# Patient Record
Sex: Male | Born: 1955
Health system: Southern US, Community
[De-identification: ages and names within clinical notes are randomized; demographics above are authoritative.]

## PROBLEM LIST (undated history)

## (undated) DIAGNOSIS — G4730 Sleep apnea, unspecified: Secondary | ICD-10-CM

## (undated) DIAGNOSIS — N529 Male erectile dysfunction, unspecified: Secondary | ICD-10-CM

## (undated) DIAGNOSIS — F419 Anxiety disorder, unspecified: Secondary | ICD-10-CM

## (undated) DIAGNOSIS — I7090 Unspecified atherosclerosis: Secondary | ICD-10-CM

## (undated) DIAGNOSIS — E291 Testicular hypofunction: Secondary | ICD-10-CM

## (undated) DIAGNOSIS — E785 Hyperlipidemia, unspecified: Secondary | ICD-10-CM

## (undated) DIAGNOSIS — E559 Vitamin D deficiency, unspecified: Secondary | ICD-10-CM

## (undated) DIAGNOSIS — G4733 Obstructive sleep apnea (adult) (pediatric): Secondary | ICD-10-CM

## (undated) DIAGNOSIS — E237 Disorder of pituitary gland, unspecified: Secondary | ICD-10-CM

## (undated) DIAGNOSIS — M7989 Other specified soft tissue disorders: Secondary | ICD-10-CM

## (undated) DIAGNOSIS — E119 Type 2 diabetes mellitus without complications: Secondary | ICD-10-CM

## (undated) DIAGNOSIS — M479 Spondylosis, unspecified: Secondary | ICD-10-CM

## (undated) DIAGNOSIS — M549 Dorsalgia, unspecified: Secondary | ICD-10-CM

## (undated) DIAGNOSIS — M5430 Sciatica, unspecified side: Secondary | ICD-10-CM

## (undated) DIAGNOSIS — F329 Major depressive disorder, single episode, unspecified: Secondary | ICD-10-CM

## (undated) DIAGNOSIS — I2510 Atherosclerotic heart disease of native coronary artery without angina pectoris: Secondary | ICD-10-CM

## (undated) DIAGNOSIS — M2550 Pain in unspecified joint: Secondary | ICD-10-CM

## (undated) DIAGNOSIS — Z86718 Personal history of other venous thrombosis and embolism: Secondary | ICD-10-CM

## (undated) DIAGNOSIS — K5900 Constipation, unspecified: Secondary | ICD-10-CM

## (undated) DIAGNOSIS — F32A Depression, unspecified: Secondary | ICD-10-CM

## (undated) DIAGNOSIS — R12 Heartburn: Secondary | ICD-10-CM

## (undated) DIAGNOSIS — J189 Pneumonia, unspecified organism: Secondary | ICD-10-CM

## (undated) DIAGNOSIS — M5412 Radiculopathy, cervical region: Secondary | ICD-10-CM

## (undated) DIAGNOSIS — E669 Obesity, unspecified: Secondary | ICD-10-CM

## (undated) DIAGNOSIS — M1990 Unspecified osteoarthritis, unspecified site: Secondary | ICD-10-CM

## (undated) DIAGNOSIS — Z8701 Personal history of pneumonia (recurrent): Secondary | ICD-10-CM

## (undated) DIAGNOSIS — R0602 Shortness of breath: Secondary | ICD-10-CM

## (undated) DIAGNOSIS — I219 Acute myocardial infarction, unspecified: Secondary | ICD-10-CM

## (undated) DIAGNOSIS — G549 Nerve root and plexus disorder, unspecified: Secondary | ICD-10-CM

## (undated) DIAGNOSIS — K219 Gastro-esophageal reflux disease without esophagitis: Secondary | ICD-10-CM

## (undated) DIAGNOSIS — E230 Hypopituitarism: Secondary | ICD-10-CM

## (undated) DIAGNOSIS — M4800 Spinal stenosis, site unspecified: Secondary | ICD-10-CM

## (undated) HISTORY — DX: Sleep apnea, unspecified: G47.30

## (undated) HISTORY — PX: WISDOM TOOTH EXTRACTION: SHX21

## (undated) HISTORY — DX: Major depressive disorder, single episode, unspecified: F32.9

## (undated) HISTORY — DX: Personal history of other venous thrombosis and embolism: Z86.718

## (undated) HISTORY — DX: Constipation, unspecified: K59.00

## (undated) HISTORY — DX: Hyperlipidemia, unspecified: E78.5

## (undated) HISTORY — DX: Hypopituitarism: E23.0

## (undated) HISTORY — PX: TONSILLECTOMY: SUR1361

## (undated) HISTORY — DX: Shortness of breath: R06.02

## (undated) HISTORY — DX: Unspecified atherosclerosis: I70.90

## (undated) HISTORY — DX: Heartburn: R12

## (undated) HISTORY — DX: Pain in unspecified joint: M25.50

## (undated) HISTORY — DX: Obstructive sleep apnea (adult) (pediatric): G47.33

## (undated) HISTORY — DX: Atherosclerotic heart disease of native coronary artery without angina pectoris: I25.10

## (undated) HISTORY — DX: Personal history of pneumonia (recurrent): Z87.01

## (undated) HISTORY — DX: Male erectile dysfunction, unspecified: N52.9

## (undated) HISTORY — DX: Testicular hypofunction: E29.1

## (undated) HISTORY — DX: Unspecified osteoarthritis, unspecified site: M19.90

## (undated) HISTORY — DX: Dorsalgia, unspecified: M54.9

## (undated) HISTORY — DX: Type 2 diabetes mellitus without complications: E11.9

## (undated) HISTORY — DX: Spinal stenosis, site unspecified: M48.00

## (undated) HISTORY — DX: Depression, unspecified: F32.A

## (undated) HISTORY — DX: Other specified soft tissue disorders: M79.89

## (undated) HISTORY — DX: Vitamin D deficiency, unspecified: E55.9

## (undated) HISTORY — DX: Anxiety disorder, unspecified: F41.9

---

## 2012-04-13 ENCOUNTER — Ambulatory Visit (HOSPITAL_COMMUNITY): Payer: Self-pay | Admitting: Psychology

## 2012-04-13 ENCOUNTER — Encounter (HOSPITAL_COMMUNITY): Payer: Self-pay | Admitting: Psychology

## 2012-04-13 ENCOUNTER — Ambulatory Visit (INDEPENDENT_AMBULATORY_CARE_PROVIDER_SITE_OTHER): Payer: BC Managed Care – PPO | Admitting: Psychology

## 2012-04-13 DIAGNOSIS — F329 Major depressive disorder, single episode, unspecified: Secondary | ICD-10-CM

## 2012-04-13 DIAGNOSIS — F411 Generalized anxiety disorder: Secondary | ICD-10-CM

## 2012-04-13 NOTE — Progress Notes (Signed)
Patient:   Roger Guerra   DOB:   [birth year]  MR Number:  [id number]  Location:  [facility] [address] [place] [address] Dept: [phone]           Date of Service:   04/13/2012  Start Time:   3 PM End Time:   4:15 PM  Provider/Observer:  Hershal Coria PSYD       Billing Code/Service: [id number]  Chief Complaint:     Chief Complaint  Patient presents with  . Anxiety  . Agitation  . Depression  . Stress    Reason for Service:  The patient was self-referred after developing increasing symptoms of anxiety and depression. The patient had been seen in the past when he was living in [place]. He has uncontrolled diabetes, disorder and eating, and generalized anxiety. He has recently been placed on insulin which really bothered him indication of worsening of his type 2 diabetes. The patient also has treatable sleep apneas that with a CPAP device. The patient reports he has been experiencing low energy and other symptoms of depression over the past few months. He reports that in recent triggers was within his ex-wife remarried he brought up a lot of of Ewing's as they have very bad divorce and he had a lot of regrets around breakup of her marriage. The patient has been down here living by himself for quite some time and his weight is going up and down significantly. The patient also was admitted alcoholic but indurated to a 29 years ago and has continued to be an active participant has not had any alcohol for 20 years. The patient describes  Current Status:  The patient describes moderate to significant symptoms of depression, anxiety, appetite changes, and lonely.  Reliability of Information: The information was provided by the patient himself but does appear to be valid there did not appear to be secondary factors or secondary gain contributing her motivating him to provide false  information.  Behavioral Observation: Roger Guerra  presents as a 56 y.o.-year-old Right Caucasian Male who appeared his stated age. his dress was Appropriate and he was Well Groomed and his manners were Appropriate to the situation.  There were not any physical disabilities noted.  he displayed an appropriate level of cooperation and motivation.    Interactions:    Active   Attention:   within normal limits  Memory:   within normal limits  Visuo-spatial:   within normal limits  Speech (Volume):  normal  Speech:   normal pitch and normal volume  Thought Process:  Coherent  Though Content:  WNL  Orientation:   person, place, time/date, situation, day of week and month of year  Judgment:   Good  Planning:   Good  Affect:    Anxious  Mood:    Depressed  Insight:   Good  Intelligence:   high  Marital Status/Living: The patient is divorced. He lives by himself.  Substance Use:  No concerns of substance abuse are reported.  the patient reports that he does have a history of significant alcohol abuse but has been involved in AA for 29 years and has been completely alcohol free. It is very possible that some of his problems with weight are related to eating disorders/abuse in the that may be another form of addictions last addictive behaviors.  Education:   College  Medical History:   Past Medical History  Diagnosis Date  .  Anxiety disorder   . Depression   . H/O blood clots   . High cholesterol   . Diabetes mellitus type II   . Sleep apnea   . Obstructive sleep apnea         Outpatient Encounter Prescriptions as of 04/13/2012  Medication Sig Dispense Refill  . testosterone (ANDROGEL) 50 MG/5GM GEL Place 5 g onto the skin daily.              Sexual History:   History  Sexual Activity  . Sexually Active: No    Abuse/Trauma History: The patient did have a traumatic event related to the divorce from his wife.  Psychiatric History:  The patient has been  treated for issues of depression and anxiety as well as feeding issues and has been active participant in AA for 29 years.  Family Med/Psych History: History reviewed. No pertinent family history.  Risk of Suicide/Violence: virtually non-existent   Impression/DX:  At this point, the patient does have a long history of treatment for anxiety and depression this with Lexapro in the past. He had a very stressful and bad divorce and was seen on an outpatient basis back in 2007. He admits to having suicidal ideation as far back as 2002. I do think he meets the criterion for a anxiety disorder as well as a depressive disorder we will continue look at the issues of further disc make sure that is not of bipolar disorder or a unipolar disorder.  Disposition/Plan:  We will set the patient up for individual psychotherapeutic interventions but also remained observed and whether there may be any need for psychotropic interventions.  Diagnosis:    Axis I:   1. Generalized anxiety disorder   2. Depressive disorder         Axis II: Deferred       Axis IV:  other psychosocial or environmental problems          Axis V:  51-60 moderate symptoms

## 2012-05-02 ENCOUNTER — Ambulatory Visit (HOSPITAL_COMMUNITY): Payer: Self-pay | Admitting: Psychology

## 2013-09-27 HISTORY — PX: BLADDER SURGERY: SHX569

## 2015-06-25 ENCOUNTER — Other Ambulatory Visit: Payer: Self-pay | Admitting: "Endocrinology

## 2015-07-15 LAB — HEMOGLOBIN A1C: HEMOGLOBIN A1C: 6 % (ref 4.0–6.0)

## 2015-07-23 ENCOUNTER — Encounter: Payer: Self-pay | Admitting: *Deleted

## 2015-07-24 ENCOUNTER — Encounter: Payer: Self-pay | Admitting: "Endocrinology

## 2015-07-24 ENCOUNTER — Ambulatory Visit (INDEPENDENT_AMBULATORY_CARE_PROVIDER_SITE_OTHER): Payer: BC Managed Care – PPO | Admitting: "Endocrinology

## 2015-07-24 VITALS — Ht 70.0 in

## 2015-07-24 DIAGNOSIS — E785 Hyperlipidemia, unspecified: Secondary | ICD-10-CM | POA: Insufficient documentation

## 2015-07-24 DIAGNOSIS — IMO0001 Reserved for inherently not codable concepts without codable children: Secondary | ICD-10-CM | POA: Insufficient documentation

## 2015-07-24 DIAGNOSIS — E1165 Type 2 diabetes mellitus with hyperglycemia: Secondary | ICD-10-CM | POA: Diagnosis not present

## 2015-07-24 DIAGNOSIS — E291 Testicular hypofunction: Secondary | ICD-10-CM

## 2015-07-24 MED ORDER — ROSUVASTATIN CALCIUM 5 MG PO TABS: 5.0000 mg | ORAL_TABLET | Freq: Every day | ORAL | Status: DC

## 2015-07-24 NOTE — Progress Notes (Signed)
Subjective:    Patient ID: Roger Guerra, male    DOB: 1956-07-07,    Past Medical History  Diagnosis Date  . Anxiety disorder   . Depression   . H/O blood clots   . High cholesterol   . Diabetes mellitus type II   . Sleep apnea   . Obstructive sleep apnea    Past Surgical History  Procedure Laterality Date  . Tonsillectomy     Social History   Social History  . Marital Status: Unknown    Spouse Name: N/A  . Number of Children: N/A  . Years of Education: N/A   Social History Main Topics  . Smoking status: Former Smoker    Types: Cigarettes  . Smokeless tobacco: Never Used  . Alcohol Use: No  . Drug Use: No  . Sexual Activity: No   Other Topics Concern  . None   Social History Narrative   Outpatient Encounter Prescriptions as of 07/24/2015  Medication Sig  . aspirin 81 MG chewable tablet Chew 81 mg by mouth daily.  . Dulaglutide (TRULICITY) 3.41 DQ/2.2WL SOPN Inject into the skin once a week.  Marland Kitchen ibuprofen (ADVIL,MOTRIN) 800 MG tablet Take 800 mg by mouth every 8 (eight) hours as needed.  . metFORMIN (GLUCOPHAGE) 1000 MG tablet Take 1,000 mg by mouth 2 (two) times daily with a meal.  . tadalafil (CIALIS) 10 MG tablet Take 10 mg by mouth daily as needed for erectile dysfunction.  . Testosterone Cypionate & Prop 200-20 MG/ML SOLN Inject 100 mg into the muscle as directed.  Every 2 weeks   . insulin detemir (LEVEMIR) 100 UNIT/ML injection Inject into the skin at bedtime.  . rosuvastatin (CRESTOR) 5 MG tablet Take 1 tablet (5 mg total) by mouth at bedtime.  . tamsulosin (FLOMAX) 0.4 MG CAPS capsule Take 0.4 mg by mouth daily.  . [DISCONTINUED] pravastatin (PRAVACHOL) 20 MG tablet Take 20 mg by mouth daily.  . [DISCONTINUED] TRULICITY 1.5 NL/8.9QJ SOPN INJECT 1.5MG S SUBCUTANEOUSLY EVERY WEEK   No facility-administered encounter medications on file as of 07/24/2015.   ALLERGIES: No Known Allergies VACCINATION STATUS:  There is no immunization history on file  for this patient.  Diabetes He presents for his follow-up diabetic visit. He has type 2 diabetes mellitus. Onset time: he was diagnosed at approximate age of 89 yreas. His disease course has been improving. There are no hypoglycemic associated symptoms. Pertinent negatives for hypoglycemia include no confusion, headaches, pallor or seizures. There are no diabetic associated symptoms. Pertinent negatives for diabetes include no chest pain, no fatigue, no polydipsia, no polyphagia, no polyuria and no weakness. There are no hypoglycemic complications. Symptoms are improving. There are no diabetic complications. Risk factors for coronary artery disease include family history, diabetes mellitus, hypertension, male sex and obesity. Current diabetic treatment includes oral agent (monotherapy). He is compliant with treatment most of the time. His weight is stable. He has not had a previous visit with a dietitian. He participates in exercise intermittently.  Hyperlipidemia This is a chronic problem. The current episode started more than 1 year ago. There are no known factors aggravating his hyperlipidemia. Pertinent negatives include no chest pain, myalgias or shortness of breath. Risk factors for coronary artery disease include family history, dyslipidemia, diabetes mellitus, male sex, obesity and hypertension.  Other This is a chronic (hypogonadism) problem. The current episode started more than 1 year ago. Pertinent negatives include no abdominal pain, chest pain, coughing, fatigue, headaches, myalgias, nausea, neck pain, numbness, rash,  vomiting or weakness. Treatments tried: testosterone injections. The treatment provided moderate relief.     Review of Systems  Constitutional: Negative for fatigue and unexpected weight change.  HENT: Negative for dental problem, mouth sores and trouble swallowing.   Eyes: Negative for visual disturbance.  Respiratory: Negative for cough, choking, chest tightness,  shortness of breath and wheezing.   Cardiovascular: Negative for chest pain, palpitations and leg swelling.  Gastrointestinal: Negative for nausea, vomiting, abdominal pain, diarrhea, constipation and abdominal distention.  Endocrine: Negative for polydipsia, polyphagia and polyuria.  Genitourinary: Negative for dysuria, urgency, hematuria and flank pain.  Musculoskeletal: Negative for myalgias, back pain, gait problem and neck pain.  Skin: Negative for pallor, rash and wound.  Neurological: Negative for seizures, syncope, weakness, numbness and headaches.  Psychiatric/Behavioral: Negative.  Negative for confusion and dysphoric mood.    Objective:    Ht 5\' 10"  (1.778 m)  Wt Readings from Last 3 Encounters:  No data found for Wt    Physical Exam  Constitutional: He is oriented to person, place, and time. He appears well-developed and well-nourished. He is cooperative. No distress.  HENT:  Head: Normocephalic and atraumatic.  Eyes: EOM are normal.  Neck: Normal range of motion. Neck supple. No tracheal deviation present. No thyromegaly present.  Cardiovascular: Normal rate, S1 normal, S2 normal and normal heart sounds.  Exam reveals no gallop.   No murmur heard. Pulses:      Dorsalis pedis pulses are 1+ on the right side, and 1+ on the left side.       Posterior tibial pulses are 1+ on the right side, and 1+ on the left side.  Pulmonary/Chest: Breath sounds normal. No respiratory distress. He has no wheezes.  Abdominal: Soft. Bowel sounds are normal. He exhibits no distension. There is no tenderness. There is no guarding and no CVA tenderness.  Musculoskeletal: He exhibits no edema.       Right shoulder: He exhibits no swelling and no deformity.  Neurological: He is alert and oriented to person, place, and time. He has normal strength and normal reflexes. No cranial nerve deficit or sensory deficit. Gait normal.  Skin: Skin is warm and dry. No rash noted. No cyanosis. Nails show no  clubbing.  Psychiatric: He has a normal mood and affect. His speech is normal and behavior is normal. Judgment and thought content normal. Cognition and memory are normal.       Assessment & Plan:   1. Uncontrolled type 2 diabetes mellitus without complication, without long-term current use of insulin ([place])   Patient came with stable a1c of 6 %.   Recent labs reviewed.  - I have re-counseled the patient on diet management and weight loss  by adopting a carbohydrate restricted / protein rich  Diet.  - Suggestion is made for patient to avoid simple carbohydrates   from their diet including Cakes , Desserts, Ice Cream,  Soda (  diet and regular) , Sweet Tea , Candies,  Chips, Cookies, Artificial Sweeteners,   and "Sugar-free" Products .  This will help patient to have stable blood glucose profile and potentially avoid unintended  Weight gain.  - Patient is advised to stick to a routine mealtimes to eat 3 meals  a day and avoid unnecessary snacks ( to snack only to correct hypoglycemia).  - I have approached patient with the following individualized plan to manage diabetes and patient agrees.  -He will continue to benefit  From MTF 1gm po BID, and Trulicity 0.75mg   weekly for now. -He did not tolerate Invokana .    - Patient will be considered for incretin therapy as appropriate next visit. - Patient specific target  for A1c; LDL, HDL, Triglycerides, and  Waist Circumference were discussed in detail.  2. Male hypogonadism- responding well to T replacement. T level is 626. I will continue Testosterone 100mg  IM every other week,  - Testosterone - CBC with Differential/Platelet  3) BP/HTN: Controlled . Continue current medications . 4) Lipids/HPL: He has multiple premature cardiac death i his family. We discussed risk and benefits of statins. He did have statin intolerance in the past. I advised and started him on Crestor 2.5mg  po qhs, advance to 5 mg po qhs if tolerated.  5)   Weight/Diet: CDE consult in progress, exercise, and carbohydrates information provided.  6) Chronic Care/Health Maintenance:  -Patient is encouraged to see his ophthalmologist at least yearly or according to recommendations, and advised to  stay away from smoking. I have recommended yearly flu vaccine and pneumonia vaccination at least every 5 years; moderate intensity exercise for up to 150 minutes weekly; and  sleep for at least 7 hours a day.  I advised patient to maintain close follow up with their PCP for primary care needs.  Patient is asked to bring meter and  blood glucose logs during their next visit.   Follow up plan: Return in about 6 months (around 01/22/2016) for diabetes, hypogonadism.  Glade Lloyd, MD Phone: [phone]  Fax: [phone]   07/24/2015, 7:45 PM

## 2015-07-28 ENCOUNTER — Encounter: Payer: Self-pay | Admitting: *Deleted

## 2015-07-28 ENCOUNTER — Encounter: Payer: Self-pay | Admitting: Cardiology

## 2015-07-28 ENCOUNTER — Ambulatory Visit (INDEPENDENT_AMBULATORY_CARE_PROVIDER_SITE_OTHER): Payer: BC Managed Care – PPO | Admitting: Cardiology

## 2015-07-28 VITALS — BP 111/77 | HR 77 | Ht 71.0 in | Wt 243.2 lb

## 2015-07-28 DIAGNOSIS — E782 Mixed hyperlipidemia: Secondary | ICD-10-CM

## 2015-07-28 DIAGNOSIS — I2510 Atherosclerotic heart disease of native coronary artery without angina pectoris: Secondary | ICD-10-CM

## 2015-07-28 DIAGNOSIS — Z23 Encounter for immunization: Secondary | ICD-10-CM

## 2015-07-28 DIAGNOSIS — E1159 Type 2 diabetes mellitus with other circulatory complications: Secondary | ICD-10-CM | POA: Diagnosis not present

## 2015-07-28 DIAGNOSIS — Z136 Encounter for screening for cardiovascular disorders: Secondary | ICD-10-CM | POA: Diagnosis not present

## 2015-07-28 DIAGNOSIS — N529 Male erectile dysfunction, unspecified: Secondary | ICD-10-CM

## 2015-07-28 NOTE — Patient Instructions (Signed)
Your physician recommends that you continue on your current medications as directed. Please refer to the Current Medication list given to you today. Your physician has requested that you have en exercise stress myoview. For further information please visit [email or web address]. Please follow instruction sheet, as given.  We will call you with your results. 

## 2015-07-28 NOTE — Progress Notes (Signed)
Cardiology Office Note  Date: 07/28/2015   ID: Roger Guerra, DOB 1955-10-01, MRN [id number]  PCP: Celedonio Savage, MD  Referring provider: Clyde Lundborg, MD Consulting Cardiologist: Rozann Lesches, MD   Chief Complaint  Patient presents with  . Cardiac evaluation  . Coronary calcification by chest CT    History of Present Illness: Roger Guerra is a 59 y.o. male referred for cardiology consultation by Dr. Exie Parody. Records indicate that he recently had a chest CT scan in October for follow-up of a pulmonary nodule. He has a 3 mm right middle lobe nodule that is felt to be benign, unchanged compared to September of last year. It was incidentally noted that he had evidence of coronary artery calcifications, and is referred for further assessment.  We discussed his history today. He has type 2 diabetes mellitus, currently follows with Dr.Nida, has also had evaluation at [facility]. Sounds like he has had fairly good control, has been able to lose weight, and also exercises. He was just recently prescribed low-dose Crestor for more aggressive management of his lipids, has not started the medication as yet.  He works at [employer], Microbiologist. He does try to keep up a regular exercise plan, goes to an evening swimming class, also walks. He does not report any angina symptoms, no breathlessness beyond NYHA class II with typical activities.   He has not undergone any prior ischemic cardiac testing. States that he had an echocardiogram done back in 2008 secondary to an abnormal ECG. Tracing today shows sinus rhythm at 80 bpm with borderline low voltage and nonspecific T-wave changes.   Past Medical History  Diagnosis Date  . Anxiety disorder   . Depression   . History of DVT of lower extremity     Right leg 2002   . Hyperlipidemia   . Type 2 diabetes mellitus ([place])   . Sleep apnea     On CPAP   . Obstructive sleep apnea   . Erectile dysfunction     Past Surgical  History  Procedure Laterality Date  . Tonsillectomy      Current Outpatient Prescriptions  Medication Sig Dispense Refill  . aspirin 81 MG tablet Take 81 mg by mouth daily.    . Dulaglutide (TRULICITY) 2.68 TM/1.9QQ SOPN Inject into the skin once a week.    . fexofenadine (ALLEGRA) 180 MG tablet Take 1 tablet by mouth daily.    Marland Kitchen ibuprofen (ADVIL,MOTRIN) 800 MG tablet Take 800 mg by mouth every 8 (eight) hours as needed.    . metFORMIN (GLUCOPHAGE) 1000 MG tablet Take 1,000 mg by mouth 2 (two) times daily with a meal.    . Testosterone Cypionate & Prop 200-20 MG/ML SOLN Inject 100 mg into the muscle as directed.  Every 2 weeks     . Triptorelin Pamoate (TRELSTAR) 3.75 MG injection Inject 3.75 mg into the muscle as needed.    . rosuvastatin (CRESTOR) 5 MG tablet Take 1 tablet (5 mg total) by mouth at bedtime. (Patient not taking: Reported on 07/28/2015) 30 tablet 3   No current facility-administered medications for this visit.    Allergies:  Invokana   Social History: The patient  reports that he has quit smoking. His smoking use included Cigarettes. He has never used smokeless tobacco. He reports that he does not drink alcohol or use illicit drugs.   Family History: The patient's family history includes Diabetes in his mother and other; Heart disease in his father; Lung cancer in  his father.   ROS:  Please see the history of present illness. Otherwise, complete review of systems is positive for leg cramps at nighttime, no claudication with activity.  All other systems are reviewed and negative.   Physical Exam: VS:  BP 111/77 mmHg  Pulse 77  Ht 5\' 11"  (1.803 m)  Wt 243 lb 3.2 oz (110.315 kg)  BMI 33.93 kg/m2  SpO2 96%, BMI Body mass index is 33.93 kg/(m^2).  Wt Readings from Last 3 Encounters:  07/28/15 243 lb 3.2 oz (110.315 kg)     General: Overweight male, appears comfortable at rest. HEENT: Conjunctiva and lids normal, oropharynx clear. Neck: Supple, no elevated JVP or  carotid bruits, no thyromegaly. Lungs: Clear to auscultation, nonlabored breathing at rest. Cardiac: Regular rate and rhythm, no S3 or significant systolic murmur, no pericardial rub. Abdomen: Soft, nontender, bowel sounds present, no guarding or rebound. Extremities: No pitting edema, distal pulses 2+. Skin: Warm and dry. Musculoskeletal: No kyphosis. Neuropsychiatric: Alert and oriented x3, affect grossly appropriate.   ECG: ECG is ordered today.   Recent Labwork:  May 2016: Hemoglobin 15.2, platelets 232, BUN 14, creatinine 0.6, AST 15, ALT 17, potassium 4.4, cholesterol 216, triglycerides 147, HDL 32, LDL 155  Assessment and Plan:  1. Coronary artery calcification documented by recent chest CT imaging. Baseline cardiac risk factors include type 2 diabetes mellitus, hyperlipidemia with LDL 155, and family history of CAD in his father. In addition he has erectile dysfunction and obstructive sleep apnea increasing risk for cardiovascular disease. He does not report any obvious angina symptoms in the setting of diabetes mellitus and his ECG is nonspecific at baseline. We have discussed plan to proceed with an exercise Cardiolite for more objective evaluation of ischemic burden. If low risk results are obtained, would recommend aggressive risk factor modification strategies, and close follow-up with primary care and endocrinology. If on the other hand he has higher risk features identified on testing, we can bring him back and discuss further invasive cardiac evaluation.  2. Type 2 diabetes mellitus, currently on Trulicity and metformin. Keep follow with Dr. Dorris Fetch. Encouraged diet and exercise.  3. Hyperlipidemia, recent LDL 155. Agree with initiation of low-dose Crestor.  4. Erectile dysfunction, followed by Dr. Exie Parody.  Current medicines were reviewed with the patient today.   Orders Placed This Encounter  Procedures  . NM Myocar Multi W/Spect W/Wall Motion / EF  . Myocardial  Perfusion Imaging  . EKG 12-Lead    Disposition: Call with results.   Signed, Satira Sark, MD, [facility] 07/28/2015 2:20 PM    [facility] [place], [place], [place] [address] Phone: [phone]; Fax: [phone]

## 2015-08-05 ENCOUNTER — Encounter (HOSPITAL_COMMUNITY)
Admission: RE | Admit: 2015-08-05 | Discharge: 2015-08-05 | Disposition: A | Discharge status: Home / Self Care | Payer: BC Managed Care – PPO | Source: Ambulatory Visit | Attending: Cardiology | Admitting: Cardiology

## 2015-08-05 ENCOUNTER — Ambulatory Visit (HOSPITAL_COMMUNITY): Admission: RE | Admit: 2015-08-05 | Payer: Self-pay | Source: Ambulatory Visit

## 2015-08-05 ENCOUNTER — Encounter (HOSPITAL_COMMUNITY): Payer: Self-pay

## 2015-08-05 DIAGNOSIS — I2510 Atherosclerotic heart disease of native coronary artery without angina pectoris: Secondary | ICD-10-CM | POA: Insufficient documentation

## 2015-08-05 LAB — NM MYOCAR MULTI W/SPECT W/WALL MOTION / EF
CHL CUP NUCLEAR SDS: 2
CHL CUP RESTING HR STRESS: 70 {beats}/min
CHL RATE OF PERCEIVED EXERTION: 16
CSEPEDS: 7 s
CSEPEW: 10 METS
CSEPHR: 103 %
Exercise duration (min): 8 min
LHR: 0.32
LV dias vol: 86 mL
LV sys vol: 36 mL
MPHR: 161 {beats}/min
Peak HR: 166 {beats}/min
SRS: 2
SSS: 4
TID: 1.03

## 2015-08-05 MED ORDER — TECHNETIUM TC 99M SESTAMIBI GENERIC - CARDIOLITE
30.0000 | Freq: Once | INTRAVENOUS | Status: AC | PRN
Start: 2015-08-05 — End: 2015-08-05
  Administered 2015-08-05: 30.7 via INTRAVENOUS

## 2015-08-05 MED ORDER — SODIUM CHLORIDE 0.9 % IJ SOLN
INTRAMUSCULAR | Status: AC
  Administered 2015-08-05: 10 mL via INTRAVENOUS
  Filled 2015-08-05: qty 3

## 2015-08-05 MED ORDER — TECHNETIUM TC 99M SESTAMIBI - CARDIOLITE
10.0000 | Freq: Once | INTRAVENOUS | Status: AC | PRN
  Administered 2015-08-05: 08:00:00 10 via INTRAVENOUS

## 2015-08-06 ENCOUNTER — Telehealth: Payer: Self-pay | Admitting: *Deleted

## 2015-08-06 NOTE — Telephone Encounter (Signed)
Patient informed and verbalized understanding of plan. 

## 2015-08-06 NOTE — Telephone Encounter (Signed)
-----   Message from Satira Sark, MD sent at 08/06/2015  8:11 AM EST ----- Reviewed report. This is a low risk stress test as outlined with no evidence of ischemic defects. This would argue that although he does have coronary calcification present, it is not obstructive at this point. In the absence of symptoms, recommendation is aggressive risk factor modification including treatment of diabetes mellitus and hyperlipidemia. He should keep close follow-up with his PCP and endocrinology.

## 2016-01-12 ENCOUNTER — Ambulatory Visit: Payer: BC Managed Care – PPO | Admitting: "Endocrinology

## 2016-01-14 ENCOUNTER — Ambulatory Visit: Payer: BC Managed Care – PPO | Admitting: "Endocrinology

## 2016-05-12 ENCOUNTER — Telehealth: Payer: Self-pay | Admitting: Orthopaedic Surgery

## 2016-05-12 NOTE — Telephone Encounter (Signed)
Mr. Roger Guerra was referred to our office by Dr. Wende Neighbors back in June. I spoke with Mr. Roger Guerra on March 18, 2016 and offered him an appointment but he declined. He stated that he had told Dr. [place] Crane that he wanted to see a specialist in [place]. I faxed this information back to Dr. Juel Burrow office on 6/22.

## 2016-06-29 IMAGING — NM NM MYOCAR MULTI W/SPECT W/WALL MOTION & EF
2 series · 12 of 12 positions shown · non-contrast
Comparison: none

[Series 1: rest · 8.28mm/px · 6 of 64 frames shown]
[frame 6/64]
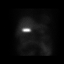
[frame 16/64]
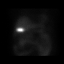
[frame 27/64]
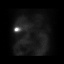
[frame 38/64]
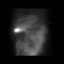
[frame 48/64]
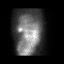
[frame 59/64]
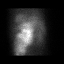

[Series 2: stress gated · 8.28mm/px · 6 of 64 frames shown]
[frame 6/64]
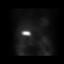
[frame 16/64]
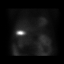
[frame 27/64]
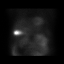
[frame 38/64]
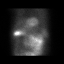
[frame 48/64]
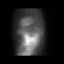
[frame 59/64]
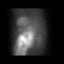

[12 of 12 positions shown; findings below may reference images not displayed]

Canned report from images found in remote index.

Refer to host system for actual result text.

## 2016-10-05 ENCOUNTER — Telehealth: Payer: Self-pay

## 2016-10-05 NOTE — Telephone Encounter (Signed)
LMOM to call.

## 2016-10-05 NOTE — Telephone Encounter (Signed)
[phone] PATIENT RECEIVED LETTER TO SCHEDULE TCS

## 2016-10-07 ENCOUNTER — Telehealth: Payer: Self-pay

## 2016-10-07 NOTE — Telephone Encounter (Signed)
Triaged today. Pt wants to have colonoscopy in March and I will call when I get that schedule.

## 2016-10-08 NOTE — Telephone Encounter (Signed)
OK to schedule.  Make sure endo aware of sleep apnea.   Day of prep: metformin 1/2 dose

## 2016-10-08 NOTE — Telephone Encounter (Signed)
Gastroenterology Pre-Procedure Review  Request Date: 10/07/2016 Requesting Physician: Dr. Wende Neighbors  PATIENT REVIEW QUESTIONS: The patient responded to the following health history questions as indicated:    LAST COLONOSCOPY 10 YEARS AGO IN [place] SAID HE HAD ONE LITTLE TINY POLYP  1. Diabetes Melitis: YES 2. Joint replacements in the past 12 months: no 3. Major health problems in the past 3 months: no 4. Has an artificial valve or MVP: no 5. Has a defibrillator: no 6. Has been advised in past to take antibiotics in advance of a procedure like teeth cleaning: no 7. Family history of colon cancer: no  8. Alcohol Use: no 9. History of sleep apnea: yes 10. History of coronary artery or other vascular stents placed within the last 12 months: no    MEDICATIONS & ALLERGIES:    Patient reports the following regarding taking any blood thinners:   Plavix? no Aspirin? yes Coumadin? no Brilinta? no Xarelto? no Eliquis? no Pradaxa? no Savaysa? no Effient? no  Patient confirms/reports the following medications:  Current Outpatient Prescriptions  Medication Sig Dispense Refill  . aspirin 81 MG tablet Take 81 mg by mouth daily.    . cetirizine (ZYRTEC) 10 MG tablet Take 10 mg by mouth daily.    Marland Kitchen co-enzyme Q-10 30 MG capsule Take 30 mg by mouth 3 (three) times daily.    . Dulaglutide (TRULICITY) A999333 0000000 SOPN Inject into the skin once a week.    . fexofenadine (ALLEGRA) 180 MG tablet Take 1 tablet by mouth daily.    Marland Kitchen ibuprofen (ADVIL,MOTRIN) 800 MG tablet Take 800 mg by mouth every 8 (eight) hours as needed.    . metFORMIN (GLUMETZA) 1000 MG (MOD) 24 hr tablet Take 1,000 mg by mouth daily with breakfast.    . Testosterone Cypionate & Prop 200-20 MG/ML SOLN Inject 100 mg into the muscle as directed.  Every 2 weeks     . Triptorelin Pamoate (TRELSTAR) 3.75 MG injection Inject 3.75 mg into the muscle as needed.    . rosuvastatin (CRESTOR) 5 MG tablet Take 1 tablet (5 mg total)  by mouth at bedtime. (Patient not taking: Reported on 10/07/2016) 30 tablet 3   No current facility-administered medications for this visit.     Patient confirms/reports the following allergies:  Allergies  Allergen Reactions  . Invokana [Canagliflozin] Other (See Comments)    Yeast infection    No orders of the defined types were placed in this encounter.   AUTHORIZATION INFORMATION Primary Insurance:  ID #:   Group #:  Pre-Cert / Auth required: Pre-Cert / Auth #:   Secondary Insurance:   ID #:  Group #:  Pre-Cert / Auth required:  Pre-Cert / Auth #:   SCHEDULE INFORMATION: Procedure has been scheduled as follows:  Date: Time:   Location:   This Gastroenterology Pre-Precedure Review Form is being routed to the following provider(s): R. Garfield Cornea, MD

## 2016-10-11 NOTE — Telephone Encounter (Signed)
Pt would like to have 2nd week in March. I will call him when I get the March schedule.

## 2016-10-28 ENCOUNTER — Other Ambulatory Visit: Payer: Self-pay

## 2016-10-28 DIAGNOSIS — Z1211 Encounter for screening for malignant neoplasm of colon: Secondary | ICD-10-CM

## 2016-11-01 MED ORDER — NA SULFATE-K SULFATE-MG SULF 17.5-3.13-1.6 GM/177ML PO SOLN: 1.0000 | ORAL | 0 refills | Status: DC

## 2016-11-01 NOTE — Telephone Encounter (Signed)
Rx sent to the pharmacy and instructions mailed to pt.  

## 2016-11-01 NOTE — Telephone Encounter (Signed)
Pt has been scheduled for 12/10/2016 at 8:30 Am with Dr. Gala Romney for the colonoscopy.  Pt is aware to take 1/2 dose of Metformin on the date of prep and to take his settings for C-PAP.

## 2016-11-01 NOTE — Addendum Note (Signed)
Addended by: Everardo All on: 11/01/2016 09:25 AM   Modules accepted: Orders

## 2016-11-19 ENCOUNTER — Other Ambulatory Visit: Payer: Self-pay | Admitting: Internal Medicine

## 2016-11-24 NOTE — Telephone Encounter (Signed)
NO PA needed for TCS.ref# Roger Guerra

## 2016-12-10 ENCOUNTER — Encounter (HOSPITAL_COMMUNITY): Payer: Self-pay | Admitting: *Deleted

## 2016-12-10 ENCOUNTER — Ambulatory Visit (HOSPITAL_COMMUNITY)
Admission: RE | Admit: 2016-12-10 | Discharge: 2016-12-10 | Disposition: A | Discharge status: Home / Self Care | Payer: BC Managed Care – PPO | Source: Ambulatory Visit | Attending: Internal Medicine | Admitting: Internal Medicine

## 2016-12-10 ENCOUNTER — Encounter (HOSPITAL_COMMUNITY)
Admission: RE | Disposition: A | Discharge status: Home / Self Care | Payer: Self-pay | Source: Ambulatory Visit | Attending: Internal Medicine

## 2016-12-10 DIAGNOSIS — G4733 Obstructive sleep apnea (adult) (pediatric): Secondary | ICD-10-CM | POA: Insufficient documentation

## 2016-12-10 DIAGNOSIS — F329 Major depressive disorder, single episode, unspecified: Secondary | ICD-10-CM | POA: Insufficient documentation

## 2016-12-10 DIAGNOSIS — Z79899 Other long term (current) drug therapy: Secondary | ICD-10-CM | POA: Diagnosis not present

## 2016-12-10 DIAGNOSIS — Z1211 Encounter for screening for malignant neoplasm of colon: Secondary | ICD-10-CM | POA: Diagnosis not present

## 2016-12-10 DIAGNOSIS — E119 Type 2 diabetes mellitus without complications: Secondary | ICD-10-CM | POA: Diagnosis not present

## 2016-12-10 DIAGNOSIS — Z7984 Long term (current) use of oral hypoglycemic drugs: Secondary | ICD-10-CM | POA: Diagnosis not present

## 2016-12-10 DIAGNOSIS — F419 Anxiety disorder, unspecified: Secondary | ICD-10-CM | POA: Diagnosis not present

## 2016-12-10 DIAGNOSIS — E785 Hyperlipidemia, unspecified: Secondary | ICD-10-CM | POA: Insufficient documentation

## 2016-12-10 DIAGNOSIS — Z7982 Long term (current) use of aspirin: Secondary | ICD-10-CM | POA: Diagnosis not present

## 2016-12-10 DIAGNOSIS — Z1212 Encounter for screening for malignant neoplasm of rectum: Secondary | ICD-10-CM | POA: Diagnosis not present

## 2016-12-10 DIAGNOSIS — Z86010 Personal history of colonic polyps: Secondary | ICD-10-CM | POA: Insufficient documentation

## 2016-12-10 DIAGNOSIS — D123 Benign neoplasm of transverse colon: Secondary | ICD-10-CM | POA: Diagnosis not present

## 2016-12-10 DIAGNOSIS — Z87891 Personal history of nicotine dependence: Secondary | ICD-10-CM | POA: Diagnosis not present

## 2016-12-10 HISTORY — PX: COLONOSCOPY: SHX5424

## 2016-12-10 HISTORY — PX: POLYPECTOMY: SHX5525

## 2016-12-10 LAB — GLUCOSE, CAPILLARY: GLUCOSE-CAPILLARY: 138 mg/dL — AB (ref 65–99)

## 2016-12-10 SURGERY — COLONOSCOPY
Anesthesia: Moderate Sedation

## 2016-12-10 MED ORDER — MIDAZOLAM HCL 5 MG/5ML IJ SOLN
INTRAMUSCULAR | Status: AC
  Filled 2016-12-10: qty 10

## 2016-12-10 MED ORDER — ONDANSETRON HCL 4 MG/2ML IJ SOLN
INTRAMUSCULAR | Status: AC
  Filled 2016-12-10: qty 2

## 2016-12-10 MED ORDER — ONDANSETRON HCL 4 MG/2ML IJ SOLN
INTRAMUSCULAR | Status: DC | PRN
  Administered 2016-12-10: 4 mg via INTRAVENOUS

## 2016-12-10 MED ORDER — SODIUM CHLORIDE 0.9 % IV SOLN
INTRAVENOUS | Status: DC
  Administered 2016-12-10: 1000 mL via INTRAVENOUS

## 2016-12-10 MED ORDER — MIDAZOLAM HCL 5 MG/5ML IJ SOLN
INTRAMUSCULAR | Status: DC | PRN
  Administered 2016-12-10: 2 mg via INTRAVENOUS
  Administered 2016-12-10 (×2): 1 mg via INTRAVENOUS

## 2016-12-10 MED ORDER — STERILE WATER FOR IRRIGATION IR SOLN
Status: DC | PRN
  Administered 2016-12-10: 09:00:00

## 2016-12-10 MED ORDER — MEPERIDINE HCL 100 MG/ML IJ SOLN
INTRAMUSCULAR | Status: AC
  Filled 2016-12-10: qty 2

## 2016-12-10 MED ORDER — MEPERIDINE HCL 100 MG/ML IJ SOLN
INTRAMUSCULAR | Status: DC | PRN
  Administered 2016-12-10: 25 mg via INTRAVENOUS
  Administered 2016-12-10: 50 mg via INTRAVENOUS

## 2016-12-10 NOTE — Op Note (Signed)
[facility] Patient Name: Roger Guerra Procedure Date: 12/10/2016 8:36 AM MRN: [id number] Date of Birth: Sep 03, 1956 Attending MD: Norvel Richards , MD CSN: [id number] Age: 61 Admit Type: Outpatient Procedure:                Colonoscopy Indications:              Screening for colorectal malignant neoplasm Providers:                Norvel Richards, MD, Lurline Del, RN, Bonnetta Barry, Technician Referring MD:              Medicines:                Midazolam 4 mg IV, Meperidine 75 mg IV Complications:            No immediate complications. Estimated Blood Loss:     Estimated blood loss was minimal. Procedure:                Pre-Anesthesia Assessment:                           - Prior to the procedure, a History and Physical                            was performed, and patient medications and                            allergies were reviewed. The patient's tolerance of                            previous anesthesia was also reviewed. The risks                            and benefits of the procedure and the sedation                            options and risks were discussed with the patient.                            All questions were answered, and informed consent                            was obtained. Prior Anticoagulants: The patient has                            taken no previous anticoagulant or antiplatelet                            agents. ASA Grade Assessment: II - A patient with                            mild systemic disease. After reviewing the risks  and benefits, the patient was deemed in                            satisfactory condition to undergo the procedure.                           After obtaining informed consent, the colonoscope                            was passed under direct vision. Throughout the                            procedure, the patient's blood pressure, pulse, and                  oxygen saturations were monitored continuously. The                            EC-[id number] ([id number]) scope was introduced through                            the anus and advanced to the the cecum, identified                            by appendiceal orifice and ileocecal valve. The                            colonoscopy was performed without difficulty. The                            patient tolerated the procedure well. The quality                            of the bowel preparation was adequate. The entire                            colon was well visualized. The ileocecal valve,                            appendiceal orifice, and rectum were photographed. Scope In: 8:48:13 AM Scope Out: 9:10:26 AM Scope Withdrawal Time: 0 hours 15 minutes 21 seconds  Total Procedure Duration: 0 hours 22 minutes 13 seconds  Findings:      The perianal and digital rectal examinations were normal.      A 5 mm polyp was found in the hepatic flexure. The polyp was sessile.       The polyp was removed with a cold snare. Resection and retrieval were       complete. Estimated blood loss was minimal. Somewhat of a redundant       colon. Impression:               - One 5 mm polyp at the hepatic flexure, removed                            with a cold snare. Resected and retrieved. Moderate Sedation:  Moderate (conscious) sedation was administered by the endoscopy nurse       and supervised by the endoscopist. The following parameters were       monitored: oxygen saturation, heart rate, blood pressure, respiratory       rate, EKG, adequacy of pulmonary ventilation, and response to care.       Total physician intraservice time was 32 minutes. Recommendation:           - Repeat colonoscopy date to be determined after                            pending pathology results are reviewed for                            surveillance.                           - Return to GI office (date not yet  determined).                           - Patient has a contact number available for                            emergencies. The signs and symptoms of potential                            delayed complications were discussed with the                            patient. Return to normal activities tomorrow.                            Written discharge instructions were provided to the                            patient.                           - Resume previous diet. Procedure Code(s):        --- Professional ---                           [phone], Colonoscopy, flexible; with removal of                            tumor(s), polyp(s), or other lesion(s) by snare                            technique                           99152, Moderate sedation services provided by the                            same physician or other qualified health care  professional performing the diagnostic or                            therapeutic service that the sedation supports,                            requiring the presence of an independent trained                            observer to assist in the monitoring of the                            patient's level of consciousness and physiological                            status; initial 15 minutes of intraservice time,                            patient age 61 years or older                           [id number], Moderate sedation services; each additional                            15 minutes intraservice time Diagnosis Code(s):        --- Professional ---                           Z12.11, Encounter for screening for malignant                            neoplasm of colon                           D12.3, Benign neoplasm of transverse colon (hepatic                            flexure or splenic flexure) CPT copyright 2016 American Medical Association. All rights reserved. The codes documented in this report are preliminary and upon coder  review may  be revised to meet current compliance requirements. Cristopher Estimable. Gabriele Loveland, MD Norvel Richards, MD 12/10/2016 9:23:09 AM This report has been signed electronically. Number of Addenda: 0

## 2016-12-10 NOTE — Discharge Instructions (Signed)
°Colonoscopy °Discharge Instructions ° °Read the instructions outlined below and refer to this sheet in the next few weeks. These discharge instructions provide you with general information on caring for yourself after you leave the hospital. Your doctor may also give you specific instructions. While your treatment has been planned according to the most current medical practices available, unavoidable complications occasionally occur. If you have any problems or questions after discharge, call Dr. Rourk at [phone]. °ACTIVITY °· You may resume your regular activity, but move at a slower pace for the next 24 hours.  °· Take frequent rest periods for the next 24 hours.  °· Walking will help get rid of the air and reduce the bloated feeling in your belly (abdomen).  °· No driving for 24 hours (because of the medicine (anesthesia) used during the test).   °· Do not sign any important legal documents or operate any machinery for 24 hours (because of the anesthesia used during the test).  °NUTRITION °· Drink plenty of fluids.  °· You may resume your normal diet as instructed by your doctor.  °· Begin with a light meal and progress to your normal diet. Heavy or fried foods are harder to digest and may make you feel sick to your stomach (nauseated).  °· Avoid alcoholic beverages for 24 hours or as instructed.  °MEDICATIONS °· You may resume your normal medications unless your doctor tells you otherwise.  °WHAT YOU CAN EXPECT TODAY °· Some feelings of bloating in the abdomen.  °· Passage of more gas than usual.  °· Spotting of blood in your stool or on the toilet paper.  °IF YOU HAD POLYPS REMOVED DURING THE COLONOSCOPY: °· No aspirin products for 7 days or as instructed.  °· No alcohol for 7 days or as instructed.  °· Eat a soft diet for the next 24 hours.  °FINDING OUT THE RESULTS OF YOUR TEST °Not all test results are available during your visit. If your test results are not back during the visit, make an appointment  with your caregiver to find out the results. Do not assume everything is normal if you have not heard from your caregiver or the medical facility. It is important for you to follow up on all of your test results.  °SEEK IMMEDIATE MEDICAL ATTENTION IF: °· You have more than a spotting of blood in your stool.  °· Your belly is swollen (abdominal distention).  °· You are nauseated or vomiting.  °· You have a temperature over 101.  °· You have abdominal pain or discomfort that is severe or gets worse throughout the day.  ° ° °Colon polyp information provided ° ° °Further recommendations to follow pending review of pathology report ° ° °Colon Polyps °Polyps are tissue growths inside the body. Polyps can grow in many places, including the large intestine (colon). A polyp may be a round bump or a mushroom-shaped growth. You could have one polyp or several. °Most colon polyps are noncancerous (benign). However, some colon polyps can become cancerous over time. °What are the causes? °The exact cause of colon polyps is not known. °What increases the risk? °This condition is more likely to develop in people who: °· Have a family history of colon cancer or colon polyps. °· Are older than 50 or older than 45 if they are African American. °· Have inflammatory bowel disease, such as ulcerative colitis or Crohn disease. °· Are overweight. °· Smoke cigarettes. °· Do not get enough exercise. °· Drink too much   Eat a diet that is:  High in fat and red meat.  Low in fiber.  Had childhood cancer that was treated with abdominal radiation. What are the signs or symptoms? Most polyps do not cause symptoms. If you have symptoms, they may include:  Blood coming from your rectum when having a bowel movement.  Blood in your stool.The stool may look dark red or black.  A change in bowel habits, such as constipation or diarrhea. How is this diagnosed? This condition is diagnosed with a colonoscopy. This is a procedure  that uses a lighted, flexible scope to look at the inside of your colon. How is this treated? Treatment for this condition involves removing any polyps that are found. Those polyps will then be tested for cancer. If cancer is found, your health care provider will talk to you about options for colon cancer treatment. Follow these instructions at home: Diet   Eat plenty of fiber, such as fruits, vegetables, and whole grains.  Eat foods that are high in calcium and vitamin D, such as milk, cheese, yogurt, eggs, liver, fish, and broccoli.  Limit foods high in fat, red meats, and processed meats, such as hot dogs, sausage, bacon, and lunch meats.  Maintain a healthy weight, or lose weight if recommended by your health care provider. General instructions   Do not smoke cigarettes.  Do not drink alcohol excessively.  Keep all follow-up visits as told by your health care provider. This is important. This includes keeping regularly scheduled colonoscopies. Talk to your health care provider about when you need a colonoscopy.  Exercise every day or as told by your health care provider. Contact a health care provider if:  You have new or worsening bleeding during a bowel movement.  You have new or increased blood in your stool.  You have a change in bowel habits.  You unexpectedly lose weight. This information is not intended to replace advice given to you by your health care provider. Make sure you discuss any questions you have with your health care provider. Document Released: 06/09/2004 Document Revised: 02/19/2016 Document Reviewed: 08/04/2015 Elsevier Interactive Patient Education  2017 [employer].

## 2016-12-10 NOTE — H&P (Signed)
'@LOGO' @   Primary Care Physician:  Wende Neighbors, MD Primary Gastroenterologist:  Dr. Gala Romney  Pre-Procedure History & Physical: HPI:  Roger Guerra is a 61 y.o. male is here for a screening colonoscopy. Small polyp reportedly removed from his colon at his first colonoscopy 10 years ago in [place]. Was told to return in 10 years. No bowel symptoms. No family history of colon cancer.  Past Medical History:  Diagnosis Date  . Anxiety disorder   . Depression   . Erectile dysfunction   . History of DVT of lower extremity    Right leg 2002   . Hyperlipidemia   . Obstructive sleep apnea   . Sleep apnea    On CPAP   . Type 2 diabetes mellitus ([place])     Past Surgical History:  Procedure Laterality Date  . TONSILLECTOMY      Prior to Admission medications   Medication Sig Start Date End Date Taking? Authorizing Provider  aspirin 81 MG tablet Take 81 mg by mouth daily.   Yes Historical Provider, MD  celecoxib (CELEBREX) 200 MG capsule Take 200 mg by mouth daily. 10/18/16  Yes Historical Provider, MD  cetirizine (ZYRTEC) 10 MG tablet Take 10 mg by mouth every evening.    Yes Historical Provider, MD  co-enzyme Q-10 30 MG capsule Take 30 mg by mouth daily.   Yes Historical Provider, MD  Dulaglutide (TRULICITY) 1.5 OI/3.7CW SOPN Inject 1.5 mg into the skin every Sunday.   Yes Historical Provider, MD  fexofenadine (ALLEGRA) 180 MG tablet Take 180 mg by mouth every morning.    Yes Historical Provider, MD  metFORMIN (GLUMETZA) 1000 MG (MOD) 24 hr tablet Take 1,000 mg by mouth daily.    Yes Historical Provider, MD  methocarbamol (ROBAXIN) 750 MG tablet Take 750 mg by mouth 2 (two) times daily. 10/18/16  Yes Historical Provider, MD  Multiple Vitamin (MULTIVITAMIN WITH MINERALS) TABS tablet Take 1 tablet by mouth daily.   Yes Historical Provider, MD  Na Sulfate-K Sulfate-Mg Sulf (SUPREP BOWEL PREP KIT) 17.5-3.13-1.6 GM/180ML SOLN Take 1 kit by mouth as directed. 11/01/16  Yes Daneil Dolin, MD   Omega-3 Fatty Acids (FISH OIL PO) Take 1 capsule by mouth daily.   Yes Historical Provider, MD  omeprazole (PRILOSEC OTC) 20 MG tablet Take 20 mg by mouth daily as needed (acid reflux).   Yes Historical Provider, MD  PRESCRIPTION MEDICATION Inject 1 Dose as directed as needed (erectile dysfunction). Trimix Papaverine 34m/ml Phentolamine 146mml Prostaglandin 1050mml   Yes Historical Provider, MD  sodium chloride (OCEAN) 0.65 % SOLN nasal spray Place 1 spray into both nostrils as needed for congestion.   Yes Historical Provider, MD  testosterone cypionate (DEPOTESTOSTERONE CYPIONATE) 200 MG/ML injection Inject 200 mg into the muscle every 14 (fourteen) days.   Yes Historical Provider, MD  vitamin E 400 UNIT capsule Take 400 Units by mouth daily.   Yes Historical Provider, MD    Allergies as of 10/28/2016 - Review Complete 10/07/2016  Allergen Reaction Noted  . Invokana [canagliflozin] Other (See Comments) 07/28/2015    Family History  Problem Relation Age of Onset  . Lung cancer Father   . Heart disease Father   . Diabetes Mother   . Diabetes Other     Siblings  . Diabetes Sister     Social History   Social History  . Marital status: Married    Spouse name: N/A  . Number of children: N/A  . Years of education: N/A  Occupational History  . Not on file.   Social History Main Topics  . Smoking status: Former Smoker    Types: Cigarettes  . Smokeless tobacco: Never Used  . Alcohol use No  . Drug use: No  . Sexual activity: No   Other Topics Concern  . Not on file   Social History Narrative  . No narrative on file    Review of Systems: See HPI, otherwise negative ROS  Physical Exam: BP 119/82   Pulse 72   Temp 98.1 F (36.7 C) (Oral)   Resp 12   Ht 6' (1.829 m)   Wt 250 lb (113.4 kg)   SpO2 98%   BMI 33.91 kg/m  General:   Alert,  Well-developed, well-nourished, pleasant and cooperative in NAD Head:  Normocephalic and atraumatic. Lungs:  Clear throughout  to auscultation.   No wheezes, crackles, or rhonchi. No acute distress. Heart:  Regular rate and rhythm; no murmurs, clicks, rubs,  or gallops. Abdomen:  Soft, nontender and nondistended. No masses, hepatosplenomegaly or hernias noted. Normal bowel sounds, without guarding, and without rebound.    Impression/Plan: Roger Guerra is now here to undergo a screening colonoscopy.  Average risk screening examination.  Risks, benefits, limitations, imponderables and alternatives regarding colonoscopy have been reviewed with the patient. Questions have been answered. All parties agreeable.     Notice:  This dictation was prepared with Dragon dictation along with smaller phrase technology. Any transcriptional errors that result from this process are unintentional and may not be corrected upon review.

## 2016-12-13 ENCOUNTER — Encounter (HOSPITAL_COMMUNITY): Payer: Self-pay | Admitting: Internal Medicine

## 2016-12-15 ENCOUNTER — Encounter: Payer: Self-pay | Admitting: Internal Medicine

## 2017-03-16 ENCOUNTER — Other Ambulatory Visit (HOSPITAL_COMMUNITY): Payer: Self-pay | Admitting: Internal Medicine

## 2017-03-16 DIAGNOSIS — M5440 Lumbago with sciatica, unspecified side: Secondary | ICD-10-CM

## 2017-03-24 ENCOUNTER — Ambulatory Visit (HOSPITAL_COMMUNITY)
Admission: RE | Admit: 2017-03-24 | Discharge: 2017-03-24 | Disposition: A | Discharge status: Home / Self Care | Payer: BC Managed Care – PPO | Source: Ambulatory Visit | Attending: Internal Medicine | Admitting: Internal Medicine

## 2017-03-24 DIAGNOSIS — M5126 Other intervertebral disc displacement, lumbar region: Secondary | ICD-10-CM | POA: Diagnosis not present

## 2017-03-24 DIAGNOSIS — M48061 Spinal stenosis, lumbar region without neurogenic claudication: Secondary | ICD-10-CM | POA: Diagnosis not present

## 2017-03-24 DIAGNOSIS — M5440 Lumbago with sciatica, unspecified side: Secondary | ICD-10-CM

## 2017-03-24 DIAGNOSIS — M5442 Lumbago with sciatica, left side: Secondary | ICD-10-CM | POA: Insufficient documentation

## 2017-05-16 ENCOUNTER — Encounter: Payer: Self-pay | Admitting: Physical Therapy

## 2017-05-16 ENCOUNTER — Ambulatory Visit: Payer: BC Managed Care – PPO | Attending: Specialist | Admitting: Physical Therapy

## 2017-05-16 DIAGNOSIS — R293 Abnormal posture: Secondary | ICD-10-CM | POA: Insufficient documentation

## 2017-05-16 DIAGNOSIS — M545 Low back pain: Secondary | ICD-10-CM | POA: Diagnosis not present

## 2017-05-16 DIAGNOSIS — G8929 Other chronic pain: Secondary | ICD-10-CM | POA: Insufficient documentation

## 2017-05-16 NOTE — Therapy (Signed)
[facility] [place], [place], [address] Phone: [phone]   Fax:  [phone]  Physical Therapy Evaluation  Patient Details  Name: Roger Guerra MRN: [id number] Date of Birth: [birth year] Referring Provider: Susa Day MD  Encounter Date: 05/16/2017      PT End of Session - 05/16/17 1711    Visit Number 1   Number of Visits 12   Date for PT Re-Evaluation 06/27/17   PT Start Time 0104   PT Stop Time 0159   PT Time Calculation (min) 55 min   Activity Tolerance Patient tolerated treatment well   Behavior During Therapy [facility] for tasks assessed/performed      Past Medical History:  Diagnosis Date  . Anxiety disorder   . Depression   . Erectile dysfunction   . History of DVT of lower extremity    Right leg 2002   . Hyperlipidemia   . Obstructive sleep apnea   . Sleep apnea    On CPAP   . Type 2 diabetes mellitus ([place])     Past Surgical History:  Procedure Laterality Date  . COLONOSCOPY N/A 12/10/2016   Procedure: COLONOSCOPY;  Surgeon: Daneil Dolin, MD;  [facility];  Service: Endoscopy;  Laterality: N/A;  8:30 AM  . POLYPECTOMY  12/10/2016   Procedure: POLYPECTOMY;  Surgeon: Daneil Dolin, MD;  [facility];  Service: Endoscopy;;  hepatic flexure  . TONSILLECTOMY      There were no vitals filed for this visit.       Subjective Assessment - 05/16/17 1716    Subjective The patient prsents to physical therapy with a long h/o low back pain but on 01/12/17 he had a very significant flare-up.  He states he was carrying a log then stepped into a depression in the ground while turning a corner.  He is experiencing pain into his left LE.  His pain-level today is a 6/10 and increases with standing.  Back exercises decrease his pain.  He has also depended on chiropractic care.  He reports he has also had some mid-back pain with radiation into his right flank region.  The patient further reports  a lack of control in his left LE and he has fallen 4 times over the last 6 months.   Pertinent History See MRI report.   Limitations Standing   How long can you stand comfortably? 10 minutes.   Patient Stated Goals Get out of pain.  "Be able to pick up small items from the floor.   Currently in Pain? Yes   Pain Score 6    Pain Location Back   Pain Orientation Left   Pain Descriptors / Indicators Aching   Pain Type Chronic pain   Pain Radiating Towards Left.   Pain Onset More than a month ago   Pain Frequency Constant   Aggravating Factors  See above.   Pain Relieving Factors See above.            [facility] PT Assessment - 05/16/17 0001      Assessment   Medical Diagnosis Spinal stenosis of lumbar region.   Referring Provider Susa Day MD   Onset Date/Surgical Date --  April 2018.     Precautions   Precautions Fall     Restrictions   Weight Bearing Restrictions No     Balance Screen   Has the patient fallen in the past 6 months Yes   How many times? --  4.  Has the patient had a decrease in activity level because of a fear of falling?  No   Is the patient reluctant to leave their home because of a fear of falling?  No     Home [employer] residence     Prior Function   Level of Independence Independent     Posture/Postural Control   Posture/Postural Control Postural limitations   Postural Limitations Decreased lumbar lordosis     ROM / Strength   AROM / PROM / Strength AROM;Strength     AROM   Overall AROM Comments The patient's lumbar flexion is limited by 75% and is painful when flexing and upon resumption of the upright position.  His lumbar extension is limited to 10 degrees.     Strength   Overall Strength Comments Left dorsiflexion= 4/5 and left knee extension= 4 to 4+/5 when contralaterally compared.     Palpation   Palpation comment Increased tone palpable over right lumbar musculature.     Special Tests    Special  Tests Lumbar;Leg LengthTest  LE DTR's= 1+ to 2+/4+.   Lumbar Tests --  (-)SLR testing but tight hamstrings bilaterally.   Leg length test  --  (=) leg lengths.     Ambulation/Gait   Gait Comments The patient walks in slight lumbar flexion due to pain.            Objective measurements completed on examination: See above findings.          [facility] Adult PT Treatment/Exercise - 05/16/17 0001      Modalities   Modalities Electrical Stimulation;Moist Heat     Moist Heat Therapy   Number Minutes Moist Heat 20 Minutes   Moist Heat Location Lumbar Spine     Electrical Stimulation   Electrical Stimulation Location Lumbar.   Electrical Stimulation Action IFC   Electrical Stimulation Parameters 80-150 Hz on 100% scan x 20 minutes.   Electrical Stimulation Goals Tone;Pain                     PT Long Term Goals - 05/16/17 1732      PT LONG TERM GOAL #1   Title Independent with a HEP.   Time 4   Period Weeks   Status New     PT LONG TERM GOAL #2   Title Stand 20 minutes with pain not > 2-3/10.   Time 4   Period Weeks   Status New     PT LONG TERM GOAL #3   Title Walk a community distance wiht pain not > 2-3/10.   Time 4   Period Weeks   Status New     PT LONG TERM GOAL #4   Title Eliminate left LE symptoms.   Time 4   Period Weeks   Status New                Plan - 05/16/17 1728    Clinical Impression Statement The patient prsents to OPPT with a long h/o long back pain but a significant flare-up in April of 2018.  He has limitations of lumbar range of motion and left LE weakness.  His deficits impair his functional mobility and limit his ability to stand and walk for prolonged periods of time.  Patient will benefit from skilled physical therapy to address deficts.   History and Personal Factors relevant to plan of care: Long h/o low nack pain.   Clinical Presentation Evolving  Clinical Presentation due to: Symptoms not improving.    Clinical Decision Making Low   Rehab Potential Good   PT Frequency 3x / week   PT Duration 4 weeks   PT Treatment/Interventions ADLs/Self Care Home Management;Cryotherapy;Electrical Stimulation;Moist Heat;Traction;Ultrasound;Therapeutic activities;Therapeutic exercise;Manual techniques;Patient/family education;Dry needling   PT Next Visit Plan Try intermittment traction at 35% body weight.  Modalites and STW/M.  Core and back stabilization exercises.   Consulted and Agree with Plan of Care Patient      Patient will benefit from skilled therapeutic intervention in order to improve the following deficits and impairments:  Decreased activity tolerance, Postural dysfunction, Pain, Decreased strength, Decreased range of motion  Visit Diagnosis: Chronic left-sided low back pain, with sciatica presence unspecified - Plan: PT plan of care cert/re-cert  Abnormal posture - Plan: PT plan of care cert/re-cert     Problem List Patient Active Problem List   Diagnosis Date Noted  . Male hypogonadism 07/24/2015  . Uncontrolled type 2 diabetes mellitus without complication, without long-term current use of insulin ([place]) 07/24/2015  . Hyperlipidemia 07/24/2015    Vianna Venezia, [place] MPT 05/16/2017, 5:36 PM  [facility] [address] [place], [place], [address] Phone: [phone]   Fax:  [phone]  Name: Omer Puccinelli MRN: [id number] Date of Birth: 07/01/1956

## 2017-05-18 ENCOUNTER — Encounter: Payer: Self-pay | Admitting: Physical Therapy

## 2017-05-18 ENCOUNTER — Ambulatory Visit: Payer: BC Managed Care – PPO | Admitting: Physical Therapy

## 2017-05-18 DIAGNOSIS — M545 Low back pain: Principal | ICD-10-CM

## 2017-05-18 DIAGNOSIS — G8929 Other chronic pain: Secondary | ICD-10-CM

## 2017-05-18 DIAGNOSIS — R293 Abnormal posture: Secondary | ICD-10-CM

## 2017-05-18 NOTE — Therapy (Signed)
[facility] [place], [place], [address] Phone: [phone]   Fax:  [phone]  Physical Therapy Treatment  Patient Details  Name: Hobert Poplaski MRN: [id number] Date of Birth: 09/15/1956 Referring Provider: Susa Day MD  Encounter Date: 05/18/2017      PT End of Session - 05/18/17 1405    Visit Number 2   Number of Visits 12   Date for PT Re-Evaluation 06/27/17   PT Start Time 2229   PT Stop Time 1409   PT Time Calculation (min) 51 min   Activity Tolerance Patient tolerated treatment well   Behavior During Therapy [facility] for tasks assessed/performed      Past Medical History:  Diagnosis Date  . Anxiety disorder   . Depression   . Erectile dysfunction   . History of DVT of lower extremity    Right leg 2002   . Hyperlipidemia   . Obstructive sleep apnea   . Sleep apnea    On CPAP   . Type 2 diabetes mellitus ([place])     Past Surgical History:  Procedure Laterality Date  . COLONOSCOPY N/A 12/10/2016   Procedure: COLONOSCOPY;  Surgeon: Daneil Dolin, MD;  [facility];  Service: Endoscopy;  Laterality: N/A;  8:30 AM  . POLYPECTOMY  12/10/2016   Procedure: POLYPECTOMY;  Surgeon: Daneil Dolin, MD;  [facility];  Service: Endoscopy;;  hepatic flexure  . TONSILLECTOMY      There were no vitals filed for this visit.      Subjective Assessment - 05/18/17 1323    Subjective Patient reported some ongoing pain in back and required pain meds today   Pertinent History See MRI report.   Limitations Standing   How long can you stand comfortably? 10 minutes.   Patient Stated Goals Get out of pain.  "Be able to pick up small items from the floor.   Currently in Pain? Yes   Pain Score 3    Pain Location Back   Pain Orientation Left;Lower   Pain Descriptors / Indicators Aching   Pain Type Acute pain   Pain Radiating Towards left flank and down to left LE to thigh and knee    Pain Onset More  than a month ago   Pain Frequency Constant   Aggravating Factors  prolong standing   Pain Relieving Factors at rest                         [facility] Adult PT Treatment/Exercise - 05/18/17 0001      Self-Care   Self-Care ADL's;Lifting;Posture   ADL's work and home activities   Lifting power lift   Posture all positions     Exercises   Exercises Lumbar     Lumbar Exercises: Supine   Ab Set 20 reps;3 seconds   Glut Set 20 reps;3 seconds   Bent Knee Raise 3 seconds  2x10   Straight Leg Raise 3 seconds  2x10     Modalities   Modalities Electrical Stimulation;Moist Heat;Traction     Moist Heat Therapy   Number Minutes Moist Heat 15 Minutes   Moist Heat Location Lumbar Spine     Electrical Stimulation   Electrical Stimulation Location Lumbar.   Electrical Stimulation Action IFC   Electrical Stimulation Parameters 80-150hz  x79min   Electrical Stimulation Goals Tone;Pain     Traction   Type of Traction Lumbar   Min (lbs) 5   Max (lbs)  85   Hold Time 99   Rest Time 5   Time 15                PT Education - 05/18/17 1404    Education provided Yes   Education Details HEP   Person(s) Educated Patient   Methods Explanation;Demonstration;Handout   Comprehension Verbalized understanding;Returned demonstration             PT Long Term Goals - 05/16/17 1732      PT LONG TERM GOAL #1   Title Independent with a HEP.   Time 4   Period Weeks   Status New     PT LONG TERM GOAL #2   Title Stand 20 minutes with pain not > 2-3/10.   Time 4   Period Weeks   Status New     PT LONG TERM GOAL #3   Title Walk a community distance wiht pain not > 2-3/10.   Time 4   Period Weeks   Status New     PT LONG TERM GOAL #4   Title Eliminate left LE symptoms.   Time 4   Period Weeks   Status New               Plan - 05/18/17 1412    Clinical Impression Statement Patient tolerated treatment well today. Patient did well with core activation  techniques and educated on posture awareness techniques. HEP given today. Patient current goals ongoing.   Rehab Potential Good   PT Frequency 3x / week   PT Duration 4 weeks   PT Treatment/Interventions ADLs/Self Care Home Management;Cryotherapy;Electrical Stimulation;Moist Heat;Traction;Ultrasound;Therapeutic activities;Therapeutic exercise;Manual techniques;Patient/family education;Dry needling   PT Next Visit Plan asess traction/ Modalites and STW/M.  Core and back stabilization exercises.   Consulted and Agree with Plan of Care Patient      Patient will benefit from skilled therapeutic intervention in order to improve the following deficits and impairments:  Decreased activity tolerance, Postural dysfunction, Pain, Decreased strength, Decreased range of motion  Visit Diagnosis: Chronic left-sided low back pain, with sciatica presence unspecified  Abnormal posture     Problem List Patient Active Problem List   Diagnosis Date Noted  . Male hypogonadism 07/24/2015  . Uncontrolled type 2 diabetes mellitus without complication, without long-term current use of insulin ([place]) 07/24/2015  . Hyperlipidemia 07/24/2015    DUNFORD, CHRISTINA P, PTA 05/18/2017, 2:20 PM  [facility] [address] [place], [place], [address] Phone: [phone]   Fax:  [phone]  Name: Fernado Brigante MRN: [id number] Date of Birth: July 02, 1956

## 2017-05-18 NOTE — Patient Instructions (Signed)
Pelvic Tilt: Posterior - Legs Bent (Supine)   Tighten stomach and flatten back by rolling pelvis down. Hold _10___ seconds. Relax. Repeat _10-30___ times per set. Do __2__ sets per session. Do _2___ sessions per day.   Bent Leg Lift (Hook-Lying)   Tighten stomach and slowly raise right leg _5___ inches from floor. Keep trunk rigid. Hold _3___ seconds. Repeat _10___ times per set. Do ___2-3_ sets per session. Do __2__ sessions per day.  Brushing Teeth    Place one foot on ledge and one hand on counter. Bend other knee slightly to keep back straight.  Copyright  VHI. All rights reserved.  Refrigerator   Squat with knees apart to reach lower shelves and drawers.   Copyright  VHI. All rights reserved.  Laundry [name] down and hold basket close to stand. Use leg muscles to do the work.   Copyright  VHI. All rights reserved.  Housework - Vacuuming   Hold the vacuum with arm held at side. Step back and forth to move it, keeping head up. Avoid twisting.   Copyright  VHI. All rights reserved.  Housework - Wiping   Position yourself as close as possible to reach work surface. Avoid straining your back.   Copyright  VHI. All rights reserved.  Gardening - Mowing   Keep arms close to sides and walk with lawn mower.     Stand to Sit / Sit to Stand   To sit: Bend knees to lower self onto front edge of chair, then scoot back on seat. To stand: Reverse sequence by placing one foot forward, and scoot to front of seat. Use rocking motion to stand up.  Copyright  VHI. All rights reserved.  Posture - Standing   Good posture is important. Avoid slouching and forward head thrust. Maintain curve in low back and align ears over shoul- ders, hips over ankles.    Posture - Sitting   Sit upright, head facing forward. Try using a roll to support lower back. Keep shoulders relaxed, and avoid rounded back. Keep hips level with knees. Avoid crossing legs for  long periods.

## 2017-05-24 ENCOUNTER — Ambulatory Visit: Payer: BC Managed Care – PPO | Admitting: Physical Therapy

## 2017-05-24 DIAGNOSIS — M545 Low back pain: Principal | ICD-10-CM

## 2017-05-24 DIAGNOSIS — G8929 Other chronic pain: Secondary | ICD-10-CM

## 2017-05-24 NOTE — Therapy (Signed)
[facility] [place], [place], [address] Phone: [phone]   Fax:  [phone]  Physical Therapy Treatment  Patient Details  Name: Roger Guerra MRN: [id number] Date of Birth: Apr 13, 1956 Referring Provider: Susa Day MD  Encounter Date: 05/24/2017      PT End of Session - 05/24/17 1732    Visit Number 3   Number of Visits 12   Date for PT Re-Evaluation 06/27/17   PT Start Time 2423   PT Stop Time 0541   PT Time Calculation (min) 56 min      Past Medical History:  Diagnosis Date  . Anxiety disorder   . Depression   . Erectile dysfunction   . History of DVT of lower extremity    Right leg 2002   . Hyperlipidemia   . Obstructive sleep apnea   . Sleep apnea    On CPAP   . Type 2 diabetes mellitus ([place])     Past Surgical History:  Procedure Laterality Date  . COLONOSCOPY N/A 12/10/2016   Procedure: COLONOSCOPY;  Surgeon: Daneil Dolin, MD;  [facility];  Service: Endoscopy;  Laterality: N/A;  8:30 AM  . POLYPECTOMY  12/10/2016   Procedure: POLYPECTOMY;  Surgeon: Daneil Dolin, MD;  [facility];  Service: Endoscopy;;  hepatic flexure  . TONSILLECTOMY      There were no vitals filed for this visit.      Subjective Assessment - 05/24/17 1727    Subjective My back is doing good today.   Patient Stated Goals Get out of pain.  "Be able to pick up small items from the floor.   Pain Score 2    Pain Location Back   Pain Orientation Right;Left                         [facility] Adult PT Treatment/Exercise - 05/24/17 0001      Exercises   Exercises Knee/Hip     Knee/Hip Exercises: Aerobic   Nustep LEVEL 3 x 14 minutes.     Traction   Type of Traction Lumbar   Min (lbs) 5   Max (lbs) 95   Hold Time 99   Rest Time 5   Time 15     Manual Therapy   Manual Therapy Soft tissue mobilization   Soft tissue mobilization In prone:  STW/M and QL relase with gentle PA  mobs to lumbar vertebrae x 10 minutes.                     PT Long Term Goals - 05/16/17 1732      PT LONG TERM GOAL #1   Title Independent with a HEP.   Time 4   Period Weeks   Status New     PT LONG TERM GOAL #2   Title Stand 20 minutes with pain not > 2-3/10.   Time 4   Period Weeks   Status New     PT LONG TERM GOAL #3   Title Walk a community distance wiht pain not > 2-3/10.   Time 4   Period Weeks   Status New     PT LONG TERM GOAL #4   Title Eliminate left LE symptoms.   Time 4   Period Weeks   Status New               Plan - 05/24/17 1741    Clinical Impression  Statement Excellent job with treatment today with a reduction in pain following.      Patient will benefit from skilled therapeutic intervention in order to improve the following deficits and impairments:     Visit Diagnosis: Chronic midline low back pain, with sciatica presence unspecified     Problem List Patient Active Problem List   Diagnosis Date Noted  . Male hypogonadism 07/24/2015  . Uncontrolled type 2 diabetes mellitus without complication, without long-term current use of insulin ([place]) 07/24/2015  . Hyperlipidemia 07/24/2015    APPLE[place]ATE, [place] MPT 05/24/2017, 5:42 PM  [facility] [address] [place], [place], [address] Phone: [phone]   Fax:  [phone]  Name: Roger Guerra MRN: [id number] Date of Birth: Sep 19, 1956

## 2017-05-26 ENCOUNTER — Encounter: Payer: Self-pay | Admitting: Physical Therapy

## 2017-05-26 ENCOUNTER — Ambulatory Visit: Payer: BC Managed Care – PPO | Admitting: Physical Therapy

## 2017-05-26 DIAGNOSIS — M545 Low back pain: Secondary | ICD-10-CM | POA: Diagnosis not present

## 2017-05-26 DIAGNOSIS — R293 Abnormal posture: Secondary | ICD-10-CM

## 2017-05-26 DIAGNOSIS — G8929 Other chronic pain: Secondary | ICD-10-CM

## 2017-05-26 NOTE — Therapy (Signed)
[facility] [place], [place], [address] Phone: [phone]   Fax:  [phone]  Physical Therapy Treatment  Patient Details  Name: Roger Guerra MRN: [id number] Date of Birth: 16-Apr-1956 Referring Provider: Susa Day MD  Encounter Date: 05/26/2017      PT End of Session - 05/26/17 1648    Visit Number 4   Number of Visits 12   Date for PT Re-Evaluation 06/27/17   PT Start Time 9833   PT Stop Time 1741   PT Time Calculation (min) 55 min   Activity Tolerance Patient tolerated treatment well   Behavior During Therapy [facility] for tasks assessed/performed      Past Medical History:  Diagnosis Date  . Anxiety disorder   . Depression   . Erectile dysfunction   . History of DVT of lower extremity    Right leg 2002   . Hyperlipidemia   . Obstructive sleep apnea   . Sleep apnea    On CPAP   . Type 2 diabetes mellitus ([place])     Past Surgical History:  Procedure Laterality Date  . COLONOSCOPY N/A 12/10/2016   Procedure: COLONOSCOPY;  Surgeon: Daneil Dolin, MD;  [facility];  Service: Endoscopy;  Laterality: N/A;  8:30 AM  . POLYPECTOMY  12/10/2016   Procedure: POLYPECTOMY;  Surgeon: Daneil Dolin, MD;  [facility];  Service: Endoscopy;;  hepatic flexure  . TONSILLECTOMY      There were no vitals filed for this visit.      Subjective Assessment - 05/26/17 1646    Subjective Reports that not going to the chiropractor is really killing him cause that usually helps.   Pertinent History See MRI report.   Limitations Standing   How long can you stand comfortably? 10 minutes.   Patient Stated Goals Get out of pain.  "Be able to pick up small items from the floor.   Currently in Pain? Yes   Pain Score 7    Pain Location Back   Pain Orientation Right;Left;Mid   Pain Descriptors / Indicators Sharp   Pain Type Acute pain   Pain Onset More than a month ago   Multiple Pain Sites Yes   Pain  Score 3   Pain Location Back   Pain Orientation Lower   Pain Descriptors / Indicators Aching            [facility] PT Assessment - 05/26/17 0001      Assessment   Medical Diagnosis Spinal stenosis of lumbar region.   Next MD Visit 05/27/2017  for spinal injections     Precautions   Precautions Fall     Restrictions   Weight Bearing Restrictions No                     [facility] Adult PT Treatment/Exercise - 05/26/17 0001      Lumbar Exercises: Stretches   Single Knee to Chest Stretch 3 reps;30 seconds   Single Knee to Chest Stretch Limitations BLE     Lumbar Exercises: Aerobic   Stationary Bike Nustep L5 x12 min  slower pace     Lumbar Exercises: Supine   Ab Set 20 reps;4 seconds   Bent Knee Raise Other (comment)  x30 rep with core activation   Bridge 20 reps;3 seconds     Modalities   Modalities Traction     Traction   Type of Traction Lumbar   Min (lbs) 5   Max (  lbs) 90   Hold Time 99   Rest Time 5   Time 15                     PT Long Term Goals - 05/16/17 1732      PT LONG TERM GOAL #1   Title Independent with a HEP.   Time 4   Period Weeks   Status New     PT LONG TERM GOAL #2   Title Stand 20 minutes with pain not > 2-3/10.   Time 4   Period Weeks   Status New     PT LONG TERM GOAL #3   Title Walk a community distance wiht pain not > 2-3/10.   Time 4   Period Weeks   Status New     PT LONG TERM GOAL #4   Title Eliminate left LE symptoms.   Time 4   Period Weeks   Status New               Plan - 05/26/17 1743    Clinical Impression Statement Patient presented in clinic with reports of greater thoracic and rib pain than LBP. Patient experienced reduction of thoracic pain with Nustep exercise. Patient guided through B low back stretching as well as low level core activation exercise to progress core strengthening and lumbar stability. Patient had no negetive complaints during treatment. Patient reports  experiencing relief with a technique of bridging with folded washcloth under ribs as he was taught in the past. With patient report of soreness following previous session of traction at 95#, traction max was placed at 90# with patient tolerant of the session by the end of the PT treatment.   Rehab Potential Good   PT Frequency 3x / week   PT Duration 4 weeks   PT Treatment/Interventions ADLs/Self Care Home Management;Cryotherapy;Electrical Stimulation;Moist Heat;Traction;Ultrasound;Therapeutic activities;Therapeutic exercise;Manual techniques;Patient/family education;Dry needling   PT Next Visit Plan asess traction/ Modalites and STW/M.  Core and back stabilization exercises.   Consulted and Agree with Plan of Care Patient      Patient will benefit from skilled therapeutic intervention in order to improve the following deficits and impairments:  Decreased activity tolerance, Postural dysfunction, Pain, Decreased strength, Decreased range of motion  Visit Diagnosis: Chronic midline low back pain, with sciatica presence unspecified  Chronic left-sided low back pain, with sciatica presence unspecified  Abnormal posture     Problem List Patient Active Problem List   Diagnosis Date Noted  . Male hypogonadism 07/24/2015  . Uncontrolled type 2 diabetes mellitus without complication, without long-term current use of insulin ([place]) 07/24/2015  . Hyperlipidemia 07/24/2015    Wynelle Fanny, PTA 05/26/2017, 5:46 PM  [facility] [address] [place], [place], [address] Phone: [phone]   Fax:  [phone]  Name: Roger Guerra MRN: [id number] Date of Birth: 31-Dec-1955

## 2017-06-07 ENCOUNTER — Ambulatory Visit: Payer: BC Managed Care – PPO | Attending: Specialist | Admitting: Physical Therapy

## 2017-06-07 ENCOUNTER — Encounter: Payer: Self-pay | Admitting: Physical Therapy

## 2017-06-07 DIAGNOSIS — R293 Abnormal posture: Secondary | ICD-10-CM | POA: Diagnosis present

## 2017-06-07 DIAGNOSIS — G8929 Other chronic pain: Secondary | ICD-10-CM | POA: Insufficient documentation

## 2017-06-07 DIAGNOSIS — M545 Low back pain: Secondary | ICD-10-CM | POA: Insufficient documentation

## 2017-06-07 NOTE — Therapy (Signed)
[facility] [place], [place], [address] Phone: [phone]   Fax:  [phone]  Physical Therapy Treatment  Patient Details  Name: Roger Guerra MRN: [id number] Date of Birth: December 22, 1955 Referring Provider: Susa Day MD  Encounter Date: 06/07/2017      PT End of Session - 06/07/17 1650    Visit Number 5   Number of Visits 12   Date for PT Re-Evaluation 06/27/17   PT Start Time 2706   PT Stop Time 1739   PT Time Calculation (min) 48 min   Activity Tolerance Patient tolerated treatment well   Behavior During Therapy [facility] for tasks assessed/performed      Past Medical History:  Diagnosis Date  . Anxiety disorder   . Depression   . Erectile dysfunction   . History of DVT of lower extremity    Right leg 2002   . Hyperlipidemia   . Obstructive sleep apnea   . Sleep apnea    On CPAP   . Type 2 diabetes mellitus ([place])     Past Surgical History:  Procedure Laterality Date  . COLONOSCOPY N/A 12/10/2016   Procedure: COLONOSCOPY;  Surgeon: Daneil Dolin, MD;  [facility];  Service: Endoscopy;  Laterality: N/A;  8:30 AM  . POLYPECTOMY  12/10/2016   Procedure: POLYPECTOMY;  Surgeon: Daneil Dolin, MD;  [facility];  Service: Endoscopy;;  hepatic flexure  . TONSILLECTOMY      There were no vitals filed for this visit.      Subjective Assessment - 06/07/17 1647    Subjective Reports that since he was at PT last and states that his back has been worse. Reports good results from injection lasted one week but pain has returned to intensity of one month ago. Reports that he was at the gym and dangled from equiptment per recommendation from his masssage therapist which has caused LLE pain again to ankle and tingling as well as numbness. Reports also L foot cramps as well. Reports that he was unable to get in pool at [facility] last week. Reports that he has heard of infrared heating pads and was  wondering about them.   Pertinent History See MRI report.   Limitations Standing   How long can you stand comfortably? 10 minutes.   Patient Stated Goals Get out of pain.  "Be able to pick up small items from the floor.   Currently in Pain? Yes   Pain Score 5    Pain Location Back   Pain Orientation Right;Left   Pain Descriptors / Indicators Discomfort   Pain Type Acute pain   Pain Radiating Towards down LLE to ankle region   Pain Onset More than a month ago   Pain Frequency Constant            [facility] PT Assessment - 06/07/17 0001      Assessment   Medical Diagnosis Spinal stenosis of lumbar region.   Next MD Visit 06/2017     Precautions   Precautions Fall     Restrictions   Weight Bearing Restrictions No                     [facility] Adult PT Treatment/Exercise - 06/07/17 0001      Lumbar Exercises: Aerobic   Stationary Bike Nustep L5 x10 min     Modalities   Modalities Electrical Stimulation;Moist Heat     Moist Heat Therapy   Number Minutes  Moist Heat 15 Minutes   Moist Heat Location Lumbar Spine     Electrical Stimulation   Electrical Stimulation Location R low back   Electrical Stimulation Action Pre-Mod   Electrical Stimulation Parameters 80-150 hz x15 min   Electrical Stimulation Goals Tone;Pain     Manual Therapy   Manual Therapy Soft tissue mobilization;Passive ROM   Soft tissue mobilization STW to L lumbar paraspinals, QL to reduce tone and pain in prone   Passive ROM Passive B HS stretch, SKTC 3x30 sec                     PT Long Term Goals - 05/16/17 1732      PT LONG TERM GOAL #1   Title Independent with a HEP.   Time 4   Period Weeks   Status New     PT LONG TERM GOAL #2   Title Stand 20 minutes with pain not > 2-3/10.   Time 4   Period Weeks   Status New     PT LONG TERM GOAL #3   Title Walk a community distance wiht pain not > 2-3/10.   Time 4   Period Weeks   Status New     PT LONG TERM GOAL #4    Title Eliminate left LE symptoms.   Time 4   Period Weeks   Status New               Plan - 06/07/17 1731    Clinical Impression Statement Patient presented in clinic with mid level L low back pain with radiation symptoms down LLE. By end of Nustep session patient reported tightness starting to increase. Patient reported feeling "better" following passive B low back and hip stretching. Minimal increased tightness palpated in L QL region upon palpation. Electrical stimulation and moist heat completed in prone today per patient preference. Patient educated to continue HEP and stretching with monitoring of symptoms. Patient educated that he may need to research infrared heating pads and to remain careful to avoid injury.   Rehab Potential Good   PT Frequency 3x / week   PT Duration 4 weeks   PT Treatment/Interventions ADLs/Self Care Home Management;Cryotherapy;Electrical Stimulation;Moist Heat;Traction;Ultrasound;Therapeutic activities;Therapeutic exercise;Manual techniques;Patient/family education;Dry needling   PT Next Visit Plan Monitor symptoms to dictate treatments per MPT POC.   Consulted and Agree with Plan of Care Patient      Patient will benefit from skilled therapeutic intervention in order to improve the following deficits and impairments:  Decreased activity tolerance, Postural dysfunction, Pain, Decreased strength, Decreased range of motion  Visit Diagnosis: Chronic midline low back pain, with sciatica presence unspecified  Chronic left-sided low back pain, with sciatica presence unspecified  Abnormal posture     Problem List Patient Active Problem List   Diagnosis Date Noted  . Male hypogonadism 07/24/2015  . Uncontrolled type 2 diabetes mellitus without complication, without long-term current use of insulin ([place]) 07/24/2015  . Hyperlipidemia 07/24/2015    Wynelle Fanny, PTA 06/07/2017, 5:45 PM  [facility] [address] [place], [place], [address] Phone: [phone]   Fax:  [phone]  Name: Zacheriah Stumpe MRN: [id number] Date of Birth: 1955/11/05

## 2017-06-09 ENCOUNTER — Ambulatory Visit: Payer: BC Managed Care – PPO | Admitting: Physical Therapy

## 2017-06-09 ENCOUNTER — Encounter: Payer: Self-pay | Admitting: Physical Therapy

## 2017-06-09 DIAGNOSIS — G8929 Other chronic pain: Secondary | ICD-10-CM

## 2017-06-09 DIAGNOSIS — M545 Low back pain: Secondary | ICD-10-CM | POA: Diagnosis not present

## 2017-06-09 DIAGNOSIS — R293 Abnormal posture: Secondary | ICD-10-CM

## 2017-06-09 NOTE — Therapy (Signed)
[facility] [place], [place], [address] Phone: [phone]   Fax:  [phone]  Physical Therapy Treatment  Patient Details  Name: Roger Guerra MRN: [id number] Date of Birth: 06-15-1956 Referring Provider: Susa Day MD  Encounter Date: 06/09/2017      PT End of Session - 06/09/17 1650    Visit Number 6   Number of Visits 12   Date for PT Re-Evaluation 06/27/17   PT Start Time 1660   PT Stop Time 1726   PT Time Calculation (min) 49 min   Activity Tolerance Patient tolerated treatment well   Behavior During Therapy [facility] for tasks assessed/performed      Past Medical History:  Diagnosis Date  . Anxiety disorder   . Depression   . Erectile dysfunction   . History of DVT of lower extremity    Right leg 2002   . Hyperlipidemia   . Obstructive sleep apnea   . Sleep apnea    On CPAP   . Type 2 diabetes mellitus ([place])     Past Surgical History:  Procedure Laterality Date  . COLONOSCOPY N/A 12/10/2016   Procedure: COLONOSCOPY;  Surgeon: Daneil Dolin, MD;  [facility];  Service: Endoscopy;  Laterality: N/A;  8:30 AM  . POLYPECTOMY  12/10/2016   Procedure: POLYPECTOMY;  Surgeon: Daneil Dolin, MD;  [facility];  Service: Endoscopy;;  hepatic flexure  . TONSILLECTOMY      There were no vitals filed for this visit.      Subjective Assessment - 06/09/17 1642    Subjective Reports that his back is better than it was Tuesday although he reported some pain Tuesday following treatment.  States that he did go to the pool yesterday.   Pertinent History See MRI report.   Limitations Standing   How long can you stand comfortably? 10 minutes.   Patient Stated Goals Get out of pain.  "Be able to pick up small items from the floor.   Currently in Pain? Yes   Pain Score 2    Pain Location Back   Pain Orientation Right;Left   Pain Descriptors / Indicators Discomfort   Pain Type Acute pain   Pain Onset More than a month ago            [facility] PT Assessment - 06/09/17 0001      Assessment   Medical Diagnosis Spinal stenosis of lumbar region.   Next MD Visit 06/2017     Precautions   Precautions Fall     Restrictions   Weight Bearing Restrictions No                     [facility] Adult PT Treatment/Exercise - 06/09/17 0001      Lumbar Exercises: Aerobic   Stationary Bike Nustep L5 x13 min     Modalities   Modalities Electrical Stimulation;Moist Heat     Moist Heat Therapy   Number Minutes Moist Heat 15 Minutes   Moist Heat Location Lumbar Spine     Electrical Stimulation   Electrical Stimulation Location B lumbar paraspinals   Electrical Stimulation Action IFC   Electrical Stimulation Parameters 80-150 hz x15 min   Electrical Stimulation Goals Tone;Pain     Manual Therapy   Manual Therapy Soft tissue mobilization;Passive ROM   Soft tissue mobilization STW to L lumbar paraspinals, QL, B buttocks and Piriformis to reduce tone and pain in prone   Passive ROM Passive B  HS stretch, SKTC 3x30 sec                     PT Long Term Goals - 05/16/17 1732      PT LONG TERM GOAL #1   Title Independent with a HEP.   Time 4   Period Weeks   Status New     PT LONG TERM GOAL #2   Title Stand 20 minutes with pain not > 2-3/10.   Time 4   Period Weeks   Status New     PT LONG TERM GOAL #3   Title Walk a community distance wiht pain not > 2-3/10.   Time 4   Period Weeks   Status New     PT LONG TERM GOAL #4   Title Eliminate left LE symptoms.   Time 4   Period Weeks   Status New               Plan - 06/09/17 1734    Clinical Impression Statement Patient presented in clinic with low level low back pain which increased in L low back during Nustep which was stopped. Passive B low back and hip stretching continued today along with manual therapy following to control pain and tightness. Patient presented with minimal to moderate L  QL tightness which he indicated as where his back "tightens up" intermittantly. Patient encouraged also to complete Piriformis stretches to reduce buttock pain as well. Normal modalities response noted following removal of the modalities.   Rehab Potential Good   PT Frequency 3x / week   PT Duration 4 weeks   PT Treatment/Interventions ADLs/Self Care Home Management;Cryotherapy;Electrical Stimulation;Moist Heat;Traction;Ultrasound;Therapeutic activities;Therapeutic exercise;Manual techniques;Patient/family education;Dry needling   PT Next Visit Plan Monitor symptoms to dictate treatments per MPT POC.   Consulted and Agree with Plan of Care Patient      Patient will benefit from skilled therapeutic intervention in order to improve the following deficits and impairments:  Decreased activity tolerance, Postural dysfunction, Pain, Decreased strength, Decreased range of motion  Visit Diagnosis: Chronic midline low back pain, with sciatica presence unspecified  Chronic left-sided low back pain, with sciatica presence unspecified  Abnormal posture     Problem List Patient Active Problem List   Diagnosis Date Noted  . Male hypogonadism 07/24/2015  . Uncontrolled type 2 diabetes mellitus without complication, without long-term current use of insulin ([place]) 07/24/2015  . Hyperlipidemia 07/24/2015    Roger Guerra, PTA 06/09/2017, 5:36 PM  [facility] [address] [place], [place], [address] Phone: [phone]   Fax:  [phone]  Name: Roger Guerra MRN: [id number] Date of Birth: 1956/05/08

## 2017-06-13 ENCOUNTER — Ambulatory Visit: Payer: BC Managed Care – PPO | Admitting: Physical Therapy

## 2017-06-13 DIAGNOSIS — G8929 Other chronic pain: Secondary | ICD-10-CM

## 2017-06-13 DIAGNOSIS — M545 Low back pain: Secondary | ICD-10-CM | POA: Diagnosis not present

## 2017-06-13 NOTE — Therapy (Signed)
[facility] [place], [place], [address] Phone: [phone]   Fax:  [phone]  Physical Therapy Treatment  Patient Details  Name: Roger Guerra MRN: [id number] Date of Birth: 10-31-1955 Referring Provider: Susa Day MD  Encounter Date: 06/13/2017      PT End of Session - 06/13/17 1301    Visit Number 7   Number of Visits 12   Date for PT Re-Evaluation 06/27/17   PT Start Time 1300   PT Stop Time 1406   PT Time Calculation (min) 66 min   Activity Tolerance Patient tolerated treatment well   Behavior During Therapy [facility] for tasks assessed/performed      Past Medical History:  Diagnosis Date  . Anxiety disorder   . Depression   . Erectile dysfunction   . History of DVT of lower extremity    Right leg 2002   . Hyperlipidemia   . Obstructive sleep apnea   . Sleep apnea    On CPAP   . Type 2 diabetes mellitus ([place])     Past Surgical History:  Procedure Laterality Date  . COLONOSCOPY N/A 12/10/2016   Procedure: COLONOSCOPY;  Surgeon: Daneil Dolin, MD;  [facility];  Service: Endoscopy;  Laterality: N/A;  8:30 AM  . POLYPECTOMY  12/10/2016   Procedure: POLYPECTOMY;  Surgeon: Daneil Dolin, MD;  [facility];  Service: Endoscopy;;  hepatic flexure  . TONSILLECTOMY      There were no vitals filed for this visit.      Subjective Assessment - 06/13/17 1302    Subjective Pain today is in Low back and just achy in the L ankle and in the hip. The left ankle is tingley.   Pertinent History See MRI report.   Limitations Standing   Patient Stated Goals Get out of pain.  "Be able to pick up small items from the floor.   Currently in Pain? Yes   Pain Score 4    Pain Location Back   Pain Orientation Left   Pain Descriptors / Indicators Discomfort   Pain Type Acute pain                         [facility] Adult PT Treatment/Exercise - 06/13/17 0001      Lumbar Exercises:  Stretches   Quad Stretch 2 reps;30 seconds   Quad Stretch Limitations mulitple stretches attempted for HF and quads and hanging leg off EOB finalized for HEP   Piriformis Stretch 2 reps;30 seconds   Piriformis Stretch Limitations multiple versions attempted with seated x-body finalized for HEP     Lumbar Exercises: Standing   Other Standing Lumbar Exercises standing extension 2  x 10     Lumbar Exercises: Supine   Bridge 20 reps  after mobs; no pain     Lumbar Exercises: Prone   Other Prone Lumbar Exercises prone; POE and Press ups 3  x 10  low back 1-2/10, hip ache abolished     Traction   Type of Traction Lumbar   Min (lbs) 5   Max (lbs) 90   Hold Time 99   Rest Time 5   Time 15     Manual Therapy   Manual Therapy Joint mobilization   Joint Mobilization L4/5 and L5/S1 PA and UPa mobs gdIII/IV                      PT Long Term  Goals - 05/16/17 1732      PT LONG TERM GOAL #1   Title Independent with a HEP.   Time 4   Period Weeks   Status New     PT LONG TERM GOAL #2   Title Stand 20 minutes with pain not > 2-3/10.   Time 4   Period Weeks   Status New     PT LONG TERM GOAL #3   Title Walk a community distance wiht pain not > 2-3/10.   Time 4   Period Weeks   Status New     PT LONG TERM GOAL #4   Title Eliminate left LE symptoms.   Time 4   Period Weeks   Status New               Plan - 06/13/17 1449    Clinical Impression Statement Patient responded very well to extension protocol and mobilizations today reporting centralized and reduced pain after. he will continue extension as part of HEP. He has tightness in his R quads and was issued a stretch for that. Traction was resumed at 90# today with normal response.    PT Treatment/Interventions ADLs/Self Care Home Management;Cryotherapy;Electrical Stimulation;Moist Heat;Traction;Ultrasound;Therapeutic activities;Therapeutic exercise;Manual techniques;Patient/family education;Dry needling    PT Next Visit Plan Continue with extension protocol, PA mobs to L4/5/S1. Traction as indicated. Continue core strengthening.   PT Home Exercise Plan prone press ups and standing extension. R quad and L piriformis stretches.      Patient will benefit from skilled therapeutic intervention in order to improve the following deficits and impairments:  Decreased activity tolerance, Postural dysfunction, Pain, Decreased strength, Decreased range of motion  Visit Diagnosis: Chronic midline low back pain, with sciatica presence unspecified     Problem List Patient Active Problem List   Diagnosis Date Noted  . Male hypogonadism 07/24/2015  . Uncontrolled type 2 diabetes mellitus without complication, without long-term current use of insulin ([place]) 07/24/2015  . Hyperlipidemia 07/24/2015   Madelyn Flavors PT 06/13/2017, 2:59 PM  [facility] [address] [place], [place], [address] Phone: [phone]   Fax:  [phone]  Name: Roger Guerra MRN: [id number] Date of Birth: [birth year]

## 2017-06-13 NOTE — Patient Instructions (Signed)
Hold all stretches 30-60 seconds and repeat 3 times. Do 2x/day.  Piriformis (Supine)  Cross legs, right on top. Gently pull other knee toward chest until stretch is felt in buttock/hip of top leg. Hold ____ seconds. Repeat ____ times per set. Do ____ sets per session. Do ____ sessions per day.  Hip Stretch  Put right ankle over left knee. Let right knee fall downward, but keep ankle in place. Feel the stretch in hip. May push down gently with hand to feel stretch. Hold ____ seconds while counting out loud. Repeat with other leg. Repeat ____ times. Do ____ sessions per day.   HIP: Flexors - Supine - Right side    Lie on edge of surface. Place leg off the surface, allow knee to bend. Bring other knee toward chest. Hold _60__ seconds. _3__ reps per set, _2-3__ times per day, Rest lowered foot on stool if needed.   Extension   Lie face down, hands close to chest. Press trunk up, arching back. Keep neck long, shoulders down. Tighten buttocks to protect lower back. Do 10 times. 1-3 sets. 2x/day or more.   Backward Bend (Standing) Alternative to above.   Arch backward to make hollow of back deeper. Hold ____ seconds. Repeat ____ times per set. Do ____ sets per session. Do ____ sessions per day.  Copyright  VHI. All rights reserved.    Madelyn Flavors, PT 06/13/17 [facility] [place], [place], [address] Phone: [phone]   Fax:  [phone]

## 2017-06-14 ENCOUNTER — Encounter: Payer: Self-pay | Admitting: Physical Therapy

## 2017-06-16 ENCOUNTER — Ambulatory Visit: Payer: BC Managed Care – PPO | Admitting: Physical Therapy

## 2017-06-16 ENCOUNTER — Encounter: Payer: Self-pay | Admitting: Physical Therapy

## 2017-06-16 DIAGNOSIS — R293 Abnormal posture: Secondary | ICD-10-CM

## 2017-06-16 DIAGNOSIS — M545 Low back pain: Principal | ICD-10-CM

## 2017-06-16 DIAGNOSIS — G8929 Other chronic pain: Secondary | ICD-10-CM

## 2017-06-16 NOTE — Therapy (Addendum)
[name], [place], [address] Phone: [phone]   Fax:  [phone]  Physical Therapy Treatment  Patient Details  Name: Roger Guerra MRN: [id number] Date of Birth: 10/12/1955 Referring Provider: Susa Day MD  Encounter Date: 06/16/2017      PT End of Session - 06/16/17 1653    Visit Number 8   Number of Visits 12   Date for PT Re-Evaluation 06/27/17   PT Start Time 1517   PT Stop Time 6160   PT Time Calculation (min) 47 min   Activity Tolerance Patient tolerated treatment well   Behavior During Therapy [facility] for tasks assessed/performed      Past Medical History:  Diagnosis Date  . Anxiety disorder   . Depression   . Erectile dysfunction   . History of DVT of lower extremity    Right leg 2002   . Hyperlipidemia   . Obstructive sleep apnea   . Sleep apnea    On CPAP   . Type 2 diabetes mellitus ([place])     Past Surgical History:  Procedure Laterality Date  . COLONOSCOPY N/A 12/10/2016   Procedure: COLONOSCOPY;  Surgeon: Daneil Dolin, MD;  [facility];  Service: Endoscopy;  Laterality: N/A;  8:30 AM  . POLYPECTOMY  12/10/2016   Procedure: POLYPECTOMY;  Surgeon: Daneil Dolin, MD;  [facility];  Service: Endoscopy;;  hepatic flexure  . TONSILLECTOMY      There were no vitals filed for this visit.      Subjective Assessment - 06/16/17 1649    Subjective Reports that he brought in TENS unit looking for education on where to place electrodes. Patient states that his rib pain is more intense than his low back and he can get it aggravated by turning in bed. Reports that since low back injection that he has not had muscle grabbing. Had to take pain medication earlier today.   Pertinent History See MRI report.   Limitations Standing   How long can you stand comfortably? 10 minutes.   Patient Stated Goals Get out of pain.  "Be able to pick up small items from the floor.    Currently in Pain? Yes   Pain Score 3    Pain Location Back   Pain Descriptors / Indicators Discomfort;Aching   Pain Type Acute pain   Pain Onset More than a month ago            [facility] PT Assessment - 06/16/17 0001      Assessment   Medical Diagnosis Spinal stenosis of lumbar region.   Next MD Visit 06/24/2017     Precautions   Precautions Fall     Restrictions   Weight Bearing Restrictions No                     [facility] Adult PT Treatment/Exercise - 06/16/17 0001      Lumbar Exercises: Aerobic   Stationary Bike Nustep L4 x12 min     Lumbar Exercises: Prone   Straight Leg Raise 15 reps   Other Prone Lumbar Exercises POE x3 min, Prone pressups x10     Knee/Hip Exercises: Stretches   Hip Flexor Stretch Right;3 reps;60 seconds     Modalities   Modalities Electrical Stimulation;Moist Heat     Moist Heat Therapy   Number Minutes Moist Heat 15 Minutes   Moist Heat Location Lumbar Spine     Electrical Stimulation   Electrical Stimulation  Location R distal rib, B low back   Electrical Stimulation Action Pre-Mod   Electrical Stimulation Parameters x15 min   Electrical Stimulation Goals Tone;Pain     Manual Therapy   Manual Therapy Joint mobilization   Joint Mobilization R costovertebral mobilzations completed by Mali Applegate, MPT to reduce R rib pain                     PT Long Term Goals - 05/16/17 1732      PT LONG TERM GOAL #1   Title Independent with a HEP.   Time 4   Period Weeks   Status New     PT LONG TERM GOAL #2   Title Stand 20 minutes with pain not > 2-3/10.   Time 4   Period Weeks   Status New     PT LONG TERM GOAL #3   Title Walk a community distance wiht pain not > 2-3/10.   Time 4   Period Weeks   Status New     PT LONG TERM GOAL #4   Title Eliminate left LE symptoms.   Time 4   Period Weeks   Status New               Plan - 06/16/17 1732    Clinical Impression Statement Patient continues to  arrive in clinic with reports of lumbar pain but especiall R rib pain today. R hip flexor stretch requested by patient today as he is unable to complete at home secondary to lack of proper height furnity. Patient experienced stretch of the R hip flexor and quad. Patient guided through prone exercises as pain reduction noted by patient with prone exercises. Greater pelvic rotation noted with L SLR with patient VC's regarding the importance of lumbopelvic stability. Mali Applegate, MPT completed R costovertebral mobilizations completed with patient reporting relief following end of treatment in the mobilized area. Patient educated regarding set up and electrode placement of TENS unit with normal response of modalities.    Rehab Potential Good   PT Frequency 3x / week   PT Duration 4 weeks   PT Treatment/Interventions ADLs/Self Care Home Management;Cryotherapy;Electrical Stimulation;Moist Heat;Traction;Ultrasound;Therapeutic activities;Therapeutic exercise;Manual techniques;Patient/family education;Dry needling   PT Next Visit Plan Continue with extension protocol, PA mobs to L4/5/S1. Traction as indicated. Continue core strengthening.   PT Home Exercise Plan prone press ups and standing extension. R quad and L piriformis stretches.   Consulted and Agree with Plan of Care Patient      Patient will benefit from skilled therapeutic intervention in order to improve the following deficits and impairments:  Decreased activity tolerance, Postural dysfunction, Pain, Decreased strength, Decreased range of motion  Visit Diagnosis: Chronic midline low back pain, with sciatica presence unspecified  Chronic left-sided low back pain, with sciatica presence unspecified  Abnormal posture     Problem List Patient Active Problem List   Diagnosis Date Noted  . Male hypogonadism 07/24/2015  . Uncontrolled type 2 diabetes mellitus without complication, without long-term current use of insulin ([place]) 07/24/2015   . Hyperlipidemia 07/24/2015    Wynelle Fanny, PTA 06/16/2017, 5:41 PM  [facility] [place], [place], [address] Phone: [phone]   Fax:  [phone]  Name: Roger Guerra MRN: [id number] Date of Birth: 1955/10/31

## 2017-06-21 ENCOUNTER — Encounter: Payer: Self-pay | Admitting: Physical Therapy

## 2017-06-21 ENCOUNTER — Ambulatory Visit: Payer: BC Managed Care – PPO | Admitting: Physical Therapy

## 2017-06-21 DIAGNOSIS — G8929 Other chronic pain: Secondary | ICD-10-CM

## 2017-06-21 DIAGNOSIS — M545 Low back pain: Principal | ICD-10-CM

## 2017-06-21 DIAGNOSIS — R293 Abnormal posture: Secondary | ICD-10-CM

## 2017-06-21 NOTE — Patient Instructions (Addendum)
Piriformis (Supine)    Cross legs, right/left on top. Gently pull other knee toward chest until stretch is felt in buttock/hip of top leg. Hold __30__ seconds. Repeat _3___ times per set. Do ____ sets per session. Do _2-3___ sessions per day.  http://orth.exer.us/676   Copyright  VHI. All rights reserved.

## 2017-06-21 NOTE — Therapy (Signed)
[facility] [place], [place], [address] Phone: [phone]   Fax:  [phone]  Physical Therapy Treatment  Patient Details  Name: Roger Guerra MRN: [id number] Date of Birth: 12/23/1955 Referring Provider: Susa Day MD  Encounter Date: 06/21/2017      PT End of Session - 06/21/17 1637    Visit Number 9   Number of Visits 12   Date for PT Re-Evaluation 06/27/17   PT Start Time 1638   PT Stop Time 1736   PT Time Calculation (min) 58 min   Activity Tolerance Patient tolerated treatment well   Behavior During Therapy [facility] for tasks assessed/performed      Past Medical History:  Diagnosis Date  . Anxiety disorder   . Depression   . Erectile dysfunction   . History of DVT of lower extremity    Right leg 2002   . Hyperlipidemia   . Obstructive sleep apnea   . Sleep apnea    On CPAP   . Type 2 diabetes mellitus ([place])     Past Surgical History:  Procedure Laterality Date  . COLONOSCOPY N/A 12/10/2016   Procedure: COLONOSCOPY;  Surgeon: Daneil Dolin, MD;  [facility];  Service: Endoscopy;  Laterality: N/A;  8:30 AM  . POLYPECTOMY  12/10/2016   Procedure: POLYPECTOMY;  Surgeon: Daneil Dolin, MD;  [facility];  Service: Endoscopy;;  hepatic flexure  . TONSILLECTOMY      There were no vitals filed for this visit.      Subjective Assessment - 06/21/17 1635    Subjective Has experienced reoccurance of muscle spasms and radiating into L hip. Reports trying not to take any medication for it.   Pertinent History See MRI report.   Limitations Standing   How long can you stand comfortably? 10 minutes.   Patient Stated Goals Get out of pain.  "Be able to pick up small items from the floor.   Currently in Pain? Yes   Pain Score 4    Pain Location Back   Pain Orientation Left;Lower   Pain Descriptors / Indicators Discomfort   Pain Type Acute pain   Pain Radiating Towards to L hip    Pain Onset More than a month ago            [facility] PT Assessment - 06/21/17 0001      Assessment   Medical Diagnosis Spinal stenosis of lumbar region.   Next MD Visit 06/24/2017     Precautions   Precautions Fall     Restrictions   Weight Bearing Restrictions No                     [facility] Adult PT Treatment/Exercise - 06/21/17 0001      Lumbar Exercises: Stretches   Standing Extension Other (comment)  x15 reps 2 sec hold   Quad Stretch 3 reps;60 seconds   Quad Stretch Limitations RLE off of table   Piriformis Stretch 2 reps;30 seconds   Piriformis Stretch Limitations Figure 4 BLE     Lumbar Exercises: Aerobic   Stationary Bike Nustep L4 x15 min     Lumbar Exercises: Supine   Bridge 15 reps;3 seconds     Lumbar Exercises: Prone   Single Arm Raise Right;Left;10 reps   Straight Leg Raise 15 reps   Other Prone Lumbar Exercises Prone pressups x20 reps  PT Education - 06/21/17 1732    Education provided Yes   Education Details HEP- Piriformis figure 4 stretch   Person(s) Educated Patient   Methods Explanation;Handout   Comprehension Verbalized understanding             PT Long Term Goals - 05/16/17 1732      PT LONG TERM GOAL #1   Title Independent with a HEP.   Time 4   Period Weeks   Status New     PT LONG TERM GOAL #2   Title Stand 20 minutes with pain not > 2-3/10.   Time 4   Period Weeks   Status New     PT LONG TERM GOAL #3   Title Walk a community distance wiht pain not > 2-3/10.   Time 4   Period Weeks   Status New     PT LONG TERM GOAL #4   Title Eliminate left LE symptoms.   Time 4   Period Weeks   Status New               Plan - 06/21/17 1734    Clinical Impression Statement Patient arrived to treatment with reports of continued lumbar muscle spasms. Patient able to complete exercises in clinic today with no reports of any increased pain. Hip flexor stretch continued for RLE without  complaint. Centralization of pain noted with standing lumbar extension. Improved pelvic control noted with prone LE raises but trunk/pelvic rotation noted with RUE raise but not LUE. Patient provided HEP for Piriformis stretching at home with patient understanding of education. Normal modalities response noted following removal of the modalities.   Rehab Potential Good   PT Frequency 3x / week   PT Duration 4 weeks   PT Treatment/Interventions ADLs/Self Care Home Management;Cryotherapy;Electrical Stimulation;Moist Heat;Traction;Ultrasound;Therapeutic activities;Therapeutic exercise;Manual techniques;Patient/family education;Dry needling   PT Next Visit Plan Continue with extension protocol, PA mobs to L4/5/S1. Traction as indicated. Continue core strengthening.   PT Home Exercise Plan prone press ups and standing extension. R quad and L piriformis stretches.   Consulted and Agree with Plan of Care Patient      Patient will benefit from skilled therapeutic intervention in order to improve the following deficits and impairments:  Decreased activity tolerance, Postural dysfunction, Pain, Decreased strength, Decreased range of motion  Visit Diagnosis: Chronic midline low back pain, with sciatica presence unspecified  Chronic left-sided low back pain, with sciatica presence unspecified  Abnormal posture     Problem List Patient Active Problem List   Diagnosis Date Noted  . Male hypogonadism 07/24/2015  . Uncontrolled type 2 diabetes mellitus without complication, without long-term current use of insulin ([place]) 07/24/2015  . Hyperlipidemia 07/24/2015    Wynelle Fanny, PTA 06/21/2017, 5:43 PM  [facility] [address] [place], [place], [address] Phone: [phone]   Fax:  [phone]  Name: Roger Guerra MRN: [id number] Date of Birth: [birth year]

## 2017-06-23 ENCOUNTER — Encounter: Payer: Self-pay | Admitting: Physical Therapy

## 2017-06-24 ENCOUNTER — Ambulatory Visit: Payer: BC Managed Care – PPO | Admitting: *Deleted

## 2017-06-24 DIAGNOSIS — M545 Low back pain: Principal | ICD-10-CM

## 2017-06-24 DIAGNOSIS — G8929 Other chronic pain: Secondary | ICD-10-CM

## 2017-06-24 DIAGNOSIS — R293 Abnormal posture: Secondary | ICD-10-CM

## 2017-06-24 NOTE — Therapy (Signed)
[facility] [place], [place], [address] Phone: [phone]   Fax:  [phone]  Physical Therapy Treatment  Patient Details  Name: Roger Guerra MRN: [id number] Date of Birth: [birth year] Referring Provider: Susa Day MD  Encounter Date: 06/24/2017      PT End of Session - 06/24/17 0829    Visit Number 10   Number of Visits 12   Date for PT Re-Evaluation 06/27/17   PT Start Time 0815   PT Stop Time 0905   PT Time Calculation (min) 50 min      Past Medical History:  Diagnosis Date  . Anxiety disorder   . Depression   . Erectile dysfunction   . History of DVT of lower extremity    Right leg 2002   . Hyperlipidemia   . Obstructive sleep apnea   . Sleep apnea    On CPAP   . Type 2 diabetes mellitus ([place])     Past Surgical History:  Procedure Laterality Date  . COLONOSCOPY N/A 12/10/2016   Procedure: COLONOSCOPY;  Surgeon: Daneil Dolin, MD;  [facility];  Service: Endoscopy;  Laterality: N/A;  8:30 AM  . POLYPECTOMY  12/10/2016   Procedure: POLYPECTOMY;  Surgeon: Daneil Dolin, MD;  [facility];  Service: Endoscopy;;  hepatic flexure  . TONSILLECTOMY      There were no vitals filed for this visit.      Subjective Assessment - 06/24/17 0825    Subjective Has experienced reoccurance of muscle spasms and radiating into L hip. Reports trying not to take any medication for it. To MD today. Strengthening has helped   Pertinent History See MRI report.   Limitations Standing   How long can you stand comfortably? 10 minutes.   Patient Stated Goals Get out of pain.  "Be able to pick up small items from the floor.   Currently in Pain? Yes   Pain Score 5    Pain Location Back   Pain Orientation Left;Lower   Pain Descriptors / Indicators Discomfort   Pain Type Acute pain   Pain Onset More than a month ago   Pain Frequency Constant                         [facility]  Adult PT Treatment/Exercise - 06/24/17 0001      Lumbar Exercises: Stretches   Standing Extension Other (comment)  x15 reps 2 sec hold     Lumbar Exercises: Aerobic   Stationary Bike Nustep L5 x15 min     Lumbar Exercises: Standing   Shoulder Extension Strengthening;Both;Theraband  XTS Lat pulldowns 2x20 Pink     Lumbar Exercises: Sidelying   Hip Abduction Weights (lbs) 2x10 each side for QL activation     Lumbar Exercises: Prone   Straight Leg Raise 15 reps;3 seconds;20 reps;5 reps  4x10   Other Prone Lumbar Exercises Prone pressups x20 reps     Lumbar Exercises: Quadruped   [place] Horse 10 reps                     PT Long Term Goals - 06/24/17 4315      PT LONG TERM GOAL #1   Title Independent with a HEP.   Time 4   Period Weeks   Status Achieved     PT LONG TERM GOAL #2   Title Stand 20 minutes with pain not > 2-3/10.   Time  4   Period Weeks   Status On-going     PT LONG TERM GOAL #3   Title Walk a community distance wiht pain not > 2-3/10.   Time 4   Period Weeks   Status Achieved     PT LONG TERM GOAL #4   Title Eliminate left LE symptoms.   Time 4   Period Weeks   Status Partially Met  85-90% better               Plan - 06/24/17 0831    Clinical Impression Statement pt arrived today doing fairly well and feels that PT has been helping with less pain in LB and minimal symptoms in LLE now (80-90% decrease LE symptoms). He feels core stabilization and mobility exs have helped the most.. He was able to perform all therex with minimal increase in pain, but had some discomfort on LT side with sidelying hip abduction.Marland Kitchen Rx focused on core strngthening and spinal mobility.   Clinical Presentation Evolving   Clinical Decision Making Low   Rehab Potential Good   PT Frequency 3x / week   PT Duration 4 weeks   PT Treatment/Interventions ADLs/Self Care Home Management;Cryotherapy;Electrical Stimulation;Moist  Heat;Traction;Ultrasound;Therapeutic activities;Therapeutic exercise;Manual techniques;Patient/family education;Dry needling   PT Next Visit Plan Continue with extension protocol, PA mobs to L4/5/S1.    Marland Kitchen Continue core strengthening.  To MD today. He wants to Try DN   PT Home Exercise Plan prone press ups and standing extension. R quad and L piriformis stretches.    Pt wants to Try DN To MD today   Consulted and Agree with Plan of Care Patient      Patient will benefit from skilled therapeutic intervention in order to improve the following deficits and impairments:  Decreased activity tolerance, Postural dysfunction, Pain, Decreased strength, Decreased range of motion  Visit Diagnosis: Chronic midline low back pain, with sciatica presence unspecified  Chronic left-sided low back pain, with sciatica presence unspecified  Abnormal posture     Problem List Patient Active Problem List   Diagnosis Date Noted  . Male hypogonadism 07/24/2015  . Uncontrolled type 2 diabetes mellitus without complication, without long-term current use of insulin ([place]) 07/24/2015  . Hyperlipidemia 07/24/2015   Roger Guerra PTA  Guerra, Roger, MPT 06/24/2017, 10:59 AM  [facility] [address] [place], [place], [address] Phone: [phone]   Fax:  [phone]  Name: Roger Guerra MRN: [id number] Date of Birth: 04-May-1956

## 2017-06-28 ENCOUNTER — Ambulatory Visit: Payer: BC Managed Care – PPO | Attending: Specialist | Admitting: Physical Therapy

## 2017-06-28 ENCOUNTER — Encounter: Payer: Self-pay | Admitting: Physical Therapy

## 2017-06-28 DIAGNOSIS — G8929 Other chronic pain: Secondary | ICD-10-CM

## 2017-06-28 DIAGNOSIS — M545 Low back pain: Secondary | ICD-10-CM | POA: Diagnosis not present

## 2017-06-28 DIAGNOSIS — R293 Abnormal posture: Secondary | ICD-10-CM | POA: Diagnosis present

## 2017-06-28 NOTE — Patient Instructions (Addendum)
Strengthening: Straight Leg Raise (Phase 1)    Tighten muscles on front of right/left thigh along with tightening of core musculature, then lift leg __6__ inches from surface, keeping knee locked.  Repeat __10__ times per set. Do __1-2__ sets per session. Do _2-3___ sessions per day.  http://orth.exer.us/614   Copyright  VHI. All rights reserved.

## 2017-06-28 NOTE — Therapy (Signed)
[facility] [place], [place], [address] Phone: [phone]   Fax:  [phone]  Physical Therapy Treatment  Patient Details  Name: Roger Guerra MRN: [id number] Date of Birth: 06/04/1956 Referring Provider: Susa Day MD  Encounter Date: 06/28/2017      PT End of Session - 06/28/17 1700    Visit Number 11   Number of Visits 24  NO signed by Dr. Tonita Cong on 06/24/2017 for 2-3/week for 4 weeks    Date for PT Re-Evaluation 07/22/17   PT Start Time 1648   PT Stop Time 1751   PT Time Calculation (min) 63 min   Activity Tolerance Patient tolerated treatment well   Behavior During Therapy [facility] for tasks assessed/performed      Past Medical History:  Diagnosis Date  . Anxiety disorder   . Depression   . Erectile dysfunction   . History of DVT of lower extremity    Right leg 2002   . Hyperlipidemia   . Obstructive sleep apnea   . Sleep apnea    On CPAP   . Type 2 diabetes mellitus ([place])     Past Surgical History:  Procedure Laterality Date  . COLONOSCOPY N/A 12/10/2016   Procedure: COLONOSCOPY;  Surgeon: Daneil Dolin, MD;  [facility];  Service: Endoscopy;  Laterality: N/A;  8:30 AM  . POLYPECTOMY  12/10/2016   Procedure: POLYPECTOMY;  Surgeon: Daneil Dolin, MD;  [facility];  Service: Endoscopy;;  hepatic flexure  . TONSILLECTOMY      There were no vitals filed for this visit.      Subjective Assessment - 06/28/17 1648    Subjective Patient reports that estim and heat really helps on his rib region. Patient reports radiation to L hip region.    Pertinent History See MRI report.   Limitations Standing   How long can you stand comfortably? 10 minutes.   Patient Stated Goals Get out of pain.  "Be able to pick up small items from the floor.   Currently in Pain? Yes   Pain Score 4    Pain Location Back   Pain Orientation Left;Lower   Pain Descriptors / Indicators Discomfort   Pain  Type Acute pain   Pain Radiating Towards to L hip   Pain Onset More than a month ago            [facility] PT Assessment - 06/28/17 0001      Assessment   Medical Diagnosis Spinal stenosis of lumbar region.   Next MD Visit 06/2017     Precautions   Precautions Fall     Restrictions   Weight Bearing Restrictions No                     [facility] Adult PT Treatment/Exercise - 06/28/17 0001      Lumbar Exercises: Stretches   Standing Extension 10 seconds  x10 reps     Lumbar Exercises: Aerobic   Stationary Bike Nustep L5 x15 min     Lumbar Exercises: Standing   Row Strengthening;Both;20 reps   Row Limitations Pink XTS with core breathing   Shoulder Extension Strengthening;Both;20 reps   Shoulder Extension Limitations Pink XTS x20 reps   Other Standing Lumbar Exercises Snow angel x10 reps     Lumbar Exercises: Supine   Straight Leg Raise 15 reps   Straight Leg Raises Limitations BLE with core activation     Lumbar Exercises: Prone  Straight Leg Raise 10 reps;3 seconds   Other Prone Lumbar Exercises Prone pressups x10 reps 3 sec hold     Modalities   Modalities Electrical Stimulation;Moist Heat     Moist Heat Therapy   Number Minutes Moist Heat 15 Minutes   Moist Heat Location Lumbar Spine     Electrical Stimulation   Electrical Stimulation Location R distal rib, B low back   Electrical Stimulation Action Pre-Mod   Electrical Stimulation Parameters 80-150 hz x15 min   Electrical Stimulation Goals Pain                PT Education - 06/28/17 1741    Education provided Yes   Education Details HEP- B SLR with core activation   Person(s) Educated Patient   Methods Explanation;Handout   Comprehension Verbalized understanding             PT Long Term Goals - 06/24/17 0837      PT LONG TERM GOAL #1   Title Independent with a HEP.   Time 4   Period Weeks   Status Achieved     PT LONG TERM GOAL #2   Title Stand 20 minutes with pain not  > 2-3/10.   Time 4   Period Weeks   Status On-going     PT LONG TERM GOAL #3   Title Walk a community distance wiht pain not > 2-3/10.   Time 4   Period Weeks   Status Achieved     PT LONG TERM GOAL #4   Title Eliminate left LE symptoms.   Time 4   Period Weeks   Status Partially Met  85-90% better               Plan - 06/28/17 1743    Clinical Impression Statement Patient presented in clinic with continued B low back discomfort along with radiation into L hip. Patient able to tolerate all exercises today well without complaint of pain. Patient did experience one instance of low back pop that patient did not report as painful. Greater emphasis placed on core activation today with breathing as well as core activation techniques. Greater improvement noted in prone SLR stability in pelvis region as patient reports he has been completing exercise at home. Patient educated to attempt a warm up prior to morning exercises to reduce pain. Patient also provided new HEP for SLR with core activation as well as a print out by Mali Applegate, MPT for dry needling. Normal modalities response noted following removal of the modalities.   Rehab Potential Good   PT Frequency 3x / week   PT Duration 4 weeks   PT Treatment/Interventions ADLs/Self Care Home Management;Cryotherapy;Electrical Stimulation;Moist Heat;Traction;Ultrasound;Therapeutic activities;Therapeutic exercise;Manual techniques;Patient/family education;Dry needling   PT Next Visit Plan Continue with extension protocol, PA mobs to L4/5/S1.    Marland Kitchen Continue core strengthening.  To MD today. He wants to Try DN   PT Home Exercise Plan prone press ups and standing extension. R quad and L piriformis stretches.    Pt wants to Try DN To MD today   Consulted and Agree with Plan of Care Patient      Patient will benefit from skilled therapeutic intervention in order to improve the following deficits and impairments:  Decreased activity  tolerance, Postural dysfunction, Pain, Decreased strength, Decreased range of motion  Visit Diagnosis: Chronic midline low back pain, with sciatica presence unspecified  Chronic left-sided low back pain, with sciatica presence unspecified  Abnormal posture  Problem List Patient Active Problem List   Diagnosis Date Noted  . Male hypogonadism 07/24/2015  . Uncontrolled type 2 diabetes mellitus without complication, without long-term current use of insulin ([place]) 07/24/2015  . Hyperlipidemia 07/24/2015    Wynelle Fanny, PTA 06/28/2017, 5:53 PM  [facility] [address] [place], [place], [address] Phone: [phone]   Fax:  [phone]  Name: Louise Victory MRN: [id number] Date of Birth: 1956/05/05

## 2017-06-30 ENCOUNTER — Ambulatory Visit: Payer: BC Managed Care – PPO | Admitting: Physical Therapy

## 2017-06-30 ENCOUNTER — Encounter: Payer: Self-pay | Admitting: Physical Therapy

## 2017-06-30 DIAGNOSIS — M545 Low back pain: Secondary | ICD-10-CM | POA: Diagnosis not present

## 2017-06-30 DIAGNOSIS — G8929 Other chronic pain: Secondary | ICD-10-CM

## 2017-06-30 DIAGNOSIS — R293 Abnormal posture: Secondary | ICD-10-CM

## 2017-06-30 NOTE — Therapy (Signed)
[facility] [place], [place], [address] Phone: [phone]   Fax:  [phone]  Physical Therapy Treatment  Patient Details  Name: Roger Guerra MRN: [id number] Date of Birth: 1956/09/05 Referring Provider: Susa Day MD  Encounter Date: 06/30/2017      PT End of Session - 06/30/17 1653    Visit Number 12   Number of Visits 24   Date for PT Re-Evaluation 07/22/17   PT Start Time 7619   PT Stop Time 1743   PT Time Calculation (min) 59 min   Activity Tolerance Patient tolerated treatment well   Behavior During Therapy [facility] for tasks assessed/performed      Past Medical History:  Diagnosis Date  . Anxiety disorder   . Depression   . Erectile dysfunction   . History of DVT of lower extremity    Right leg 2002   . Hyperlipidemia   . Obstructive sleep apnea   . Sleep apnea    On CPAP   . Type 2 diabetes mellitus ([place])     Past Surgical History:  Procedure Laterality Date  . COLONOSCOPY N/A 12/10/2016   Procedure: COLONOSCOPY;  Surgeon: Daneil Dolin, MD;  [facility];  Service: Endoscopy;  Laterality: N/A;  8:30 AM  . POLYPECTOMY  12/10/2016   Procedure: POLYPECTOMY;  Surgeon: Daneil Dolin, MD;  [facility];  Service: Endoscopy;;  hepatic flexure  . TONSILLECTOMY      There were no vitals filed for this visit.      Subjective Assessment - 06/30/17 1653    Subjective Reports low back is sore and so is his R knee from SLRs.   Pertinent History See MRI report.   Limitations Standing   How long can you stand comfortably? 10 minutes.   Patient Stated Goals Get out of pain.  "Be able to pick up small items from the floor.   Currently in Pain? Yes   Pain Score 3    Pain Location Back   Pain Orientation Left;Lower   Pain Descriptors / Indicators Sore   Pain Type Acute pain   Pain Onset More than a month ago            [facility] PT Assessment - 06/30/17 0001      Assessment   Medical Diagnosis Spinal stenosis of lumbar region.   Next MD Visit 06/2017     Precautions   Precautions Fall     Restrictions   Weight Bearing Restrictions No                     [facility] Adult PT Treatment/Exercise - 06/30/17 0001      Lumbar Exercises: Stretches   Standing Extension 10 seconds  x10 reps   Quad Stretch 3 reps;60 seconds   Quad Stretch Limitations RLE off of table     Lumbar Exercises: Aerobic   Stationary Bike Nustep L4 x12 min     Lumbar Exercises: Standing   Row Strengthening;Both;20 reps   Row Limitations Pink XTS with core drawin   Shoulder Extension Strengthening;Both;20 reps   Shoulder Extension Limitations Pink XTS x20 reps     Lumbar Exercises: Supine   Bridge 20 reps   Straight Leg Raise 10 reps   Straight Leg Raises Limitations BLE with PPT     Lumbar Exercises: Prone   Single Arm Raise Right;Left;10 reps   Straight Leg Raise 10 reps;3 seconds     Modalities  Modalities Electrical Stimulation;Moist Heat     Moist Heat Therapy   Number Minutes Moist Heat 15 Minutes   Moist Heat Location Lumbar Spine     Electrical Stimulation   Electrical Stimulation Location B lmbar paraspinals   Electrical Stimulation Action Pre-Mod   Electrical Stimulation Parameters 80-150 hz x15 min   Electrical Stimulation Goals Pain                     PT Long Term Goals - 06/24/17 6825      PT LONG TERM GOAL #1   Title Independent with a HEP.   Time 4   Period Weeks   Status Achieved     PT LONG TERM GOAL #2   Title Stand 20 minutes with pain not > 2-3/10.   Time 4   Period Weeks   Status On-going     PT LONG TERM GOAL #3   Title Walk a community distance wiht pain not > 2-3/10.   Time 4   Period Weeks   Status Achieved     PT LONG TERM GOAL #4   Title Eliminate left LE symptoms.   Time 4   Period Weeks   Status Partially Met  85-90% better               Plan - 06/30/17 1730    Clinical Impression  Statement Patient presented in clinic with low back soreness and R knee. Patient able to better complete SLR with PPT today. Greater lumbar stability noted with prone exercises at this time. Normal modalities response noted following removal of the modalities.   Rehab Potential Good   PT Frequency 3x / week   PT Duration 4 weeks   PT Treatment/Interventions ADLs/Self Care Home Management;Cryotherapy;Electrical Stimulation;Moist Heat;Traction;Ultrasound;Therapeutic activities;Therapeutic exercise;Manual techniques;Patient/family education;Dry needling   PT Next Visit Plan Continue with extension protocol, PA mobs to L4/5/S1.    Marland Kitchen Continue core strengthening.  To MD today. He wants to Try DN   PT Home Exercise Plan prone press ups and standing extension. R quad and L piriformis stretches.    Pt wants to Try DN To MD today   Consulted and Agree with Plan of Care Patient      Patient will benefit from skilled therapeutic intervention in order to improve the following deficits and impairments:  Decreased activity tolerance, Postural dysfunction, Pain, Decreased strength, Decreased range of motion  Visit Diagnosis: Chronic midline low back pain, with sciatica presence unspecified  Chronic left-sided low back pain, with sciatica presence unspecified  Abnormal posture     Problem List Patient Active Problem List   Diagnosis Date Noted  . Male hypogonadism 07/24/2015  . Uncontrolled type 2 diabetes mellitus without complication, without long-term current use of insulin ([place]) 07/24/2015  . Hyperlipidemia 07/24/2015    Wynelle Fanny, PTA 06/30/2017, 5:49 PM  [facility] [place], [place], [address] Phone: [phone]   Fax:  [phone]  Name: Roger Guerra MRN: [id number] Date of Birth: 1956/06/15

## 2017-07-05 ENCOUNTER — Encounter: Payer: Self-pay | Admitting: Physical Therapy

## 2017-07-05 ENCOUNTER — Ambulatory Visit: Payer: BC Managed Care – PPO | Admitting: Physical Therapy

## 2017-07-05 DIAGNOSIS — R293 Abnormal posture: Secondary | ICD-10-CM

## 2017-07-05 DIAGNOSIS — M545 Low back pain: Secondary | ICD-10-CM | POA: Diagnosis not present

## 2017-07-05 DIAGNOSIS — G8929 Other chronic pain: Secondary | ICD-10-CM

## 2017-07-05 NOTE — Therapy (Signed)
[facility] [place], [place], [address] Phone: [phone]   Fax:  [phone]  Physical Therapy Treatment  Patient Details  Name: Roger Guerra MRN: [id number] Date of Birth: 1956-05-01 Referring Provider: Susa Day MD  Encounter Date: 07/05/2017      PT End of Session - 07/05/17 1658    Visit Number 13   Number of Visits 24   Date for PT Re-Evaluation 07/22/17   PT Start Time 3013   PT Stop Time 1738   PT Time Calculation (min) 54 min   Activity Tolerance Patient tolerated treatment well   Behavior During Therapy [facility] for tasks assessed/performed      Past Medical History:  Diagnosis Date  . Anxiety disorder   . Depression   . Erectile dysfunction   . History of DVT of lower extremity    Right leg 2002   . Hyperlipidemia   . Obstructive sleep apnea   . Sleep apnea    On CPAP   . Type 2 diabetes mellitus ([place])     Past Surgical History:  Procedure Laterality Date  . COLONOSCOPY N/A 12/10/2016   Procedure: COLONOSCOPY;  Surgeon: Daneil Dolin, MD;  [facility];  Service: Endoscopy;  Laterality: N/A;  8:30 AM  . POLYPECTOMY  12/10/2016   Procedure: POLYPECTOMY;  Surgeon: Daneil Dolin, MD;  [facility];  Service: Endoscopy;;  hepatic flexure  . TONSILLECTOMY      There were no vitals filed for this visit.      Subjective Assessment - 07/05/17 1652    Subjective Reports more pain between his shoulder blades which he states the chiropractor usually fixes. Reports low back pain may be from standing in one spot for an hour or more two days in a row at school. Reports that he recently experienced increased L ankle burning which is where LLE symptoms    Pertinent History See MRI report.   Limitations Standing   How long can you stand comfortably? 10 minutes.   Patient Stated Goals Get out of pain.  "Be able to pick up small items from the floor.   Currently in Pain? Yes   Pain  Score 4    Pain Location Back   Pain Orientation Lower   Pain Descriptors / Indicators Discomfort   Pain Type Acute pain   Pain Onset More than a month ago            [facility] PT Assessment - 07/05/17 0001      Assessment   Medical Diagnosis Spinal stenosis of lumbar region.   Next MD Visit 06/2017     Precautions   Precautions Fall     Restrictions   Weight Bearing Restrictions No                     [facility] Adult PT Treatment/Exercise - 07/05/17 0001      Lumbar Exercises: Stretches   Standing Extension Other (comment)  x10 reps 10 sec   Quad Stretch 3 reps;60 seconds   Quad Stretch Limitations RLE off of table     Lumbar Exercises: Aerobic   Stationary Bike Nustep L6 x15 min     Lumbar Exercises: Standing   Shoulder Extension Strengthening;Both;20 reps   Shoulder Extension Limitations Pink XTS with core breathing     Lumbar Exercises: Supine   Bridge 15 reps;Other (comment)  with red theraband hip abduction   Straight Leg Raise 15 reps  Straight Leg Raises Limitations BLE with PPT     Lumbar Exercises: Prone   Opposite Arm/Leg Raise Right arm/Left leg;Left arm/Right leg;15 reps   Other Prone Lumbar Exercises Prone pressups on elbows x10 reps, prone pressups on hands x10 reps     Modalities   Modalities Electrical Stimulation;Moist Heat     Moist Heat Therapy   Number Minutes Moist Heat 15 Minutes   Moist Heat Location Lumbar Spine     Electrical Stimulation   Electrical Stimulation Location B lmbar paraspinals   Electrical Stimulation Action Pre-Mod   Electrical Stimulation Parameters 80-150 hz x15 min   Electrical Stimulation Goals Pain                     PT Long Term Goals - 07/05/17 1656      PT LONG TERM GOAL #1   Title Independent with a HEP.   Time 4   Period Weeks   Status Achieved     PT LONG TERM GOAL #2   Title Stand 20 minutes with pain not > 2-3/10.   Time 4   Period Weeks   Status On-going     PT  LONG TERM GOAL #3   Title Walk a community distance wiht pain not > 2-3/10.   Time 4   Period Weeks   Status Achieved     PT LONG TERM GOAL #4   Title Eliminate left LE symptoms.   Time 4   Period Weeks   Status Partially Met  85-90% better               Plan - 07/05/17 1731    Clinical Impression Statement Patient presented in clinic with reports of LBP from static standing at work for two days consecutively. Patient able to complete all exercises with no reports of any increased LBP. Patient encouraged to complete exercises with core breathing or core activation. SLR is improving with control and strength with PPT. Patient progressed to opposite arm/leg in prone over two pillows with patient reporting ease when combined. Normal modalities response noted following removal of the modalities.   Rehab Potential Good   PT Frequency 3x / week   PT Duration 4 weeks   PT Treatment/Interventions ADLs/Self Care Home Management;Cryotherapy;Electrical Stimulation;Moist Heat;Traction;Ultrasound;Therapeutic activities;Therapeutic exercise;Manual techniques;Patient/family education;Dry needling   PT Next Visit Plan Continue with extension protocol, PA mobs to L4/5/S1.    Marland Kitchen Continue core strengthening.  To MD today. He wants to Try DN   PT Home Exercise Plan prone press ups and standing extension. R quad and L piriformis stretches.    Pt wants to Try DN To MD today   Consulted and Agree with Plan of Care Patient      Patient will benefit from skilled therapeutic intervention in order to improve the following deficits and impairments:  Decreased activity tolerance, Postural dysfunction, Pain, Decreased strength, Decreased range of motion  Visit Diagnosis: Chronic midline low back pain, with sciatica presence unspecified  Chronic left-sided low back pain, with sciatica presence unspecified  Abnormal posture     Problem List Patient Active Problem List   Diagnosis Date Noted  . Male  hypogonadism 07/24/2015  . Uncontrolled type 2 diabetes mellitus without complication, without long-term current use of insulin ([place]) 07/24/2015  . Hyperlipidemia 07/24/2015    Wynelle Fanny, PTA 07/05/2017, 5:44 PM  [facility] [address] [place], [place], [address] Phone: [phone]   Fax:  [phone]  Name: Roger  Guerra MRN: [id number] Date of Birth: 1956/04/28

## 2017-07-12 ENCOUNTER — Encounter: Payer: Self-pay | Admitting: Physical Therapy

## 2017-07-12 ENCOUNTER — Ambulatory Visit: Payer: BC Managed Care – PPO | Admitting: Physical Therapy

## 2017-07-12 DIAGNOSIS — M545 Low back pain: Secondary | ICD-10-CM | POA: Diagnosis not present

## 2017-07-12 DIAGNOSIS — G8929 Other chronic pain: Secondary | ICD-10-CM

## 2017-07-12 DIAGNOSIS — R293 Abnormal posture: Secondary | ICD-10-CM

## 2017-07-12 NOTE — Therapy (Signed)
[facility] [place], [place], [address] Phone: [phone]   Fax:  [phone]  Physical Therapy Treatment  Patient Details  Name: Roger Guerra MRN: [id number] Date of Birth: 09/03/1956 Referring Provider: Susa Day MD  Encounter Date: 07/12/2017      PT End of Session - 07/12/17 1643    Visit Number 14   Number of Visits 24   Date for PT Re-Evaluation 07/22/17   PT Start Time 1636   PT Stop Time 1736   PT Time Calculation (min) 60 min   Activity Tolerance Patient tolerated treatment well   Behavior During Therapy [facility] for tasks assessed/performed      Past Medical History:  Diagnosis Date  . Anxiety disorder   . Depression   . Erectile dysfunction   . History of DVT of lower extremity    Right leg 2002   . Hyperlipidemia   . Obstructive sleep apnea   . Sleep apnea    On CPAP   . Type 2 diabetes mellitus ([place])     Past Surgical History:  Procedure Laterality Date  . COLONOSCOPY N/A 12/10/2016   Procedure: COLONOSCOPY;  Surgeon: Daneil Dolin, MD;  [facility];  Service: Endoscopy;  Laterality: N/A;  8:30 AM  . POLYPECTOMY  12/10/2016   Procedure: POLYPECTOMY;  Surgeon: Daneil Dolin, MD;  [facility];  Service: Endoscopy;;  hepatic flexure  . TONSILLECTOMY      There were no vitals filed for this visit.      Subjective Assessment - 07/12/17 1638    Subjective Reports his overall pain isn't bad today as he had four days off from work and did not do very much per patient report. Patient reports that he goes for MRI 07/15/2017 on his knee.   Pertinent History See MRI report.   Limitations Standing   How long can you stand comfortably? 10 minutes.   Patient Stated Goals Get out of pain.  "Be able to pick up small items from the floor.   Currently in Pain? Yes   Pain Score 1    Pain Location Back   Pain Orientation Lower   Pain Descriptors / Indicators Discomfort   Pain  Type Acute pain   Pain Onset More than a month ago   Pain Score 2   Pain Location Knee   Pain Orientation Left   Pain Descriptors / Indicators Discomfort            [facility] PT Assessment - 07/12/17 0001      Assessment   Medical Diagnosis Spinal stenosis of lumbar region.   Next MD Visit 06/2017     Precautions   Precautions Fall     Restrictions   Weight Bearing Restrictions No                     [facility] Adult PT Treatment/Exercise - 07/12/17 0001      Lumbar Exercises: Aerobic   Stationary Bike NuStep L6 x19 min     Lumbar Exercises: Machines for Strengthening   Cybex Lumbar Extension 70# x20 reps     Lumbar Exercises: Standing   Other Standing Lumbar Exercises B lunging warrior with 2# ball x15 reps each     Lumbar Exercises: Supine   Bridge 15 reps;Other (comment)   Bridge Limitations 15 reps with red clam. x15 reps with 4# adductor squeeze   Straight Leg Raise 20 reps   Straight Leg Raises Limitations  BLE     Lumbar Exercises: Prone   Opposite Arm/Leg Raise Right arm/Left leg;Left arm/Right leg;15 reps   Other Prone Lumbar Exercises Prone pressups on elbows x10 reps, prone pressups on hands x10 reps     Modalities   Modalities Electrical Stimulation;Moist Heat     Moist Heat Therapy   Number Minutes Moist Heat 15 Minutes   Moist Heat Location Lumbar Spine     Electrical Stimulation   Electrical Stimulation Location B lmbar paraspinals   Electrical Stimulation Action IFC   Electrical Stimulation Parameters 1-10 hz x15 min   Electrical Stimulation Goals Pain                     PT Long Term Goals - 07/12/17 1645      PT LONG TERM GOAL #1   Title Independent with a HEP.   Time 4   Period Weeks   Status Achieved     PT LONG TERM GOAL #2   Title Stand 20 minutes with pain not > 2-3/10.   Time 4   Period Weeks   Status On-going     PT LONG TERM GOAL #3   Title Walk a community distance wiht pain not > 2-3/10.   Time 4    Period Weeks   Status Achieved     PT LONG TERM GOAL #4   Title Eliminate left LE symptoms.   Time 4   Period Weeks   Status Achieved  Reported as achieved by patient 07/12/2017               Plan - 07/12/17 1744    Clinical Impression Statement Patient arrived to treatment with low level LBP as well as reporting low level R knee pain. Patient guided through more resistance and stability exercises in standing as well as supine. Patient acknowledged balance challenge with lunging warriors in standing. Normal modalities response noted following removal of the modalities.   Rehab Potential Good   PT Frequency 3x / week   PT Duration 4 weeks   PT Treatment/Interventions ADLs/Self Care Home Management;Cryotherapy;Electrical Stimulation;Moist Heat;Traction;Ultrasound;Therapeutic activities;Therapeutic exercise;Manual techniques;Patient/family education;Dry needling   PT Next Visit Plan Continue with low back strengthening and stability exercises as symptoms dictate per MPT POC.   PT Home Exercise Plan prone press ups and standing extension. R quad and L piriformis stretches.    Pt wants to Try DN To MD today   Consulted and Agree with Plan of Care Patient      Patient will benefit from skilled therapeutic intervention in order to improve the following deficits and impairments:  Decreased activity tolerance, Postural dysfunction, Pain, Decreased strength, Decreased range of motion  Visit Diagnosis: Chronic midline low back pain, with sciatica presence unspecified  Chronic left-sided low back pain, with sciatica presence unspecified  Abnormal posture     Problem List Patient Active Problem List   Diagnosis Date Noted  . Male hypogonadism 07/24/2015  . Uncontrolled type 2 diabetes mellitus without complication, without long-term current use of insulin ([place]) 07/24/2015  . Hyperlipidemia 07/24/2015    Wynelle Fanny, PTA 07/12/2017, 5:48 PM  [facility] [address] [place], [place], [address] Phone: [phone]   Fax:  [phone]  Name: Roger Guerra MRN: [id number] Date of Birth: 1955-11-12

## 2017-07-19 ENCOUNTER — Ambulatory Visit: Payer: BC Managed Care – PPO | Admitting: *Deleted

## 2017-07-19 DIAGNOSIS — R293 Abnormal posture: Secondary | ICD-10-CM

## 2017-07-19 DIAGNOSIS — M545 Low back pain: Principal | ICD-10-CM

## 2017-07-19 DIAGNOSIS — G8929 Other chronic pain: Secondary | ICD-10-CM

## 2017-07-19 NOTE — Therapy (Signed)
[facility] [place], [place], [address] Phone: [phone]   Fax:  [phone]  Physical Therapy Treatment  Patient Details  Name: Roger Guerra MRN: [id number] Date of Birth: 1956/09/12 Referring Provider: Susa Day MD  Encounter Date: 07/19/2017      PT End of Session - 07/19/17 1739    Visit Number 15   Number of Visits 24   Date for PT Re-Evaluation 07/22/17   PT Start Time 1062   PT Stop Time 6948   PT Time Calculation (min) 50 min      Past Medical History:  Diagnosis Date  . Anxiety disorder   . Depression   . Erectile dysfunction   . History of DVT of lower extremity    Right leg 2002   . Hyperlipidemia   . Obstructive sleep apnea   . Sleep apnea    On CPAP   . Type 2 diabetes mellitus ([place])     Past Surgical History:  Procedure Laterality Date  . COLONOSCOPY N/A 12/10/2016   Procedure: COLONOSCOPY;  Surgeon: Daneil Dolin, MD;  [facility];  Service: Endoscopy;  Laterality: N/A;  8:30 AM  . POLYPECTOMY  12/10/2016   Procedure: POLYPECTOMY;  Surgeon: Daneil Dolin, MD;  [facility];  Service: Endoscopy;;  hepatic flexure  . TONSILLECTOMY      There were no vitals filed for this visit.      Subjective Assessment - 07/19/17 1650    Subjective I tried the lunge for  thoracic stretching  at home and both hands went numb and some tingling in  midback.    Pertinent History See MRI report.   Limitations Standing   How long can you stand comfortably? 10 minutes.   Patient Stated Goals Get out of pain.  "Be able to pick up small items from the floor.   Currently in Pain? Yes   Pain Score 1    Pain Location Back   Pain Orientation Lower   Pain Descriptors / Indicators Discomfort   Pain Type Acute pain   Pain Onset More than a month ago   Pain Frequency Constant                         [facility] Adult PT Treatment/Exercise - 07/19/17 0001      Lumbar  Exercises: Aerobic   Stationary Bike NuStep L6 x  56min     Lumbar Exercises: Machines for Strengthening   Cybex Lumbar Extension 70# x20 reps     Lumbar Exercises: Standing   Other Standing Lumbar Exercises B lunging warrior DC due to Bil numbness in 4th and 5th digits   Other Standing Lumbar Exercises ball/wall thoracic extensions with neutral cervical     Lumbar Exercises: Supine   Bridge 15 reps;Other (comment)     Modalities   Modalities Electrical Stimulation;Moist Heat     Moist Heat Therapy   Number Minutes Moist Heat 15 Minutes   Moist Heat Location Lumbar Spine     Electrical Stimulation   Electrical Stimulation Location B lmbar paraspinals   Electrical Stimulation Action IFC   Electrical Stimulation Parameters 80-150hz    Electrical Stimulation Goals Pain                     PT Long Term Goals - 07/12/17 1645      PT LONG TERM GOAL #1   Title Independent with a HEP.  Time 4   Period Weeks   Status Achieved     PT LONG TERM GOAL #2   Title Stand 20 minutes with pain not > 2-3/10.   Time 4   Period Weeks   Status On-going     PT LONG TERM GOAL #3   Title Walk a community distance wiht pain not > 2-3/10.   Time 4   Period Weeks   Status Achieved     PT LONG TERM GOAL #4   Title Eliminate left LE symptoms.   Time 4   Period Weeks   Status Achieved  Reported as achieved by patient 07/12/2017               Plan - 07/19/17 1655    Clinical Impression Statement Pt arrived to Rx today doing fairly well with low pain levels 1-3/10. He reports that while performing warrior lunge at home and both hands started gettng numb. This was performed in clinic with the same results and has been DC at this time. Pt had numbness in 4th and 5th digits in both hands. Ball/waal thoracic extensions were performed and pt did well.   Clinical Presentation Evolving   Clinical Decision Making Low   Rehab Potential Good   PT Frequency 3x / week   PT  Duration 4 weeks   PT Treatment/Interventions ADLs/Self Care Home Management;Cryotherapy;Electrical Stimulation;Moist Heat;Traction;Ultrasound;Therapeutic activities;Therapeutic exercise;Manual techniques;Patient/family education;Dry needling   PT Next Visit Plan Continue with low back strengthening and stability exercises as symptoms dictate per MPT POC.   PT Home Exercise Plan prone press ups and standing extension. R quad and L piriformis stretches.    Pt wants to Try DN To MD today   Consulted and Agree with Plan of Care Patient      Patient will benefit from skilled therapeutic intervention in order to improve the following deficits and impairments:  Decreased activity tolerance, Postural dysfunction, Pain, Decreased strength, Decreased range of motion  Visit Diagnosis: Chronic midline low back pain, with sciatica presence unspecified  Chronic left-sided low back pain, with sciatica presence unspecified  Abnormal posture     Problem List Patient Active Problem List   Diagnosis Date Noted  . Male hypogonadism 07/24/2015  . Uncontrolled type 2 diabetes mellitus without complication, without long-term current use of insulin ([place]) 07/24/2015  . Hyperlipidemia 07/24/2015    RAMSEUR,CHRIS, PTA 07/19/2017, 5:57 PM  [facility] [address] [place], [place], [address] Phone: [phone]   Fax:  [phone]  Name: Roger Guerra MRN: [id number] Date of Birth: 24-Aug-1956

## 2017-08-02 ENCOUNTER — Ambulatory Visit: Payer: BC Managed Care – PPO | Attending: Specialist | Admitting: Physical Therapy

## 2017-08-02 DIAGNOSIS — R293 Abnormal posture: Secondary | ICD-10-CM | POA: Diagnosis present

## 2017-08-02 DIAGNOSIS — G8929 Other chronic pain: Secondary | ICD-10-CM | POA: Diagnosis present

## 2017-08-02 DIAGNOSIS — M545 Low back pain: Secondary | ICD-10-CM | POA: Diagnosis not present

## 2017-08-02 NOTE — Therapy (Signed)
[facility] [place], [place], [address] Phone: [phone]   Fax:  [phone]  Physical Therapy Treatment  Patient Details  Name: Roger Guerra MRN: [id number] Date of Birth: 03/21/1956 Referring Provider: Susa Day MD   Encounter Date: 08/02/2017  PT End of Session - 08/02/17 0905    Visit Number  16    Number of Visits  24    Date for PT Re-Evaluation  08/30/17    PT Start Time  0905    PT Stop Time  0958    PT Time Calculation (min)  53 min    Activity Tolerance  Patient tolerated treatment well    Behavior During Therapy  [facility] for tasks assessed/performed       Past Medical History:  Diagnosis Date  . Anxiety disorder   . Depression   . Erectile dysfunction   . History of DVT of lower extremity    Right leg 2002   . Hyperlipidemia   . Obstructive sleep apnea   . Sleep apnea    On CPAP   . Type 2 diabetes mellitus ([place])     Past Surgical History:  Procedure Laterality Date  . TONSILLECTOMY      There were no vitals filed for this visit.  Subjective Assessment - 08/02/17 0905    Subjective  Patient reports his back is better in general since starting therapy.    Pertinent History  See MRI report.    Patient Stated Goals  Get out of pain.  "Be able to pick up small items from the floor.    Currently in Pain?  Yes    Pain Score  1     Pain Location  Back    Pain Orientation  Left    Pain Descriptors / Indicators  Discomfort    Pain Type  Acute pain    Pain Radiating Towards  to L hip and intermittently to knee and ankle    Pain Onset  More than a month ago    Pain Frequency  Constant    Aggravating Factors   prolonged anything (sitting/standing/walking)    Pain Relieving Factors  lying on R side         [facility] PT Assessment - 08/02/17 0001      Posture/Postural Control   Posture Comments  tight left QL with resultant left shoulder/scapula depression; level iliac crests                   [facility] Adult PT Treatment/Exercise - 08/02/17 0001      Self-Care   Self-Care  Other Self-Care Comments    Other Self-Care Comments   DN education and aftercare as well as MFR using tennis ball      Lumbar Exercises: Aerobic   Stationary Bike  NuStep L6 x  14 min      Knee/Hip Exercises: Stretches   ITB Stretch  Left;1 rep;30 seconds over pillow for QL stretch   over pillow for QL stretch   Piriformis Stretch  Left;1 rep;30 seconds figure 4   figure 4     Modalities   Modalities  Electrical Stimulation;Moist Heat      Moist Heat Therapy   Number Minutes Moist Heat  15 Minutes    Moist Heat Location  Lumbar Spine;Hip      Electrical Stimulation   Electrical Stimulation Location  l lumbar and gluteals    Electrical Stimulation Action  IFC    Electrical Stimulation Parameters  80-150 Hz x 15 min    Electrical Stimulation Goals  Pain      Manual Therapy   Manual Therapy  Soft tissue mobilization    Soft tissue mobilization  to L gluteals and L lumbar/QL       Trigger Point Dry Needling - 08/02/17 1057    Consent Given?  Yes    Education Handout Provided  Yes    Muscles Treated Upper Body  Quadratus Lumborum    Muscles Treated Lower Body  Gluteus minimus;Gluteus maximus;Piriformis L side and glut med   L side and glut med   Gluteus Maximus Response  Twitch response elicited;Palpable increased muscle length    Gluteus Minimus Response  Twitch response elicited;Palpable increased muscle length    Piriformis Response  Twitch response elicited;Palpable increased muscle length           PT Education - 08/02/17 1059    Education provided  Yes    Education Details  HEP; self care    Person(s) Educated  Patient    Methods  Explanation;Demonstration;Handout    Comprehension  Verbalized understanding;Returned demonstration          PT Long Term Goals - 08/02/17 0908      PT LONG TERM GOAL #1   Title  Independent with a HEP.    Time  4     Period  Weeks    Status  Achieved      PT LONG TERM GOAL #2   Title  Stand 20 minutes with pain not > 2-3/10.    Baseline  5/10 with standing beyond 5 min    Time  4    Period  Weeks    Status  On-going      PT LONG TERM GOAL #3   Title  Walk a community distance wiht pain not > 2-3/10.    Baseline  able to walk at a quick pace without problem.    Time  4    Period  Weeks    Status  Achieved      PT LONG TERM GOAL #4   Title  Eliminate left LE symptoms.    Baseline  significantly improved    Time  4    Period  Weeks    Status  On-going            Plan - 08/02/17 1100    Clinical Impression Statement  Patient responded very well to TPDN today after being educated on it and giving consent. He had ++twitch responses in gluteals and piriformis and reported relief by end of treatment. Patient is progressing overall with pain relief, but continues to experience intermittent LE symptoms. He will likely benefit from a few more sessions of DN.     Rehab Potential  Good    PT Frequency  3x / week    PT Duration  4 weeks    PT Treatment/Interventions  ADLs/Self Care Home Management;Cryotherapy;Electrical Stimulation;Moist Heat;Traction;Ultrasound;Therapeutic activities;Therapeutic exercise;Manual techniques;Patient/family education;Dry needling    PT Next Visit Plan  Assess DN, continue with flexibility, strenghening and stability to meet remaining LTGs.    PT Home Exercise Plan  prone press ups and standing extension. R quad and L piriformis stretches.    Pt wants to Try DN To MD today    Consulted and Agree with Plan of Care  Patient       Patient will benefit from skilled therapeutic intervention in order to improve the following deficits and impairments:  Decreased activity  tolerance, Postural dysfunction, Pain, Decreased strength, Decreased range of motion  Visit Diagnosis: Chronic midline low back pain, with sciatica presence unspecified - Plan: PT plan of care  cert/re-cert  Chronic left-sided low back pain, with sciatica presence unspecified - Plan: PT plan of care cert/re-cert  Abnormal posture - Plan: PT plan of care cert/re-cert     Problem List Patient Active Problem List   Diagnosis Date Noted  . Male hypogonadism 07/24/2015  . Uncontrolled type 2 diabetes mellitus without complication, without long-term current use of insulin ([place]) 07/24/2015  . Hyperlipidemia 07/24/2015    Madelyn Flavors PT 08/02/2017, 11:08 AM  [facility] [address] [place], [place], [address] Phone: [phone]   Fax:  [phone]  Name: Butch Otterson MRN: [id number] Date of Birth: Jun 17, 1956

## 2017-08-02 NOTE — Patient Instructions (Signed)
[facility]  DRY NEEDLING CONSENT FORM   Trigger point dry needling is a physical therapy approach to treat Myofascial Pain and Dysfunction.  Dry Needling (DN) is a valuable and effective way to deactivate myofascial trigger points (muscle knots). It is skilled intervention that uses a thin filiform needle to penetrate the skin and stimulate underlying myofascial trigger points, muscular, and connective tissues for the management of neuromusculoskeletal pain and movement impairments.  A local twitch response (LTR) will be elicited.  This can sometimes feel like a deep ache in the muscle during the procedure. Multiple trigger points in multiple muscles can be treated during each treatment.  No medication of any kind is injected.   As with any medical treatment and procedure, there are possible adverse events.  While significant adverse events are uncommon, they do sometimes occur and must be considered prior to giving consent.  1. Dry needling often causes a "post needling soreness".  There can be an increase in pain from a couple of hours to 2-3 days, followed by an improvement in the overall pain state. 2. Any time a needle is used there is a risk of infection.  However, we are using new, sterile, and disposable needles; infections are extremely rare. 3. There is a possibility that you may bleed or bruise.  You may feel tired and some nausea following treatment. 4. There is a rare possibility of a pneumothorax (air in the chest cavity). 5. Allergic reaction to nickel in the stainless steel needle. 6. If a nerve is touched, it may cause paresthesia (a prickling/shock sensation) which is usually brief, but may continue for a couple of days.  Following treatment stay hydrated.  Continue regular activities but not too vigorous initially after treatment for 24-48 hours.  Dry Needling is best when combined with other physical therapy interventions such as strengthening,  stretching and other therapeutic modalities.     PLEASE ANSWER THE FOLLOWING QUESTIONS:  Do you have a lack of sensation?   Y/N  Do you have a phobia or fear of needles  Y/N  Are you pregnant?    Y/N If yes:  How many weeks? _____  Do you have any implanted devices?  Y/N If yes:  Pacemaker/Spinal Cord         Stimulator/Deep Brain         Stimulator/Insulin          Pump/Other: ____________ Do you have any implants?   Y/N If yes:      Do you take any blood thinners?   Y/N If yes: Coumadin          (Warfarin)/Other:  Do you have a bleeding disorder?   Y/N If yes: What kind:   Do you take any immunosuppressants?  Y/N If yes:   What kind:   Do you take anti-inflammatories?   Y/N If yes: What kind:  Have you ever been diagnosed with Scoliosis? Y/N  Have you had back surgery?    Y/N If yes:         Laminectomy/Fusion/Other:   I have read, or had read to me, the above.  I have had the opportunity to ask any questions.  All of my questions have been answered to my satisfaction and I understand the risks involved with dry needling.  I consent to examination and treatment at [facility], including dry needling, of any and all of my involved and affected muscles.   Piriformis (Supine) - place  bottom foot against a wall to make it easier  Cross legs, right on top. Gently pull other knee toward chest until stretch is felt in buttock/hip of top leg. Hold __30-60__ seconds. Repeat _3___ times per set. Do __2__ sets per session. Do ____ sessions per day.  Hip Stretch  Put right ankle over left knee. Let right knee fall downward, but keep ankle in place. Feel the stretch in hip. May push down gently with hand to feel stretch. Hold 30-60____ seconds while counting out loud. Repeat with other leg. Repeat __3__ times. Do _2___ sessions per day.  Iliotibial Band Stretch, Side-Lying   Lie on side, back to edge of bed, top arm in front and pillow under Right  side at waist. Allow top leg to drape behind over edge. Hold 1-10 min.  1-2 x/day.    Madelyn Flavors, PT 08/02/17 [facility] [place], [place], [address] Phone: [phone]   Fax:  [phone]

## 2017-08-05 NOTE — Progress Notes (Signed)
Please place orders in Epic as patient is being scheduled for a pre-op appointment! Thank you! 

## 2017-08-09 ENCOUNTER — Ambulatory Visit: Payer: BC Managed Care – PPO | Admitting: *Deleted

## 2017-08-09 DIAGNOSIS — M545 Low back pain: Principal | ICD-10-CM

## 2017-08-09 DIAGNOSIS — R293 Abnormal posture: Secondary | ICD-10-CM

## 2017-08-09 DIAGNOSIS — G8929 Other chronic pain: Secondary | ICD-10-CM

## 2017-08-09 NOTE — Therapy (Signed)
[facility] [place], [place], [address] Phone: [phone]   Fax:  [phone]  Physical Therapy Treatment  Patient Details  Name: Roger Guerra MRN: [id number] Date of Birth: 07-Jan-1956 Referring Provider: Susa Day MD   Encounter Date: 08/09/2017  PT End of Session - 08/09/17 1657    Visit Number  17    Number of Visits  24    Date for PT Re-Evaluation  08/30/17    PT Start Time  5631    PT Stop Time  1736    PT Time Calculation (min)  51 min       Past Medical History:  Diagnosis Date  . Anxiety disorder   . Depression   . Erectile dysfunction   . History of DVT of lower extremity    Right leg 2002   . Hyperlipidemia   . Obstructive sleep apnea   . Sleep apnea    On CPAP   . Type 2 diabetes mellitus ([place])     Past Surgical History:  Procedure Laterality Date  . TONSILLECTOMY      There were no vitals filed for this visit.  Subjective Assessment - 08/09/17 1654    Subjective  Patient reports his back is better in general since starting therapy.DN  DId good for the LB muscle, but not the hip pain.    Pertinent History  See MRI report.    Limitations  Standing    How long can you stand comfortably?  10 minutes.    Patient Stated Goals  Get out of pain.  "Be able to pick up small items from the floor.    Currently in Pain?  Yes    Pain Score  4     Pain Location  Back    Pain Orientation  Left    Pain Descriptors / Indicators  Discomfort    Pain Onset  More than a month ago                      [facility] Adult PT Treatment/Exercise - 08/09/17 0001      Lumbar Exercises: Aerobic   Stationary Bike  NuStep L6 x  72min      Lumbar Exercises: Machines for Strengthening   Cybex Lumbar Extension  70# x20 reps      Lumbar Exercises: Seated   Long Arc Quad on Chair  Left;2 sets 3# x20, 4# x20 pause at top      Knee/Hip Exercises: Stretches   ITB Stretch  Left;1 rep over pillow for  QL stretch x 5 mins    Piriformis Stretch  Left;30 seconds;5 reps figure 4      Modalities   Modalities  Electrical Stimulation;Moist Heat      Moist Heat Therapy   Number Minutes Moist Heat  15 Minutes    Moist Heat Location  Lumbar Spine;Hip      Electrical Stimulation   Electrical Stimulation Location  l lumbar and gluteals  IFC x15 mins 80-150hz     Electrical Stimulation Goals  Pain                  PT Long Term Goals - 08/02/17 0908      PT LONG TERM GOAL #1   Title  Independent with a HEP.    Time  4    Period  Weeks    Status  Achieved      PT LONG TERM GOAL #2   Title  Stand 20 minutes with pain not > 2-3/10.    Baseline  5/10 with standing beyond 5 min    Time  4    Period  Weeks    Status  On-going      PT LONG TERM GOAL #3   Title  Walk a community distance wiht pain not > 2-3/10.    Baseline  able to walk at a quick pace without problem.    Time  4    Period  Weeks    Status  Achieved      PT LONG TERM GOAL #4   Title  Eliminate left LE symptoms.    Baseline  significantly improved    Time  4    Period  Weeks    Status  On-going            Plan - 08/09/17 1742    Clinical Impression Statement  Pt arrived today after DN treatment last visit and feels that it helped with muscle pain and tightness, but not hip pain. He did well with therex for strengthenig and LE stretching. Piriformis stretch was performed supine with just LT LE. He will continue with PT this week , but has a surgry scheduled for his knee in a week or so.    Clinical Presentation  Evolving    Rehab Potential  Good    PT Frequency  3x / week    PT Duration  4 weeks    PT Treatment/Interventions  ADLs/Self Care Home Management;Cryotherapy;Electrical Stimulation;Moist Heat;Traction;Ultrasound;Therapeutic activities;Therapeutic exercise;Manual techniques;Patient/family education;Dry needling    PT Next Visit Plan  Assess DN, continue with flexibility, strenghening and  stability to meet remaining LTGs.    PT Home Exercise Plan  prone press ups and standing extension. R quad and L piriformis stretches.    Pt wants to Try DN To MD today    Consulted and Agree with Plan of Care  Patient       Patient will benefit from skilled therapeutic intervention in order to improve the following deficits and impairments:  Decreased activity tolerance, Postural dysfunction, Pain, Decreased strength, Decreased range of motion  Visit Diagnosis: Chronic midline low back pain, with sciatica presence unspecified  Chronic left-sided low back pain, with sciatica presence unspecified  Abnormal posture     Problem List Patient Active Problem List   Diagnosis Date Noted  . Male hypogonadism 07/24/2015  . Uncontrolled type 2 diabetes mellitus without complication, without long-term current use of insulin ([place]) 07/24/2015  . Hyperlipidemia 07/24/2015    RAMSEUR,CHRIS, PTA 08/09/2017, 5:49 PM  [facility] [address] [place], [place], [address] Phone: [phone]   Fax:  [phone]  Name: Roger Guerra MRN: [id number] Date of Birth: [birth year]

## 2017-08-11 ENCOUNTER — Ambulatory Visit: Payer: BC Managed Care – PPO | Admitting: *Deleted

## 2017-08-11 DIAGNOSIS — M545 Low back pain: Principal | ICD-10-CM

## 2017-08-11 DIAGNOSIS — G8929 Other chronic pain: Secondary | ICD-10-CM

## 2017-08-11 DIAGNOSIS — R293 Abnormal posture: Secondary | ICD-10-CM

## 2017-08-11 NOTE — Therapy (Signed)
[facility] [place], [place], [address] Phone: [phone]   Fax:  [phone]  Physical Therapy Treatment  Patient Details  Name: Roger Guerra MRN: [id number] Date of Birth: 03-07-1956 Referring Provider: Susa Day MD   Encounter Date: 08/11/2017  PT End of Session - 08/11/17 1700    Visit Number  18    Number of Visits  24    Date for PT Re-Evaluation  08/30/17    PT Start Time  3291    PT Stop Time  9166    PT Time Calculation (min)  50 min       Past Medical History:  Diagnosis Date  . Anxiety disorder   . Depression   . Erectile dysfunction   . History of DVT of lower extremity    Right leg 2002   . Hyperlipidemia   . Obstructive sleep apnea   . Sleep apnea    On CPAP   . Type 2 diabetes mellitus ([place])     Past Surgical History:  Procedure Laterality Date  . COLONOSCOPY N/A 12/10/2016   Procedure: COLONOSCOPY;  Surgeon: Daneil Dolin, MD;  [facility];  Service: Endoscopy;  Laterality: N/A;  8:30 AM  . POLYPECTOMY  12/10/2016   Procedure: POLYPECTOMY;  Surgeon: Daneil Dolin, MD;  Location: [facility];  Service: Endoscopy;;  hepatic flexure  . TONSILLECTOMY      There were no vitals filed for this visit.  Subjective Assessment - 08/11/17 1659    Subjective  Did good after last Rx.    Pertinent History  See MRI report.    Limitations  Standing    How long can you stand comfortably?  10 minutes.    Patient Stated Goals  Get out of pain.  "Be able to pick up small items from the floor.    Currently in Pain?  Yes    Pain Score  4     Pain Location  Back    Pain Orientation  Left    Pain Descriptors / Indicators  Discomfort    Pain Type  Acute pain    Pain Onset  More than a month ago    Pain Frequency  Constant                      [facility] Adult PT Treatment/Exercise - 08/11/17 0001      Lumbar Exercises: Aerobic   Stationary Bike  NuStep L6 x  69mn      Lumbar Exercises: Machines for Strengthening   Cybex Lumbar Extension  70# x20 reps      Lumbar Exercises: Seated   Long Arc Quad on Chair  Left;2 sets 3# x20, 5# x20 pause at top      Knee/Hip Exercises: Stretches   Piriformis Stretch  Left;30 seconds;5 reps figure 4      Modalities   Modalities  Electrical Stimulation;Moist Heat      Moist Heat Therapy   Number Minutes Moist Heat  15 Minutes    Moist Heat Location  Lumbar Spine;Hip      Electrical Stimulation   Electrical Stimulation Location  l lumbar and gluteals  IFC x15 mins 80-'150hz'     Electrical Stimulation Goals  Pain                  PT Long Term Goals - 08/11/17 1730      PT LONG TERM GOAL #2   Title  Stand  20 minutes with pain not > 2-3/10.    Baseline  5/10 with standing beyond 5 min    Time  4    Period  Weeks    Status  Achieved            Plan - 08/11/17 1731    Clinical Impression Statement  Pt arrived today doing fairly well with minimal LBP2/10.  He was a ble to perform core and stretching therex with mainly soreness. He will continue with LB PT untilo RT knee surgery.. All STGs met    Rehab Potential  Good    PT Frequency  3x / week    PT Duration  4 weeks    PT Treatment/Interventions  ADLs/Self Care Home Management;Cryotherapy;Electrical Stimulation;Moist Heat;Traction;Ultrasound;Therapeutic activities;Therapeutic exercise;Manual techniques;Patient/family education;Dry needling    PT Next Visit Plan  Assess DN, continue with flexibility, strenghening and stability to meet remaining LTGs.    PT Home Exercise Plan  prone press ups and standing extension. R quad and L piriformis stretches.    Pt wants to Try DN To MD today       Patient will benefit from skilled therapeutic intervention in order to improve the following deficits and impairments:  Decreased activity tolerance, Postural dysfunction, Pain, Decreased strength, Decreased range of motion  Visit Diagnosis: Chronic midline low  back pain, with sciatica presence unspecified  Chronic left-sided low back pain, with sciatica presence unspecified  Abnormal posture     Problem List Patient Active Problem List   Diagnosis Date Noted  . Male hypogonadism 07/24/2015  . Uncontrolled type 2 diabetes mellitus without complication, without long-term current use of insulin ([place]) 07/24/2015  . Hyperlipidemia 07/24/2015    Roger Guerra,Roger Guerra, Roger Guerra 08/11/2017, 5:46 PM  [facility] [address] [place], [place], [address] Phone: [phone]   Fax:  [phone]  Name: Roger Guerra MRN: [id number] Date of Birth: 10/26/1955

## 2017-08-15 NOTE — Progress Notes (Signed)
Please place orders in Epic as patient has a pre-op appointment on 08/22/2017! Thank you!

## 2017-08-16 ENCOUNTER — Ambulatory Visit: Payer: Self-pay | Admitting: Orthopedic Surgery

## 2017-08-17 ENCOUNTER — Encounter (HOSPITAL_COMMUNITY): Payer: Self-pay

## 2017-08-17 NOTE — Patient Instructions (Addendum)
Farris Geiman  08/17/2017   Your procedure is scheduled on: Thursday, Nov. 29, 2018   Report to [facility] Main  Entrance   Take [place]  elevators to 3rd floor to  [place] at 5:30 AM.    Call this number if you have problems the morning of surgery [phone]    Remember: ONLY 1 PERSON MAY GO WITH YOU TO SHORT STAY TO GET  READY MORNING OF [place].   Do not eat food or drink liquids :After Midnight.    Take these medicines the morning of surgery with A SIP OF WATER: Omeprazole   DO NOT TAKE ANY DIABETIC MEDICATIONS DAY OF YOUR SURGERY   Bring CPAP mask and tubing only day of surgery                               You may not have any metal on your body including jewelry              Do not wear lotions, powders, perfumes, or deodorant                          Men may shave face and neck.   Do not bring valuables to the hospital. [place].   Contacts, dentures or bridgework may not be worn into surgery.    Patients discharged the day of surgery will not be allowed to drive home.   Name and phone number of your driver:  Special Instructions: N/A              Please read over the following fact sheets you were given: _____________________________________________________________________             [facility] - Preparing for Surgery Before surgery, you can play an important role.  Because skin is not sterile, your skin needs to be as free of germs as possible.  You can reduce the number of germs on your skin by washing with CHG (chlorahexidine gluconate) soap before surgery.  CHG is an antiseptic cleaner which kills germs and bonds with the skin to continue killing germs even after washing. Please DO NOT use if you have an allergy to CHG or antibacterial soaps.  If your skin becomes reddened/irritated stop using the CHG and inform your nurse when you arrive at Short Stay. Do not shave  (including legs and underarms) for at least 48 hours prior to the first CHG shower.  You may shave your face/neck.  Please follow these instructions carefully:  1.  Shower with CHG Soap the night before surgery and the  morning of surgery.  2.  If you choose to wash your hair, wash your hair first as usual with your normal  shampoo.  3.  After you shampoo, rinse your hair and body thoroughly to remove the shampoo.                             4.  Use CHG as you would any other liquid soap.  You can apply chg directly to the skin and wash.  Gently with a scrungie or clean washcloth.  5.  Apply the CHG Soap to  your body ONLY FROM THE NECK DOWN.   Do   not use on face/ open                           Wound or open sores. Avoid contact with eyes, ears mouth and   genitals (private parts).                       Wash face,  Genitals (private parts) with your normal soap.             6.  Wash thoroughly, paying special attention to the area where your    surgery  will be performed.  7.  Thoroughly rinse your body with warm water from the neck down.  8.  DO NOT shower/wash with your normal soap after using and rinsing off the CHG Soap.                9.  Pat yourself dry with a clean towel.            10.  Wear clean pajamas.            11.  Place clean sheets on your bed the night of your first shower and do not  sleep with pets. Day of Surgery : Do not apply any lotions/deodorants the morning of surgery.  Please wear clean clothes to the hospital/surgery center.  FAILURE TO FOLLOW THESE INSTRUCTIONS MAY RESULT IN THE CANCELLATION OF YOUR SURGERY  PATIENT SIGNATURE_________________________________  NURSE SIGNATURE__________________________________  ________________________________________________________________________   Adam Phenix  An incentive spirometer is a tool that can help keep your lungs clear and active. This tool measures how well you are filling your lungs with each breath.  Taking long deep breaths may help reverse or decrease the chance of developing breathing (pulmonary) problems (especially infection) following:  A long period of time when you are unable to move or be active. BEFORE THE PROCEDURE   If the spirometer includes an indicator to show your best effort, your nurse or respiratory therapist will set it to a desired goal.  If possible, sit up straight or lean slightly forward. Try not to slouch.  Hold the incentive spirometer in an upright position. INSTRUCTIONS FOR USE  1. Sit on the edge of your bed if possible, or sit up as far as you can in bed or on a chair. 2. Hold the incentive spirometer in an upright position. 3. Breathe out normally. 4. Place the mouthpiece in your mouth and seal your lips tightly around it. 5. Breathe in slowly and as deeply as possible, raising the piston or the ball toward the top of the column. 6. Hold your breath for 3-5 seconds or for as long as possible. Allow the piston or ball to fall to the bottom of the column. 7. Remove the mouthpiece from your mouth and breathe out normally. 8. Rest for a few seconds and repeat Steps 1 through 7 at least 10 times every 1-2 hours when you are awake. Take your time and take a few normal breaths between deep breaths. 9. The spirometer may include an indicator to show your best effort. Use the indicator as a goal to work toward during each repetition. 10. After each set of 10 deep breaths, practice coughing to be sure your lungs are clear. If you have an incision (the cut made at the time of surgery), support your incision when coughing by placing a  pillow or rolled up towels firmly against it. Once you are able to get out of bed, walk around indoors and cough well. You may stop using the incentive spirometer when instructed by your caregiver.  RISKS AND COMPLICATIONS  Take your time so you do not get dizzy or light-headed.  If you are in pain, you may need to take or ask for pain  medication before doing incentive spirometry. It is harder to take a deep breath if you are having pain. AFTER USE  Rest and breathe slowly and easily.  It can be helpful to keep track of a log of your progress. Your caregiver can provide you with a simple table to help with this. If you are using the spirometer at home, follow these instructions: [place] IF:   You are having difficultly using the spirometer.  You have trouble using the spirometer as often as instructed.  Your pain medication is not giving enough relief while using the spirometer.  You develop fever of 100.5 F (38.1 C) or higher. SEEK IMMEDIATE MEDICAL CARE IF:   You cough up bloody sputum that had not been present before.  You develop fever of 102 F (38.9 C) or greater.  You develop worsening pain at or near the incision site. MAKE SURE YOU:   Understand these instructions.  Will watch your condition.  Will get help right away if you are not doing well or get worse. Document Released: 01/24/2007 Document Revised: 12/06/2011 Document Reviewed: 03/27/2007 [facility] Patient Information 2014 [place], [place].   ________________________________________________________________________

## 2017-08-17 NOTE — Pre-Procedure Instructions (Signed)
Hgb A 1 C (5.7) 07/21/17 results in patient's chart.

## 2017-08-22 ENCOUNTER — Encounter (HOSPITAL_COMMUNITY): Payer: Self-pay

## 2017-08-22 ENCOUNTER — Other Ambulatory Visit: Payer: Self-pay

## 2017-08-22 ENCOUNTER — Encounter (HOSPITAL_COMMUNITY)
Admission: RE | Admit: 2017-08-22 | Discharge: 2017-08-22 | Disposition: A | Discharge status: Home / Self Care | Payer: BC Managed Care – PPO | Source: Ambulatory Visit | Attending: Specialist | Admitting: Specialist

## 2017-08-22 DIAGNOSIS — X58XXXA Exposure to other specified factors, initial encounter: Secondary | ICD-10-CM

## 2017-08-22 DIAGNOSIS — G4733 Obstructive sleep apnea (adult) (pediatric): Secondary | ICD-10-CM | POA: Diagnosis not present

## 2017-08-22 DIAGNOSIS — K219 Gastro-esophageal reflux disease without esophagitis: Secondary | ICD-10-CM | POA: Diagnosis not present

## 2017-08-22 DIAGNOSIS — Z87891 Personal history of nicotine dependence: Secondary | ICD-10-CM | POA: Diagnosis not present

## 2017-08-22 DIAGNOSIS — M1712 Unilateral primary osteoarthritis, left knee: Secondary | ICD-10-CM | POA: Insufficient documentation

## 2017-08-22 DIAGNOSIS — Z86718 Personal history of other venous thrombosis and embolism: Secondary | ICD-10-CM | POA: Diagnosis not present

## 2017-08-22 DIAGNOSIS — M23203 Derangement of unspecified medial meniscus due to old tear or injury, right knee: Secondary | ICD-10-CM | POA: Diagnosis present

## 2017-08-22 DIAGNOSIS — Z01810 Encounter for preprocedural cardiovascular examination: Secondary | ICD-10-CM | POA: Insufficient documentation

## 2017-08-22 DIAGNOSIS — E785 Hyperlipidemia, unspecified: Secondary | ICD-10-CM | POA: Diagnosis not present

## 2017-08-22 DIAGNOSIS — Z79899 Other long term (current) drug therapy: Secondary | ICD-10-CM | POA: Diagnosis not present

## 2017-08-22 DIAGNOSIS — M2241 Chondromalacia patellae, right knee: Secondary | ICD-10-CM | POA: Diagnosis not present

## 2017-08-22 DIAGNOSIS — Z01812 Encounter for preprocedural laboratory examination: Secondary | ICD-10-CM | POA: Insufficient documentation

## 2017-08-22 DIAGNOSIS — Z7982 Long term (current) use of aspirin: Secondary | ICD-10-CM | POA: Diagnosis not present

## 2017-08-22 DIAGNOSIS — E669 Obesity, unspecified: Secondary | ICD-10-CM | POA: Diagnosis not present

## 2017-08-22 DIAGNOSIS — S83241A Other tear of medial meniscus, current injury, right knee, initial encounter: Secondary | ICD-10-CM | POA: Insufficient documentation

## 2017-08-22 DIAGNOSIS — F329 Major depressive disorder, single episode, unspecified: Secondary | ICD-10-CM | POA: Diagnosis not present

## 2017-08-22 DIAGNOSIS — F419 Anxiety disorder, unspecified: Secondary | ICD-10-CM | POA: Diagnosis not present

## 2017-08-22 DIAGNOSIS — E119 Type 2 diabetes mellitus without complications: Secondary | ICD-10-CM | POA: Diagnosis not present

## 2017-08-22 HISTORY — DX: Nerve root and plexus disorder, unspecified: G54.9

## 2017-08-22 HISTORY — DX: Spondylosis, unspecified: M47.9

## 2017-08-22 HISTORY — DX: Obesity, unspecified: E66.9

## 2017-08-22 HISTORY — DX: Gastro-esophageal reflux disease without esophagitis: K21.9

## 2017-08-22 HISTORY — DX: Sciatica, unspecified side: M54.30

## 2017-08-22 HISTORY — DX: Pneumonia, unspecified organism: J18.9

## 2017-08-22 HISTORY — DX: Disorder of pituitary gland, unspecified: E23.7

## 2017-08-22 LAB — CBC
HEMATOCRIT: 45.3 % (ref 39.0–52.0)
Hemoglobin: 15.7 g/dL (ref 13.0–17.0)
MCH: 30.8 pg (ref 26.0–34.0)
MCHC: 34.7 g/dL (ref 30.0–36.0)
MCV: 88.8 fL (ref 78.0–100.0)
Platelets: 259 10*3/uL (ref 150–400)
RBC: 5.1 MIL/uL (ref 4.22–5.81)
RDW: 14.6 % (ref 11.5–15.5)
WBC: 8.8 10*3/uL (ref 4.0–10.5)

## 2017-08-22 LAB — BASIC METABOLIC PANEL
ANION GAP: 6 (ref 5–15)
BUN: 10 mg/dL (ref 6–20)
CALCIUM: 9.2 mg/dL (ref 8.9–10.3)
CO2: 24 mmol/L (ref 22–32)
Chloride: 109 mmol/L (ref 101–111)
Creatinine, Ser: 0.68 mg/dL (ref 0.61–1.24)
GLUCOSE: 114 mg/dL — AB (ref 65–99)
POTASSIUM: 4 mmol/L (ref 3.5–5.1)
Sodium: 139 mmol/L (ref 135–145)

## 2017-08-22 LAB — GLUCOSE, CAPILLARY: Glucose-Capillary: 118 mg/dL — ABNORMAL HIGH (ref 65–99)

## 2017-08-22 NOTE — Pre-Procedure Instructions (Signed)
BMP results 08/22/17 faxed to Dr. Tonita Cong via epic.

## 2017-08-23 ENCOUNTER — Ambulatory Visit: Payer: BC Managed Care – PPO | Admitting: *Deleted

## 2017-08-23 DIAGNOSIS — R293 Abnormal posture: Secondary | ICD-10-CM

## 2017-08-23 DIAGNOSIS — M545 Low back pain: Secondary | ICD-10-CM | POA: Diagnosis not present

## 2017-08-23 DIAGNOSIS — G8929 Other chronic pain: Secondary | ICD-10-CM

## 2017-08-23 NOTE — Therapy (Signed)
[facility] [place], [place], [address] Phone: [phone]   Fax:  [phone]  Physical Therapy Treatment  Patient Details  Name: Roger Guerra MRN: [id number] Date of Birth: 1956/07/29 Referring Provider: Susa Day MD   Encounter Date: 08/23/2017  PT End of Session - 08/23/17 1705    Visit Number  19    Number of Visits  24    Date for PT Re-Evaluation  08/30/17    PT Start Time  5885    PT Stop Time  0277    PT Time Calculation (min)  50 min       Past Medical History:  Diagnosis Date  . Anxiety disorder   . Depression   . Erectile dysfunction   . GERD (gastroesophageal reflux disease)    history of  . History of DVT of lower extremity    Right leg 2002   . Hyperlipidemia   . Nerve root and plexus disorder   . Obesity   . Obstructive sleep apnea   . Pituitary disorder ([place])   . Pneumonia    history of  . Sciatica   . Sleep apnea    On CPAP   . Spondylosis   . Type 2 diabetes mellitus (HCC)    diet controlled    Past Surgical History:  Procedure Laterality Date  . BLADDER SURGERY  2015   removal of skin tag near urethra  . COLONOSCOPY N/A 12/10/2016   Procedure: COLONOSCOPY;  Surgeon: Daneil Dolin, MD;  [facility];  Service: Endoscopy;  Laterality: N/A;  8:30 AM  . POLYPECTOMY  12/10/2016   Procedure: POLYPECTOMY;  Surgeon: Daneil Dolin, MD;  [facility];  Service: Endoscopy;;  hepatic flexure  . TONSILLECTOMY      There were no vitals filed for this visit.  Subjective Assessment - 08/23/17 1700    Subjective  Did good after last Rx.. LB pain 3/10    Pertinent History  See MRI report.    Limitations  Standing    How long can you stand comfortably?  10 minutes.    Patient Stated Goals  Get out of pain.  "Be able to pick up small items from the floor.    Currently in Pain?  Yes    Pain Score  3     Pain Location  Back    Pain Orientation  Left    Pain  Descriptors / Indicators  Discomfort    Pain Onset  More than a month ago                      [facility] Adult PT Treatment/Exercise - 08/23/17 0001      Lumbar Exercises: Aerobic   Stationary Bike  NuStep L6 x  28mn      Lumbar Exercises: Machines for Strengthening   Cybex Lumbar Extension  70# x20 reps      Lumbar Exercises: Standing   Row  Strengthening;Both;20 reps XTS pink 4x10    Shoulder Extension  Strengthening;Both;20 reps XTS pink lat pulldown 3x10      Lumbar Exercises: Seated   Long Arc Quad on Chair  Left;2 sets 5# x30 pause at top      Knee/Hip Exercises: Stretches   Piriformis Stretch  Left;30 seconds;5 reps figure 4      Modalities   Modalities  Electrical Stimulation;Moist Heat      Moist Heat Therapy   Number Minutes Moist Heat  15 Minutes    Moist Heat Location  Lumbar Spine;Hip      Electrical Stimulation   Electrical Stimulation Location  l lumbar and gluteals  IFC x15 mins 80-'150hz'     Electrical Stimulation Goals  Pain                  PT Long Term Goals - 08/11/17 1730      PT LONG TERM GOAL #2   Title  Stand 20 minutes with pain not > 2-3/10.    Baseline  5/10 with standing beyond 5 min    Time  4    Period  Weeks    Status  Achieved            Plan - 08/23/17 1742    Clinical Impression Statement  Pt arrived today doing fairly well with minimal LBP 3/10. He feels Rxs are helping and ADLs  are less painful. He is having RT knee surgery this Thursday and will resume PT next week. No new LTGs met today due to pain.. All LTGs met except eliminating LE symptoms.    Rehab Potential  Good    PT Frequency  3x / week    PT Duration  4 weeks    PT Treatment/Interventions  ADLs/Self Care Home Management;Cryotherapy;Electrical Stimulation;Moist Heat;Traction;Ultrasound;Therapeutic activities;Therapeutic exercise;Manual techniques;Patient/family education;Dry needling    PT Next Visit Plan  Assess DN, continue with  flexibility, strenghening and stability to meet remaining LTGs.    Consulted and Agree with Plan of Care  Patient       Patient will benefit from skilled therapeutic intervention in order to improve the following deficits and impairments:  Decreased activity tolerance, Postural dysfunction, Pain, Decreased strength, Decreased range of motion  Visit Diagnosis: Chronic midline low back pain, with sciatica presence unspecified  Chronic left-sided low back pain, with sciatica presence unspecified  Abnormal posture     Problem List Patient Active Problem List   Diagnosis Date Noted  . Male hypogonadism 07/24/2015  . Uncontrolled type 2 diabetes mellitus without complication, without long-term current use of insulin ([place]) 07/24/2015  . Hyperlipidemia 07/24/2015    RAMSEUR,CHRIS, PTA 08/23/2017, 5:56 PM  [facility] [address] [place], [place], [address] Phone: [phone]   Fax:  [phone]  Name: Roger Guerra MRN: [id number] Date of Birth: [birth year]

## 2017-08-24 NOTE — Anesthesia Preprocedure Evaluation (Signed)
Anesthesia Evaluation  Patient identified by MRN, date of birth, ID band Patient awake    Reviewed: Allergy & Precautions, H&P , Patient's Chart, lab work & pertinent test results, reviewed documented beta blocker date and time   Airway Mallampati: II  TM Distance: >3 FB Neck ROM: full    Dental no notable dental hx.    Pulmonary sleep apnea , former smoker,    Pulmonary exam normal breath sounds clear to auscultation       Cardiovascular  Rhythm:regular Rate:Normal     Neuro/Psych    GI/Hepatic GERD  ,  Endo/Other  diabetes, Type 2  Renal/GU      Musculoskeletal   Abdominal   Peds  Hematology   Anesthesia Other Findings   Reproductive/Obstetrics                             Anesthesia Physical Anesthesia Plan  ASA: II  Anesthesia Plan: General   Post-op Pain Management:    Induction: Intravenous  PONV Risk Score and Plan: 2 and Ondansetron, Dexamethasone and Treatment may vary due to age or medical condition  Airway Management Planned: LMA  Additional Equipment:   Intra-op Plan:   Post-operative Plan:   Informed Consent: I have reviewed the patients History and Physical, chart, labs and discussed the procedure including the risks, benefits and alternatives for the proposed anesthesia with the patient or authorized representative who has indicated his/her understanding and acceptance.   Dental Advisory Given  Plan Discussed with: CRNA and Surgeon  Anesthesia Plan Comments: ( )        Anesthesia Quick Evaluation

## 2017-08-25 ENCOUNTER — Ambulatory Visit (HOSPITAL_COMMUNITY): Payer: BC Managed Care – PPO | Admitting: Anesthesiology

## 2017-08-25 ENCOUNTER — Other Ambulatory Visit: Payer: Self-pay

## 2017-08-25 ENCOUNTER — Ambulatory Visit (HOSPITAL_COMMUNITY)
Admission: RE | Admit: 2017-08-25 | Discharge: 2017-08-25 | Disposition: A | Discharge status: Home / Self Care | Payer: BC Managed Care – PPO | Source: Ambulatory Visit | Attending: Specialist | Admitting: Specialist

## 2017-08-25 ENCOUNTER — Encounter (HOSPITAL_COMMUNITY)
Admission: RE | Disposition: A | Discharge status: Home / Self Care | Payer: Self-pay | Source: Ambulatory Visit | Attending: Specialist

## 2017-08-25 ENCOUNTER — Encounter (HOSPITAL_COMMUNITY): Payer: Self-pay | Admitting: Emergency Medicine

## 2017-08-25 DIAGNOSIS — M23203 Derangement of unspecified medial meniscus due to old tear or injury, right knee: Secondary | ICD-10-CM | POA: Insufficient documentation

## 2017-08-25 DIAGNOSIS — Z86718 Personal history of other venous thrombosis and embolism: Secondary | ICD-10-CM | POA: Insufficient documentation

## 2017-08-25 DIAGNOSIS — E119 Type 2 diabetes mellitus without complications: Secondary | ICD-10-CM | POA: Insufficient documentation

## 2017-08-25 DIAGNOSIS — G4733 Obstructive sleep apnea (adult) (pediatric): Secondary | ICD-10-CM | POA: Insufficient documentation

## 2017-08-25 DIAGNOSIS — Z79899 Other long term (current) drug therapy: Secondary | ICD-10-CM | POA: Insufficient documentation

## 2017-08-25 DIAGNOSIS — E669 Obesity, unspecified: Secondary | ICD-10-CM | POA: Insufficient documentation

## 2017-08-25 DIAGNOSIS — Z87891 Personal history of nicotine dependence: Secondary | ICD-10-CM | POA: Insufficient documentation

## 2017-08-25 DIAGNOSIS — F419 Anxiety disorder, unspecified: Secondary | ICD-10-CM | POA: Insufficient documentation

## 2017-08-25 DIAGNOSIS — S83241A Other tear of medial meniscus, current injury, right knee, initial encounter: Secondary | ICD-10-CM

## 2017-08-25 DIAGNOSIS — S83104A Unspecified dislocation of right knee, initial encounter: Secondary | ICD-10-CM

## 2017-08-25 DIAGNOSIS — Z7982 Long term (current) use of aspirin: Secondary | ICD-10-CM | POA: Insufficient documentation

## 2017-08-25 DIAGNOSIS — M2241 Chondromalacia patellae, right knee: Secondary | ICD-10-CM | POA: Insufficient documentation

## 2017-08-25 DIAGNOSIS — K219 Gastro-esophageal reflux disease without esophagitis: Secondary | ICD-10-CM | POA: Insufficient documentation

## 2017-08-25 DIAGNOSIS — F329 Major depressive disorder, single episode, unspecified: Secondary | ICD-10-CM | POA: Insufficient documentation

## 2017-08-25 DIAGNOSIS — E785 Hyperlipidemia, unspecified: Secondary | ICD-10-CM | POA: Insufficient documentation

## 2017-08-25 HISTORY — PX: KNEE ARTHROSCOPY WITH MEDIAL MENISECTOMY: SHX5651

## 2017-08-25 LAB — GLUCOSE, CAPILLARY
Glucose-Capillary: 136 mg/dL — ABNORMAL HIGH (ref 65–99)
Glucose-Capillary: 115 mg/dL — ABNORMAL HIGH (ref 65–99)

## 2017-08-25 SURGERY — ARTHROSCOPY, KNEE, WITH MEDIAL MENISCECTOMY
Anesthesia: General | Laterality: Right

## 2017-08-25 MED ORDER — OXYCODONE-ACETAMINOPHEN 5-325 MG PO TABS: 1.0000 | ORAL_TABLET | ORAL | 0 refills | Status: DC | PRN

## 2017-08-25 MED ORDER — PROPOFOL 10 MG/ML IV BOLUS
INTRAVENOUS | Status: AC
  Filled 2017-08-25: qty 60

## 2017-08-25 MED ORDER — METHOCARBAMOL 1000 MG/10ML IJ SOLN
500.0000 mg | Freq: Once | INTRAVENOUS | Status: AC
  Administered 2017-08-25: 500 mg via INTRAVENOUS
  Filled 2017-08-25: qty 550

## 2017-08-25 MED ORDER — MIDAZOLAM HCL 2 MG/2ML IJ SOLN
INTRAMUSCULAR | Status: AC
  Filled 2017-08-25: qty 2

## 2017-08-25 MED ORDER — KETOROLAC TROMETHAMINE 30 MG/ML IJ SOLN
INTRAMUSCULAR | Status: DC | PRN
  Administered 2017-08-25: 30 mg via INTRAVENOUS

## 2017-08-25 MED ORDER — DEXAMETHASONE SODIUM PHOSPHATE 10 MG/ML IJ SOLN
INTRAMUSCULAR | Status: DC | PRN
  Administered 2017-08-25: 5 mg via INTRAVENOUS

## 2017-08-25 MED ORDER — EPINEPHRINE PF 1 MG/ML IJ SOLN
INTRAMUSCULAR | Status: AC
  Filled 2017-08-25: qty 2

## 2017-08-25 MED ORDER — BUPIVACAINE-EPINEPHRINE (PF) 0.5% -1:200000 IJ SOLN
INTRAMUSCULAR | Status: AC
  Filled 2017-08-25: qty 30

## 2017-08-25 MED ORDER — FENTANYL CITRATE (PF) 100 MCG/2ML IJ SOLN
INTRAMUSCULAR | Status: DC | PRN
  Administered 2017-08-25 (×2): 50 ug via INTRAVENOUS

## 2017-08-25 MED ORDER — LACTATED RINGERS IV SOLN
INTRAVENOUS | Status: DC
  Administered 2017-08-25 (×2): via INTRAVENOUS

## 2017-08-25 MED ORDER — KETOROLAC TROMETHAMINE 30 MG/ML IJ SOLN
INTRAMUSCULAR | Status: AC
  Filled 2017-08-25: qty 1

## 2017-08-25 MED ORDER — LIDOCAINE 2% (20 MG/ML) 5 ML SYRINGE
INTRAMUSCULAR | Status: AC
  Filled 2017-08-25: qty 5

## 2017-08-25 MED ORDER — EPINEPHRINE PF 1 MG/ML IJ SOLN
INTRAMUSCULAR | Status: DC | PRN
  Administered 2017-08-25: 2 mg

## 2017-08-25 MED ORDER — DEXAMETHASONE SODIUM PHOSPHATE 10 MG/ML IJ SOLN
INTRAMUSCULAR | Status: AC
  Filled 2017-08-25: qty 1

## 2017-08-25 MED ORDER — OXYCODONE-ACETAMINOPHEN 5-325 MG PO TABS
1.0000 | ORAL_TABLET | ORAL | Status: DC | PRN
  Administered 2017-08-25: 1 via ORAL
  Filled 2017-08-25: qty 1

## 2017-08-25 MED ORDER — FENTANYL CITRATE (PF) 100 MCG/2ML IJ SOLN: 25.0000 ug | INTRAMUSCULAR | Status: DC | PRN

## 2017-08-25 MED ORDER — FENTANYL CITRATE (PF) 100 MCG/2ML IJ SOLN
INTRAMUSCULAR | Status: AC
  Filled 2017-08-25: qty 2

## 2017-08-25 MED ORDER — CHLORHEXIDINE GLUCONATE 4 % EX LIQD: 60.0000 mL | Freq: Once | CUTANEOUS | Status: DC

## 2017-08-25 MED ORDER — ACETAMINOPHEN 10 MG/ML IV SOLN
1000.0000 mg | INTRAVENOUS | Status: AC
  Administered 2017-08-25: 1000 mg via INTRAVENOUS

## 2017-08-25 MED ORDER — FENTANYL CITRATE (PF) 250 MCG/5ML IJ SOLN
INTRAMUSCULAR | Status: AC
  Filled 2017-08-25: qty 5

## 2017-08-25 MED ORDER — CEFAZOLIN SODIUM-DEXTROSE 2-4 GM/100ML-% IV SOLN
INTRAVENOUS | Status: AC
  Filled 2017-08-25: qty 100

## 2017-08-25 MED ORDER — ONDANSETRON HCL 4 MG/2ML IJ SOLN
INTRAMUSCULAR | Status: AC
  Filled 2017-08-25: qty 2

## 2017-08-25 MED ORDER — LACTATED RINGERS IR SOLN
Status: DC | PRN
Start: 2017-08-25 — End: 2017-08-25
  Administered 2017-08-25: 6000 mL

## 2017-08-25 MED ORDER — ACETAMINOPHEN 10 MG/ML IV SOLN
INTRAVENOUS | Status: AC
  Filled 2017-08-25: qty 100

## 2017-08-25 MED ORDER — PROPOFOL 500 MG/50ML IV EMUL
INTRAVENOUS | Status: DC | PRN
  Administered 2017-08-25: 200 ug/kg/min via INTRAVENOUS

## 2017-08-25 MED ORDER — LIDOCAINE HCL (CARDIAC) 20 MG/ML IV SOLN
INTRAVENOUS | Status: DC | PRN
  Administered 2017-08-25: 100 mg via INTRAVENOUS

## 2017-08-25 MED ORDER — BUPIVACAINE-EPINEPHRINE 0.5% -1:200000 IJ SOLN
INTRAMUSCULAR | Status: DC | PRN
  Administered 2017-08-25: 20 mL

## 2017-08-25 MED ORDER — ONDANSETRON HCL 4 MG/2ML IJ SOLN
INTRAMUSCULAR | Status: DC | PRN
  Administered 2017-08-25: 4 mg via INTRAVENOUS

## 2017-08-25 MED ORDER — CEFAZOLIN SODIUM-DEXTROSE 2-4 GM/100ML-% IV SOLN
2.0000 g | INTRAVENOUS | Status: AC
  Administered 2017-08-25: 2 g via INTRAVENOUS

## 2017-08-25 MED ORDER — MIDAZOLAM HCL 5 MG/5ML IJ SOLN
INTRAMUSCULAR | Status: DC | PRN
  Administered 2017-08-25: 2 mg via INTRAVENOUS

## 2017-08-25 SURGICAL SUPPLY — 25 items
BANDAGE ACE 6X5 VEL STRL LF (GAUZE/BANDAGES/DRESSINGS) ×3 IMPLANT
BLADE 4.2CUDA (BLADE) IMPLANT
BLADE CUDA SHAVER 3.5 (BLADE) ×3 IMPLANT
BLADE SURG SZ11 CARB STEEL (BLADE) IMPLANT
BOOTIES KNEE HIGH SLOAN (MISCELLANEOUS) ×6 IMPLANT
CLOTH 2% CHLOROHEXIDINE 3PK (PERSONAL CARE ITEMS) ×3 IMPLANT
COVER SURGICAL LIGHT HANDLE (MISCELLANEOUS) ×3 IMPLANT
DRSG EMULSION OIL 3X3 NADH (GAUZE/BANDAGES/DRESSINGS) ×3 IMPLANT
DRSG PAD ABDOMINAL 8X10 ST (GAUZE/BANDAGES/DRESSINGS) ×3 IMPLANT
DURAPREP 26ML APPLICATOR (WOUND CARE) ×3 IMPLANT
GLOVE BIOGEL PI IND STRL 7.0 (GLOVE) IMPLANT
GLOVE BIOGEL PI INDICATOR 7.0 (GLOVE)
GLOVE SURG SS PI 7.0 STRL IVOR (GLOVE) ×3 IMPLANT
GLOVE SURG SS PI 7.5 STRL IVOR (GLOVE) IMPLANT
GLOVE SURG SS PI 8.0 STRL IVOR (GLOVE) ×6 IMPLANT
GOWN STRL REUS W/TWL XL LVL3 (GOWN DISPOSABLE) ×3 IMPLANT
KIT BASIN OR (CUSTOM PROCEDURE TRAY) IMPLANT
MANIFOLD NEPTUNE II (INSTRUMENTS) ×3 IMPLANT
PACK ARTHROSCOPY WL (CUSTOM PROCEDURE TRAY) ×3 IMPLANT
PAD ABD 8X10 STRL (GAUZE/BANDAGES/DRESSINGS) ×3 IMPLANT
PADDING CAST COTTON 6X4 STRL (CAST SUPPLIES) ×3 IMPLANT
SUT ETHILON 4 0 PS 2 18 (SUTURE) ×3 IMPLANT
TUBING ARTHRO INFLOW-ONLY STRL (TUBING) ×3 IMPLANT
WAND HAND CNTRL MULTIVAC 50 (MISCELLANEOUS) ×3 IMPLANT
WRAP KNEE MAXI GEL POST OP (GAUZE/BANDAGES/DRESSINGS) ×3 IMPLANT

## 2017-08-25 NOTE — H&P (Signed)
Roger Guerra is an 61 y.o. male.   Chief Complaint: Right knee pain  HPI: Knee pain and giving way refractory  Past Medical History:  Diagnosis Date  . Anxiety disorder   . Depression   . Erectile dysfunction   . GERD (gastroesophageal reflux disease)    history of  . History of DVT of lower extremity    Right leg 2002   . Hyperlipidemia   . Nerve root and plexus disorder   . Obesity   . Obstructive sleep apnea   . Pituitary disorder ([place])   . Pneumonia    history of  . Sciatica   . Sleep apnea    On CPAP   . Spondylosis   . Type 2 diabetes mellitus (HCC)    diet controlled    Past Surgical History:  Procedure Laterality Date  . BLADDER SURGERY  2015   removal of skin tag near urethra  . COLONOSCOPY N/A 12/10/2016   Procedure: COLONOSCOPY;  Surgeon: Daneil Dolin, MD;  [facility];  Service: Endoscopy;  Laterality: N/A;  8:30 AM  . POLYPECTOMY  12/10/2016   Procedure: POLYPECTOMY;  Surgeon: Daneil Dolin, MD;  [facility];  Service: Endoscopy;;  hepatic flexure  . TONSILLECTOMY      Family History  Problem Relation Age of Onset  . Lung cancer Father   . Heart disease Father   . Diabetes Mother   . Diabetes Other        Siblings  . Diabetes Sister    Social History:  reports that he has quit smoking. His smoking use included cigarettes. he has never used smokeless tobacco. He reports that he does not drink alcohol or use drugs.  Allergies:  Allergies  Allergen Reactions  . Invokana [Canagliflozin] Other (See Comments)    Yeast infection    Medications Prior to Admission  Medication Sig Dispense Refill  . aspirin 81 MG tablet Take 81 mg by mouth daily.    . carisoprodol (SOMA) 350 MG tablet Take 350 mg 3 (three) times daily as needed by mouth for muscle spasms.    . cetirizine (ZYRTEC) 10 MG tablet Take 10 mg by mouth every evening.     Marland Kitchen co-enzyme Q-10 30 MG capsule Take 30 mg by mouth daily.    . fexofenadine (ALLEGRA) 180 MG  tablet Take 180 mg by mouth every morning.     . Multiple Vitamin (MULTIVITAMIN WITH MINERALS) TABS tablet Take 1 tablet by mouth daily.    . Omega-3 Fatty Acids (FISH OIL PO) Take 1 capsule by mouth daily.    Marland Kitchen omeprazole (PRILOSEC OTC) 20 MG tablet Take 20 mg by mouth daily as needed (acid reflux).    Marland Kitchen PRESCRIPTION MEDICATION Inject 1 Dose as directed as needed (erectile dysfunction). Trimix Papaverine 30mg /ml Phentolamine 1mg /ml Prostaglandin 66mcg/ml    . sodium chloride (OCEAN) 0.65 % SOLN nasal spray Place 1 spray into both nostrils as needed for congestion.    Marland Kitchen testosterone cypionate (DEPOTESTOSTERONE CYPIONATE) 200 MG/ML injection Inject 200 mg into the muscle every 14 (fourteen) days.    . vitamin E 400 UNIT capsule Take 400 Units by mouth daily.      Results for orders placed or performed during the hospital encounter of 08/25/17 (from the past 48 hour(s))  Glucose, capillary     Status: Abnormal   Collection Time: 08/25/17  5:29 AM  Result Value Ref Range   Glucose-Capillary 136 (H) 65 - 99 mg/dL  Comment 1 Notify RN    No results found.  Review of Systems  Musculoskeletal: Positive for joint pain.  All other systems reviewed and are negative.   Blood pressure 135/87, pulse 84, temperature 98 F (36.7 C), temperature source Oral, resp. rate 18, height 5\' 11"  (1.803 m), weight 109.8 kg (242 lb), SpO2 100 %. Physical Exam  Constitutional: He is oriented to person, place, and time. He appears well-developed.  HENT:  Head: Normocephalic.  Eyes: Pupils are equal, round, and reactive to light.  Neck: Normal range of motion.  Cardiovascular: Normal rate.  Respiratory: Effort normal.  GI: Soft.  Musculoskeletal:  Right knee effusion. Tender medial joint line. Positive Mcmurray. Range 0-120- No DVT  NVI  Neurological: He is alert and oriented to person, place, and time.  Skin: Skin is warm and dry.  Psychiatric: He has a normal mood and affect.  MRI: displaced meniscus  tear and full thickness OA MFC.  Assessment/Plan Right knee pain and giving wasy due to medial meniscus tear displaced and OA. Plan right knee arthroscopy and partial medial menisectomy. Risks and benefits discussed.  Johnn Hai, MD 08/25/2017, 7:29 AM

## 2017-08-25 NOTE — Anesthesia Procedure Notes (Signed)
Procedure Name: LMA Insertion Date/Time: 08/25/2017 7:44 AM Performed by: Lissa Morales, CRNA Pre-anesthesia Checklist: Patient identified, Emergency Drugs available, Suction available and Patient being monitored Patient Re-evaluated:Patient Re-evaluated prior to induction Oxygen Delivery Method: Circle system utilized Preoxygenation: Pre-oxygenation with 100% oxygen Induction Type: IV induction LMA: LMA with gastric port inserted LMA Size: 5.0 Number of attempts: 1 Airway Equipment and Method: Oral airway Placement Confirmation: positive ETCO2 Tube secured with: Tape Dental Injury: Teeth and Oropharynx as per pre-operative assessment

## 2017-08-25 NOTE — Brief Op Note (Signed)
08/25/2017  8:24 AM  PATIENT:  [name]  61 y.o. male  PRE-OPERATIVE DIAGNOSIS:  Degenerative joint disease, medial meniscal tear right knee  POST-OPERATIVE DIAGNOSIS:  Degenerative joint disease, medial meniscal tear right knee  PROCEDURE:  Procedure(s) with comments: Right knee arthroscopy, partial medial menisectomy and debridement (Right) - 60 mins  SURGEON:  Surgeon(s) and Role:    Susa Day, MD - Primary  PHYSICIAN ASSISTANT:   ASSISTANTS: [place]   ANESTHESIA:   general  EBL:   none   BLOOD ADMINISTERED:none  DRAINS: none   LOCAL MEDICATIONS USED:  MARCAINE     SPECIMEN:  No Specimen  DISPOSITION OF SPECIMEN:  N/A  COUNTS:  YES  TOURNIQUET:  * No tourniquets in log *  DICTATION: .Other Dictation: Dictation Number [id number]  PLAN OF CARE: Discharge to home after PACU  PATIENT DISPOSITION:  PACU - hemodynamically stable.   Delay start of Pharmacological VTE agent (>24hrs) due to surgical blood loss or risk of bleeding: no

## 2017-08-25 NOTE — Discharge Instructions (Signed)
General Anesthesia, Adult, Care After These instructions provide you with information about caring for yourself after your procedure. Your health care provider may also give you more specific instructions. Your treatment has been planned according to current medical practices, but problems sometimes occur. Call your health care provider if you have any problems or questions after your procedure. What can I expect after the procedure? After the procedure, it is common to have:  Vomiting.  A sore throat.  Mental slowness.   It is common to feel:  Nauseous.  Cold or shivery.  Sleepy.  Tired.  Sore or achy, even in parts of your body where you did not have surgery.  Follow these instructions at home: For at least 24 hours after the procedure:  Do not: ? Participate in activities where you could fall or become injured. ? Drive. ? Use heavy machinery. ? Drink alcohol. ? Take sleeping pills or medicines that cause drowsiness. ? Make important decisions or sign legal documents. ? Take care of children on your own.  Rest. Eating and drinking  If you vomit, drink water, juice, or soup when you can drink without vomiting.  Drink enough fluid to keep your urine clear or pale yellow.  Make sure you have little or no nausea before eating solid foods.  Follow the diet recommended by your health care provider. General instructions  Have a responsible adult stay with you until you are awake and alert.  Return to your normal activities as told by your health care provider. Ask your health care provider what activities are safe for you.  Take over-the-counter and prescription medicines only as told by your health care provider.  If you smoke, do not smoke without supervision.  Keep all follow-up visits as told by your health care provider. This is important. Contact a health care provider if:  You continue to have nausea or vomiting at home, and medicines are not  helpful.  You cannot drink fluids or start eating again.  You cannot urinate after 8-12 hours.  You develop a skin rash.  You have fever.  You have increasing redness at the site of your procedure. Get help right away if:  You have difficulty breathing.  You have chest pain.  You have unexpected bleeding.  You feel that you are having a life-threatening or urgent problem. This information is not intended to replace advice given to you by your health care provider. Make sure you discuss any questions you have with your health care provider. Document Released: 12/20/2000 Document Revised: 02/16/2016 Document Reviewed: 08/28/2015 Elsevier Interactive Patient Education  2018 [place] CARE INSTRUCTIONS   PAIN You will be expected to have a moderate amount of pain in the affected knee for approximately two weeks.  However, the first two to four days will be the most severe in terms of the pain you will experience.  Prescriptions have been provided for you to take as needed for the pain.  The pain can be markedly reduced by using the ice/compressive bandage given.  Exchange the ice packs whenever they thaw.  During the night, keep the bandage on because it will still provide some compression for the swelling.  Also, keep the leg elevated on pillows above your heart, and this will help alleviate the pain and swelling.  MEDICATION Prescriptions have been provided to take as needed for pain. To prevent blood clots, take Aspirin 325mg  daily with a meal if not on a blood thinner  and if no history of stomach ulcers.  ACTIVITY It is preferred that you stay on bedrest for approximately 24 hours.  However, you may go to the bathroom with help.  After this, you can start to be up and about progressively more.  Remember that the swelling may still increase after three to four days if you are up and doing too much.  You may put as much weight on the affected leg  as pain will allow.  Use your crutches for comfort and safety.  However, as soon as you are able, you may discard the crutches and go without them.   DRESSING Keep the current dressing as dry as possible.  Two days after your surgery, you may remove the ice/compressive wrap, and surgical dressing.  You may now take a shower, but do not scrub the sounds directly with soap.  Let water rinse over these and gently wipe with your hand.  Reapply band-aids over the puncture wounds and more gauze if needed.  A slight amount of thin drainage can be normal at this time, and do not let it frighten you.  Reapply the ice/compressive wrap.  You may now repeat this every day each time you shower.  SYMPTOMS TO REPORT TO YOUR DOCTOR  -Extreme pain.  -Extreme swelling.  -Temperature above 101 degrees that does not come down with acetaminophen     (Tylenol).  -Any changes in the feeling, color or movement of your toes.  -Extreme redness, heat, swelling or drainage at your incision  EXERCISE It is preferred that you begin to exercise on the day of your surgery.  Straight leg raises and short arc quads should be begun the afternoon or evening of surgery and continued until you come back for your follow-up appointment.   Attached is an instruction sheet on how to perform these two simple exercises.  Do these at least three times per day if not more.  You may bend your knee as much as is comfortable.  The puncture wounds may occasionally be slightly uncomfortable with bending of the knee.  Do not let this frighten you.  It is important to keep your knee motion, but do not overdo it.  If you have significant pain, simply do not bend the knee as far.   You will be given more exercises to perform at your first return visit.  Take 325mg  aspirin per day  RETURN APPOINTMENT Please make an appointment to be seen by your doctor in 14 days from your surgery.  Patient Signature:   ________________________________________________________  Nurse's Signature:  ________________________________________________________

## 2017-08-25 NOTE — Transfer of Care (Signed)
Immediate Anesthesia Transfer of Care Note  Patient: Roger Guerra  Procedure(s) Performed: Right knee arthroscopy, partial medial menisectomy and debridement (Right )  Patient Location: PACU  Anesthesia Type:General  Level of Consciousness: awake, alert , oriented and patient cooperative  Airway & Oxygen Therapy: Patient Spontanous Breathing and Patient connected to face mask oxygen  Post-op Assessment: Report given to RN, Post -op Vital signs reviewed and stable and Patient moving all extremities X 4  Post vital signs: stable  Last Vitals:  Vitals:   08/25/17 0508  BP: 135/87  Pulse: 84  Resp: 18  Temp: 36.7 C  SpO2: 100%    Last Pain:  Vitals:   08/25/17 0525  TempSrc:   PainSc: 2       Patients Stated Pain Goal: 4 (18/33/58 2518)  Complications: No apparent anesthesia complications

## 2017-08-26 NOTE — Op Note (Signed)
NAME:  Roger Guerra, Roger Guerra NO.:  [id number]  MEDICAL RECORD NO.:  [id number]  LOCATION:  [facility]                         FACILITY:  [facility]  PHYSICIAN:  Susa Day, M.D.    DATE OF BIRTH:  [birth year]  DATE OF PROCEDURE:  08/25/2017 DATE OF DISCHARGE:                              OPERATIVE REPORT   PREOPERATIVE DIAGNOSIS:  Medial meniscus tear, chondromalacia of the right knee.  POSTOPERATIVE DIAGNOSES: 1. Complex tear of the medial meniscus. 2. Extensive grade 3 and grade 4 lesions of the medial femoral condyle     and medial tibial plateau. 3. Grade 3 chondromalacia of the patellofemoral joint.  PROCEDURES PERFORMED: 1. Right knee arthroscopy. 2. Partial medial meniscectomy. 3. Chondroplasty of medial femoral condyle, medial tibial plateau and     the patellofemoral joint.  HISTORY:  A 61 year old with mechanical symptoms of locking, giving way, temporary relief from injection.  MRI indicating meniscus tear, complex displaced as well as some extensive grade 3 and possible grade 4 changes of the femoral condyle and tibial plateau.  He was indicated for knee arthroscopy, partial meniscectomy to address the portion of the symptoms.  His symptoms at that point did not warrant total knee replacement.  We discussed resecting the meniscus, debridement and the risks and benefits including bleeding, infection, damage to the neurovascular structures, no changes in symptoms, worsening of symptoms, DVT, PE, anesthetic complications, etc.  TECHNIQUE:  With the patient in supine position after the induction of adequate general anesthesia, 2 g of Kefzol, the right lower extremity was prepped and draped in usual sterile fashion.  A lateral parapatellar portal was fashioned with a #11 blade.  Ingress cannula atraumatically placed.  Irrigant was utilized to insufflate the joint.  Under direct visualization, a medial parapatellar portal was fashioned with a #11 blade after  localization with an 18-gauge needle sparing the medial meniscus.  Noted, medially, there was extensive grade 3 and some minor grade 4 changes of the tibial plateau and femoral condyle between the junction of the anterior and posterior half of the medial meniscus.  The flap tear as described by the MRI was noted, complex cleavage into the joint.  This was resected with a basket, further contoured with an ArthroWand and a 3.5 shaver.  The remainder of the meniscus was torn radially as well and resected approximately 20% of the anterior half into the posterior half of the meniscus.  Further contoured with an ArthroWand.  The remnant was stable to probe palpation.  Light chondroplasty performed of femoral condyle and tibial plateau.  We felt microfracture was not indicated due to the extent of the grade 4 changes.  We then decreased the arthroscopic pressure to 0, cauterized any bleeding.  This tear extended back to the meniscocapsular junction. So, the tear foot plate was contoured and it was stable to probe palpation and with flexion-extension, it did not get caught within the tibiofemoral joint.  ACL was unremarkable.  Lateral compartment with minor radial fraying of the meniscus.  Minimal grade 3 changes with lavage of the lateral compartment.  Suprapatellar pouch, grade 3 changes of the patella in the femoral sulcus.  Light chondroplasty performed here, wraps in the central  part of the patella, was near grade 4.  Normal patellofemoral tracking. Gutters were unremarkable.  Revisited all compartments, no further pathology amenable to arthroscopic intervention.  I therefore removed all instrumentation. Portals were closed with 4-0 nylon simple sutures.  A 0.25% Marcaine with epinephrine was infiltrated.  The joint was dressed sterilely, awoken without difficulty and transported to the recovery room in satisfactory condition.  The patient tolerated the procedure well.  No  complications.  Assistant, Lacie Draft, [place], was used to figure for the knee, extend the knee and opened up the medial compartment to gain access to this complex meniscus.     Susa Day, M.D.     Geralynn Rile  D:  08/25/2017  T:  08/26/2017  Job:  [id number]

## 2017-08-26 NOTE — Op Note (Deleted)
  The note originally documented on this encounter has been moved the the encounter in which it belongs.  

## 2017-08-28 NOTE — Anesthesia Postprocedure Evaluation (Signed)
Anesthesia Post Note  Patient: Roger Guerra  Procedure(s) Performed: Right knee arthroscopy, partial medial menisectomy and debridement (Right )     Patient location during evaluation: PACU Anesthesia Type: General Level of consciousness: awake and alert Pain management: pain level controlled Vital Signs Assessment: post-procedure vital signs reviewed and stable Respiratory status: spontaneous breathing, nonlabored ventilation, respiratory function stable and patient connected to nasal cannula oxygen Cardiovascular status: blood pressure returned to baseline and stable Postop Assessment: no apparent nausea or vomiting Anesthetic complications: no    Last Vitals:  Vitals:   08/25/17 0945 08/25/17 1038  BP: 131/76 138/70  Pulse: 73 75  Resp: 16 16  Temp: 36.5 C 36.7 C  SpO2: 94% 96%    Last Pain:  Vitals:   08/25/17 1038  TempSrc: Oral  PainSc:                  Riccardo Dubin

## 2017-09-13 ENCOUNTER — Encounter: Payer: Self-pay | Admitting: Physical Therapy

## 2017-09-13 ENCOUNTER — Ambulatory Visit: Payer: BC Managed Care – PPO | Attending: Specialist | Admitting: Physical Therapy

## 2017-09-13 DIAGNOSIS — G8929 Other chronic pain: Secondary | ICD-10-CM | POA: Diagnosis present

## 2017-09-13 DIAGNOSIS — M25561 Pain in right knee: Secondary | ICD-10-CM | POA: Insufficient documentation

## 2017-09-13 DIAGNOSIS — R600 Localized edema: Secondary | ICD-10-CM | POA: Diagnosis present

## 2017-09-13 NOTE — Therapy (Signed)
[facility] [place], [place], [address] Phone: [phone]   Fax:  [phone]  Physical Therapy Treatment  Patient Details  Name: Roger Guerra MRN: [id number] Date of Birth: 09-Jun-1956 Referring Provider: Susa Day MD   Encounter Date: 09/13/2017  PT End of Session - 09/13/17 1725    Visit Number  20    Number of Visits  24    Date for PT Re-Evaluation  08/30/17    PT Start Time  0445    PT Stop Time  0540    PT Time Calculation (min)  55 min       Past Medical History:  Diagnosis Date  . Anxiety disorder   . Depression   . Erectile dysfunction   . GERD (gastroesophageal reflux disease)    history of  . History of DVT of lower extremity    Right leg 2002   . Hyperlipidemia   . Nerve root and plexus disorder   . Obesity   . Obstructive sleep apnea   . Pituitary disorder ([place])   . Pneumonia    history of  . Sciatica   . Sleep apnea    On CPAP   . Spondylosis   . Type 2 diabetes mellitus (HCC)    diet controlled    Past Surgical History:  Procedure Laterality Date  . BLADDER SURGERY  2015   removal of skin tag near urethra  . COLONOSCOPY N/A 12/10/2016   Procedure: COLONOSCOPY;  Surgeon: Daneil Dolin, MD;  [facility];  Service: Endoscopy;  Laterality: N/A;  8:30 AM  . KNEE ARTHROSCOPY WITH MEDIAL MENISECTOMY Right 08/25/2017   Procedure: Right knee arthroscopy, partial medial menisectomy and debridement;  Surgeon: Susa Day, MD;  [facility]: [facility];  Service: Orthopedics;  Laterality: Right;  60 mins  . POLYPECTOMY  12/10/2016   Procedure: POLYPECTOMY;  Surgeon: Daneil Dolin, MD;  [facility];  Service: Endoscopy;;  hepatic flexure  . TONSILLECTOMY      There were no vitals filed for this visit.  Subjective Assessment - 09/13/17 1732    Subjective  Patient underwent a right knee arthroscopic surgery on 08/25/17.  He reports his pain-level is a 3/10.  He is  pleased with the outcome of his surgery stating his knee is not popping like it was before surgery.    Pain Score  3     Pain Location  Knee    Pain Orientation  Right    Pain Descriptors / Indicators  Dull;Discomfort    Pain Type  Surgical pain    Pain Onset  1 to 4 weeks ago    Pain Frequency  Intermittent    Aggravating Factors   Increased walking/standing.    Pain Relieving Factors  Rest.                      [facility] Adult PT Treatment/Exercise - 09/13/17 0001      Modalities   Modalities  Electrical Stimulation;Vasopneumatic      Electrical Stimulation   Electrical Stimulation Location  Right knee.    Electrical Stimulation Action  IFC    Electrical Stimulation Parameters  1-10 Hz x 20 minutes.    Electrical Stimulation Goals  Edema;Pain      Vasopneumatic   Number Minutes Vasopneumatic   20 minutes    Vasopnuematic Location   -- Right knee.    Vasopneumatic Pressure  Medium  PT Long Term Goals - 09/13/17 1743      PT LONG TERM GOAL #1   Title  Independent with a HEP.    Time  4    Period  Weeks    Status  New      PT LONG TERM GOAL #3   Title  Walk a community distance with pain not > 2-3/10.    Time  4    Period  Weeks    Status  New            Plan - 09/13/17 1744    Clinical Impression Statement  The patient is doing very well following his right athroscopic knee surgery on 08/25/17.  Please see "therapy note" section.    PT Next Visit Plan  PAIN-FREE right quad strengthening.  E'stim and vaso.    Consulted and Agree with Plan of Care  Patient       Patient will benefit from skilled therapeutic intervention in order to improve the following deficits and impairments:     Visit Diagnosis: Chronic pain of right knee  Localized edema     Problem List Patient Active Problem List   Diagnosis Date Noted  . Male hypogonadism 07/24/2015  . Uncontrolled type 2 diabetes mellitus without complication, without  long-term current use of insulin ([place]) 07/24/2015  . Hyperlipidemia 07/24/2015   RE-EVAL:  Full right knee range of motion, 4+/5 right knee strength with good quad activation and minimal VMO atrophy.  No palpable pain.  Circumferential measurement at mid-patellar region= 1.5 cms > on right than left.  Non-antalgic gait pattern.  One Band-aid intact.   Abdulmalik Darco, [place] MPT 09/13/2017, 6:09 PM  [facility] [address] [place], [place], [address] Phone: [phone]   Fax:  [phone]  Name: Roger Guerra MRN: [id number] Date of Birth: [birth year]

## 2017-09-15 ENCOUNTER — Encounter: Payer: Self-pay | Admitting: Physical Therapy

## 2017-09-15 ENCOUNTER — Ambulatory Visit: Payer: BC Managed Care – PPO | Admitting: Physical Therapy

## 2017-09-15 DIAGNOSIS — R600 Localized edema: Secondary | ICD-10-CM

## 2017-09-15 DIAGNOSIS — M25561 Pain in right knee: Secondary | ICD-10-CM | POA: Diagnosis not present

## 2017-09-15 DIAGNOSIS — G8929 Other chronic pain: Secondary | ICD-10-CM

## 2017-09-15 NOTE — Therapy (Signed)
[facility] [place], [place], [address] Phone: [phone]   Fax:  [phone]  Physical Therapy Treatment  Patient Details  Name: Roger Guerra MRN: [id number] Date of Birth: [birth year] Referring Provider: Susa Day MD   Encounter Date: 09/15/2017  PT End of Session - 09/15/17 1522    Visit Number  21    Number of Visits  24    Date for PT Re-Evaluation  08/30/17    PT Start Time  9381    PT Stop Time  1612    PT Time Calculation (min)  56 min    Activity Tolerance  Patient tolerated treatment well    Behavior During Therapy  [facility] for tasks assessed/performed       Past Medical History:  Diagnosis Date  . Anxiety disorder   . Depression   . Erectile dysfunction   . GERD (gastroesophageal reflux disease)    history of  . History of DVT of lower extremity    Right leg 2002   . Hyperlipidemia   . Nerve root and plexus disorder   . Obesity   . Obstructive sleep apnea   . Pituitary disorder ([place])   . Pneumonia    history of  . Sciatica   . Sleep apnea    On CPAP   . Spondylosis   . Type 2 diabetes mellitus (HCC)    diet controlled    Past Surgical History:  Procedure Laterality Date  . BLADDER SURGERY  2015   removal of skin tag near urethra  . COLONOSCOPY N/A 12/10/2016   Procedure: COLONOSCOPY;  Surgeon: Daneil Dolin, MD;  [facility];  Service: Endoscopy;  Laterality: N/A;  8:30 AM  . KNEE ARTHROSCOPY WITH MEDIAL MENISECTOMY Right 08/25/2017   Procedure: Right knee arthroscopy, partial medial menisectomy and debridement;  Surgeon: Susa Day, MD;  [facility];  Service: Orthopedics;  Laterality: Right;  60 mins  . POLYPECTOMY  12/10/2016   Procedure: POLYPECTOMY;  Surgeon: Daneil Dolin, MD;  [facility];  Service: Endoscopy;;  hepatic flexure  . TONSILLECTOMY      There were no vitals filed for this visit.  Subjective Assessment - 09/15/17 1521    Subjective  Reports that he has a clicking in his R knee and states that they want him to work on that and if it doesn't help then he will have a TKR. Reports that quad strengtheing is also his biggest goal.    Pertinent History  See MRI report.    Limitations  Standing    How long can you stand comfortably?  10 minutes.    Patient Stated Goals  Get out of pain.  "Be able to pick up small items from the floor.    Currently in Pain?  No/denies         [facility] PT Assessment - 09/15/17 0001      Assessment   Onset Date/Surgical Date  08/25/17    Next MD Visit  10/10/2017      Precautions   Precautions  Fall      Restrictions   Weight Bearing Restrictions  No                  [facility] Adult PT Treatment/Exercise - 09/15/17 0001      Knee/Hip Exercises: Aerobic   Nustep  L5 x12 min      Knee/Hip Exercises: Standing   Terminal Knee Extension  Strengthening;Right;2 sets;10 reps;Theraband  Theraband Level (Terminal Knee Extension)  Level 3 (Green)    Forward Step Up  Right;20 reps;Hand Hold: 2;Step Height: 4" Unable to complete with 6" step    Rocker Board  3 minutes emphasis on gastroc stretch      Knee/Hip Exercises: Supine   Short Arc Quad Sets  Strengthening;Right;3 sets;10 reps with 3# ankleweight and emphasis on ankle DF for quad act.    Straight Leg Raises  Other (comment) Unable secondary to weakness      Modalities   Modalities  Psychologist, educational Location  R knee    Electrical Stimulation Action  IFC    Electrical Stimulation Parameters  1-10 hz x15 min    Electrical Stimulation Goals  Edema;Pain      Vasopneumatic   Number Minutes Vasopneumatic   15 minutes    Vasopnuematic Location   Knee    Vasopneumatic Pressure  Medium    Vasopneumatic Temperature   34                  PT Long Term Goals - 09/13/17 1743      PT LONG TERM GOAL #1   Title  Independent with a HEP.     Time  4    Period  Weeks    Status  New      PT LONG TERM GOAL #3   Title  Walk a community distance with pain not > 2-3/10.    Time  4    Period  Weeks    Status  New            Plan - 09/15/17 1615    Clinical Impression Statement  Patient arrived to treatment with reports of clicking in R knee. Patient unable to complete forward step ups to 6" step and SLR secondary to weakness. Popping sensation palpated in more R inferiolateral aspect of knee and patient does present with moderate tightness of R ITB. Quad strengthening advanced from QS to SAQ with 3# ankleweight as well as prolonged hold to assist in further strengthening. Patient highly encouraged by PTA to continue SAQ at home as he reported with ankle DF to emphasize quad activation. Normal modalities response noted following removal of the modalities. Patient also reported experiencing increased calf tightness as well during treatment.    Rehab Potential  Good    PT Frequency  3x / week    PT Duration  4 weeks    PT Treatment/Interventions  ADLs/Self Care Home Management;Cryotherapy;Electrical Stimulation;Moist Heat;Traction;Ultrasound;Therapeutic activities;Therapeutic exercise;Manual techniques;Patient/family education;Dry needling    PT Next Visit Plan  PAIN-FREE right quad strengthening.  E'stim and vaso.    Consulted and Agree with Plan of Care  Patient       Patient will benefit from skilled therapeutic intervention in order to improve the following deficits and impairments:  Decreased activity tolerance, Postural dysfunction, Pain, Decreased strength, Decreased range of motion  Visit Diagnosis: Chronic pain of right knee  Localized edema     Problem List Patient Active Problem List   Diagnosis Date Noted  . Male hypogonadism 07/24/2015  . Uncontrolled type 2 diabetes mellitus without complication, without long-term current use of insulin ([place]) 07/24/2015  . Hyperlipidemia 07/24/2015    Standley Brooking,  PTA 09/15/2017, 4:44 PM  [facility] [place], [place], [address] Phone: [phone]   Fax:  [phone]  Name: Roger Guerra MRN: [id number] Date of Birth:  09/02/1956   

## 2017-09-19 ENCOUNTER — Encounter: Payer: Self-pay | Admitting: Physical Therapy

## 2017-09-19 ENCOUNTER — Ambulatory Visit: Payer: BC Managed Care – PPO | Admitting: Physical Therapy

## 2017-09-19 DIAGNOSIS — R600 Localized edema: Secondary | ICD-10-CM

## 2017-09-19 DIAGNOSIS — G8929 Other chronic pain: Secondary | ICD-10-CM

## 2017-09-19 DIAGNOSIS — M25561 Pain in right knee: Principal | ICD-10-CM

## 2017-09-19 NOTE — Therapy (Signed)
[facility] [place], [place], [address] Phone: [phone]   Fax:  [phone]  Physical Therapy Treatment  Patient Details  Name: Roger Guerra MRN: [id number] Date of Birth: November 29, 1955 Referring Provider: Susa Day MD   Encounter Date: 09/19/2017  PT End of Session - 09/19/17 1012    Visit Number  22    Number of Visits  24    Date for PT Re-Evaluation  08/30/17    PT Start Time  0940    PT Stop Time  1043    PT Time Calculation (min)  63 min    Activity Tolerance  Patient tolerated treatment well    Behavior During Therapy  [facility] for tasks assessed/performed       Past Medical History:  Diagnosis Date  . Anxiety disorder   . Depression   . Erectile dysfunction   . GERD (gastroesophageal reflux disease)    history of  . History of DVT of lower extremity    Right leg 2002   . Hyperlipidemia   . Nerve root and plexus disorder   . Obesity   . Obstructive sleep apnea   . Pituitary disorder ([place])   . Pneumonia    history of  . Sciatica   . Sleep apnea    On CPAP   . Spondylosis   . Type 2 diabetes mellitus (HCC)    diet controlled    Past Surgical History:  Procedure Laterality Date  . BLADDER SURGERY  2015   removal of skin tag near urethra  . COLONOSCOPY N/A 12/10/2016   Procedure: COLONOSCOPY;  Surgeon: Daneil Dolin, MD;  [facility];  Service: Endoscopy;  Laterality: N/A;  8:30 AM  . KNEE ARTHROSCOPY WITH MEDIAL MENISECTOMY Right 08/25/2017   Procedure: Right knee arthroscopy, partial medial menisectomy and debridement;  Surgeon: Susa Day, MD;  [facility];  Service: Orthopedics;  Laterality: Right;  60 mins  . POLYPECTOMY  12/10/2016   Procedure: POLYPECTOMY;  Surgeon: Daneil Dolin, MD;  [facility];  Service: Endoscopy;;  hepatic flexure  . TONSILLECTOMY      There were no vitals filed for this visit.  Subjective Assessment - 09/19/17 1009    Subjective  No new complaints.    Pain Score  3     Pain Location  Knee    Pain Orientation  Right    Pain Descriptors / Indicators  Discomfort;Dull    Pain Type  Surgical pain    Pain Onset  1 to 4 weeks ago                      [facility] Adult PT Treatment/Exercise - 09/19/17 0001      Exercises   Exercises  Knee/Hip      Lumbar Exercises: Aerobic   Stationary Bike  Level 2 x 15 minutes.      Knee/Hip Exercises: Supine   Short Arc Quad Sets Limitations  SAQ's x 15 minutes facilitated with Bi-Phasic e'stim with 10 sec extension holds and 10 sec rest.      Modalities   Modalities  Electrical Stimulation;Vasopneumatic      Electrical Stimulation   Electrical Stimulation Location  RT knee.    Electrical Stimulation Action  IFC x 20 minutes.    Electrical Stimulation Goals  Edema;Pain      Vasopneumatic   Number Minutes Vasopneumatic   20 minutes    Vasopnuematic Location   -- Right  knee.    Vasopneumatic Pressure  Medium                  PT Long Term Goals - 09/13/17 1743      PT LONG TERM GOAL #1   Title  Independent with a HEP.    Time  4    Period  Weeks    Status  New      PT LONG TERM GOAL #3   Title  Walk a community distance with pain not > 2-3/10.    Time  4    Period  Weeks    Status  New            Plan - 09/19/17 1055    Clinical Impression Statement  Excellent response to treatment today.  No pain wiht SAQ's.    PT Next Visit Plan  PAIN-FREE right quad strengthening.  E'stim and vaso.    Consulted and Agree with Plan of Care  Patient       Patient will benefit from skilled therapeutic intervention in order to improve the following deficits and impairments:     Visit Diagnosis: Chronic pain of right knee  Localized edema     Problem List Patient Active Problem List   Diagnosis Date Noted  . Male hypogonadism 07/24/2015  . Uncontrolled type 2 diabetes mellitus without complication, without long-term current use  of insulin ([place]) 07/24/2015  . Hyperlipidemia 07/24/2015    Keirsten Matuska, [place] MPT 09/19/2017, 10:59 AM  [facility] [address] [place], [place], [address] Phone: [phone]   Fax:  [phone]  Name: Roger Guerra MRN: [id number] Date of Birth: 15-Aug-1956

## 2017-09-23 ENCOUNTER — Encounter: Payer: Self-pay | Admitting: Physical Therapy

## 2017-09-23 ENCOUNTER — Ambulatory Visit: Payer: BC Managed Care – PPO | Admitting: Physical Therapy

## 2017-09-23 DIAGNOSIS — R600 Localized edema: Secondary | ICD-10-CM

## 2017-09-23 DIAGNOSIS — M25561 Pain in right knee: Secondary | ICD-10-CM | POA: Diagnosis not present

## 2017-09-23 DIAGNOSIS — G8929 Other chronic pain: Secondary | ICD-10-CM

## 2017-09-23 NOTE — Therapy (Signed)
Roger Guerra, [place], [address] Phone: [phone]   Fax:  [phone]  Physical Therapy Treatment  Patient Details  Name: Roger Guerra MRN: [id number] Date of Birth: 29-Nov-1955 Referring Provider: Susa Day MD   Encounter Date: 09/23/2017  PT End of Session - 09/23/17 1148    Visit Number  23    Number of Visits  24    Date for PT Re-Evaluation  08/30/17    PT Start Time  1114    PT Stop Time  1210    PT Time Calculation (min)  56 min    Activity Tolerance  Patient tolerated treatment well    Behavior During Therapy  [facility] for tasks assessed/performed       Past Medical History:  Diagnosis Date  . Anxiety disorder   . Depression   . Erectile dysfunction   . GERD (gastroesophageal reflux disease)    history of  . History of DVT of lower extremity    Right leg 2002   . Hyperlipidemia   . Nerve root and plexus disorder   . Obesity   . Obstructive sleep apnea   . Pituitary disorder ([place])   . Pneumonia    history of  . Sciatica   . Sleep apnea    On CPAP   . Spondylosis   . Type 2 diabetes mellitus (HCC)    diet controlled    Past Surgical History:  Procedure Laterality Date  . BLADDER SURGERY  2015   removal of skin tag near urethra  . COLONOSCOPY N/A 12/10/2016   Procedure: COLONOSCOPY;  Surgeon: Daneil Dolin, MD;  [facility];  Service: Endoscopy;  Laterality: N/A;  8:30 AM  . KNEE ARTHROSCOPY WITH MEDIAL MENISECTOMY Right 08/25/2017   Procedure: Right knee arthroscopy, partial medial menisectomy and debridement;  Surgeon: Susa Day, MD;  [facility];  Service: Orthopedics;  Laterality: Right;  60 mins  . POLYPECTOMY  12/10/2016   Procedure: POLYPECTOMY;  Surgeon: Daneil Dolin, MD;  [facility];  Service: Endoscopy;;  hepatic flexure  . TONSILLECTOMY      There were no vitals filed for this visit.  Subjective Assessment - 09/23/17 1145     Subjective  Reports soreness from driving to [place].    Pertinent History  See MRI report.    Limitations  Standing    How long can you stand comfortably?  10 minutes.    Patient Stated Goals  Get out of pain.  "Be able to pick up small items from the floor.    Currently in Pain?  Yes    Pain Score  3     Pain Location  Knee    Pain Orientation  Right    Pain Descriptors / Indicators  Sore    Pain Type  Surgical pain    Pain Onset  1 to 4 weeks ago         [facility] PT Assessment - 09/23/17 0001      Assessment   Onset Date/Surgical Date  08/25/17    Next MD Visit  10/10/2017      Precautions   Precautions  Fall      Restrictions   Weight Bearing Restrictions  No                  [facility] Adult PT Treatment/Exercise - 09/23/17 0001      Knee/Hip Exercises: Aerobic   Stationary Bike  L2 x17 min  Knee/Hip Exercises: Standing   Terminal Knee Extension  Strengthening;Right;2 sets;10 reps;Theraband    Theraband Level (Terminal Knee Extension)  Other (comment) Pink XTS    Step Down  Right;Hand Hold: 2;Step Height: 4" Attempted R heel dot but unable d/t fatigue      Modalities   Modalities  Psychologist, educational Location  R VMO/ Wellsite geologist Action  VMS with SAQ    Electrical Stimulation Parameters  10/10, 5 sec ramp, 300 usec x10 min    Electrical Stimulation Goals  Neuromuscular facilitation      Vasopneumatic   Number Minutes Vasopneumatic   15 minutes    Vasopnuematic Location   Knee    Vasopneumatic Pressure  Medium    Vasopneumatic Temperature   64                  PT Long Term Goals - 09/23/17 1148      PT LONG TERM GOAL #1   Title  Independent with a HEP.    Time  4    Period  Weeks    Status  On-going      PT LONG TERM GOAL #3   Title  Walk a community distance with pain not > 2-3/10.    Time  4    Period  Weeks    Status  Achieved             Plan - 09/23/17 1213    Clinical Impression Statement  Patient still limited with R quad weakness as evidenced by inability to complete R heel dot but tolerated TKE well. Improvement noted in R quad activation with VMS and SAQ at this time. Closure of the incisions from arthroscopic surgery noted today. Normal modalities response noted following removal of the modalities.    Rehab Potential  Good    PT Frequency  3x / week    PT Duration  4 weeks    PT Treatment/Interventions  ADLs/Self Care Home Management;Cryotherapy;Electrical Stimulation;Moist Heat;Traction;Ultrasound;Therapeutic activities;Therapeutic exercise;Manual techniques;Patient/family education;Dry needling    PT Next Visit Plan  PAIN-FREE right quad strengthening.  E'stim and vaso.    Consulted and Agree with Plan of Care  Patient       Patient will benefit from skilled therapeutic intervention in order to improve the following deficits and impairments:  Decreased activity tolerance, Postural dysfunction, Pain, Decreased strength, Decreased range of motion  Visit Diagnosis: Chronic pain of right knee  Localized edema     Problem List Patient Active Problem List   Diagnosis Date Noted  . Male hypogonadism 07/24/2015  . Uncontrolled type 2 diabetes mellitus without complication, without long-term current use of insulin ([place]) 07/24/2015  . Hyperlipidemia 07/24/2015    Standley Brooking, PTA 09/23/2017, 12:16 PM  [facility] [address] [place], [place], [address] Phone: [phone]   Fax:  [phone]  Name: Roger Guerra MRN: [id number] Date of Birth: 12-27-1955

## 2017-09-26 ENCOUNTER — Ambulatory Visit: Payer: BC Managed Care – PPO | Admitting: Physical Therapy

## 2017-09-26 ENCOUNTER — Encounter: Payer: Self-pay | Admitting: Physical Therapy

## 2017-09-26 DIAGNOSIS — M25561 Pain in right knee: Principal | ICD-10-CM

## 2017-09-26 DIAGNOSIS — R600 Localized edema: Secondary | ICD-10-CM

## 2017-09-26 DIAGNOSIS — G8929 Other chronic pain: Secondary | ICD-10-CM

## 2017-09-26 NOTE — Therapy (Signed)
[facility] [place], [place], [address] Phone: [phone]   Fax:  [phone]  Physical Therapy Treatment  Patient Details  Name: Roger Guerra MRN: [id number] Date of Birth: 1956/07/17 Referring Provider: Susa Day MD   Encounter Date: 09/26/2017  PT End of Session - 09/26/17 1535    Visit Number  24    Number of Visits  30    Date for PT Re-Evaluation  10/25/17    PT Start Time  1115    PT Stop Time  1209    PT Time Calculation (min)  54 min    Activity Tolerance  Patient tolerated treatment well    Behavior During Therapy  [facility] for tasks assessed/performed       Past Medical History:  Diagnosis Date  . Anxiety disorder   . Depression   . Erectile dysfunction   . GERD (gastroesophageal reflux disease)    history of  . History of DVT of lower extremity    Right leg 2002   . Hyperlipidemia   . Nerve root and plexus disorder   . Obesity   . Obstructive sleep apnea   . Pituitary disorder ([place])   . Pneumonia    history of  . Sciatica   . Sleep apnea    On CPAP   . Spondylosis   . Type 2 diabetes mellitus (HCC)    diet controlled    Past Surgical History:  Procedure Laterality Date  . BLADDER SURGERY  2015   removal of skin tag near urethra  . COLONOSCOPY N/A 12/10/2016   Procedure: COLONOSCOPY;  Surgeon: Daneil Dolin, MD;  [facility];  Service: Endoscopy;  Laterality: N/A;  8:30 AM  . KNEE ARTHROSCOPY WITH MEDIAL MENISECTOMY Right 08/25/2017   Procedure: Right knee arthroscopy, partial medial menisectomy and debridement;  Surgeon: Susa Day, MD;  [facility]: [facility];  Service: Orthopedics;  Laterality: Right;  60 mins  . POLYPECTOMY  12/10/2016   Procedure: POLYPECTOMY;  Surgeon: Daneil Dolin, MD;  [facility];  Service: Endoscopy;;  hepatic flexure  . TONSILLECTOMY      There were no vitals filed for this visit.  Subjective Assessment - 09/26/17 1519    Subjective  I did fine after last treatment.  I get some clicking but no pain.    Pain Score  3     Pain Location  Knee    Pain Orientation  Right    Pain Descriptors / Indicators  Sore    Pain Type  Surgical pain    Pain Onset  1 to 4 weeks ago                      [facility] Adult PT Treatment/Exercise - 09/26/17 0001      Exercises   Exercises  Knee/Hip      Lumbar Exercises: Aerobic   Stationary Bike  Level 4 x 19 minutes.      Lumbar Exercises: Machines for Strengthening   Cybex Lumbar Extension  10# x 4 minutes from 90 to -20 degrees (pain-free ROM) x 4 minutes toatl.      Modalities   Modalities  Psychologist, educational Location  IFC    Electrical Stimulation Action  80-150 Hz x 20 minutes.    Electrical Stimulation Goals  Pain      Vasopneumatic   Number Minutes Vasopneumatic   20 minutes  Vasopnuematic Location   -- Right knee.    Vasopneumatic Pressure  Medium                  PT Long Term Goals - 09/23/17 1148      PT LONG TERM GOAL #1   Title  Independent with a HEP.    Time  4    Period  Weeks    Status  On-going      PT LONG TERM GOAL #3   Title  Walk a community distance with pain not > 2-3/10.    Time  4    Period  Weeks    Status  Achieved            Plan - 09/26/17 1533    Clinical Impression Statement  Patient did very well on weight machine today into extension within a pain-free range of motion.    PT Treatment/Interventions  ADLs/Self Care Home Management;Cryotherapy;Electrical Stimulation;Moist Heat;Traction;Ultrasound;Therapeutic activities;Therapeutic exercise;Manual techniques;Patient/family education;Dry needling    PT Next Visit Plan  PAIN-FREE right quad strengthening.  E'stim and vaso.    PT Home Exercise Plan  prone press ups and standing extension. R quad and L piriformis stretches.    Pt wants to Try DN To MD today    Consulted and  Agree with Plan of Care  Patient       Patient will benefit from skilled therapeutic intervention in order to improve the following deficits and impairments:  Decreased activity tolerance, Postural dysfunction, Pain, Decreased strength, Decreased range of motion  Visit Diagnosis: Chronic pain of right knee - Plan: PT plan of care cert/re-cert  Localized edema - Plan: PT plan of care cert/re-cert     Problem List Patient Active Problem List   Diagnosis Date Noted  . Male hypogonadism 07/24/2015  . Uncontrolled type 2 diabetes mellitus without complication, without long-term current use of insulin ([place]) 07/24/2015  . Hyperlipidemia 07/24/2015    Greidy Sherard, [place] MPT 09/26/2017, 3:38 PM  [facility] [address] [place], [place], [address] Phone: [phone]   Fax:  [phone]  Name: Roger Guerra MRN: [id number] Date of Birth: 1955/11/26

## 2017-10-04 ENCOUNTER — Encounter: Payer: Self-pay | Admitting: Physical Therapy

## 2017-10-04 ENCOUNTER — Ambulatory Visit: Payer: BC Managed Care – PPO | Attending: Specialist | Admitting: Physical Therapy

## 2017-10-04 DIAGNOSIS — M25561 Pain in right knee: Secondary | ICD-10-CM | POA: Insufficient documentation

## 2017-10-04 DIAGNOSIS — G8929 Other chronic pain: Secondary | ICD-10-CM | POA: Insufficient documentation

## 2017-10-04 DIAGNOSIS — R600 Localized edema: Secondary | ICD-10-CM | POA: Insufficient documentation

## 2017-10-04 NOTE — Therapy (Signed)
[facility] [place], [place], [address] Phone: [phone]   Fax:  [phone]  Physical Therapy Treatment  Patient Details  Name: Roger Guerra MRN: [id number] Date of Birth: 26-Aug-1956 Referring Provider: Susa Day MD   Encounter Date: 10/04/2017  PT End of Session - 10/04/17 1651    Visit Number  25    Number of Visits  30    Date for PT Re-Evaluation  10/25/17    PT Start Time  1600    PT Stop Time  1648    PT Time Calculation (min)  48 min    Activity Tolerance  Patient tolerated treatment well    Behavior During Therapy  [facility] for tasks assessed/performed       Past Medical History:  Diagnosis Date  . Anxiety disorder   . Depression   . Erectile dysfunction   . GERD (gastroesophageal reflux disease)    history of  . History of DVT of lower extremity    Right leg 2002   . Hyperlipidemia   . Nerve root and plexus disorder   . Obesity   . Obstructive sleep apnea   . Pituitary disorder ([place])   . Pneumonia    history of  . Sciatica   . Sleep apnea    On CPAP   . Spondylosis   . Type 2 diabetes mellitus (HCC)    diet controlled    Past Surgical History:  Procedure Laterality Date  . BLADDER SURGERY  2015   removal of skin tag near urethra  . COLONOSCOPY N/A 12/10/2016   Procedure: COLONOSCOPY;  Surgeon: Daneil Dolin, MD;  [facility];  Service: Endoscopy;  Laterality: N/A;  8:30 AM  . KNEE ARTHROSCOPY WITH MEDIAL MENISECTOMY Right 08/25/2017   Procedure: Right knee arthroscopy, partial medial menisectomy and debridement;  Surgeon: Susa Day, MD;  [facility];  Service: Orthopedics;  Laterality: Right;  60 mins  . POLYPECTOMY  12/10/2016   Procedure: POLYPECTOMY;  Surgeon: Daneil Dolin, MD;  [facility];  Service: Endoscopy;;  hepatic flexure  . TONSILLECTOMY      There were no vitals filed for this visit.  Subjective Assessment - 10/04/17 1643    Subjective   Reports that he is about 3/10 but reports he has difficulty with ascending stairs.    Pertinent History  See MRI report.    Limitations  Standing    How long can you stand comfortably?  10 minutes.    Patient Stated Goals  Get out of pain.  "Be able to pick up small items from the floor.    Currently in Pain?  Yes    Pain Score  3     Pain Location  Knee    Pain Orientation  Right    Pain Descriptors / Indicators  Aching    Pain Type  Surgical pain    Pain Onset  1 to 4 weeks ago         [facility] PT Assessment - 10/04/17 0001      Assessment   Onset Date/Surgical Date  08/25/17    Next MD Visit  10/10/2017      Precautions   Precautions  Fall      Restrictions   Weight Bearing Restrictions  No                  [facility] Adult PT Treatment/Exercise - 10/04/17 0001      Lumbar Exercises: Aerobic  Stationary Bike  L5 x10 min      Lumbar Exercises: Machines for Strengthening   Cybex Knee Extension  --    Cybex Knee Flexion  --      Knee/Hip Exercises: Machines for Strengthening   Cybex Knee Extension  10# 3x10 reps    Cybex Knee Flexion  30# 3x10 reps      Knee/Hip Exercises: Supine   Short Arc Quad Sets  Strengthening;Right with VMS    Straight Leg Raises  AROM;Right;20 reps      Knee/Hip Exercises: Sidelying   Hip ABduction  AROM;Right;20 reps      Modalities   Modalities  Artist  R VMO/ Research officer, trade union  VMS with SAQ    Electrical Stimulation Parameters  10/10, 50 pps, 5 sec ramp, 300 usec x15 min with SAQ    Electrical Stimulation Goals  Neuromuscular facilitation      Vasopneumatic   Number Minutes Vasopneumatic   15 minutes    Vasopnuematic Location   Knee    Vasopneumatic Pressure  Medium    Vasopneumatic Temperature   34                  PT Long Term Goals - 09/23/17 1148      PT LONG TERM GOAL #1   Title  Independent  with a HEP.    Time  4    Period  Weeks    Status  On-going      PT LONG TERM GOAL #3   Title  Walk a community distance with pain not > 2-3/10.    Time  4    Period  Weeks    Status  Achieved            Plan - 10/04/17 1736    Clinical Impression Statement  Patient continues to present with R knee discomfort and difficulty in ascending stairs. Patient experiencing minimal R knee clicking except when he focuses on R quad activation. Fairly good R quad activation noted with R SLR but VMS continued for advanced knee strengthening. Patient encouraged to complete machine strengthening within painfree range. Normal modalities response noted following removal of the modalities.    Rehab Potential  Good    PT Frequency  3x / week    PT Duration  4 weeks    PT Treatment/Interventions  ADLs/Self Care Home Management;Cryotherapy;Electrical Stimulation;Moist Heat;Traction;Ultrasound;Therapeutic activities;Therapeutic exercise;Manual techniques;Patient/family education;Dry needling    PT Next Visit Plan  PAIN-FREE right quad strengthening.  E'stim and vaso.    PT Home Exercise Plan  prone press ups and standing extension. R quad and L piriformis stretches.    Pt wants to Try DN To MD today    Consulted and Agree with Plan of Care  Patient       Patient will benefit from skilled therapeutic intervention in order to improve the following deficits and impairments:  Decreased activity tolerance, Postural dysfunction, Pain, Decreased strength, Decreased range of motion  Visit Diagnosis: Chronic pain of right knee  Localized edema     Problem List Patient Active Problem List   Diagnosis Date Noted  . Male hypogonadism 07/24/2015  . Uncontrolled type 2 diabetes mellitus without complication, without long-term current use of insulin ([place]) 07/24/2015  . Hyperlipidemia 07/24/2015    Standley Brooking, PTA 10/04/2017, 6:02 PM  [facility] [place],  [place], [address] Phone: [phone]   Fax:  [phone]  Name: Roger Guerra MRN: [id number] Date of Birth: [birth year]

## 2017-10-06 ENCOUNTER — Ambulatory Visit: Payer: BC Managed Care – PPO | Admitting: Physical Therapy

## 2017-10-06 ENCOUNTER — Encounter: Payer: Self-pay | Admitting: Physical Therapy

## 2017-10-06 DIAGNOSIS — R600 Localized edema: Secondary | ICD-10-CM

## 2017-10-06 DIAGNOSIS — G8929 Other chronic pain: Secondary | ICD-10-CM

## 2017-10-06 DIAGNOSIS — M25561 Pain in right knee: Principal | ICD-10-CM

## 2017-10-06 NOTE — Therapy (Signed)
[facility] [place], [place], [address] Phone: [phone]   Fax:  [phone]  Physical Therapy Treatment  Patient Details  Name: Roger Guerra MRN: [id number] Date of Birth: 1956-09-02 Referring Provider: Susa Day MD   Encounter Date: 10/06/2017  PT End of Session - 10/06/17 1645    Visit Number  26    Number of Visits  30    Date for PT Re-Evaluation  10/25/17    PT Start Time  2025    PT Stop Time  4270    PT Time Calculation (min)  53 min    Activity Tolerance  Patient tolerated treatment well    Behavior During Therapy  [facility] for tasks assessed/performed       Past Medical History:  Diagnosis Date  . Anxiety disorder   . Depression   . Erectile dysfunction   . GERD (gastroesophageal reflux disease)    history of  . History of DVT of lower extremity    Right leg 2002   . Hyperlipidemia   . Nerve root and plexus disorder   . Obesity   . Obstructive sleep apnea   . Pituitary disorder ([place])   . Pneumonia    history of  . Sciatica   . Sleep apnea    On CPAP   . Spondylosis   . Type 2 diabetes mellitus (HCC)    diet controlled    Past Surgical History:  Procedure Laterality Date  . BLADDER SURGERY  2015   removal of skin tag near urethra  . COLONOSCOPY N/A 12/10/2016   Procedure: COLONOSCOPY;  Surgeon: Daneil Dolin, MD;  [facility];  Service: Endoscopy;  Laterality: N/A;  8:30 AM  . KNEE ARTHROSCOPY WITH MEDIAL MENISECTOMY Right 08/25/2017   Procedure: Right knee arthroscopy, partial medial menisectomy and debridement;  Surgeon: Susa Day, MD;  [facility];  Service: Orthopedics;  Laterality: Right;  60 mins  . POLYPECTOMY  12/10/2016   Procedure: POLYPECTOMY;  Surgeon: Daneil Dolin, MD;  [facility];  Service: Endoscopy;;  hepatic flexure  . TONSILLECTOMY      There were no vitals filed for this visit.  Subjective Assessment - 10/06/17 1643     Subjective  Reports that he was very sore after Tuesday's PT.    Pertinent History  See MRI report.    Limitations  Standing    How long can you stand comfortably?  10 minutes.    Patient Stated Goals  Get out of pain.  "Be able to pick up small items from the floor.    Currently in Pain?  Yes    Pain Score  6     Pain Location  Knee    Pain Orientation  Right    Pain Descriptors / Indicators  Sore    Pain Type  Surgical pain    Pain Onset  1 to 4 weeks ago    Pain Frequency  Constant         [facility] PT Assessment - 10/06/17 0001      Assessment   Onset Date/Surgical Date  08/25/17    Next MD Visit  10/10/2017      Precautions   Precautions  Fall      Restrictions   Weight Bearing Restrictions  No                  [facility] Adult PT Treatment/Exercise - 10/06/17 0001      Knee/Hip  Exercises: Aerobic   Stationary Bike  L3 x15 min      Knee/Hip Exercises: Machines for Strengthening   Cybex Knee Extension  10# 3x10 reps    Cybex Knee Flexion  30# 3x10 reps      Knee/Hip Exercises: Standing   Terminal Knee Extension  Strengthening;Right;2 sets;10 reps;Limitations    Terminal Knee Extension Limitations  Pink XTS      Knee/Hip Exercises: Supine   Short Arc Quad Sets  Strengthening;Right with VMS    Straight Leg Raises  AROM;Right;20 reps      Modalities   Modalities  Artist  R VMO/ Research officer, trade union  VMS with SAQ    Electrical Stimulation Parameters  10/10, 50 pps, 5 sec ramp, 300 usec x10 min with SAQ    Electrical Stimulation Goals  Neuromuscular facilitation      Vasopneumatic   Number Minutes Vasopneumatic   15 minutes    Vasopnuematic Location   Knee    Vasopneumatic Pressure  Medium    Vasopneumatic Temperature   34                  PT Long Term Goals - 09/23/17 1148      PT LONG TERM GOAL #1   Title  Independent with a HEP.     Time  4    Period  Weeks    Status  On-going      PT LONG TERM GOAL #3   Title  Walk a community distance with pain not > 2-3/10.    Time  4    Period  Weeks    Status  Achieved            Plan - 10/06/17 1717    Clinical Impression Statement  Patient tolerated today's treatment well although he presented with soreness from strengthening exercises. Patient able to complete exercises as directed with emphasis on quad contraction. Patient's quad still deficient with strength and patient fatigued quickly with SLR. VMS continue with resisted SAQ without complaint from patient and good quad contraction achieved with electrical stimulation. Normal modalities response noted following removal of the modalities.    Rehab Potential  Good    PT Frequency  3x / week    PT Duration  4 weeks    PT Treatment/Interventions  ADLs/Self Care Home Management;Cryotherapy;Electrical Stimulation;Moist Heat;Traction;Ultrasound;Therapeutic activities;Therapeutic exercise;Manual techniques;Patient/family education;Dry needling    PT Next Visit Plan  PAIN-FREE right quad strengthening.  E'stim and vaso.    Consulted and Agree with Plan of Care  Patient       Patient will benefit from skilled therapeutic intervention in order to improve the following deficits and impairments:  Decreased activity tolerance, Postural dysfunction, Pain, Decreased strength, Decreased range of motion  Visit Diagnosis: Chronic pain of right knee  Localized edema     Problem List Patient Active Problem List   Diagnosis Date Noted  . Male hypogonadism 07/24/2015  . Uncontrolled type 2 diabetes mellitus without complication, without long-term current use of insulin ([place]) 07/24/2015  . Hyperlipidemia 07/24/2015    Standley Brooking, PTA 10/06/17 5:39 PM   [facility] [place], [place], [address] Phone: [phone]   Fax:  [phone]  Name: Roger Guerra MRN: [id number] Date of Birth: Feb 17, 1956

## 2017-10-11 ENCOUNTER — Encounter: Payer: Self-pay | Admitting: Physical Therapy

## 2017-10-11 ENCOUNTER — Ambulatory Visit: Payer: BC Managed Care – PPO | Admitting: Physical Therapy

## 2017-10-11 DIAGNOSIS — R600 Localized edema: Secondary | ICD-10-CM

## 2017-10-11 DIAGNOSIS — M25561 Pain in right knee: Secondary | ICD-10-CM | POA: Diagnosis not present

## 2017-10-11 DIAGNOSIS — G8929 Other chronic pain: Secondary | ICD-10-CM

## 2017-10-11 NOTE — Therapy (Addendum)
[facility] [place], [place], [address] Phone: [phone]   Fax:  [phone]  Physical Therapy Treatment  Patient Details  Name: Roger Guerra MRN: [id number] Date of Birth: 1955/09/29 Referring Provider: Susa Day MD   Encounter Date: 10/11/2017    Past Medical History:  Diagnosis Date  . Anxiety disorder   . Depression   . Erectile dysfunction   . GERD (gastroesophageal reflux disease)    history of  . History of DVT of lower extremity    Right leg 2002   . Hyperlipidemia   . Nerve root and plexus disorder   . Obesity   . Obstructive sleep apnea   . Pituitary disorder ([place])   . Pneumonia    history of  . Sciatica   . Sleep apnea    On CPAP   . Spondylosis   . Type 2 diabetes mellitus (HCC)    diet controlled    Past Surgical History:  Procedure Laterality Date  . BLADDER SURGERY  2015   removal of skin tag near urethra  . COLONOSCOPY N/A 12/10/2016   Procedure: COLONOSCOPY;  Surgeon: Daneil Dolin, MD;  [facility];  Service: Endoscopy;  Laterality: N/A;  8:30 AM  . KNEE ARTHROSCOPY WITH MEDIAL MENISECTOMY Right 08/25/2017   Procedure: Right knee arthroscopy, partial medial menisectomy and debridement;  Surgeon: Susa Day, MD;  [facility];  Service: Orthopedics;  Laterality: Right;  60 mins  . POLYPECTOMY  12/10/2016   Procedure: POLYPECTOMY;  Surgeon: Daneil Dolin, MD;  [facility];  Service: Endoscopy;;  hepatic flexure  . TONSILLECTOMY      There were no vitals filed for this visit.  Subjective Assessment - 10/13/17 1747    Subjective  I swam and did water aerobics for an hour after my injection and i did get sore and noticed some increased swelling.                      [facility] Adult PT Treatment/Exercise - 10/13/17 0001      Exercises   Exercises  Knee/Hip      Lumbar Exercises: Aerobic   Stationary Bike  Level 3 x 15 minutes.      Modalities   Modalities  Health visitor Stimulation Location  Right knee.    Electrical Stimulation Action  IFC at 80-150 Hz x 20 minutes.    Electrical Stimulation Goals  Pain      Vasopnuematic on medium x 15 minutes....treatment date 10/11/17.            PT Long Term Goals - 09/23/17 1148      PT LONG TERM GOAL #1   Title  Independent with a HEP.    Time  4    Period  Weeks    Status  On-going      PT LONG TERM GOAL #3   Title  Walk a community distance with pain not > 2-3/10.    Time  4    Period  Weeks    Status  Achieved              Patient will benefit from skilled therapeutic intervention in order to improve the following deficits and impairments:  Decreased activity tolerance, Postural dysfunction, Pain, Decreased strength, Decreased range of motion  Visit Diagnosis: Chronic pain of right knee - Plan: PT plan of care cert/re-cert  Localized  edema - Plan: PT plan of care cert/re-cert     Problem List Patient Active Problem List   Diagnosis Date Noted  . Male hypogonadism 07/24/2015  . Uncontrolled type 2 diabetes mellitus without complication, without long-term current use of insulin ([place]) 07/24/2015  . Hyperlipidemia 07/24/2015    Calleen Alvis, [place] MPT 10/13/2017, 5:51 PM  [facility] [address] [place], [place], [address] Phone: [phone]   Fax:  [phone]  Name: Isauro Skelley MRN: [id number] Date of Birth: [birth year]

## 2017-10-13 ENCOUNTER — Ambulatory Visit: Payer: BC Managed Care – PPO | Admitting: Physical Therapy

## 2017-10-13 DIAGNOSIS — M25561 Pain in right knee: Principal | ICD-10-CM

## 2017-10-13 DIAGNOSIS — R600 Localized edema: Secondary | ICD-10-CM

## 2017-10-13 DIAGNOSIS — G8929 Other chronic pain: Secondary | ICD-10-CM

## 2017-10-13 NOTE — Therapy (Signed)
[facility] [place], [place], [address] Phone: [phone]   Fax:  [phone]  Physical Therapy Treatment  Patient Details  Name: Roger Guerra MRN: [id number] Date of Birth: January 02, 1956 Referring Provider: Susa Day MD   Encounter Date: 10/13/2017  PT End of Session - 10/13/17 1752    Visit Number  28    Number of Visits  36    Date for PT Re-Evaluation  11/15/17    PT Start Time  0445    PT Stop Time  0531    PT Time Calculation (min)  46 min    Activity Tolerance  Patient tolerated treatment well    Behavior During Therapy  [facility] for tasks assessed/performed       Past Medical History:  Diagnosis Date  . Anxiety disorder   . Depression   . Erectile dysfunction   . GERD (gastroesophageal reflux disease)    history of  . History of DVT of lower extremity    Right leg 2002   . Hyperlipidemia   . Nerve root and plexus disorder   . Obesity   . Obstructive sleep apnea   . Pituitary disorder ([place])   . Pneumonia    history of  . Sciatica   . Sleep apnea    On CPAP   . Spondylosis   . Type 2 diabetes mellitus (HCC)    diet controlled    Past Surgical History:  Procedure Laterality Date  . BLADDER SURGERY  2015   removal of skin tag near urethra  . COLONOSCOPY N/A 12/10/2016   Procedure: COLONOSCOPY;  Surgeon: Daneil Dolin, MD;  [facility];  Service: Endoscopy;  Laterality: N/A;  8:30 AM  . KNEE ARTHROSCOPY WITH MEDIAL MENISECTOMY Right 08/25/2017   Procedure: Right knee arthroscopy, partial medial menisectomy and debridement;  Surgeon: Susa Day, MD;  [facility];  Service: Orthopedics;  Laterality: Right;  60 mins  . POLYPECTOMY  12/10/2016   Procedure: POLYPECTOMY;  Surgeon: Daneil Dolin, MD;  [facility];  Service: Endoscopy;;  hepatic flexure  . TONSILLECTOMY      There were no vitals filed for this visit.  Subjective Assessment - 10/13/17 1747    Subjective  I swam and did water aerobics for an hour after my injection and i did get sore and noticed some increased swelling.                      [facility] Adult PT Treatment/Exercise - 10/13/17 0001      Exercises   Exercises  Knee/Hip      Lumbar Exercises: Aerobic   Stationary Bike  Level 3 x 15 minutes.      Modalities   Modalities  Health visitor Stimulation Location  Right knee.    Electrical Stimulation Action  IFC at 80-150 Hz x 20 minutes.    Electrical Stimulation Goals  Pain      Vasopneumatic   Number Minutes Vasopneumatic   20 minutes    Vasopnuematic Location   -- Right knee.    Vasopneumatic Pressure  -- Medium.                  PT Long Term Goals - 09/23/17 1148      PT LONG TERM GOAL #1   Title  Independent with a HEP.    Time  4    Period  Weeks    Status  On-going      PT LONG TERM GOAL #3   Title  Walk a community distance with pain not > 2-3/10.    Time  4    Period  Weeks    Status  Achieved            Plan - 10/13/17 1753    Clinical Impression Statement  Conservative treatment today due to subjective complaints.    PT Treatment/Interventions  ADLs/Self Care Home Management;Cryotherapy;Electrical Stimulation;Moist Heat;Traction;Ultrasound;Therapeutic activities;Therapeutic exercise;Manual techniques;Patient/family education;Dry needling    PT Next Visit Plan  PAIN-FREE right quad strengthening.  E'stim and vaso.    Consulted and Agree with Plan of Care  Patient       Patient will benefit from skilled therapeutic intervention in order to improve the following deficits and impairments:  Decreased activity tolerance, Postural dysfunction, Pain, Decreased strength, Decreased range of motion  Visit Diagnosis: Chronic pain of right knee  Localized edema     Problem List Patient Active Problem List   Diagnosis Date Noted  . Male hypogonadism  07/24/2015  . Uncontrolled type 2 diabetes mellitus without complication, without long-term current use of insulin ([place]) 07/24/2015  . Hyperlipidemia 07/24/2015    Lakeeta Dobosz, [place]  MPT 10/13/2017, 5:55 PM  [facility] [address] [place], [place], [address] Phone: [phone]   Fax:  [phone]  Name: Roger Guerra MRN: [id number] Date of Birth: 1956-06-21

## 2017-10-18 ENCOUNTER — Ambulatory Visit: Payer: BC Managed Care – PPO | Admitting: Physical Therapy

## 2017-10-18 DIAGNOSIS — G8929 Other chronic pain: Secondary | ICD-10-CM

## 2017-10-18 DIAGNOSIS — M25561 Pain in right knee: Principal | ICD-10-CM

## 2017-10-18 DIAGNOSIS — R600 Localized edema: Secondary | ICD-10-CM

## 2017-10-18 NOTE — Therapy (Signed)
[facility] [place], [place], [address] Phone: [phone]   Fax:  [phone]  Physical Therapy Treatment  Patient Details  Name: Roger Guerra MRN: [id number] Date of Birth: 1956-09-09 Referring Provider: Susa Day MD   Encounter Date: 10/18/2017  PT End of Session - 10/18/17 1649    Visit Number  29    Number of Visits  36    Date for PT Re-Evaluation  11/15/17    PT Start Time  1648    PT Stop Time  1740    PT Time Calculation (min)  52 min    Activity Tolerance  Patient tolerated treatment well    Behavior During Therapy  [facility] for tasks assessed/performed       Past Medical History:  Diagnosis Date  . Anxiety disorder   . Depression   . Erectile dysfunction   . GERD (gastroesophageal reflux disease)    history of  . History of DVT of lower extremity    Right leg 2002   . Hyperlipidemia   . Nerve root and plexus disorder   . Obesity   . Obstructive sleep apnea   . Pituitary disorder ([place])   . Pneumonia    history of  . Sciatica   . Sleep apnea    On CPAP   . Spondylosis   . Type 2 diabetes mellitus (HCC)    diet controlled    Past Surgical History:  Procedure Laterality Date  . BLADDER SURGERY  2015   removal of skin tag near urethra  . COLONOSCOPY N/A 12/10/2016   Procedure: COLONOSCOPY;  Surgeon: Daneil Dolin, MD;  [facility];  Service: Endoscopy;  Laterality: N/A;  8:30 AM  . KNEE ARTHROSCOPY WITH MEDIAL MENISECTOMY Right 08/25/2017   Procedure: Right knee arthroscopy, partial medial menisectomy and debridement;  Surgeon: Susa Day, MD;  [facility]: [facility];  Service: Orthopedics;  Laterality: Right;  60 mins  . POLYPECTOMY  12/10/2016   Procedure: POLYPECTOMY;  Surgeon: Daneil Dolin, MD;  [facility];  Service: Endoscopy;;  hepatic flexure  . TONSILLECTOMY      There were no vitals filed for this visit.  Subjective Assessment - 10/18/17 1651    Subjective  Patient reported feeling "fine". Patient denies any pain, just soreness in anterior knee below the patella.    Currently in Pain?  No/denies soreness only    Pain Location  Knee    Pain Orientation  Right    Pain Descriptors / Indicators  Sore                      [facility] Adult PT Treatment/Exercise - 10/18/17 0001      Exercises   Exercises  Knee/Hip      Lumbar Exercises: Aerobic   Stationary Bike  Level 3 x 15 minutes.      Knee/Hip Exercises: Seated   Long Arc Quad  Strengthening;1 set;15 reps;Weights 4#    Hamstring Curl  Strengthening;Right;20 reps Seated with Yellow Theraband      Knee/Hip Exercises: Supine   Short Arc Quad Sets  Strengthening;Right;2 sets;10 reps    Hip Adduction Isometric  Strengthening;Both;15 reps 5 second hold    Other Supine Knee/Hip Exercises  supine hip abduction with Red theraband 2x10      Vasopneumatic   Number Minutes Vasopneumatic   10 minutes    Vasopnuematic Location   Knee    Vasopneumatic Pressure  Medium  PT Long Term Goals - 09/23/17 1148      PT LONG TERM GOAL #1   Title  Independent with a HEP.    Time  4    Period  Weeks    Status  On-going      PT LONG TERM GOAL #3   Title  Walk a community distance with pain not > 2-3/10.    Time  4    Period  Weeks    Status  Achieved            Plan - 10/18/17 1737    Clinical Impression Statement  Patient was able to complete exercises with no increase of pain or discomfort. Patient attempted step up exercise on 4" step to strengthen right quad, however, patient was unable to perform without use of upper extremity. Quad strengthening regressed to seated LAQ with 4# weight. Patient demonstrated adequate quadricep activation with exercise. No adverse affects found upon removal of vasopneumatic device.    Rehab Potential  Good    PT Frequency  3x / week    PT Duration  4 weeks    PT Treatment/Interventions  ADLs/Self Care Home  Management;Cryotherapy;Electrical Stimulation;Moist Heat;Traction;Ultrasound;Therapeutic activities;Therapeutic exercise;Manual techniques;Patient/family education;Dry needling    PT Next Visit Plan  Continue plan of care to strengthen and stablize right knee for improved function.    Consulted and Agree with Plan of Care  Patient       Patient will benefit from skilled therapeutic intervention in order to improve the following deficits and impairments:  Decreased activity tolerance, Postural dysfunction, Pain, Decreased strength, Decreased range of motion  Visit Diagnosis: Chronic pain of right knee  Localized edema     Problem List Patient Active Problem List   Diagnosis Date Noted  . Male hypogonadism 07/24/2015  . Uncontrolled type 2 diabetes mellitus without complication, without long-term current use of insulin ([place]) 07/24/2015  . Hyperlipidemia 07/24/2015   Gabriela Eves, PT, DPT 10/18/2017, 5:46 PM  [facility] [address] [place], [place], [address] Phone: [phone]   Fax:  [phone]  Name: Governor Matos MRN: [id number] Date of Birth: [birth year]

## 2017-10-20 ENCOUNTER — Encounter: Payer: Self-pay | Admitting: Physical Therapy

## 2017-11-01 ENCOUNTER — Ambulatory Visit: Payer: BC Managed Care – PPO | Attending: Specialist | Admitting: Physical Therapy

## 2017-11-01 ENCOUNTER — Encounter: Payer: Self-pay | Admitting: Physical Therapy

## 2017-11-01 DIAGNOSIS — R600 Localized edema: Secondary | ICD-10-CM | POA: Diagnosis present

## 2017-11-01 DIAGNOSIS — G8929 Other chronic pain: Secondary | ICD-10-CM | POA: Insufficient documentation

## 2017-11-01 DIAGNOSIS — M25561 Pain in right knee: Secondary | ICD-10-CM | POA: Diagnosis present

## 2017-11-01 NOTE — Therapy (Signed)
[facility] [place], [place], [address] Phone: [phone]   Fax:  [phone]  Physical Therapy Treatment  Patient Details  Name: Roger Guerra MRN: [id number] Date of Birth: Oct 01, 1955 Referring Provider: Susa Day MD   Encounter Date: 11/01/2017  PT End of Session - 11/01/17 1659    Visit Number  30    Number of Visits  36    Date for PT Re-Evaluation  11/15/17    PT Start Time  9518    PT Stop Time  1742    PT Time Calculation (min)  56 min    Activity Tolerance  Patient tolerated treatment well    Behavior During Therapy  [facility] for tasks assessed/performed       Past Medical History:  Diagnosis Date  . Anxiety disorder   . Depression   . Erectile dysfunction   . GERD (gastroesophageal reflux disease)    history of  . History of DVT of lower extremity    Right leg 2002   . Hyperlipidemia   . Nerve root and plexus disorder   . Obesity   . Obstructive sleep apnea   . Pituitary disorder ([place])   . Pneumonia    history of  . Sciatica   . Sleep apnea    On CPAP   . Spondylosis   . Type 2 diabetes mellitus (HCC)    diet controlled    Past Surgical History:  Procedure Laterality Date  . BLADDER SURGERY  2015   removal of skin tag near urethra  . COLONOSCOPY N/A 12/10/2016   Procedure: COLONOSCOPY;  Surgeon: Daneil Dolin, MD;  [facility];  Service: Endoscopy;  Laterality: N/A;  8:30 AM  . KNEE ARTHROSCOPY WITH MEDIAL MENISECTOMY Right 08/25/2017   Procedure: Right knee arthroscopy, partial medial menisectomy and debridement;  Surgeon: Susa Day, MD;  [facility];  Service: Orthopedics;  Laterality: Right;  60 mins  . POLYPECTOMY  12/10/2016   Procedure: POLYPECTOMY;  Surgeon: Daneil Dolin, MD;  [facility];  Service: Endoscopy;;  hepatic flexure  . TONSILLECTOMY      There were no vitals filed for this visit.  Subjective Assessment - 11/01/17 1658    Subjective   Reports that his knee is about the same. Reports that he still cannot get up the stairs without compensating. Reports that his pain is more inferior knee. Patient waiting to hear back from MD office regarding gel injections.    Pertinent History  See MRI report.    Limitations  Standing    How long can you stand comfortably?  10 minutes.    Patient Stated Goals  Get out of pain.  "Be able to pick up small items from the floor.    Currently in Pain?  Yes    Pain Score  3     Pain Location  Knee    Pain Orientation  Right    Pain Descriptors / Indicators  Discomfort    Pain Type  Surgical pain    Pain Onset  1 to 4 weeks ago         [facility] PT Assessment - 11/01/17 0001      Assessment   Onset Date/Surgical Date  08/25/17    Next MD Visit  TBD      Precautions   Precautions  Fall      Restrictions   Weight Bearing Restrictions  No  [facility] Adult PT Treatment/Exercise - 11/01/17 0001      Lumbar Exercises: Aerobic   Stationary Bike  --      Knee/Hip Exercises: Aerobic   Stationary Bike  L3 x18 min      Knee/Hip Exercises: Machines for Strengthening   Cybex Knee Extension  10# x30 reps in painfree range    Cybex Knee Flexion  30# 3x10 reps    Cybex Leg Press  2 pl, seat 8 x20 reps      Knee/Hip Exercises: Standing   Terminal Knee Extension  Strengthening;Right;20 reps      Knee/Hip Exercises: Supine   Straight Leg Raises  Strengthening;Right;20 reps;Limitations    Straight Leg Raises Limitations  Max emphasis on quad contraction      Modalities   Modalities  Vasopneumatic      Vasopneumatic   Number Minutes Vasopneumatic   15 minutes    Vasopnuematic Location   Knee    Vasopneumatic Pressure  Medium    Vasopneumatic Temperature   34                  PT Long Term Goals - 09/23/17 1148      PT LONG TERM GOAL #1   Title  Independent with a HEP.    Time  4    Period  Weeks    Status  On-going      PT LONG TERM GOAL #3    Title  Walk a community distance with pain not > 2-3/10.    Time  4    Period  Weeks    Status  Achieved            Plan - 11/01/17 1737    Clinical Impression Statement  Patient tolerated today's treatment well although he continues to have inferior and medial knee pain. Patient instructed throughout treatment to avoid R knee pain. Clicking palpated along R medial knee during machine knee extension. Leg press stopped within pain free range as he reported a twinge of pain in R knee. Patient reported with SLR of discomfort under the R patella and into inferior knee. Normal vasopnuematic response noted following removal of the modality.    Rehab Potential  Good    PT Frequency  3x / week    PT Duration  4 weeks    PT Treatment/Interventions  ADLs/Self Care Home Management;Cryotherapy;Electrical Stimulation;Moist Heat;Traction;Ultrasound;Therapeutic activities;Therapeutic exercise;Manual techniques;Patient/family education;Dry needling    PT Next Visit Plan  Continue plan of care to strengthen and stablize right knee for improved function.    Consulted and Agree with Plan of Care  Patient       Patient will benefit from skilled therapeutic intervention in order to improve the following deficits and impairments:  Decreased activity tolerance, Postural dysfunction, Pain, Decreased strength, Decreased range of motion  Visit Diagnosis: Chronic pain of right knee  Localized edema     Problem List Patient Active Problem List   Diagnosis Date Noted  . Male hypogonadism 07/24/2015  . Uncontrolled type 2 diabetes mellitus without complication, without long-term current use of insulin ([place]) 07/24/2015  . Hyperlipidemia 07/24/2015    Standley Brooking, PTA 11/01/2017, 5:50 PM  [facility] [address] [place], [place], [address] Phone: [phone]   Fax:  [phone]  Name: Roger Guerra MRN: [id number] Date of Birth: 1956/02/27

## 2017-11-14 ENCOUNTER — Ambulatory Visit: Payer: BC Managed Care – PPO | Admitting: Physical Therapy

## 2017-11-14 ENCOUNTER — Encounter: Payer: Self-pay | Admitting: Physical Therapy

## 2017-11-14 DIAGNOSIS — M25561 Pain in right knee: Secondary | ICD-10-CM | POA: Diagnosis not present

## 2017-11-14 DIAGNOSIS — R600 Localized edema: Secondary | ICD-10-CM

## 2017-11-14 DIAGNOSIS — G8929 Other chronic pain: Secondary | ICD-10-CM

## 2017-11-14 NOTE — Therapy (Signed)
[facility] [place], [place], [address] Phone: [phone]   Fax:  [phone]  Physical Therapy Treatment  Patient Details  Name: Roger Guerra MRN: [id number] Date of Birth: Apr 08, 1956 Referring Provider: Susa Day MD   Encounter Date: 11/14/2017  PT End of Session - 11/14/17 0923    Visit Number  31    Number of Visits  36    Date for PT Re-Evaluation  11/15/17    PT Start Time  1030    PT Stop Time  1127    PT Time Calculation (min)  57 min    Activity Tolerance  Patient tolerated treatment well    Behavior During Therapy  [facility] for tasks assessed/performed       Past Medical History:  Diagnosis Date  . Anxiety disorder   . Depression   . Erectile dysfunction   . GERD (gastroesophageal reflux disease)    history of  . History of DVT of lower extremity    Right leg 2002   . Hyperlipidemia   . Nerve root and plexus disorder   . Obesity   . Obstructive sleep apnea   . Pituitary disorder ([place])   . Pneumonia    history of  . Sciatica   . Sleep apnea    On CPAP   . Spondylosis   . Type 2 diabetes mellitus (HCC)    diet controlled    Past Surgical History:  Procedure Laterality Date  . BLADDER SURGERY  2015   removal of skin tag near urethra  . COLONOSCOPY N/A 12/10/2016   Procedure: COLONOSCOPY;  Surgeon: Daneil Dolin, MD;  [facility];  Service: Endoscopy;  Laterality: N/A;  8:30 AM  . KNEE ARTHROSCOPY WITH MEDIAL MENISECTOMY Right 08/25/2017   Procedure: Right knee arthroscopy, partial medial menisectomy and debridement;  Surgeon: Susa Day, MD;  [facility];  Service: Orthopedics;  Laterality: Right;  60 mins  . POLYPECTOMY  12/10/2016   Procedure: POLYPECTOMY;  Surgeon: Daneil Dolin, MD;  [facility];  Service: Endoscopy;;  hepatic flexure  . TONSILLECTOMY      There were no vitals filed for this visit.  Subjective Assessment - 11/14/17 1224    Subjective  I feel about 70% better.  Still can't do stairs well though.    Pain Score  3     Pain Location  Knee    Pain Orientation  Right    Pain Descriptors / Indicators  Discomfort    Pain Type  Surgical pain    Pain Onset  1 to 4 weeks ago                      [facility] Adult PT Treatment/Exercise - 11/14/17 0001      Exercises   Exercises  Knee/Hip      Lumbar Exercises: Aerobic   Stationary Bike  Level 3 x 15 minutes.      Lumbar Exercises: Machines for Strengthening   Cybex Knee Extension  10# x 5 minutes performed slowly with rest as needed.    Leg Press  2 plates x 2 minutes.      Knee/Hip Exercises: Standing   Wall Squat Limitations  x 2 minutes within pain-free ROM.      Knee/Hip Exercises: Supine   Short Arc Quad Sets Limitations  7.5# x 3 minutes right knee.      Modalities   Modalities  Electrical Stimulation;Vasopneumatic  Acupuncturist Location  Right knee.    Electrical Stimulation Action  IFC    Electrical Stimulation Parameters  80-150 Hz x 20 minutes.    Electrical Stimulation Goals  Pain      Vasopneumatic   Number Minutes Vasopneumatic   20 minutes    Vasopnuematic Location   -- Right knee.    Vasopneumatic Pressure  Medium                  PT Long Term Goals - 09/23/17 1148      PT LONG TERM GOAL #1   Title  Independent with a HEP.    Time  4    Period  Weeks    Status  On-going      PT LONG TERM GOAL #3   Title  Walk a community distance with pain not > 2-3/10.    Time  4    Period  Weeks    Status  Achieved            Plan - 11/14/17 1241    Clinical Impression Statement  The patient did an excellent job today performing all exercises through a pain-free range of motion.  He is not able to perform full extension on weight maching but is able to do so our a bolster.    PT Treatment/Interventions  ADLs/Self Care Home Management;Cryotherapy;Electrical Stimulation;Moist  Heat;Traction;Ultrasound;Therapeutic activities;Therapeutic exercise;Manual techniques;Patient/family education;Dry needling    PT Next Visit Plan  Continue plan of care to strengthen and stablize right knee for improved function.    PT Home Exercise Plan  prone press ups and standing extension. R quad and L piriformis stretches.    Pt wants to Try DN To MD today    Consulted and Agree with Plan of Care  Patient       Patient will benefit from skilled therapeutic intervention in order to improve the following deficits and impairments:  Decreased activity tolerance, Postural dysfunction, Pain, Decreased strength, Decreased range of motion  Visit Diagnosis: Chronic pain of right knee  Localized edema     Problem List Patient Active Problem List   Diagnosis Date Noted  . Male hypogonadism 07/24/2015  . Uncontrolled type 2 diabetes mellitus without complication, without long-term current use of insulin ([place]) 07/24/2015  . Hyperlipidemia 07/24/2015    [place], [place] MPT 11/14/2017, 12:49 PM  [facility] [address] [place], [place], [address] Phone: [phone]   Fax:  [phone]  Name: Roger Guerra MRN: [id number] Date of Birth: 16-Dec-1955

## 2017-11-22 ENCOUNTER — Ambulatory Visit: Payer: BC Managed Care – PPO | Admitting: Physical Therapy

## 2017-11-22 ENCOUNTER — Encounter: Payer: Self-pay | Admitting: Physical Therapy

## 2017-11-22 DIAGNOSIS — M25561 Pain in right knee: Principal | ICD-10-CM

## 2017-11-22 DIAGNOSIS — R600 Localized edema: Secondary | ICD-10-CM

## 2017-11-22 DIAGNOSIS — G8929 Other chronic pain: Secondary | ICD-10-CM

## 2017-11-22 NOTE — Therapy (Addendum)
[facility] [place], [place], [address] Phone: [phone]   Fax:  [phone]  Physical Therapy Treatment  Patient Details  Name: Roger Guerra MRN: [id number] Date of Birth: Sep 24, 1956 Referring Provider: Susa Day MD   Encounter Date: 11/22/2017  PT End of Session - 11/22/17 1755    Visit Number  32    Number of Visits  36    Date for PT Re-Evaluation  11/15/17    PT Start Time  0445    PT Stop Time  0539    PT Time Calculation (min)  54 min    Activity Tolerance  Patient tolerated treatment well    Behavior During Therapy  [facility] for tasks assessed/performed       Past Medical History:  Diagnosis Date  . Anxiety disorder   . Depression   . Erectile dysfunction   . GERD (gastroesophageal reflux disease)    history of  . History of DVT of lower extremity    Right leg 2002   . Hyperlipidemia   . Nerve root and plexus disorder   . Obesity   . Obstructive sleep apnea   . Pituitary disorder ([place])   . Pneumonia    history of  . Sciatica   . Sleep apnea    On CPAP   . Spondylosis   . Type 2 diabetes mellitus (HCC)    diet controlled    Past Surgical History:  Procedure Laterality Date  . BLADDER SURGERY  2015   removal of skin tag near urethra  . COLONOSCOPY N/A 12/10/2016   Procedure: COLONOSCOPY;  Surgeon: Daneil Dolin, MD;  [facility];  Service: Endoscopy;  Laterality: N/A;  8:30 AM  . KNEE ARTHROSCOPY WITH MEDIAL MENISECTOMY Right 08/25/2017   Procedure: Right knee arthroscopy, partial medial menisectomy and debridement;  Surgeon: Susa Day, MD;  [facility];  Service: Orthopedics;  Laterality: Right;  60 mins  . POLYPECTOMY  12/10/2016   Procedure: POLYPECTOMY;  Surgeon: Daneil Dolin, MD;  [facility];  Service: Endoscopy;;  hepatic flexure  . TONSILLECTOMY      There were no vitals filed for this visit.  Subjective Assessment - 11/22/17 1745    Subjective  My quads feel stronger.  I'm going for injections over the next few weeks so I don't know when I'll be back.    Currently in Pain?  Yes    Pain Score  2     Pain Location  Knee    Pain Orientation  Right    Pain Descriptors / Indicators  Discomfort    Pain Type  Surgical pain    Pain Onset  1 to 4 weeks ago                      [facility] Adult PT Treatment/Exercise - 11/22/17 0001      Exercises   Exercises  Knee/Hip      Lumbar Exercises: Aerobic   Stationary Bike  Level 3 x 15 minutes.      Lumbar Exercises: Machines for Strengthening   Cybex Knee Extension  10# x 5 minutes within pain-free ROM.      Knee/Hip Exercises: Supine   Short Arc Quad Sets Limitations  7.5# x 3 minutes.      Modalities   Modalities  Health visitor Stimulation Location  Right knee.    Chartered certified accountant  IFC    Electrical Stimulation Parameters  80-150 Hz x 20 minutes.    Electrical Stimulation Goals  Pain      Vasopneumatic   Number Minutes Vasopneumatic   20 minutes    Vasopnuematic Location   -- Right knee.    Vasopneumatic Pressure  Medium                  PT Long Term Goals - 09/23/17 1148      PT LONG TERM GOAL #1   Title  Independent with a HEP.    Time  4    Period  Weeks    Status  On-going      PT LONG TERM GOAL #3   Title  Walk a community distance with pain not > 2-3/10.    Time  4    Period  Weeks    Status  Achieved            Plan - 11/22/17 1753    Clinical Impression Statement  Excellent job today.  No significant pain complaints after treatment.  Patient will be going in for a series of injections and may not return to PT.    PT Treatment/Interventions  ADLs/Self Care Home Management;Cryotherapy;Electrical Stimulation;Moist Heat;Traction;Ultrasound;Therapeutic activities;Therapeutic exercise;Manual techniques;Patient/family education;Dry needling    PT  Next Visit Plan  Continue plan of care to strengthen and stablize right knee for improved function.    PT Home Exercise Plan  prone press ups and standing extension. R quad and L piriformis stretches.    Pt wants to Try DN To MD today    Consulted and Agree with Plan of Care  Patient       Patient will benefit from skilled therapeutic intervention in order to improve the following deficits and impairments:  Decreased activity tolerance, Postural dysfunction, Pain, Decreased strength, Decreased range of motion  Visit Diagnosis: Chronic pain of right knee  Localized edema     Problem List Patient Active Problem List   Diagnosis Date Noted  . Male hypogonadism 07/24/2015  . Uncontrolled type 2 diabetes mellitus without complication, without long-term current use of insulin ([place]) 07/24/2015  . Hyperlipidemia 07/24/2015    Maritta Kief, [place] MPT 11/22/2017, 5:55 PM  [facility] [address] [place], [place], [address] Phone: [phone]   Fax:  [phone]  Name: Roger Guerra MRN: [id number] Date of Birth: Oct 19, 1955  PHYSICAL THERAPY DISCHARGE SUMMARY  Visits from Start of Care: 71.  Current functional level related to goals / functional outcomes: See above.   Remaining deficits: LTG #3 met.   Education / Equipment: HEP. Plan: Patient agrees to discharge.  Patient goals were partially met. Patient is being discharged due to being pleased with the current functional level.  ?????         [place] Din Bookwalter MPT

## 2019-05-09 ENCOUNTER — Other Ambulatory Visit (HOSPITAL_COMMUNITY): Payer: Self-pay | Admitting: Internal Medicine

## 2019-05-09 ENCOUNTER — Other Ambulatory Visit: Payer: Self-pay

## 2019-05-09 ENCOUNTER — Ambulatory Visit (HOSPITAL_COMMUNITY)
Admission: RE | Admit: 2019-05-09 | Discharge: 2019-05-09 | Disposition: A | Discharge status: Home / Self Care | Payer: BC Managed Care – PPO | Source: Ambulatory Visit | Attending: Internal Medicine | Admitting: Internal Medicine

## 2019-05-09 DIAGNOSIS — R0602 Shortness of breath: Secondary | ICD-10-CM | POA: Diagnosis not present

## 2020-08-18 ENCOUNTER — Other Ambulatory Visit: Payer: Self-pay | Admitting: Internal Medicine

## 2020-08-18 ENCOUNTER — Other Ambulatory Visit (HOSPITAL_COMMUNITY): Payer: Self-pay | Admitting: Internal Medicine

## 2020-08-18 DIAGNOSIS — R911 Solitary pulmonary nodule: Secondary | ICD-10-CM

## 2020-08-28 ENCOUNTER — Ambulatory Visit: Payer: BC Managed Care – PPO | Attending: Internal Medicine

## 2020-08-28 DIAGNOSIS — Z23 Encounter for immunization: Secondary | ICD-10-CM

## 2020-08-28 NOTE — Progress Notes (Signed)
   Covid-19 Vaccination Clinic  Name:  Roger Guerra    MRN: [id number] DOB: October 09, 1955  08/28/2020  [name] was observed post Covid-19 immunization for 15 minutes without incident. He was provided with Vaccine Information Sheet and instruction to access the V-Safe system.   [name] was instructed to call 911 with any severe reactions post vaccine: Marland Kitchen Difficulty breathing  . Swelling of face and throat  . A fast heartbeat  . A bad rash all over body  . Dizziness and weakness   Immunizations Administered    No immunizations on file.

## 2020-09-03 ENCOUNTER — Other Ambulatory Visit: Payer: Self-pay

## 2020-09-03 ENCOUNTER — Ambulatory Visit (HOSPITAL_COMMUNITY)
Admission: RE | Admit: 2020-09-03 | Discharge: 2020-09-03 | Disposition: A | Discharge status: Home / Self Care | Payer: BC Managed Care – PPO | Source: Ambulatory Visit | Attending: Internal Medicine | Admitting: Internal Medicine

## 2020-09-03 DIAGNOSIS — R911 Solitary pulmonary nodule: Secondary | ICD-10-CM | POA: Diagnosis not present

## 2020-10-16 ENCOUNTER — Other Ambulatory Visit: Payer: Self-pay

## 2020-10-16 DIAGNOSIS — Z20822 Contact with and (suspected) exposure to covid-19: Secondary | ICD-10-CM

## 2020-10-18 LAB — SARS-COV-2, NAA 2 DAY TAT

## 2020-10-18 LAB — NOVEL CORONAVIRUS, NAA: SARS-CoV-2, NAA: NOT DETECTED

## 2020-10-23 ENCOUNTER — Other Ambulatory Visit: Payer: Self-pay

## 2020-10-23 DIAGNOSIS — Z20822 Contact with and (suspected) exposure to covid-19: Secondary | ICD-10-CM

## 2020-10-24 LAB — SARS-COV-2, NAA 2 DAY TAT

## 2020-10-24 LAB — NOVEL CORONAVIRUS, NAA: SARS-CoV-2, NAA: DETECTED — AB

## 2020-11-04 DIAGNOSIS — G5603 Carpal tunnel syndrome, bilateral upper limbs: Secondary | ICD-10-CM | POA: Insufficient documentation

## 2020-12-09 ENCOUNTER — Encounter: Payer: Self-pay | Admitting: Cardiology

## 2020-12-09 NOTE — Progress Notes (Unsigned)
Cardiology Office Note  Date: 12/10/2020   ID: Roger Guerra, DOB [birth year], MRN [id number]  PCP:  Celene Squibb, MD  Cardiologist:  Rozann Lesches, MD Electrophysiologist:  None   Chief Complaint  Patient presents with  . Shortness of Breath    History of Present Illness: Roger Guerra is a 65 y.o. male referred for cardiology consultation by Dr. Nevada Crane with coronary artery calcification by CT imaging, also shortness of breath.  He states that since last spring he has had dyspnea on exertion that has been progressive.  No chest tightness or pressure with activity.  He did have COVID-19 back in January of this year, feels like this has affected his breathing as well, but the initial symptoms were occurring prior to that.  Chest CT obtained in December 2021 did show coronary artery calcification, LAD and circumflex distribution, also aortic atherosclerosis.  Lung findings included groundglass opacities affecting the right upper lobe.  He does not describe any cough.  Last ischemic evaluation was via Myoview in 2016, overall low risk as outlined below.  I personally reviewed his ECG today which is normal.  Medications are outlined below. Recent LDL reported 127.  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  He does exercise, usually 3 days a week.  Enjoys swimming for 45 minutes at the [facility], sometimes does a weight workout.  States that he does not seem to be bothered by shortness of breath when he is swimming.  Also has trouble with arthritic right knee pain that has been progressive.  He feels like he is on track for a knee replacement.  He has gained weight over the last few years.  Past Medical History:  Diagnosis Date  . Anxiety disorder   . Coronary artery calcification seen on CT scan   . Depression   . Erectile dysfunction   . GERD (gastroesophageal reflux disease)   . History of DVT of lower extremity    Right leg 2002   . History of pneumonia    . Hyperlipidemia   . Hypogonadism in male   . Nerve root and plexus disorder   . Obesity   . Obstructive sleep apnea   . Sciatica   . Spondylosis   . Type 2 diabetes mellitus ([place])     Past Surgical History:  Procedure Laterality Date  . BLADDER SURGERY  2015   removal of skin tag near urethra  . COLONOSCOPY N/A 12/10/2016   Procedure: COLONOSCOPY;  Surgeon: Daneil Dolin, MD;  [facility];  Service: Endoscopy;  Laterality: N/A;  8:30 AM  . KNEE ARTHROSCOPY WITH MEDIAL MENISECTOMY Right 08/25/2017   Procedure: Right knee arthroscopy, partial medial menisectomy and debridement;  Surgeon: Susa Day, MD;  [facility];  Service: Orthopedics;  Laterality: Right;  60 mins  . POLYPECTOMY  12/10/2016   Procedure: POLYPECTOMY;  Surgeon: Daneil Dolin, MD;  [facility];  Service: Endoscopy;;  hepatic flexure  . TONSILLECTOMY      Current Outpatient Medications  Medication Sig Dispense Refill  . aspirin 81 MG tablet Take 81 mg by mouth daily.    . carisoprodol (SOMA) 350 MG tablet Take 350 mg 3 (three) times daily as needed by mouth for muscle spasms.    . cetirizine (ZYRTEC) 10 MG tablet Take 10 mg by mouth every evening.     Marland Kitchen co-enzyme Q-10 30 MG capsule Take 30 mg by mouth daily.    . fexofenadine (  ALLEGRA) 180 MG tablet Take 180 mg by mouth every morning.     Marland Kitchen ibuprofen (ADVIL) 800 MG tablet Take 800 mg by mouth in the morning and at bedtime.    . Insulin Glargine (BASAGLAR KWIKPEN [place]) Inject into the skin. 16 units twice daily    . metFORMIN (GLUCOPHAGE) 500 MG tablet Take 500 mg by mouth 2 (two) times daily with a meal.    . Multiple Vitamin (MULTIVITAMIN WITH MINERALS) TABS tablet Take 1 tablet by mouth daily.    . Omega-3 Fatty Acids (FISH OIL PO) Take 1 capsule by mouth daily.    Marland Kitchen omeprazole (PRILOSEC OTC) 20 MG tablet Take 20 mg by mouth daily as needed (acid reflux).    Marland Kitchen PRESCRIPTION MEDICATION Inject 1 Dose as directed as needed (erectile  dysfunction). Trimix Papaverine 30mg /ml Phentolamine 1mg /ml Prostaglandin 38mcg/ml    . sodium chloride (OCEAN) 0.65 % SOLN nasal spray Place 1 spray into both nostrils as needed for congestion.    Marland Kitchen testosterone cypionate (DEPOTESTOSTERONE CYPIONATE) 200 MG/ML injection Inject 200 mg into the muscle every 14 (fourteen) days.    . traMADol (ULTRAM) 50 MG tablet Take 50 mg by mouth every 6 (six) hours as needed.    . vitamin E 400 UNIT capsule Take 400 Units by mouth daily.     No current facility-administered medications for this visit.   Allergies:  Invokana [canagliflozin]   Social History: The patient  reports that he has quit smoking. His smoking use included cigarettes. He has never used smokeless tobacco. He reports that he does not drink alcohol and does not use drugs.   Family History: The patient's family history includes Diabetes in his mother, sister, and another family member; Heart disease in his father; Lung cancer in his father.   ROS: No palpitations or syncope.  Physical Exam: VS:  BP 130/78   Pulse 75   Ht 6' (1.829 m)   Wt 262 lb (118.8 kg)   SpO2 95%   BMI 35.53 kg/m , BMI Body mass index is 35.53 kg/m.  Wt Readings from Last 3 Encounters:  12/10/20 262 lb (118.8 kg)  08/25/17 242 lb (109.8 kg)  08/22/17 242 lb (109.8 kg)    General: Patient appears comfortable at rest. HEENT: Conjunctiva and lids normal, wearing a mask. Neck: Supple, no elevated JVP or carotid bruits, no thyromegaly. Lungs: Clear to auscultation, nonlabored breathing at rest. Cardiac: Regular rate and rhythm, no S3, soft basal systolic murmur, no pericardial rub. Abdomen: Soft, nontender, bowel sounds present. Extremities: No pitting edema, distal pulses 2+. Skin: Warm and dry. Musculoskeletal: No kyphosis. Neuropsychiatric: Alert and oriented x3, affect grossly appropriate.  ECG:  An ECG dated 08/22/2017 was personally reviewed today and demonstrated:  Sinus rhythm, Q waves in leads III  and aVF.  Recent Labwork:  January 2022: SARS coronavirus 2 test positive  Other Studies Reviewed Today:  Exercise Myoview 08/05/2015:  There was no ST segment deviation noted during stress. EKG interpretation somewhat limtied by stress EKG artifact.  No T wave inversion was noted during stress.  The study is normal. There are no perfusion defects consistent with prior infarction or current ishemia  This is a low risk study.  The left ventricular ejection fraction is normal (55-65%).  Duke treadmill score of 8 consistent with low risk for major cardiac events. Excellent exercuse functional capacity (120% of predicted based on age and gender)  Chest CT  09/03/2020: IMPRESSION: 1. Right middle lobe 3 mm pulmonary nodule  is stable since 06/30/2015 chest CT report and considered benign. 2. Mild patchy right upper lobe ground-glass opacities, nonspecific, most likely inflammatory, cannot exclude atypical/viral infection. Follow-up high-resolution chest CT study could be considered in 3-6 months as clinically warranted given the reported chronic respiratory symptoms. 3. Two-vessel coronary atherosclerosis. 4. Aortic Atherosclerosis (ICD10-I70.0).  Assessment and Plan:  1.  Progressive dyspnea on exertion in a 65 year old male with history of hyperlipidemia, OSA, type 2 diabetes mellitus, and coronary artery calcification by CT imaging.  Last ischemic work-up was in 2016, low risk at that time.  Follow-up ECG normal.  He is limited by right arthritic knee pain.  We discussed proceeding with an echocardiogram for cardiac structural assessment and also a [place] for repeat ischemic evaluation.  Given abnormal lung findings by chest CT in December 2021, may need to consider a high resolution chest CT if ischemic work-up is reassuring.  2.  Type 2 diabetes mellitus, on Glucophage and insulin with follow-up by Dr. Nevada Crane.  With coronary artery calcification by CT imaging, statin  therapy would also be indicated.  Medication Adjustments/Labs and Tests Ordered: Current medicines are reviewed at length with the patient today.  Concerns regarding medicines are outlined above.   Tests Ordered: Orders Placed This Encounter  Procedures  . NM Myocar Multi W/Spect W/Wall Motion / EF  . EKG 12-Lead  . ECHOCARDIOGRAM COMPLETE    Medication Changes: No orders of the defined types were placed in this encounter.   Disposition:  Follow up test results.  Signed, Satira Sark, MD, [facility] 12/10/2020 9:24 AM    [facility] [place], [place], [place] [address] Phone: [phone]; Fax: [phone]

## 2020-12-10 ENCOUNTER — Telehealth: Payer: Self-pay | Admitting: Cardiology

## 2020-12-10 ENCOUNTER — Encounter: Payer: Self-pay | Admitting: Cardiology

## 2020-12-10 ENCOUNTER — Encounter: Payer: Self-pay | Admitting: *Deleted

## 2020-12-10 ENCOUNTER — Ambulatory Visit: Payer: BC Managed Care – PPO | Admitting: Cardiology

## 2020-12-10 VITALS — BP 130/78 | HR 75 | Ht 72.0 in | Wt 262.0 lb

## 2020-12-10 DIAGNOSIS — R0600 Dyspnea, unspecified: Secondary | ICD-10-CM | POA: Diagnosis not present

## 2020-12-10 DIAGNOSIS — I2510 Atherosclerotic heart disease of native coronary artery without angina pectoris: Secondary | ICD-10-CM

## 2020-12-10 DIAGNOSIS — R0609 Other forms of dyspnea: Secondary | ICD-10-CM

## 2020-12-10 NOTE — Telephone Encounter (Signed)
Pre-cert Verification for the following procedure    1.LEXISCAN 2.ECHO   DATE:12/26/2020  LOCATION: [facility]

## 2020-12-10 NOTE — Patient Instructions (Addendum)
Medication Instructions:  Your physician recommends that you continue on your current medications as directed. Please refer to the Current Medication list given to you today.  Labwork: none  Testing/Procedures: Your physician has requested that you have an echocardiogram. Echocardiography is a painless test that uses sound waves to create images of your heart. It provides your doctor with information about the size and shape of your heart and how well your heart's chambers and valves are working. This procedure takes approximately one hour. There are no restrictions for this procedure. Your physician has requested that you have a lexiscan myoview. For further information please visit [email or web address]. Please follow instruction sheet, as given.  Follow-Up: Your physician recommends that you schedule a follow-up appointment in: pending  Any Other Special Instructions Will Be Listed Below (If Applicable).  If you need a refill on your cardiac medications before your next appointment, please call your pharmacy. 

## 2020-12-11 ENCOUNTER — Ambulatory Visit: Payer: BC Managed Care – PPO | Attending: Orthopedic Surgery | Admitting: Physical Therapy

## 2020-12-11 ENCOUNTER — Other Ambulatory Visit: Payer: Self-pay

## 2020-12-11 DIAGNOSIS — R293 Abnormal posture: Secondary | ICD-10-CM | POA: Diagnosis present

## 2020-12-11 DIAGNOSIS — M542 Cervicalgia: Secondary | ICD-10-CM | POA: Insufficient documentation

## 2020-12-11 DIAGNOSIS — M6281 Muscle weakness (generalized): Secondary | ICD-10-CM | POA: Diagnosis present

## 2020-12-11 NOTE — Therapy (Signed)
[facility] [place], [place], [address] Phone: [phone]   Fax:  [phone]  Physical Therapy Evaluation  Patient Details  Name: Roger Guerra MRN: [id number] Date of Birth: [birth year] Referring Provider (PT): Melina Schools MD   Encounter Date: 12/11/2020   PT End of Session - 12/11/20 1219    Visit Number 1    Number of Visits 12    Date for PT Re-Evaluation 01/08/21    Authorization Type FOTO AT LEAST EVERY 5TH VISIT.  PROGRESS NOTE AT 10TH VISIT.  KX MODIFIER AFTER 15 VISITS.    PT Start Time 1115    PT Stop Time 1156    PT Time Calculation (min) 41 min    Activity Tolerance Patient tolerated treatment well    Behavior During Therapy WFL for tasks assessed/performed           Past Medical History:  Diagnosis Date  . Anxiety disorder   . Coronary artery calcification seen on CT scan   . Depression   . Erectile dysfunction   . GERD (gastroesophageal reflux disease)   . History of DVT of lower extremity    Right leg 2002   . History of pneumonia   . Hyperlipidemia   . Hypogonadism in male   . Nerve root and plexus disorder   . Obesity   . Obstructive sleep apnea   . Sciatica   . Spondylosis   . Type 2 diabetes mellitus ([place])     Past Surgical History:  Procedure Laterality Date  . BLADDER SURGERY  2015   removal of skin tag near urethra  . COLONOSCOPY N/A 12/10/2016   Procedure: COLONOSCOPY;  Surgeon: Daneil Dolin, MD;  [facility];  Service: Endoscopy;  Laterality: N/A;  8:30 AM  . KNEE ARTHROSCOPY WITH MEDIAL MENISECTOMY Right 08/25/2017   Procedure: Right knee arthroscopy, partial medial menisectomy and debridement;  Surgeon: Susa Day, MD;  [facility];  Service: Orthopedics;  Laterality: Right;  60 mins  . POLYPECTOMY  12/10/2016   Procedure: POLYPECTOMY;  Surgeon: Daneil Dolin, MD;  [facility];  Service: Endoscopy;;  hepatic flexure  . TONSILLECTOMY       There were no vitals filed for this visit.    Subjective Assessment - 12/11/20 1207    Subjective COVID-19 screen performed prior to patient entering clinic.  The patient presents to the clinic today with c/o chronic neck pain over many years.  Approximately a year+ ago he pain has been increasing.  A recent injection has been very helpful.  He has also been diagnosed with bilateral CTS.  He wear cock-up splints at night which help.  He has been drropping objects.  He also has a good pillow.  He pain today is rated at a 3/10.  Ice, splints and a back pod help decrease pain.    Pertinent History Right knee ATS, spondylosis, H/o Sciatica, DM, h/o bilateral frozen shoulders, h/o left DVT, bilateral CTS.    Patient Stated Goals NCV, MRI, X-ray.    Currently in Pain? Yes    Pain Score 3     Pain Location Neck    Pain Orientation Left    Pain Descriptors / Indicators Aching;Sore;Numbness    Pain Type Chronic pain    Pain Radiating Towards Left side of neck.    Pain Onset More than a month ago    Pain Frequency Constant    Aggravating Factors  Increased activity.  Pain Relieving Factors See above.    Effect of Pain on Daily Activities Not able to do hobbies that involve using hand tools/hammers.              [facility] PT Assessment - 12/11/20 0001      Assessment   Medical Diagnosis Cervicalgia.    Referring Provider (PT) Melina Schools MD    Onset Date/Surgical Date --   Many years.     Precautions   Precautions None      Restrictions   Weight Bearing Restrictions No      Balance Screen   Has the patient fallen in the past 6 months No    Has the patient had a decrease in activity level because of a fear of falling?  No    Is the patient reluctant to leave their home because of a fear of falling?  No      Home Environment   Living Environment Private residence      Prior Function   Level of Independence Independent      Posture/Postural Control   Posture/Postural Control  Postural limitations    Postural Limitations Rounded Shoulders;Forward head      Deep Tendon Reflexes   DTR Assessment Site Biceps;Brachioradialis;Triceps    Biceps DTR 2+    Brachioradialis DTR 1+    Triceps DTR 0      ROM / Strength   AROM / PROM / Strength AROM;Strength      AROM   Overall AROM Comments Right active cervical rotation= 55 degrees and left= 50 degrees.      Strength   Overall Strength Comments Left deltoid= 4-/5, triceps= 4-/5, ER= 4-/5, IR= 4+/5.  Normal Bicep strength.  Grip (right hand dominant) is 35# on left and 40#.      Palpation   Palpation comment Some tenderness over left lower cervical musculature and left UT with some increase in tone.      Special Tests   Other special tests Cervical distraction test decrease pain with patient stating "felt good."      Ambulation/Gait   Gait Comments WNL.                      Objective measurements completed on examination: See above findings.       [facility] Adult PT Treatment/Exercise - 12/11/20 0001      Modalities   Modalities Traction      Traction   Type of Traction Cervical    Min (lbs) 5    Max (lbs) 12    Hold Time 99    Rest Time 5    Time 15                  PT Education - 12/11/20 1315    Education Details Chin tucks and cervical extension.    Person(s) Educated Patient    Methods Explanation               PT Long Term Goals - 12/11/20 1339      PT LONG TERM GOAL #1   Title Independent with a HEP.    Period Weeks    Status New      PT LONG TERM GOAL #2   Title Increase active cervical rotation to 65-70 degrees+ so patient can turn head more easily while driving.    Time 4    Period Weeks    Status New      PT LONG TERM GOAL #  3   Title Perform hobbies again.    Time 4    Period Weeks    Status New                  Plan - 12/11/20 1220    Clinical Impression Statement The patient presents to OPPT with c/o left sided neck pain.  He has a  very long h/o neck pain.  Additionally, he is being seen by an Orthopedist for bilateral CTS.  Night splints and an injection in the left wrist have helped decrease wrist discomfort.  He had an injection in his neck last week which was very helpful.  He was told chin tucks would also help.  His cervical range of motion is decreased and he exhibits weakness in his left UE.  The Cervical Distraction test decreased his pain.  Patient will benefit from skilled physical therapy intervention to address pain and deficits.    Personal Factors and Comorbidities Comorbidity 1;Comorbidity 2;Other    Comorbidities Right knee ATS, spondylosis, H/o Sciatica, DM, h/o bilateral frozen shoulders, h/o left DVT, bilateral CTS.    Examination-Activity Limitations Other    Examination-Participation Restrictions Other    Stability/Clinical Decision Making Stable/Uncomplicated    Clinical Decision Making Low    Rehab Potential Good    PT Frequency 3x / week    PT Duration 4 weeks    PT Treatment/Interventions ADLs/Self Care Home Management;Cryotherapy;Electrical Stimulation;Ultrasound;Traction;Moist Heat;Therapeutic activities;Therapeutic exercise;Manual techniques;Patient/family education;Passive range of motion;Dry needling    PT Next Visit Plan FOTO (already loaded).  Combo e'stim/US and STW/M to left cervical/UT musculature, chin tuck and extension, active cervical range of motion.  Progress cervical traction (14#-115# next visit.    Consulted and Agree with Plan of Care Patient           Patient will benefit from skilled therapeutic intervention in order to improve the following deficits and impairments:  Pain,Postural dysfunction,Increased muscle spasms,Decreased activity tolerance,Decreased strength,Decreased range of motion  Visit Diagnosis: Cervicalgia - Plan: PT plan of care cert/re-cert  Abnormal posture - Plan: PT plan of care cert/re-cert  Muscle weakness (generalized) - Plan: PT plan of care  cert/re-cert     Problem List Patient Active Problem List   Diagnosis Date Noted  . Male hypogonadism 07/24/2015  . Uncontrolled type 2 diabetes mellitus without complication, without long-term current use of insulin 07/24/2015  . Hyperlipidemia 07/24/2015    Ashlen Kiger, [place] MPT 12/11/2020, 1:43 PM  [facility] [address] [place], [place], [address] Phone: [phone]   Fax:  [phone]  Name: Roger Guerra MRN: [id number] Date of Birth: 1956/08/01

## 2020-12-15 ENCOUNTER — Other Ambulatory Visit: Payer: Self-pay

## 2020-12-15 ENCOUNTER — Encounter: Payer: Self-pay | Admitting: Physical Therapy

## 2020-12-15 ENCOUNTER — Ambulatory Visit: Payer: BC Managed Care – PPO | Admitting: Physical Therapy

## 2020-12-15 DIAGNOSIS — R293 Abnormal posture: Secondary | ICD-10-CM

## 2020-12-15 DIAGNOSIS — M542 Cervicalgia: Secondary | ICD-10-CM | POA: Diagnosis not present

## 2020-12-15 NOTE — Therapy (Signed)
[facility] [place], [place], [address] Phone: [phone]   Fax:  [phone]  Physical Therapy Treatment  Patient Details  Name: Roger Guerra MRN: [id number] Date of Birth: [birth year] Referring Provider (PT): Melina Schools MD   Encounter Date: 12/15/2020   PT End of Session - 12/15/20 0953    Visit Number 2    Number of Visits 12    Date for PT Re-Evaluation 01/08/21    Authorization Type FOTO AT LEAST EVERY 5TH VISIT.  PROGRESS NOTE AT 10TH VISIT.  KX MODIFIER AFTER 15 VISITS.    PT Start Time 0815    PT Stop Time 0905    PT Time Calculation (min) 50 min    Activity Tolerance Patient tolerated treatment well    Behavior During Therapy [facility] for tasks assessed/performed           Past Medical History:  Diagnosis Date  . Anxiety disorder   . Coronary artery calcification seen on CT scan   . Depression   . Erectile dysfunction   . GERD (gastroesophageal reflux disease)   . History of DVT of lower extremity    Right leg 2002   . History of pneumonia   . Hyperlipidemia   . Hypogonadism in male   . Nerve root and plexus disorder   . Obesity   . Obstructive sleep apnea   . Sciatica   . Spondylosis   . Type 2 diabetes mellitus ([place])     Past Surgical History:  Procedure Laterality Date  . BLADDER SURGERY  2015   removal of skin tag near urethra  . COLONOSCOPY N/A 12/10/2016   Procedure: COLONOSCOPY;  Surgeon: Daneil Dolin, MD;  [facility];  Service: Endoscopy;  Laterality: N/A;  8:30 AM  . KNEE ARTHROSCOPY WITH MEDIAL MENISECTOMY Right 08/25/2017   Procedure: Right knee arthroscopy, partial medial menisectomy and debridement;  Surgeon: Susa Day, MD;  [facility];  Service: Orthopedics;  Laterality: Right;  60 mins  . POLYPECTOMY  12/10/2016   Procedure: POLYPECTOMY;  Surgeon: Daneil Dolin, MD;  [facility];  Service: Endoscopy;;  hepatic flexure  . TONSILLECTOMY       There were no vitals filed for this visit.   Subjective Assessment - 12/15/20 0952    Subjective COVID-19 screen performed prior to patient entering clinic.  Sore.    Pertinent History Right knee ATS, spondylosis, H/o Sciatica, DM, h/o bilateral frozen shoulders, h/o left DVT, bilateral CTS.    Patient Stated Goals NCV, MRI, X-ray.    Currently in Pain? Yes    Pain Score 3     Pain Location Neck    Pain Orientation Right;Left    Pain Descriptors / Indicators Aching;Sore;Numbness    Pain Onset More than a month ago                             [facility] Adult PT Treatment/Exercise - 12/15/20 0001      Modalities   Modalities Moist Heat;Electrical Stimulation;Ultrasound      Moist Heat Therapy   Number Minutes Moist Heat 20 Minutes    Moist Heat Location Cervical      Electrical Stimulation   Electrical Stimulation Location Bilateral cervical musculature.    Electrical Stimulation Action IFC at 80-150 Hz    Electrical Stimulation Parameters 40% scan x 20 minutes.    Electrical Stimulation Goals Tone;Pain  Ultrasound   Ultrasound Location Bilateral cervical musculature.    Ultrasound Parameters Combo e'stim/US at 1.50 W/CM2 x 12 minutes.      Manual Therapy   Manual Therapy Soft tissue mobilization    Soft tissue mobilization STW/M x 12 minutes to patient's bilateral cervical musculature to reduce tone and pain.                       PT Long Term Goals - 12/11/20 1339      PT LONG TERM GOAL #1   Title Independent with a HEP.    Period Weeks    Status New      PT LONG TERM GOAL #2   Title Increase active cervical rotation to 65-70 degrees+ so patient can turn head more easily while driving.    Time 4    Period Weeks    Status New      PT LONG TERM GOAL #3   Title Perform hobbies again.    Time 4    Period Weeks    Status New                 Plan - 12/15/20 1137    Clinical Impression Statement Patient did well with  treatment today. Plan to focus on reducing cervical musculature tone and postural exercises.    Personal Factors and Comorbidities Comorbidity 1;Comorbidity 2;Other    Comorbidities Right knee ATS, spondylosis, H/o Sciatica, DM, h/o bilateral frozen shoulders, h/o left DVT, bilateral CTS.    Stability/Clinical Decision Making Stable/Uncomplicated    Rehab Potential Good    PT Frequency 3x / week    PT Treatment/Interventions ADLs/Self Care Home Management;Cryotherapy;Electrical Stimulation;Ultrasound;Traction;Moist Heat;Therapeutic activities;Therapeutic exercise;Manual techniques;Patient/family education;Passive range of motion;Dry needling    PT Next Visit Plan FOTO (already loaded).  Combo e'stim/US and STW/M to left cervical/UT musculature, chin tuck and extension, active cervical range of motion.  Progress cervical traction (14#-115# next visit.    Consulted and Agree with Plan of Care Patient           Patient will benefit from skilled therapeutic intervention in order to improve the following deficits and impairments:  Pain,Postural dysfunction,Increased muscle spasms,Decreased activity tolerance,Decreased strength,Decreased range of motion  Visit Diagnosis: Cervicalgia  Abnormal posture     Problem List Patient Active Problem List   Diagnosis Date Noted  . Male hypogonadism 07/24/2015  . Uncontrolled type 2 diabetes mellitus without complication, without long-term current use of insulin 07/24/2015  . Hyperlipidemia 07/24/2015    Gracieann Stannard, [place] MPT 12/15/2020, 11:40 AM  [facility] [address] [place], [place], [address] Phone: [phone]   Fax:  [phone]  Name: Roger Guerra MRN: [id number] Date of Birth: 17-Aug-1956

## 2020-12-17 ENCOUNTER — Ambulatory Visit: Payer: BC Managed Care – PPO | Admitting: Physical Therapy

## 2020-12-17 ENCOUNTER — Encounter: Payer: Self-pay | Admitting: Physical Therapy

## 2020-12-17 ENCOUNTER — Other Ambulatory Visit: Payer: Self-pay

## 2020-12-17 DIAGNOSIS — M542 Cervicalgia: Secondary | ICD-10-CM | POA: Diagnosis not present

## 2020-12-17 DIAGNOSIS — M6281 Muscle weakness (generalized): Secondary | ICD-10-CM

## 2020-12-17 DIAGNOSIS — R293 Abnormal posture: Secondary | ICD-10-CM

## 2020-12-17 NOTE — Therapy (Signed)
[facility] [place], [place], [address] Phone: [phone]   Fax:  [phone]  Physical Therapy Treatment  Patient Details  Name: Roger Guerra MRN: [id number] Date of Birth: 1956/09/03 Referring Provider (PT): Melina Schools MD   Encounter Date: 12/17/2020   PT End of Session - 12/17/20 1229    Visit Number 3    Number of Visits 12    Date for PT Re-Evaluation 01/08/21    Authorization Type FOTO AT LEAST EVERY 5TH VISIT.  PROGRESS NOTE AT 10TH VISIT.  KX MODIFIER AFTER 15 VISITS.    PT Start Time 0815    PT Stop Time 0910    PT Time Calculation (min) 55 min    Activity Tolerance Patient tolerated treatment well    Behavior During Therapy [facility] for tasks assessed/performed           Past Medical History:  Diagnosis Date  . Anxiety disorder   . Coronary artery calcification seen on CT scan   . Depression   . Erectile dysfunction   . GERD (gastroesophageal reflux disease)   . History of DVT of lower extremity    Right leg 2002   . History of pneumonia   . Hyperlipidemia   . Hypogonadism in male   . Nerve root and plexus disorder   . Obesity   . Obstructive sleep apnea   . Sciatica   . Spondylosis   . Type 2 diabetes mellitus ([place])     Past Surgical History:  Procedure Laterality Date  . BLADDER SURGERY  2015   removal of skin tag near urethra  . COLONOSCOPY N/A 12/10/2016   Procedure: COLONOSCOPY;  Surgeon: Daneil Dolin, MD;  [facility];  Service: Endoscopy;  Laterality: N/A;  8:30 AM  . KNEE ARTHROSCOPY WITH MEDIAL MENISECTOMY Right 08/25/2017   Procedure: Right knee arthroscopy, partial medial menisectomy and debridement;  Surgeon: Susa Day, MD;  Location: [facility];  Service: Orthopedics;  Laterality: Right;  60 mins  . POLYPECTOMY  12/10/2016   Procedure: POLYPECTOMY;  Surgeon: Daneil Dolin, MD;  [facility];  Service: Endoscopy;;  hepatic flexure  . TONSILLECTOMY       There were no vitals filed for this visit.   Subjective Assessment - 12/17/20 1211    Subjective COVID-19 screen performed prior to patient entering clinic.                             [facility] Adult PT Treatment/Exercise - 12/17/20 0001      Moist Heat Therapy   Number Minutes Moist Heat 20 Minutes    Moist Heat Location Cervical      Electrical Stimulation   Electrical Stimulation Location Bil cervical    Electrical Stimulation Action IFC at 80-150 Hz    Electrical Stimulation Parameters x 20 minutes.    Electrical Stimulation Goals Tone;Pain      Ultrasound   Ultrasound Location Bil Cervical musculature.    Ultrasound Parameters Combo e'stim/US at 1.50 W/CM2 x 12 minutes.      Manual Therapy   Manual Therapy Soft tissue mobilization    Soft tissue mobilization STW/M x 12 minutes to patient's bilateral cervical musculature.                       PT Long Term Goals - 12/11/20 1339      PT LONG TERM GOAL #1  Title Independent with a HEP.    Period Weeks    Status New      PT LONG TERM GOAL #2   Title Increase active cervical rotation to 65-70 degrees+ so patient can turn head more easily while driving.    Time 4    Period Weeks    Status New      PT LONG TERM GOAL #3   Title Perform hobbies again.    Time 4    Period Weeks    Status New                 Plan - 12/17/20 1220    Clinical Impression Statement Patient did very well today though symptoms are unchanged at this time.    Personal Factors and Comorbidities Comorbidity 1;Comorbidity 2;Other    Comorbidities Right knee ATS, spondylosis, H/o Sciatica, DM, h/o bilateral frozen shoulders, h/o left DVT, bilateral CTS.    Examination-Activity Limitations Other    Examination-Participation Restrictions Other    Stability/Clinical Decision Making Stable/Uncomplicated    Rehab Potential Good    PT Frequency 3x / week    PT Treatment/Interventions ADLs/Self Care Home  Management;Cryotherapy;Electrical Stimulation;Ultrasound;Traction;Moist Heat;Therapeutic activities;Therapeutic exercise;Manual techniques;Patient/family education;Passive range of motion;Dry needling    PT Next Visit Plan FOTO (already loaded).  Combo e'stim/US and STW/M to left cervical/UT musculature, chin tuck and extension, active cervical range of motion.  Progress cervical traction (14#-115# next visit.    Consulted and Agree with Plan of Care Patient           Patient will benefit from skilled therapeutic intervention in order to improve the following deficits and impairments:  Pain,Postural dysfunction,Increased muscle spasms,Decreased activity tolerance,Decreased strength,Decreased range of motion  Visit Diagnosis: Cervicalgia  Abnormal posture  Muscle weakness (generalized)     Problem List Patient Active Problem List   Diagnosis Date Noted  . Male hypogonadism 07/24/2015  . Uncontrolled type 2 diabetes mellitus without complication, without long-term current use of insulin 07/24/2015  . Hyperlipidemia 07/24/2015    Jourden Gilson, [place] MPT 12/17/2020, 12:31 PM  [facility] [address] [place], [place], [address] Phone: [phone]   Fax:  [phone]  Name: Deny Chevez MRN: [id number] Date of Birth: [birth year]

## 2020-12-22 ENCOUNTER — Other Ambulatory Visit: Payer: Self-pay

## 2020-12-22 ENCOUNTER — Ambulatory Visit: Payer: BC Managed Care – PPO | Admitting: Physical Therapy

## 2020-12-22 DIAGNOSIS — M542 Cervicalgia: Secondary | ICD-10-CM | POA: Diagnosis not present

## 2020-12-22 DIAGNOSIS — M6281 Muscle weakness (generalized): Secondary | ICD-10-CM

## 2020-12-22 DIAGNOSIS — R293 Abnormal posture: Secondary | ICD-10-CM

## 2020-12-22 NOTE — Therapy (Signed)
[facility] [place], [place], [address] Phone: [phone]   Fax:  [phone]  Physical Therapy Treatment  Patient Details  Name: Roger Guerra MRN: [id number] Date of Birth: November 02, 1955 Referring Provider (PT): Melina Schools MD   Encounter Date: 12/22/2020   PT End of Session - 12/22/20 0933    Visit Number 4    Number of Visits 12    Date for PT Re-Evaluation 01/08/21    Authorization Type FOTO AT LEAST EVERY 5TH VISIT.  PROGRESS NOTE AT 10TH VISIT.  KX MODIFIER AFTER 15 VISITS.    PT Start Time 0815    PT Stop Time 0911    PT Time Calculation (min) 56 min    Activity Tolerance Patient tolerated treatment well    Behavior During Therapy [facility] for tasks assessed/performed           Past Medical History:  Diagnosis Date  . Anxiety disorder   . Coronary artery calcification seen on CT scan   . Depression   . Erectile dysfunction   . GERD (gastroesophageal reflux disease)   . History of DVT of lower extremity    Right leg 2002   . History of pneumonia   . Hyperlipidemia   . Hypogonadism in male   . Nerve root and plexus disorder   . Obesity   . Obstructive sleep apnea   . Sciatica   . Spondylosis   . Type 2 diabetes mellitus ([place])     Past Surgical History:  Procedure Laterality Date  . BLADDER SURGERY  2015   removal of skin tag near urethra  . COLONOSCOPY N/A 12/10/2016   Procedure: COLONOSCOPY;  Surgeon: Daneil Dolin, MD;  [facility];  Service: Endoscopy;  Laterality: N/A;  8:30 AM  . KNEE ARTHROSCOPY WITH MEDIAL MENISECTOMY Right 08/25/2017   Procedure: Right knee arthroscopy, partial medial menisectomy and debridement;  Surgeon: Susa Day, MD;  [facility];  Service: Orthopedics;  Laterality: Right;  60 mins  . POLYPECTOMY  12/10/2016   Procedure: POLYPECTOMY;  Surgeon: Daneil Dolin, MD;  [facility];  Service: Endoscopy;;  hepatic flexure  . TONSILLECTOMY       There were no vitals filed for this visit.   Subjective Assessment - 12/22/20 0935    Subjective COVID-19 screen performed prior to patient entering clinic.  Sore.    Pertinent History Right knee ATS, spondylosis, H/o Sciatica, DM, h/o bilateral frozen shoulders, h/o left DVT, bilateral CTS.    Patient Stated Goals NCV, MRI, X-ray.    Currently in Pain? Yes    Pain Score 3     Pain Location Neck    Pain Orientation Right;Left    Pain Descriptors / Indicators Sore    Pain Type Chronic pain    Pain Onset More than a month ago                             [facility] Adult PT Treatment/Exercise - 12/22/20 0001      Modalities   Modalities Electrical Stimulation;Moist Heat;Ultrasound      Moist Heat Therapy   Number Minutes Moist Heat 20 Minutes      Electrical Stimulation   Electrical Stimulation Location Bil Cervical/Upper Thoracic.    Electrical Stimulation Action IFC at 80-150 Hz.    Electrical Stimulation Parameters 40% scan x 20 minutes.    Electrical Stimulation Goals Pain;Tone  Ultrasound   Ultrasound Location Bil Cervical.    Ultrasound Parameters U/S at 1.50 W/CM2 x 12 minutes.      Manual Therapy   Manual Therapy Soft tissue mobilization    Soft tissue mobilization STW/M x 12 minutes to patient's bilateral cervical and upper thoracic musculature.                       PT Long Term Goals - 12/11/20 1339      PT LONG TERM GOAL #1   Title Independent with a HEP.    Period Weeks    Status New      PT LONG TERM GOAL #2   Title Increase active cervical rotation to 65-70 degrees+ so patient can turn head more easily while driving.    Time 4    Period Weeks    Status New      PT LONG TERM GOAL #3   Title Perform hobbies again.    Time 4    Period Weeks    Status New                 Plan - 12/22/20 1100    Clinical Impression Statement Patient tolerated treatment well but not improving signifcantly at this point thus  far.  Pain over bilateral upper thoracic region noted as well.    Personal Factors and Comorbidities Comorbidity 1;Comorbidity 2;Other    Comorbidities Right knee ATS, spondylosis, H/o Sciatica, DM, h/o bilateral frozen shoulders, h/o left DVT, bilateral CTS.    Examination-Activity Limitations Other    Examination-Participation Restrictions Other    Stability/Clinical Decision Making Stable/Uncomplicated    Rehab Potential Good    PT Frequency 3x / week    PT Treatment/Interventions ADLs/Self Care Home Management;Cryotherapy;Electrical Stimulation;Ultrasound;Traction;Moist Heat;Therapeutic activities;Therapeutic exercise;Manual techniques;Patient/family education;Passive range of motion;Dry needling    PT Next Visit Plan FOTO (already loaded).  Combo e'stim/US and STW/M to left cervical/UT musculature, chin tuck and extension, active cervical range of motion.  Progress cervical traction (14#-115# next visit.    Consulted and Agree with Plan of Care Patient           Patient will benefit from skilled therapeutic intervention in order to improve the following deficits and impairments:  Pain,Postural dysfunction,Increased muscle spasms,Decreased activity tolerance,Decreased strength,Decreased range of motion  Visit Diagnosis: Cervicalgia  Abnormal posture  Muscle weakness (generalized)     Problem List Patient Active Problem List   Diagnosis Date Noted  . Male hypogonadism 07/24/2015  . Uncontrolled type 2 diabetes mellitus without complication, without long-term current use of insulin 07/24/2015  . Hyperlipidemia 07/24/2015    Talajah Slimp, [place] MPT 12/22/2020, 11:02 AM  [facility] [address] [place], [place], [address] Phone: [phone]   Fax:  [phone]  Name: Roger Guerra MRN: [id number] Date of Birth: 1956-05-04

## 2020-12-24 ENCOUNTER — Ambulatory Visit: Payer: BC Managed Care – PPO | Admitting: Physical Therapy

## 2020-12-24 ENCOUNTER — Other Ambulatory Visit: Payer: Self-pay

## 2020-12-24 DIAGNOSIS — M6281 Muscle weakness (generalized): Secondary | ICD-10-CM

## 2020-12-24 DIAGNOSIS — R293 Abnormal posture: Secondary | ICD-10-CM

## 2020-12-24 DIAGNOSIS — M542 Cervicalgia: Secondary | ICD-10-CM

## 2020-12-24 NOTE — Therapy (Signed)
[facility] [place], [place], [address] Phone: [phone]   Fax:  [phone]  Physical Therapy Treatment  Patient Details  Name: Roger Guerra MRN: [id number] Date of Birth: March 30, 1956 Referring Provider (PT): Melina Schools MD   Encounter Date: 12/24/2020   PT End of Session - 12/24/20 1250    Visit Number 5    Number of Visits 12    Date for PT Re-Evaluation 01/08/21    PT Start Time 0815    PT Stop Time 0909    PT Time Calculation (min) 54 min    Activity Tolerance Patient tolerated treatment well    Behavior During Therapy [facility] for tasks assessed/performed           Past Medical History:  Diagnosis Date  . Anxiety disorder   . Coronary artery calcification seen on CT scan   . Depression   . Erectile dysfunction   . GERD (gastroesophageal reflux disease)   . History of DVT of lower extremity    Right leg 2002   . History of pneumonia   . Hyperlipidemia   . Hypogonadism in male   . Nerve root and plexus disorder   . Obesity   . Obstructive sleep apnea   . Sciatica   . Spondylosis   . Type 2 diabetes mellitus ([place])     Past Surgical History:  Procedure Laterality Date  . BLADDER SURGERY  2015   removal of skin tag near urethra  . COLONOSCOPY N/A 12/10/2016   Procedure: COLONOSCOPY;  Surgeon: Daneil Dolin, MD;  [facility];  Service: Endoscopy;  Laterality: N/A;  8:30 AM  . KNEE ARTHROSCOPY WITH MEDIAL MENISECTOMY Right 08/25/2017   Procedure: Right knee arthroscopy, partial medial menisectomy and debridement;  Surgeon: Susa Day, MD;  [facility];  Service: Orthopedics;  Laterality: Right;  60 mins  . POLYPECTOMY  12/10/2016   Procedure: POLYPECTOMY;  Surgeon: Daneil Dolin, MD;  [facility];  Service: Endoscopy;;  hepatic flexure  . TONSILLECTOMY      There were no vitals filed for this visit.   Subjective Assessment - 12/24/20 1248    Subjective COVID-19  screen performed prior to patient entering clinic.  No new complaints.    Pertinent History Right knee ATS, spondylosis, H/o Sciatica, DM, h/o bilateral frozen shoulders, h/o left DVT, bilateral CTS.    Patient Stated Goals NCV, MRI, X-ray.    Currently in Pain? Yes    Pain Score 3     Pain Location Neck    Pain Orientation Right;Left    Pain Descriptors / Indicators Sore    Pain Type Chronic pain    Pain Onset More than a month ago                             [facility] Adult PT Treatment/Exercise - 12/24/20 0001      Modalities   Modalities Electrical Stimulation;Moist Heat;Ultrasound      Moist Heat Therapy   Number Minutes Moist Heat 20 Minutes      Electrical Stimulation   Electrical Stimulation Location Bil cervical    Electrical Stimulation Action IFC at 80-150 Hz    Electrical Stimulation Parameters 40% scan x 20 minutes.    Electrical Stimulation Goals Pain;Tone      Ultrasound   Ultrasound Location Bil cervical musculature.    Ultrasound Parameters Combo e'stim/US at 1.50 W/CM2 x 12 minutes.  Manual Therapy   Manual Therapy Soft tissue mobilization    Soft tissue mobilization In supine;  STW/M x 12 minutes to patient's bilateral cervical musculature.  Also including gentle manual raction and cervical range of motion.                       PT Long Term Goals - 12/11/20 1339      PT LONG TERM GOAL #1   Title Independent with a HEP.    Period Weeks    Status New      PT LONG TERM GOAL #2   Title Increase active cervical rotation to 65-70 degrees+ so patient can turn head more easily while driving.    Time 4    Period Weeks    Status New      PT LONG TERM GOAL #3   Title Perform hobbies again.    Time 4    Period Weeks    Status New                 Plan - 12/24/20 1259    Clinical Impression Statement Patient did well today with gentle cervical manual traction in supine today.    Personal Factors and Comorbidities  Comorbidity 1;Comorbidity 2;Other    Comorbidities Right knee ATS, spondylosis, H/o Sciatica, DM, h/o bilateral frozen shoulders, h/o left DVT, bilateral CTS.    Examination-Activity Limitations Other    Examination-Participation Restrictions Other    Stability/Clinical Decision Making Stable/Uncomplicated    Rehab Potential Good    PT Duration 4 weeks    PT Treatment/Interventions ADLs/Self Care Home Management;Cryotherapy;Electrical Stimulation;Ultrasound;Traction;Moist Heat;Therapeutic activities;Therapeutic exercise;Manual techniques;Patient/family education;Passive range of motion;Dry needling           Patient will benefit from skilled therapeutic intervention in order to improve the following deficits and impairments:  Pain,Postural dysfunction,Increased muscle spasms,Decreased activity tolerance,Decreased strength,Decreased range of motion  Visit Diagnosis: Cervicalgia  Abnormal posture  Muscle weakness (generalized)     Problem List Patient Active Problem List   Diagnosis Date Noted  . Male hypogonadism 07/24/2015  . Uncontrolled type 2 diabetes mellitus without complication, without long-term current use of insulin 07/24/2015  . Hyperlipidemia 07/24/2015    Sandar Krinke, [place]  MPT 12/24/2020, 1:01 PM  [facility] [address] [place], [place], [address] Phone: [phone]   Fax:  [phone]  Name: Magnus Crescenzo MRN: [id number] Date of Birth: 08-18-1956

## 2020-12-26 ENCOUNTER — Encounter (HOSPITAL_COMMUNITY): Payer: Self-pay

## 2020-12-26 ENCOUNTER — Ambulatory Visit (HOSPITAL_BASED_OUTPATIENT_CLINIC_OR_DEPARTMENT_OTHER)
Admission: RE | Admit: 2020-12-26 | Discharge: 2020-12-26 | Disposition: A | Discharge status: Home / Self Care | Payer: BC Managed Care – PPO | Source: Ambulatory Visit | Attending: Cardiology | Admitting: Cardiology

## 2020-12-26 ENCOUNTER — Encounter (HOSPITAL_COMMUNITY)
Admission: RE | Admit: 2020-12-26 | Discharge: 2020-12-26 | Disposition: A | Discharge status: Home / Self Care | Payer: BC Managed Care – PPO | Source: Ambulatory Visit | Attending: Cardiology | Admitting: Cardiology

## 2020-12-26 ENCOUNTER — Ambulatory Visit (HOSPITAL_COMMUNITY)
Admission: RE | Admit: 2020-12-26 | Discharge: 2020-12-26 | Disposition: A | Discharge status: Home / Self Care | Payer: BC Managed Care – PPO | Source: Ambulatory Visit | Attending: Cardiology | Admitting: Cardiology

## 2020-12-26 ENCOUNTER — Other Ambulatory Visit: Payer: Self-pay

## 2020-12-26 DIAGNOSIS — I2510 Atherosclerotic heart disease of native coronary artery without angina pectoris: Secondary | ICD-10-CM

## 2020-12-26 DIAGNOSIS — R0609 Other forms of dyspnea: Secondary | ICD-10-CM

## 2020-12-26 DIAGNOSIS — R0600 Dyspnea, unspecified: Secondary | ICD-10-CM | POA: Insufficient documentation

## 2020-12-26 LAB — NM MYOCAR MULTI W/SPECT W/WALL MOTION / EF
LV dias vol: 135 mL (ref 62–150)
LV sys vol: 53 mL
Peak HR: 103 {beats}/min
RATE: 0.37
Rest HR: 64 {beats}/min
SDS: 3
SRS: 1
SSS: 4
TID: 1.33

## 2020-12-26 LAB — ECHOCARDIOGRAM COMPLETE
AR max vel: 3.03 cm2
AV Area VTI: 2.91 cm2
AV Area mean vel: 2.82 cm2
AV Mean grad: 3.1 mmHg
AV Peak grad: 6.1 mmHg
Ao pk vel: 1.23 m/s
Area-P 1/2: 3.66 cm2
S' Lateral: 3.65 cm

## 2020-12-26 MED ORDER — TECHNETIUM TC 99M TETROFOSMIN IV KIT
30.0000 | PACK | Freq: Once | INTRAVENOUS | Status: AC | PRN
  Administered 2020-12-26: 33 via INTRAVENOUS

## 2020-12-26 MED ORDER — SODIUM CHLORIDE FLUSH 0.9 % IV SOLN
INTRAVENOUS | Status: AC
  Administered 2020-12-26: 10 mL via INTRAVENOUS
  Filled 2020-12-26: qty 10

## 2020-12-26 MED ORDER — TECHNETIUM TC 99M TETROFOSMIN IV KIT
10.0000 | PACK | Freq: Once | INTRAVENOUS | Status: AC | PRN
  Administered 2020-12-26: 10.7 via INTRAVENOUS

## 2020-12-26 MED ORDER — REGADENOSON 0.4 MG/5ML IV SOLN
INTRAVENOUS | Status: AC
  Administered 2020-12-26: 0.4 mg via INTRAVENOUS
  Filled 2020-12-26: qty 5

## 2020-12-26 NOTE — Progress Notes (Signed)
  Echocardiogram 2D Echocardiogram has been performed.  Roger Guerra 12/26/2020, 1:03 PM

## 2020-12-29 ENCOUNTER — Ambulatory Visit: Payer: BC Managed Care – PPO | Attending: Orthopedic Surgery | Admitting: Physical Therapy

## 2020-12-29 ENCOUNTER — Other Ambulatory Visit: Payer: Self-pay

## 2020-12-29 DIAGNOSIS — M6281 Muscle weakness (generalized): Secondary | ICD-10-CM | POA: Diagnosis present

## 2020-12-29 DIAGNOSIS — M542 Cervicalgia: Secondary | ICD-10-CM | POA: Diagnosis present

## 2020-12-29 DIAGNOSIS — R293 Abnormal posture: Secondary | ICD-10-CM | POA: Insufficient documentation

## 2020-12-29 NOTE — Therapy (Signed)
[facility] [place], [place], [address] Phone: [phone]   Fax:  [phone]  Physical Therapy Treatment  Patient Details  Name: Roger Guerra MRN: [id number] Date of Birth: 14-Oct-1955 Referring Provider (PT): Melina Schools MD   Encounter Date: 12/29/2020   PT End of Session - 12/29/20 1428    Visit Number 6    Number of Visits 12    Date for PT Re-Evaluation 01/08/21    Authorization Type FOTO AT LEAST EVERY 5TH VISIT.  PROGRESS NOTE AT 10TH VISIT.  KX MODIFIER AFTER 15 VISITS.    PT Start Time 0145    PT Stop Time 0238    PT Time Calculation (min) 53 min    Activity Tolerance Patient tolerated treatment well    Behavior During Therapy [facility] for tasks assessed/performed           Past Medical History:  Diagnosis Date  . Anxiety disorder   . Coronary artery calcification seen on CT scan   . Depression   . Erectile dysfunction   . GERD (gastroesophageal reflux disease)   . History of DVT of lower extremity    Right leg 2002   . History of pneumonia   . Hyperlipidemia   . Hypogonadism in male   . Nerve root and plexus disorder   . Obesity   . Obstructive sleep apnea   . Sciatica   . Spondylosis   . Type 2 diabetes mellitus ([place])     Past Surgical History:  Procedure Laterality Date  . BLADDER SURGERY  2015   removal of skin tag near urethra  . COLONOSCOPY N/A 12/10/2016   Procedure: COLONOSCOPY;  Surgeon: Daneil Dolin, MD;  [facility];  Service: Endoscopy;  Laterality: N/A;  8:30 AM  . KNEE ARTHROSCOPY WITH MEDIAL MENISECTOMY Right 08/25/2017   Procedure: Right knee arthroscopy, partial medial menisectomy and debridement;  Surgeon: Susa Day, MD;  [facility];  Service: Orthopedics;  Laterality: Right;  60 mins  . POLYPECTOMY  12/10/2016   Procedure: POLYPECTOMY;  Surgeon: Daneil Dolin, MD;  [facility];  Service: Endoscopy;;  hepatic flexure  . TONSILLECTOMY       There were no vitals filed for this visit.   Subjective Assessment - 12/29/20 1426    Subjective COVID-19 screen performed prior to patient entering clinic.  Numbness down about 50%.  CC is pain on left    Pertinent History Right knee ATS, spondylosis, H/o Sciatica, DM, h/o bilateral frozen shoulders, h/o left DVT, bilateral CTS.    Patient Stated Goals NCV, MRI, X-ray.    Currently in Pain? Yes    Pain Score 3     Pain Location Neck    Pain Orientation Left    Pain Descriptors / Indicators Sore    Pain Type Chronic pain    Pain Onset More than a month ago                             [facility] Adult PT Treatment/Exercise - 12/29/20 0001      Modalities   Modalities Electrical Stimulation;Moist Heat;Ultrasound      Moist Heat Therapy   Number Minutes Moist Heat 20 Minutes      Electrical Stimulation   Electrical Stimulation Location Left cervical.    Electrical Stimulation Action IFC at 80-150 Hz.    Electrical Stimulation Parameters 40% scan x 20 minutes.  Electrical Stimulation Goals Tone;Pain      Ultrasound   Ultrasound Location Left UT/lower cervical/upper thoracic.    Ultrasound Parameters Combo e'stim/US at 1.50 W/CM2 x 12 minutes.      Manual Therapy   Manual Therapy Soft tissue mobilization    Soft tissue mobilization Gentle PA mobs to upper thoracic/left upper costovertebral joints and supine cervical manual traction and STW/M to affected cervical musculature x 12 minutes.                       PT Long Term Goals - 12/11/20 1339      PT LONG TERM GOAL #1   Title Independent with a HEP.    Period Weeks    Status New      PT LONG TERM GOAL #2   Title Increase active cervical rotation to 65-70 degrees+ so patient can turn head more easily while driving.    Time 4    Period Weeks    Status New      PT LONG TERM GOAL #3   Title Perform hobbies again.    Time 4    Period Weeks    Status New                 Plan  - 12/29/20 1434    Clinical Impression Statement Patient responded well to treatment today.  His left upper thoracic region was hurting today.  He reports a 50% reduction in numbness thus far.    Personal Factors and Comorbidities Comorbidity 1;Comorbidity 2;Other    Comorbidities Right knee ATS, spondylosis, H/o Sciatica, DM, h/o bilateral frozen shoulders, h/o left DVT, bilateral CTS.    Examination-Activity Limitations Other    Stability/Clinical Decision Making Stable/Uncomplicated    Rehab Potential Good    PT Frequency 3x / week    PT Duration 4 weeks    PT Treatment/Interventions ADLs/Self Care Home Management;Cryotherapy;Electrical Stimulation;Ultrasound;Traction;Moist Heat;Therapeutic activities;Therapeutic exercise;Manual techniques;Patient/family education;Passive range of motion;Dry needling    PT Next Visit Plan FOTO (already loaded).  Combo e'stim/US and STW/M to left cervical/UT musculature, chin tuck and extension, active cervical range of motion.  Progress cervical traction (14#-115# next visit.    Consulted and Agree with Plan of Care Patient           Patient will benefit from skilled therapeutic intervention in order to improve the following deficits and impairments:  Pain,Postural dysfunction,Increased muscle spasms,Decreased activity tolerance,Decreased strength,Decreased range of motion  Visit Diagnosis: Cervicalgia  Abnormal posture  Muscle weakness (generalized)     Problem List Patient Active Problem List   Diagnosis Date Noted  . Male hypogonadism 07/24/2015  . Uncontrolled type 2 diabetes mellitus without complication, without long-term current use of insulin 07/24/2015  . Hyperlipidemia 07/24/2015    Roger Guerra, Roger Guerra 12/29/2020, 2:50 PM  [facility] [address] [place], [place], [address] Phone: [phone]   Fax:  [phone]  Name: Roger Guerra MRN: [id number] Date of Birth: [birth year]

## 2020-12-31 ENCOUNTER — Telehealth: Payer: Self-pay | Admitting: *Deleted

## 2020-12-31 ENCOUNTER — Ambulatory Visit: Payer: BC Managed Care – PPO | Admitting: Physical Therapy

## 2020-12-31 ENCOUNTER — Other Ambulatory Visit: Payer: Self-pay

## 2020-12-31 DIAGNOSIS — M6281 Muscle weakness (generalized): Secondary | ICD-10-CM

## 2020-12-31 DIAGNOSIS — R0602 Shortness of breath: Secondary | ICD-10-CM

## 2020-12-31 DIAGNOSIS — M542 Cervicalgia: Secondary | ICD-10-CM

## 2020-12-31 DIAGNOSIS — R293 Abnormal posture: Secondary | ICD-10-CM

## 2020-12-31 NOTE — Therapy (Signed)
[facility] [place], [place], [address] Phone: [phone]   Fax:  [phone]  Physical Therapy Treatment  Patient Details  Name: Roger Guerra MRN: [id number] Date of Birth: Apr 23, 1956 Referring Provider (PT): Melina Schools MD   Encounter Date: 12/31/2020   PT End of Session - 12/31/20 1109    Visit Number 7    Number of Visits 12    Date for PT Re-Evaluation 01/08/21    Authorization Type FOTO AT LEAST EVERY 5TH VISIT.  PROGRESS NOTE AT 10TH VISIT.  KX MODIFIER AFTER 15 VISITS.    PT Start Time 0945    PT Stop Time 1042    PT Time Calculation (min) 57 min    Behavior During Therapy WFL for tasks assessed/performed           Past Medical History:  Diagnosis Date  . Anxiety disorder   . Coronary artery calcification seen on CT scan   . Depression   . Erectile dysfunction   . GERD (gastroesophageal reflux disease)   . History of DVT of lower extremity    Right leg 2002   . History of pneumonia   . Hyperlipidemia   . Hypogonadism in male   . Nerve root and plexus disorder   . Obesity   . Obstructive sleep apnea   . Sciatica   . Spondylosis   . Type 2 diabetes mellitus ([place])     Past Surgical History:  Procedure Laterality Date  . BLADDER SURGERY  2015   removal of skin tag near urethra  . COLONOSCOPY N/A 12/10/2016   Procedure: COLONOSCOPY;  Surgeon: Daneil Dolin, MD;  [facility];  Service: Endoscopy;  Laterality: N/A;  8:30 AM  . KNEE ARTHROSCOPY WITH MEDIAL MENISECTOMY Right 08/25/2017   Procedure: Right knee arthroscopy, partial medial menisectomy and debridement;  Surgeon: Susa Day, MD;  [facility];  Service: Orthopedics;  Laterality: Right;  60 mins  . POLYPECTOMY  12/10/2016   Procedure: POLYPECTOMY;  Surgeon: Daneil Dolin, MD;  [facility];  Service: Endoscopy;;  hepatic flexure  . TONSILLECTOMY      There were no vitals filed for this  visit.                      [facility] Adult PT Treatment/Exercise - 12/31/20 0001      Modalities   Modalities Electrical Stimulation;Moist Heat;Ultrasound      Moist Heat Therapy   Number Minutes Moist Heat 20 Minutes      Electrical Stimulation   Electrical Stimulation Location Bilateral cervical paraspinal musculature.    Electrical Stimulation Action Pre-mod.    Electrical Stimulation Parameters 80-150 Hz (5 sec on and 5 sec off) x 20 minutes      Ultrasound   Ultrasound Location Left upper thoracic.    Ultrasound Parameters Combo e'stim/US at 1.50 W/CM2 x 12 minutes.      Manual Therapy   Manual Therapy Soft tissue mobilization    Soft tissue mobilization In supine:  STW/M x 12 minutes to patient's affected cervical region.                       PT Long Term Goals - 12/11/20 1339      PT LONG TERM GOAL #1   Title Independent with a HEP.    Period Weeks    Status New      PT LONG TERM GOAL #2  Title Increase active cervical rotation to 65-70 degrees+ so patient can turn head more easily while driving.    Time 4    Period Weeks    Status New      PT LONG TERM GOAL #3   Title Perform hobbies again.    Time 4    Period Weeks    Status New                 Plan - 12/31/20 1113    Clinical Impression Statement Good response to treatments.  Patient states his upper thoracic feels better and the cervical extension exercise is getting easier.    Personal Factors and Comorbidities Comorbidity 1;Comorbidity 2;Other    Comorbidities Right knee ATS, spondylosis, H/o Sciatica, DM, h/o bilateral frozen shoulders, h/o left DVT, bilateral CTS.    Examination-Activity Limitations Other    Examination-Participation Restrictions Other    Rehab Potential Good    PT Frequency 3x / week    PT Duration 4 weeks    PT Treatment/Interventions ADLs/Self Care Home Management;Cryotherapy;Electrical Stimulation;Ultrasound;Traction;Moist Heat;Therapeutic  activities;Therapeutic exercise;Manual techniques;Patient/family education;Passive range of motion;Dry needling    PT Next Visit Plan FOTO (already loaded).  Combo e'stim/US and STW/M to left cervical/UT musculature, chin tuck and extension, active cervical range of motion.  Progress cervical traction (14#-115# next visit.    Consulted and Agree with Plan of Care Patient           Patient will benefit from skilled therapeutic intervention in order to improve the following deficits and impairments:  Pain,Postural dysfunction,Increased muscle spasms,Decreased activity tolerance,Decreased strength,Decreased range of motion  Visit Diagnosis: Cervicalgia  Abnormal posture  Muscle weakness (generalized)     Problem List Patient Active Problem List   Diagnosis Date Noted  . Male hypogonadism 07/24/2015  . Uncontrolled type 2 diabetes mellitus without complication, without long-term current use of insulin 07/24/2015  . Hyperlipidemia 07/24/2015    Kathelene Rumberger, [place] MPT 12/31/2020, 11:17 AM  [facility] [address] [place], [place], [address] Phone: [phone]   Fax:  [phone]  Name: Roger Guerra MRN: [id number] Date of Birth: 07-14-1956

## 2020-12-31 NOTE — Telephone Encounter (Signed)
-----   Message from Satira Sark, MD sent at 12/28/2020  8:35 PM EDT ----- Results reviewed.  Please let him know that the stress test was reassuring, no ischemic territories to suggest obstructive CAD as cause of his symptoms.  it would be useful to go ahead and proceed with a high resolution chest CT in light of his shortness of breath to follow-up on the findings from his prior chest CT in December 2021.

## 2020-12-31 NOTE — Telephone Encounter (Signed)
Patient informed and verbalized understanding of plan. Copy sent to PCP 

## 2020-12-31 NOTE — Telephone Encounter (Signed)
-----   Message from Satira Sark, MD sent at 12/28/2020  8:35 PM EDT ----- Results reviewed.  Cardiac function is normal with LVEF 60 to 65%, also normal estimated pulmonary artery systolic pressure and no major valvular abnormality to explain symptoms.

## 2021-01-05 ENCOUNTER — Ambulatory Visit: Payer: BC Managed Care – PPO | Admitting: Physical Therapy

## 2021-01-05 ENCOUNTER — Other Ambulatory Visit: Payer: Self-pay

## 2021-01-05 DIAGNOSIS — M542 Cervicalgia: Secondary | ICD-10-CM | POA: Diagnosis not present

## 2021-01-05 DIAGNOSIS — R293 Abnormal posture: Secondary | ICD-10-CM

## 2021-01-05 DIAGNOSIS — M6281 Muscle weakness (generalized): Secondary | ICD-10-CM

## 2021-01-05 NOTE — Therapy (Signed)
[facility] [place], [place], [address] Phone: [phone]   Fax:  [phone]  Physical Therapy Treatment  Patient Details  Name: Roger Guerra MRN: [id number] Date of Birth: 12-25-1955 Referring Provider (PT): Melina Schools MD   Encounter Date: 01/05/2021   PT End of Session - 01/05/21 1103    Visit Number 8    Number of Visits 12    Date for PT Re-Evaluation 01/08/21    Authorization Type FOTO AT LEAST EVERY 5TH VISIT.  PROGRESS NOTE AT 10TH VISIT.  KX MODIFIER AFTER 15 VISITS.    PT Start Time 0945    PT Stop Time 1037    PT Time Calculation (min) 52 min    Activity Tolerance Patient tolerated treatment well    Behavior During Therapy WFL for tasks assessed/performed           Past Medical History:  Diagnosis Date  . Anxiety disorder   . Coronary artery calcification seen on CT scan   . Depression   . Erectile dysfunction   . GERD (gastroesophageal reflux disease)   . History of DVT of lower extremity    Right leg 2002   . History of pneumonia   . Hyperlipidemia   . Hypogonadism in male   . Nerve root and plexus disorder   . Obesity   . Obstructive sleep apnea   . Sciatica   . Spondylosis   . Type 2 diabetes mellitus ([place])     Past Surgical History:  Procedure Laterality Date  . BLADDER SURGERY  2015   removal of skin tag near urethra  . COLONOSCOPY N/A 12/10/2016   Procedure: COLONOSCOPY;  Surgeon: Daneil Dolin, MD;  [facility];  Service: Endoscopy;  Laterality: N/A;  8:30 AM  . KNEE ARTHROSCOPY WITH MEDIAL MENISECTOMY Right 08/25/2017   Procedure: Right knee arthroscopy, partial medial menisectomy and debridement;  Surgeon: Susa Day, MD;  [facility];  Service: Orthopedics;  Laterality: Right;  60 mins  . POLYPECTOMY  12/10/2016   Procedure: POLYPECTOMY;  Surgeon: Daneil Dolin, MD;  [facility];  Service: Endoscopy;;  hepatic flexure  . TONSILLECTOMY       There were no vitals filed for this visit.   Subjective Assessment - 01/05/21 1025    Subjective COVID-19 screen performed prior to patient entering clinic.  Doing good today.  Didn't do too much over the weekend.    Pertinent History Right knee ATS, spondylosis, H/o Sciatica, DM, h/o bilateral frozen shoulders, h/o left DVT, bilateral CTS.    Patient Stated Goals NCV, MRI, X-ray.    Currently in Pain? Yes    Pain Score 2     Pain Location Neck    Pain Orientation Left    Pain Descriptors / Indicators Sore    Pain Onset More than a month ago                             [facility] Adult PT Treatment/Exercise - 01/05/21 0001      Modalities   Modalities Electrical Stimulation;Moist Heat;Ultrasound      Moist Heat Therapy   Number Minutes Moist Heat 20 Minutes    Moist Heat Location Cervical      Electrical Stimulation   Electrical Stimulation Location Affected cervical.    Electrical Stimulation Action IFC at 80-150 Hz x 20 minutes.    Electrical Stimulation Goals Pain;Tone  Ultrasound   Ultrasound Location Left cervical/UT    Ultrasound Parameters Combo e'stim/US at 1.50 W/CM2 x 12 minutes.      Manual Therapy   Manual Therapy Soft tissue mobilization    Soft tissue mobilization Seated.  STW/M to patient's left cervical/upper thoracic region x 12 minutes.                       PT Long Term Goals - 12/11/20 1339      PT LONG TERM GOAL #1   Title Independent with a HEP.    Period Weeks    Status New      PT LONG TERM GOAL #2   Title Increase active cervical rotation to 65-70 degrees+ so patient can turn head more easily while driving.    Time 4    Period Weeks    Status New      PT LONG TERM GOAL #3   Title Perform hobbies again.    Time 4    Period Weeks    Status New                 Plan - 01/05/21 1101    Clinical Impression Statement The patient is responding well to treatments with a consistent reduction in pain  and numbness symptoms.    Personal Factors and Comorbidities Comorbidity 1;Comorbidity 2;Other    Examination-Activity Limitations Other    Stability/Clinical Decision Making Stable/Uncomplicated    Rehab Potential Good    PT Frequency 3x / week    PT Duration 4 weeks    PT Treatment/Interventions ADLs/Self Care Home Management;Cryotherapy;Electrical Stimulation;Ultrasound;Traction;Moist Heat;Therapeutic activities;Therapeutic exercise;Manual techniques;Patient/family education;Passive range of motion;Dry needling    PT Next Visit Plan FOTO (already loaded).  Combo e'stim/US and STW/M to left cervical/UT musculature, chin tuck and extension, active cervical range of motion.  Progress cervical traction (14#-115# next visit.    Consulted and Agree with Plan of Care Patient           Patient will benefit from skilled therapeutic intervention in order to improve the following deficits and impairments:  Pain,Postural dysfunction,Increased muscle spasms,Decreased activity tolerance,Decreased strength,Decreased range of motion  Visit Diagnosis: Abnormal posture  Cervicalgia  Muscle weakness (generalized)     Problem List Patient Active Problem List   Diagnosis Date Noted  . Male hypogonadism 07/24/2015  . Uncontrolled type 2 diabetes mellitus without complication, without long-term current use of insulin 07/24/2015  . Hyperlipidemia 07/24/2015    Pauline Trainer, [place]  MPT 01/05/2021, 11:04 AM  [facility] [address] [place], [place], [address] Phone: [phone]   Fax:  [phone]  Name: Roger Guerra MRN: [id number] Date of Birth: 1956-03-29

## 2021-01-07 ENCOUNTER — Encounter: Payer: Self-pay | Admitting: *Deleted

## 2021-01-07 ENCOUNTER — Telehealth: Payer: Self-pay | Admitting: Cardiology

## 2021-01-07 NOTE — Telephone Encounter (Signed)
Pre-cert Verification for the following procedure    Ct chest high resolution  DATE:   02/06/2021  LOCATION: [facility]

## 2021-01-12 ENCOUNTER — Other Ambulatory Visit: Payer: Self-pay

## 2021-01-12 ENCOUNTER — Ambulatory Visit: Payer: BC Managed Care – PPO | Admitting: Physical Therapy

## 2021-01-12 DIAGNOSIS — M542 Cervicalgia: Secondary | ICD-10-CM | POA: Diagnosis not present

## 2021-01-12 DIAGNOSIS — M6281 Muscle weakness (generalized): Secondary | ICD-10-CM

## 2021-01-12 DIAGNOSIS — R293 Abnormal posture: Secondary | ICD-10-CM

## 2021-01-12 NOTE — Therapy (Signed)
[facility] [place], [place], [address] Phone: [phone]   Fax:  [phone]  Physical Therapy Treatment  Patient Details  Name: Roger Guerra MRN: [id number] Date of Birth: 1956-09-14 Referring Provider (PT): Roger Schools MD   Encounter Date: 01/12/2021   PT End of Session - 01/12/21 1054    Visit Number 9    Number of Visits 12    Date for PT Re-Evaluation 01/08/21    Authorization Type FOTO AT LEAST EVERY 5TH VISIT.  PROGRESS NOTE AT 10TH VISIT.  KX MODIFIER AFTER 15 VISITS.    PT Start Time 0945    PT Stop Time 1040    PT Time Calculation (min) 55 min    Behavior During Therapy WFL for tasks assessed/performed           Past Medical History:  Diagnosis Date  . Anxiety disorder   . Coronary artery calcification seen on CT scan   . Depression   . Erectile dysfunction   . GERD (gastroesophageal reflux disease)   . History of DVT of lower extremity    Right leg 2002   . History of pneumonia   . Hyperlipidemia   . Hypogonadism in male   . Nerve root and plexus disorder   . Obesity   . Obstructive sleep apnea   . Sciatica   . Spondylosis   . Type 2 diabetes mellitus ([place])     Past Surgical History:  Procedure Laterality Date  . BLADDER SURGERY  2015   removal of skin tag near urethra  . COLONOSCOPY N/A 12/10/2016   Procedure: COLONOSCOPY;  Surgeon: Daneil Dolin, MD;  [facility];  Service: Endoscopy;  Laterality: N/A;  8:30 AM  . KNEE ARTHROSCOPY WITH MEDIAL MENISECTOMY Right 08/25/2017   Procedure: Right knee arthroscopy, partial medial menisectomy and debridement;  Surgeon: Susa Day, MD;  [facility];  Service: Orthopedics;  Laterality: Right;  60 mins  . POLYPECTOMY  12/10/2016   Procedure: POLYPECTOMY;  Surgeon: Daneil Dolin, MD;  [facility];  Service: Endoscopy;;  hepatic flexure  . TONSILLECTOMY      There were no vitals filed for this visit.    Subjective Assessment - 01/12/21 1052    Subjective COVID-19 screen performed prior to patient entering clinic.  Pain up to a 5 over the weekend.  CC is mid to upper cervical.    Pertinent History Right knee ATS, spondylosis, H/o Sciatica, DM, h/o bilateral frozen shoulders, h/o left DVT, bilateral CTS.    Patient Stated Goals NCV, MRI, X-ray.    Currently in Pain? Yes    Pain Score 5     Pain Orientation Left    Pain Descriptors / Indicators Sore    Pain Type Chronic pain    Pain Onset More than a month ago                             [facility] Adult PT Treatment/Exercise - 01/12/21 0001      Modalities   Modalities Electrical Stimulation;Moist Heat      Moist Heat Therapy   Number Minutes Moist Heat 20 Minutes    Moist Heat Location --   Left cervical.     Electrical Stimulation   Electrical Stimulation Location Left cervical.    Electrical Stimulation Action IFC at 80-150 Hz on 40% scan x 20 minutes.    Electrical Stimulation Goals Pain;Tone  Ultrasound   Ultrasound Location Left mid to upper cervical.    Ultrasound Parameters Small soundehead combo e'stim/US at 1.50 W/CM2 x 12 minutes.      Manual Therapy   Manual Therapy Soft tissue mobilization    Soft tissue mobilization In supine:  STW/M to patient's left mid to upper cervical musculature incldung gentle manual traction x 12 minutes.                       PT Long Term Goals - 12/11/20 1339      PT LONG TERM GOAL #1   Title Independent with a HEP.    Period Weeks    Status New      PT LONG TERM GOAL #2   Title Increase active cervical rotation to 65-70 degrees+ so patient can turn head more easily while driving.    Time 4    Period Weeks    Status New      PT LONG TERM GOAL #3   Title Perform hobbies again.    Time 4    Period Weeks    Status New                 Plan - 01/12/21 1057    Clinical Impression Statement Patient with flare-up over the weekend.  CC is  his left mid to upper cervical region.  He tolerated tratment very wel today without complaint.    Personal Factors and Comorbidities Comorbidity 1;Comorbidity 2;Other    Stability/Clinical Decision Making Stable/Uncomplicated    Rehab Potential Good    PT Frequency 3x / week    PT Duration 4 weeks    PT Treatment/Interventions ADLs/Self Care Home Management;Cryotherapy;Electrical Stimulation;Ultrasound;Traction;Moist Heat;Therapeutic activities;Therapeutic exercise;Manual techniques;Patient/family education;Passive range of motion;Dry needling    PT Next Visit Plan FOTO (already loaded).  Combo e'stim/US and STW/M to left cervical/UT musculature, chin tuck and extension, active cervical range of motion.  Progress cervical traction (14#-115# next visit.    Consulted and Agree with Plan of Care Patient           Patient will benefit from skilled therapeutic intervention in order to improve the following deficits and impairments:  Pain,Postural dysfunction,Increased muscle spasms,Decreased activity tolerance,Decreased strength,Decreased range of motion  Visit Diagnosis: Abnormal posture  Cervicalgia  Muscle weakness (generalized)     Problem List Patient Active Problem List   Diagnosis Date Noted  . Male hypogonadism 07/24/2015  . Uncontrolled type 2 diabetes mellitus without complication, without long-term current use of insulin 07/24/2015  . Hyperlipidemia 07/24/2015    Yarelli Decelles, [place] MPT 01/12/2021, 10:59 AM  [facility] [address] [place], [place], [address] Phone: [phone]   Fax:  [phone]  Name: Roger Guerra MRN: [id number] Date of Birth: Jan 09, 1956

## 2021-01-14 ENCOUNTER — Other Ambulatory Visit: Payer: Self-pay

## 2021-01-14 ENCOUNTER — Ambulatory Visit: Payer: BC Managed Care – PPO | Admitting: Physical Therapy

## 2021-01-14 DIAGNOSIS — M542 Cervicalgia: Secondary | ICD-10-CM

## 2021-01-14 DIAGNOSIS — R293 Abnormal posture: Secondary | ICD-10-CM

## 2021-01-14 NOTE — Therapy (Addendum)
[facility] [place], [place], [address] Phone: [phone]   Fax:  [phone]  Physical Therapy Treatment  Patient Details  Name: Roger Guerra MRN: [id number] Date of Birth: Sep 26, 1956 Referring Provider (PT): Melina Schools MD   Encounter Date: 01/14/2021   PT End of Session - 01/14/21 1041    Visit Number 10    Number of Visits 12    Date for PT Re-Evaluation 01/22/21    Authorization Type FOTO AT LEAST EVERY 5TH VISIT.  PROGRESS NOTE AT 10TH VISIT.  KX MODIFIER AFTER 15 VISITS.    PT Start Time 0945    PT Stop Time 1042    PT Time Calculation (min) 57 min    Activity Tolerance Patient tolerated treatment well    Behavior During Therapy WFL for tasks assessed/performed           Past Medical History:  Diagnosis Date  . Anxiety disorder   . Coronary artery calcification seen on CT scan   . Depression   . Erectile dysfunction   . GERD (gastroesophageal reflux disease)   . History of DVT of lower extremity    Right leg 2002   . History of pneumonia   . Hyperlipidemia   . Hypogonadism in male   . Nerve root and plexus disorder   . Obesity   . Obstructive sleep apnea   . Sciatica   . Spondylosis   . Type 2 diabetes mellitus ([place])     Past Surgical History:  Procedure Laterality Date  . BLADDER SURGERY  2015   removal of skin tag near urethra  . COLONOSCOPY N/A 12/10/2016   Procedure: COLONOSCOPY;  Surgeon: Daneil Dolin, MD;  [facility];  Service: Endoscopy;  Laterality: N/A;  8:30 AM  . KNEE ARTHROSCOPY WITH MEDIAL MENISECTOMY Right 08/25/2017   Procedure: Right knee arthroscopy, partial medial menisectomy and debridement;  Surgeon: Susa Day, MD;  [facility];  Service: Orthopedics;  Laterality: Right;  60 mins  . POLYPECTOMY  12/10/2016   Procedure: POLYPECTOMY;  Surgeon: Daneil Dolin, MD;  [facility];  Service: Endoscopy;;  hepatic flexure  . TONSILLECTOMY       There were no vitals filed for this visit.   Subjective Assessment - 01/14/21 1040    Subjective COVID-19 screen performed prior to patient entering clinic.  Better, but have rib pain.    Pertinent History Right knee ATS, spondylosis, H/o Sciatica, DM, h/o bilateral frozen shoulders, h/o left DVT, bilateral CTS.    Currently in Pain? Yes    Pain Score 4     Pain Location Neck    Pain Orientation Left    Pain Descriptors / Indicators Sore    Pain Type Chronic pain    Pain Onset More than a month ago                             [facility] Adult PT Treatment/Exercise - 01/14/21 0001      Modalities   Modalities Electrical Stimulation;Ultrasound;Moist Heat      Moist Heat Therapy   Number Minutes Moist Heat 20 Minutes    Moist Heat Location --   Left cervical.     Electrical Stimulation   Electrical Stimulation Location LT cervical.    Electrical Stimulation Action Pre-mod (5 sec on and 5 sec off) x 20 minutes at 80-150 Hz.    Electrical Stimulation Goals Pain;Tone  Ultrasound   Ultrasound Location Left mid to uper cervica.    Ultrasound Parameters Small soundhead combo e'stim/US at 1.50 W/CM2 x 12 minutes.      Manual Therapy   Manual Therapy Soft tissue mobilization    Soft tissue mobilization In prone:  STW/M x 13 minutes to patient's left cervical region and gentle thoracic PA mobs.                       PT Long Term Goals - 12/11/20 1339      PT LONG TERM GOAL #1   Title Independent with a HEP.    Period Weeks    Status New      PT LONG TERM GOAL #2   Title Increase active cervical rotation to 65-70 degrees+ so patient can turn head more easily while driving.    Time 4    Period Weeks    Status New      PT LONG TERM GOAL #3   Title Perform hobbies again.    Time 4    Period Weeks    Status New                  Patient will benefit from skilled therapeutic intervention in order to improve the following deficits  and impairments:     Visit Diagnosis: Abnormal posture  Cervicalgia     Problem List Patient Active Problem List   Diagnosis Date Noted  . Male hypogonadism 07/24/2015  . Uncontrolled type 2 diabetes mellitus without complication, without long-term current use of insulin 07/24/2015  . Hyperlipidemia 07/24/2015   Progress Note Reporting Period 12/11/20 to 01/14/21.  See note below for Objective Data and Assessment of Progress/Goals. Patient progressing well with a consistent decrease in pain and UE symptoms.     [name], [place] MPT 01/14/2021, 10:54 AM  [facility] [address] [place], [place], [address] Phone: [phone]   Fax:  [phone]  Name: Roger Guerra MRN: [id number] Date of Birth: Nov 10, 1955

## 2021-01-14 NOTE — Addendum Note (Signed)
Addended by: Cory Rama, [place] W on: 01/14/2021 11:00 AM   Modules accepted: Orders

## 2021-01-16 ENCOUNTER — Telehealth: Payer: Self-pay | Admitting: *Deleted

## 2021-01-16 DIAGNOSIS — E7800 Pure hypercholesterolemia, unspecified: Secondary | ICD-10-CM

## 2021-01-16 DIAGNOSIS — E7849 Other hyperlipidemia: Secondary | ICD-10-CM

## 2021-01-16 NOTE — Telephone Encounter (Signed)
Laurine Blazer, LPN  6/57/9038 3:33 PM EDT Back to Top     Patient notified and verbalized understanding. Stated that he has tried 3 statins in the past - 2 that he can remember (Crestor & Pravastatin). Was never able to tolerate due to muscle aches and cramps so severe that it would cause him to jump out of bed. States that his LDL has always been elevated, even when he was younger and skinnier. He is willing to try evaluation by the Lipid clinic though. Order entered into epic & pcc made aware.

## 2021-01-16 NOTE — Telephone Encounter (Signed)
-----   Message from Verta Ellen., NP sent at 01/15/2021 11:04 AM EDT ----- Recent lab work from PCP shows his LDL is elevated at 157.  Call him and ask him if he is willing to start a statin medication.  If so start him on Lipitor 10 mg daily.  Thanks

## 2021-01-23 ENCOUNTER — Ambulatory Visit: Payer: BC Managed Care – PPO | Admitting: *Deleted

## 2021-01-23 ENCOUNTER — Other Ambulatory Visit: Payer: Self-pay

## 2021-01-23 DIAGNOSIS — R293 Abnormal posture: Secondary | ICD-10-CM

## 2021-01-23 DIAGNOSIS — M6281 Muscle weakness (generalized): Secondary | ICD-10-CM

## 2021-01-23 DIAGNOSIS — M542 Cervicalgia: Secondary | ICD-10-CM

## 2021-01-23 NOTE — Therapy (Signed)
[facility] [place], [place], [address] Phone: [phone]   Fax:  [phone]  Physical Therapy Treatment  Patient Details  Name: Roger Guerra MRN: [id number] Date of Birth: 1956/02/10 Referring Provider (PT): Melina Schools MD   Encounter Date: 01/23/2021   PT End of Session - 01/23/21 1208    Visit Number 11    Number of Visits 12    Date for PT Re-Evaluation 01/22/21    Authorization Type FOTO AT LEAST EVERY 5TH VISIT.  PROGRESS NOTE AT 10TH VISIT.  KX MODIFIER AFTER 15 VISITS.    PT Start Time 1115    PT Stop Time 1205    PT Time Calculation (min) 50 min           Past Medical History:  Diagnosis Date  . Anxiety disorder   . Coronary artery calcification seen on CT scan   . Depression   . Erectile dysfunction   . GERD (gastroesophageal reflux disease)   . History of DVT of lower extremity    Right leg 2002   . History of pneumonia   . Hyperlipidemia   . Hypogonadism in male   . Nerve root and plexus disorder   . Obesity   . Obstructive sleep apnea   . Sciatica   . Spondylosis   . Type 2 diabetes mellitus ([place])     Past Surgical History:  Procedure Laterality Date  . BLADDER SURGERY  2015   removal of skin tag near urethra  . COLONOSCOPY N/A 12/10/2016   Procedure: COLONOSCOPY;  Surgeon: Daneil Dolin, MD;  [facility];  Service: Endoscopy;  Laterality: N/A;  8:30 AM  . KNEE ARTHROSCOPY WITH MEDIAL MENISECTOMY Right 08/25/2017   Procedure: Right knee arthroscopy, partial medial menisectomy and debridement;  Surgeon: Susa Day, MD;  Location: [facility];  Service: Orthopedics;  Laterality: Right;  60 mins  . POLYPECTOMY  12/10/2016   Procedure: POLYPECTOMY;  Surgeon: Daneil Dolin, MD;  Location: [facility];  Service: Endoscopy;;  hepatic flexure  . TONSILLECTOMY      There were no vitals filed for this visit.   Subjective Assessment - 01/23/21 1117    Subjective COVID-19  screen performed prior to patient entering clinic.  Did ok after last Rx. Pain 2/10    Pertinent History Right knee ATS, spondylosis, H/o Sciatica, DM, h/o bilateral frozen shoulders, h/o left DVT, bilateral CTS.    Patient Stated Goals NCV, MRI, X-ray.    Currently in Pain? Yes    Pain Score 2     Pain Location Neck    Pain Orientation Left    Pain Descriptors / Indicators Sore    Pain Type Chronic pain                             [facility] Adult PT Treatment/Exercise - 01/23/21 0001      Modalities   Modalities Electrical Stimulation;Ultrasound;Moist Heat      Moist Heat Therapy   Number Minutes Moist Heat 15 Minutes    Moist Heat Location Cervical      Electrical Stimulation   Electrical Stimulation Location LT cervical.    Electrical Stimulation Action IFC    Electrical Stimulation Parameters 80-150hz  x 15 mins    Electrical Stimulation Goals Pain;Tone      Ultrasound   Ultrasound Location Bil. cervical paras and UTs    Ultrasound Parameters Combo 1.5 w/cm2 x 12  mins    Ultrasound Goals Pain      Manual Therapy   Manual Therapy Soft tissue mobilization    Soft tissue mobilization STW to Bil cerv paras and UTs seated position                       PT Long Term Goals - 01/23/21 1209      PT LONG TERM GOAL #1   Title Independent with a HEP.    Time 4    Period Weeks    Status On-going      PT LONG TERM GOAL #2   Title Increase active cervical rotation to 65-70 degrees+ so patient can turn head more easily while driving.    Baseline 5/10 with standing beyond 5 min    Time 4    Period Weeks    Status On-going      PT LONG TERM GOAL #3   Title Perform hobbies again.    Baseline able to walk at a quick pace without problem.    Time 4    Period Weeks    Status On-going      PT LONG TERM GOAL #4   Title Eliminate left LE symptoms.    Baseline significantly improved    Period Weeks    Status On-going                 Plan  - 01/23/21 1210    Clinical Impression Statement Pt arrived today doing , but reports having to drive for a prolonged period yesterday and his neck is stiff. Pt did well with Rx and reports some decrease in tightness    Personal Factors and Comorbidities Comorbidity 1;Comorbidity 2;Other    Comorbidities Right knee ATS, spondylosis, H/o Sciatica, DM, h/o bilateral frozen shoulders, h/o left DVT, bilateral CTS.    Stability/Clinical Decision Making Stable/Uncomplicated    Rehab Potential Good    PT Frequency 3x / week    PT Duration 4 weeks    PT Treatment/Interventions ADLs/Self Care Home Management;Cryotherapy;Electrical Stimulation;Ultrasound;Traction;Moist Heat;Therapeutic activities;Therapeutic exercise;Manual techniques;Patient/family education;Passive range of motion;Dry needling    PT Next Visit Plan Combo e'stim/US and STW/M to left cervical/UT musculature, chin tuck and extension, active cervical range of motion.  next visit.    Consulted and Agree with Plan of Care Patient           Patient will benefit from skilled therapeutic intervention in order to improve the following deficits and impairments:  Pain,Postural dysfunction,Increased muscle spasms,Decreased activity tolerance,Decreased strength,Decreased range of motion  Visit Diagnosis: Abnormal posture  Cervicalgia  Muscle weakness (generalized)     Problem List Patient Active Problem List   Diagnosis Date Noted  . Male hypogonadism 07/24/2015  . Uncontrolled type 2 diabetes mellitus without complication, without long-term current use of insulin 07/24/2015  . Hyperlipidemia 07/24/2015    Roger Guerra,CHRIS, PTA 01/23/2021, 12:18 PM  [facility] [address] [place], [place], [address] Phone: [phone]   Fax:  [phone]  Name: Roger Guerra MRN: [id number] Date of Birth: 10-19-1955

## 2021-01-26 ENCOUNTER — Ambulatory Visit: Payer: BC Managed Care – PPO | Attending: Orthopedic Surgery | Admitting: Physical Therapy

## 2021-01-26 ENCOUNTER — Other Ambulatory Visit: Payer: Self-pay

## 2021-01-26 DIAGNOSIS — M542 Cervicalgia: Secondary | ICD-10-CM | POA: Insufficient documentation

## 2021-01-26 DIAGNOSIS — M6281 Muscle weakness (generalized): Secondary | ICD-10-CM | POA: Diagnosis present

## 2021-01-26 DIAGNOSIS — R293 Abnormal posture: Secondary | ICD-10-CM | POA: Insufficient documentation

## 2021-01-26 NOTE — Therapy (Signed)
[facility] [place], [place], [address] Phone: [phone]   Fax:  [phone]  Physical Therapy Treatment  Patient Details  Name: Roger Guerra MRN: [id number] Date of Birth: [birth year] Referring Provider (PT): Melina Schools MD   Encounter Date: 01/26/2021   PT End of Session - 01/26/21 1344    Visit Number 12    Number of Visits 12    Date for PT Re-Evaluation 01/22/21    Authorization Type FOTO AT LEAST EVERY 5TH VISIT.  PROGRESS NOTE AT 10TH VISIT.  KX MODIFIER AFTER 15 VISITS.    PT Start Time 0104    PT Stop Time 0200    PT Time Calculation (min) 56 min    Activity Tolerance Patient tolerated treatment well    Behavior During Therapy WFL for tasks assessed/performed           Past Medical History:  Diagnosis Date  . Anxiety disorder   . Coronary artery calcification seen on CT scan   . Depression   . Erectile dysfunction   . GERD (gastroesophageal reflux disease)   . History of DVT of lower extremity    Right leg 2002   . History of pneumonia   . Hyperlipidemia   . Hypogonadism in male   . Nerve root and plexus disorder   . Obesity   . Obstructive sleep apnea   . Sciatica   . Spondylosis   . Type 2 diabetes mellitus ([place])     Past Surgical History:  Procedure Laterality Date  . BLADDER SURGERY  2015   removal of skin tag near urethra  . COLONOSCOPY N/A 12/10/2016   Procedure: COLONOSCOPY;  Surgeon: Daneil Dolin, MD;  [facility];  Service: Endoscopy;  Laterality: N/A;  8:30 AM  . KNEE ARTHROSCOPY WITH MEDIAL MENISECTOMY Right 08/25/2017   Procedure: Right knee arthroscopy, partial medial menisectomy and debridement;  Surgeon: Susa Day, MD;  Location: [facility];  Service: Orthopedics;  Laterality: Right;  60 mins  . POLYPECTOMY  12/10/2016   Procedure: POLYPECTOMY;  Surgeon: Daneil Dolin, MD;  [facility]: [facility];  Service: Endoscopy;;  hepatic flexure  . TONSILLECTOMY       There were no vitals filed for this visit.   Subjective Assessment - 01/26/21 1339    Subjective COVID-19 screen performed prior to patient entering clinic.  Neck pain about a 3.    Pertinent History Right knee ATS, spondylosis, H/o Sciatica, DM, h/o bilateral frozen shoulders, h/o left DVT, bilateral CTS.    Patient Stated Goals NCV, MRI, X-ray.    Currently in Pain? Yes    Pain Score 3     Pain Location Neck    Pain Onset More than a month ago                             [facility] Adult PT Treatment/Exercise - 01/26/21 0001      Modalities   Modalities Electrical Stimulation;Moist Heat;Ultrasound      Moist Heat Therapy   Number Minutes Moist Heat 20 Minutes    Moist Heat Location Cervical      Electrical Stimulation   Electrical Stimulation Location Affected cervical    Electrical Stimulation Action IFC    Electrical Stimulation Parameters 80-150 Hz at 40% scan x 20 minutes.      Ultrasound   Ultrasound Location Bil cervical    Ultrasound Parameters Combo e'stim/US at 1.50 W/CM2  x 12 minutes.      Manual Therapy   Manual Therapy Soft tissue mobilization    Soft tissue mobilization STW/M x 11 minutes to patientbilateral cervical musculature.                       PT Long Term Goals - 01/26/21 1338      PT LONG TERM GOAL #1   Title Independent with a HEP.    Time 4    Period Weeks    Status Achieved      PT LONG TERM GOAL #2   Title LT= 60 and RT= 65 degrees.    Time 4    Period Weeks    Status Partially Met      PT LONG TERM GOAL #3   Title Perform hobbies again.    Time 4    Period Weeks    Status Partially Met      PT LONG TERM GOAL #4   Title Eliminate left LE symptoms.    Baseline significantly improved    Time 4    Period Weeks    Status Partially Met                 Plan - 01/26/21 1343    Clinical Impression Statement Please see "Therapy Note" section.    Personal Factors and Comorbidities Comorbidity  1;Comorbidity 2;Other    Comorbidities Right knee ATS, spondylosis, H/o Sciatica, DM, h/o bilateral frozen shoulders, h/o left DVT, bilateral CTS.    Examination-Activity Limitations Other    Examination-Participation Restrictions Other    Stability/Clinical Decision Making Stable/Uncomplicated    Rehab Potential Good    PT Frequency 3x / week    PT Duration 4 weeks    PT Treatment/Interventions ADLs/Self Care Home Management;Cryotherapy;Electrical Stimulation;Ultrasound;Traction;Moist Heat;Therapeutic activities;Therapeutic exercise;Manual techniques;Patient/family education;Passive range of motion;Dry needling    PT Next Visit Plan Combo e'stim/US and STW/M to left cervical/UT musculature, chin tuck and extension, active cervical range of motion.  next visit.    Consulted and Agree with Plan of Care Patient           Patient will benefit from skilled therapeutic intervention in order to improve the following deficits and impairments:  Pain,Postural dysfunction,Increased muscle spasms,Decreased activity tolerance,Decreased strength,Decreased range of motion  Visit Diagnosis: Abnormal posture  Cervicalgia  Muscle weakness (generalized)     Problem List Patient Active Problem List   Diagnosis Date Noted  . Male hypogonadism 07/24/2015  . Uncontrolled type 2 diabetes mellitus without complication, without long-term current use of insulin 07/24/2015  . Hyperlipidemia 07/24/2015   PHYSICAL THERAPY DISCHARGE SUMMARY  Visits from Start of Care: 12.  Current functional level related to goals / functional outcomes: Overall patient did well with PT with improvement though he continues to have neck pain.     Remaining deficits: See goal section above.   Education / Equipment: HEP. Plan: Patient agrees to discharge.  Patient goals were partially met. Patient is being discharged due to the physician's request.  ?????      [place] Whedbee, [place] MPT 01/26/2021, 2:17 PM  [facility] [place], [place], [address] Phone: [phone]   Fax:  [phone]  Name: Roger Guerra MRN: [id number] Date of Birth: September 10, 1956

## 2021-02-06 ENCOUNTER — Ambulatory Visit (HOSPITAL_COMMUNITY)
Admission: RE | Admit: 2021-02-06 | Discharge: 2021-02-06 | Disposition: A | Discharge status: Home / Self Care | Payer: BC Managed Care – PPO | Source: Ambulatory Visit | Attending: Cardiology | Admitting: Cardiology

## 2021-02-06 ENCOUNTER — Other Ambulatory Visit: Payer: Self-pay

## 2021-02-06 DIAGNOSIS — R0602 Shortness of breath: Secondary | ICD-10-CM | POA: Diagnosis not present

## 2021-02-11 ENCOUNTER — Telehealth: Payer: Self-pay | Admitting: *Deleted

## 2021-02-11 NOTE — Telephone Encounter (Signed)
-----   Message from Satira Sark, MD sent at 02/09/2021 12:28 PM EDT ----- Results reviewed.  High resolution chest CT shows no evidence of interstitial lung disease and clearing of previous inflammatory changes.  Small right middle lobe stable as before.  Overall reassuring findings.

## 2021-02-11 NOTE — Telephone Encounter (Signed)
Patient informed. Copy sent to PCP °

## 2021-02-17 ENCOUNTER — Encounter (INDEPENDENT_AMBULATORY_CARE_PROVIDER_SITE_OTHER): Payer: Self-pay | Admitting: Family Medicine

## 2021-02-17 ENCOUNTER — Other Ambulatory Visit: Payer: Self-pay

## 2021-02-17 ENCOUNTER — Ambulatory Visit (INDEPENDENT_AMBULATORY_CARE_PROVIDER_SITE_OTHER): Payer: BC Managed Care – PPO | Admitting: Family Medicine

## 2021-02-17 VITALS — BP 112/70 | HR 76 | Temp 97.9°F | Ht 71.0 in | Wt 259.0 lb

## 2021-02-17 DIAGNOSIS — E119 Type 2 diabetes mellitus without complications: Secondary | ICD-10-CM | POA: Insufficient documentation

## 2021-02-17 DIAGNOSIS — R0602 Shortness of breath: Secondary | ICD-10-CM

## 2021-02-17 DIAGNOSIS — F39 Unspecified mood [affective] disorder: Secondary | ICD-10-CM

## 2021-02-17 DIAGNOSIS — F3289 Other specified depressive episodes: Secondary | ICD-10-CM

## 2021-02-17 DIAGNOSIS — Z9189 Other specified personal risk factors, not elsewhere classified: Secondary | ICD-10-CM

## 2021-02-17 DIAGNOSIS — E559 Vitamin D deficiency, unspecified: Secondary | ICD-10-CM

## 2021-02-17 DIAGNOSIS — E1169 Type 2 diabetes mellitus with other specified complication: Secondary | ICD-10-CM

## 2021-02-17 DIAGNOSIS — E7849 Other hyperlipidemia: Secondary | ICD-10-CM

## 2021-02-17 DIAGNOSIS — G4733 Obstructive sleep apnea (adult) (pediatric): Secondary | ICD-10-CM

## 2021-02-17 DIAGNOSIS — Z6836 Body mass index (BMI) 36.0-36.9, adult: Secondary | ICD-10-CM

## 2021-02-17 DIAGNOSIS — R5383 Other fatigue: Secondary | ICD-10-CM | POA: Diagnosis not present

## 2021-02-17 DIAGNOSIS — Z9989 Dependence on other enabling machines and devices: Secondary | ICD-10-CM

## 2021-02-17 DIAGNOSIS — Z794 Long term (current) use of insulin: Secondary | ICD-10-CM

## 2021-02-17 DIAGNOSIS — Z0289 Encounter for other administrative examinations: Secondary | ICD-10-CM
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
## 2021-02-18 LAB — VITAMIN B12: Vitamin B-12: 586 pg/mL (ref 232–1245)

## 2021-02-18 LAB — HEMOGLOBIN A1C
Est. average glucose Bld gHb Est-mCnc: 148 mg/dL
Hgb A1c MFr Bld: 6.8 % — ABNORMAL HIGH (ref 4.8–5.6)

## 2021-02-18 LAB — FOLATE: Folate: 14.9 ng/mL (ref >3.0)

## 2021-02-18 LAB — TSH: TSH: 1.47 u[IU]/mL (ref 0.450–4.500)

## 2021-02-18 LAB — T4, FREE: Free T4: 1.24 ng/dL (ref 0.82–1.77)

## 2021-02-25 NOTE — Progress Notes (Signed)
Dear Dr. Nevada Crane,   Thank you for referring Roger Guerra to our clinic. The following note includes my evaluation and treatment recommendations.  Chief Complaint:   OBESITY Turner Baillie (MR# [id number]) is a 65 y.o. male who presents for evaluation and treatment of obesity and related comorbidities. Current BMI is Body mass index is 36.12 kg/m. Valery has been struggling with his weight for many years and has been unsuccessful in either losing weight, maintaining weight loss, or reaching his healthy weight goal.  Manual is currently in the action stage of change and ready to dedicate time achieving and maintaining a healthier weight. Dyke is interested in becoming our patient and working on intensive lifestyle modifications including (but not limited to) diet and exercise for weight loss.  Najae lives with is wife, Alma Friendly.  He is retired and works part time as a Pharmacist, hospital.  Craves breads, cereal, ice cream, and cookies, especially in the evenings.  Skips breakfast most days.  Tried Atkins in the past, which really lessened his cravings.  Sent here by PCP, Dr. Nevada Crane.  Saw Dr. Leeanne Rio at [facility] for weight loss in the past.  On 01/01/2021, he had labs with Dr. Nevada Crane - CBC, CMP, FLP, vitamin D (26.9).  Shedric's habits were reviewed today and are as follows: His family eats meals together, he thinks his family will eat healthier with him, he struggles with family and or coworkers weight loss sabotage, his desired weight loss is 71 pounds, he has been heavy most of his life, he started gaining weight over the last 30 years, his heaviest weight ever was 315 pounds, he craves breads, cereals, ice cream, and cookies, he skips breakfast frequently, he frequently eats larger portions than normal and he struggles with emotional eating.  Depression Screen Dhruvan's Food and Mood (modified PHQ-9) score was 20.  Depression screen [facility] 2/9 02/17/2021  Decreased Interest 3  Down, Depressed, Hopeless 3  PHQ - 2  Score 6  Altered sleeping 2  Tired, decreased energy 3  Change in appetite 2  Feeling bad or failure about yourself  2  Trouble concentrating 2  Moving slowly or fidgety/restless 2  Suicidal thoughts 1  PHQ-9 Score 20  Difficult doing work/chores Very difficult   Assessment/Plan:   Orders Placed This Encounter  Procedures  . Vitamin B12  . Folate  . Hemoglobin A1c  . T4, free  . TSH   1. Other fatigue Angelos admits to daytime somnolence and reports waking up still tired. Patent has a history of symptoms of daytime fatigue and morning fatigue. Abem generally gets 7 hours of sleep per night, and states that he has generally restful sleep. Snoring is not present. Apneic episodes are not present. Epworth Sleepiness Score is 9.  Hall has OSA and uses a CPAP.  Aaryav does feel that his weight is causing his energy to be lower than it should be. Fatigue may be related to obesity, depression or many other causes. Labs will be ordered, and in the meanwhile, Arthuro will focus on self care including making healthy food choices, increasing physical activity and focusing on stress reduction.  Check labs today.  - Vitamin B12 - Folate - T4, free - TSH  2. SOBOE (shortness of breath on exertion) [name] notes increasing shortness of breath with exercising and seems to be worsening over time with weight gain. He notes getting out of breath sooner with activity than he used to. This has gotten worse recently. Jakye denies shortness of  breath at rest or orthopnea.  Rockland does feel that he gets out of breath more easily that he used to when he exercises. Askari's shortness of breath appears to be obesity related and exercise induced. He has agreed to work on weight loss and gradually increase exercise to treat his exercise induced shortness of breath. Will continue to monitor closely.  Check IC and labs today.  - Vitamin B12 - Folate - T4, free - TSH  3. Type 2 diabetes mellitus with other  specified complication, with long-term current use of insulin ([place]) Diabetes Mellitus: Not at goal. Medication: metformin 500 mg twice daily, Basaglar. Issues reviewed: blood sugar goals, complications of diabetes mellitus, hypoglycemia prevention and treatment, exercise, and nutrition.  Diagnosed in 2007 (intolerant to [place]).  A1c was 6.7 months ago.  Plan:  Went to [facility] in 2015-2016 and lost 50 pounds with keto and gained 25 pounds back.  The importance of regular follow up with PCP and all other specialists as scheduled was stressed to patient today. The patient will continue to focus on protein-rich, low simple carbohydrate foods. We reviewed the importance of hydration, regular exercise for stress reduction, and restorative sleep.   Check A1c today.  Lab Results  Component Value Date   HGBA1C 6.8 (H) 02/17/2021   HGBA1C 6.0 07/15/2015   Lab Results  Component Value Date   CREATININE 0.68 08/22/2017   - Hemoglobin A1c  4. Vitamin D deficiency Not at goal. Current vitamin D is 26.9, tested on 01/01/2021. Optimal goal > 50 ng/dL.  He is taking an OTC vitamin D supplement.   Plan: Continue current OTC vitamin D supplementation.  Follow-up for routine testing of Vitamin D, at least 2-3 times per year to avoid over-replacement.  5. Other hyperlipidemia Lipid-lowering medications: Omega-3.  He has refused cholesterol medications by PCP.  Failed Crestor and Pravachol in the past.  Has appointment with [facility] in the near future - Dr. Domenic Polite in Cardiology.  Plan: Dietary changes: Increase soluble fiber, decrease simple carbohydrates, decrease saturated fat. Exercise changes: Moderate to vigorous-intensity aerobic activity 150 minutes per week or as tolerated. We will continue to monitor along with PCP/specialists as it pertains to his weight loss journey.  Management per Cardiology.  6. OSA on CPAP ESS is 9.  No concerns or complaints.  Plan:  Compliance and  importance of regular use discussed today.  OSA is a cause of systemic hypertension and is associated with an increased incidence of stroke, heart failure, atrial fibrillation, and coronary heart disease. Severe OSA increases all-cause mortality and  cardiovascular mortality.   Goal: Treatment of OSA via CPAP compliance and weight loss. . Plasma ghrelin levels (appetite or "hunger hormone") are significantly higher in OSA patients than in BMI-matched controls, but decrease to levels similar to those of obese patients without OSA after CPAP treatment.  . Weight loss improves OSA by several mechanisms, including reduction in fatty tissue in the throat (i.e. parapharyngeal fat) and the tongue. Loss of abdominal fat increases mediastinal traction on the upper airway making it less likely to collapse during sleep. . Studies have also shown that compliance with CPAP treatment improves leptin (hunger inhibitory hormone) imbalance.  7. Other depression, with emotional eating Not at goal. Medication: None.  With anxiety.  PHQ-9 is 20.  He denies depression or anxiety that is affecting life to a negative degree and declines the need for medication.  Plan:  Behavior modification techniques were discussed today to help deal  with emotional/non-hunger eating behaviors. Patient was referred to Dr. Mallie Mussel, our Bariatric Psychologist, for evaluation due to his elevated PHQ-9 score and significant struggles with emotional eating.  8. At risk for heart disease Due to Daivion's current state of health and medical condition(s), he is at a higher risk for heart disease.  This puts the patient at much greater risk to subsequently develop cardiopulmonary conditions that can significantly affect patient's quality of life in a negative manner.    At least 23 minutes were spent on counseling Itamar about these concerns today, and I stressed the importance of reversing risks factors of obesity, especially truncal and visceral  fat, hypertension, hyperlipidemia, and pre-diabetes.  The initial goal is to lose at least 5-10% of starting weight to help reduce these risk factors.  Counseling:  Intensive lifestyle modifications were discussed with [name] as the most appropriate first line of treatment.  he will continue to work on diet, exercise, and weight loss efforts.  We will continue to reassess these conditions on a fairly regular basis in an attempt to decrease the patient's overall morbidity and mortality.  Evidence-based interventions for health behavior change were utilized today including the discussion of self monitoring techniques, problem-solving barriers, and SMART goal setting techniques.  Specifically, regarding patient's less desirable eating habits and patterns, we employed the technique of small changes when Mclain has not been able to fully commit to his prudent nutritional plan.  9. Class 2 severe obesity with serious comorbidity and body mass index (BMI) of 36.0 to 36.9 in adult, unspecified obesity type [facility])  Ransome is currently in the action stage of change and his goal is to continue with weight loss efforts. I recommend Lovett begin the structured treatment plan as follows:  He has agreed to the Category 3 Plan.  Exercise goals: As is.   Behavioral modification strategies: increasing lean protein intake, decreasing simple carbohydrates, meal planning and cooking strategies and planning for success.  He was informed of the importance of frequent follow-up visits to maximize his success with intensive lifestyle modifications for his multiple health conditions. He was informed we would discuss his lab results at his next visit unless there is a critical issue that needs to be addressed sooner. Jibri agreed to keep his next visit at the agreed upon time to discuss these results.  Objective:   Blood pressure 112/70, pulse 76, temperature 97.9 F (36.6 C), height 5\' 11"  (1.803 m), weight 259 lb (117.5  kg), SpO2 96 %. Body mass index is 36.12 kg/m.  Indirect Calorimeter completed today shows a VO2 of 390 and a REE of 2712.  His calculated basal metabolic rate is 9892 thus his basal metabolic rate is better than expected.  General: Cooperative, alert, well developed, in no acute distress. HEENT: Conjunctivae and lids unremarkable. Cardiovascular: Regular rhythm.  Lungs: Normal work of breathing. Neurologic: No focal deficits.   Lab Results  Component Value Date   CREATININE 0.68 08/22/2017   BUN 10 08/22/2017   NA 139 08/22/2017   K 4.0 08/22/2017   CL 109 08/22/2017   CO2 24 08/22/2017   Lab Results  Component Value Date   HGBA1C 6.8 (H) 02/17/2021   HGBA1C 6.0 07/15/2015   Lab Results  Component Value Date   TSH 1.470 02/17/2021   Lab Results  Component Value Date   WBC 8.8 08/22/2017   HGB 15.7 08/22/2017   HCT 45.3 08/22/2017   MCV 88.8 08/22/2017   PLT 259 08/22/2017   Attestation Statements:  This is the patient's first visit at [facility]. The patient's NEW PATIENT PACKET was reviewed at length. Included in the packet: current and past health history, medications, allergies, ROS, gynecologic history (women only), surgical history, family history, social history, weight history, weight loss surgery history (for those that have had weight loss surgery), nutritional evaluation, mood and food questionnaire, PHQ9, Epworth questionnaire, sleep habits questionnaire, patient life and health improvement goals questionnaire. These will all be scanned into the patient's chart under media.   During the visit, I independently reviewed the patient's EKG, bioimpedance scale results, and indirect calorimeter results. I used this information to tailor a meal plan for the patient that will help him to lose weight and will improve his obesity-related conditions going forward. I performed a medically necessary appropriate examination and/or evaluation. I discussed the  assessment and treatment plan with the patient. The patient was provided an opportunity to ask questions and all were answered. The patient agreed with the plan and demonstrated an understanding of the instructions. Labs were ordered at this visit and will be reviewed at the next visit unless more critical results need to be addressed immediately. Clinical information was updated and documented in the EMR.   I, Water quality scientist, CMA, am acting as Location manager for [employer], DO.  I have reviewed the above documentation for accuracy and completeness, and I agree with the above. Marjory Sneddon, D.O.  The [place] was signed into law in 2016 which includes the topic of electronic health records.  This provides immediate access to information in MyChart.  This includes consultation notes, operative notes, office notes, lab results and pathology reports.  If you have any questions about what you read please let us know at your next visit so we can discuss your concerns and take corrective action if need be.  We are right here with you.

## 2021-03-02 ENCOUNTER — Telehealth (INDEPENDENT_AMBULATORY_CARE_PROVIDER_SITE_OTHER): Payer: BC Managed Care – PPO | Admitting: Psychology

## 2021-03-02 DIAGNOSIS — F3289 Other specified depressive episodes: Secondary | ICD-10-CM | POA: Diagnosis not present

## 2021-03-03 ENCOUNTER — Ambulatory Visit (INDEPENDENT_AMBULATORY_CARE_PROVIDER_SITE_OTHER): Payer: BC Managed Care – PPO | Admitting: Family Medicine

## 2021-03-03 ENCOUNTER — Encounter (INDEPENDENT_AMBULATORY_CARE_PROVIDER_SITE_OTHER): Payer: Self-pay | Admitting: Family Medicine

## 2021-03-03 ENCOUNTER — Other Ambulatory Visit: Payer: Self-pay

## 2021-03-03 VITALS — BP 128/77 | HR 71 | Temp 98.0°F | Ht 71.0 in | Wt 259.0 lb

## 2021-03-03 DIAGNOSIS — Z794 Long term (current) use of insulin: Secondary | ICD-10-CM

## 2021-03-03 DIAGNOSIS — E559 Vitamin D deficiency, unspecified: Secondary | ICD-10-CM

## 2021-03-03 DIAGNOSIS — E1169 Type 2 diabetes mellitus with other specified complication: Secondary | ICD-10-CM

## 2021-03-03 DIAGNOSIS — F32A Depression, unspecified: Secondary | ICD-10-CM | POA: Insufficient documentation

## 2021-03-03 DIAGNOSIS — Z6836 Body mass index (BMI) 36.0-36.9, adult: Secondary | ICD-10-CM

## 2021-03-03 DIAGNOSIS — R632 Polyphagia: Secondary | ICD-10-CM | POA: Diagnosis not present

## 2021-03-03 DIAGNOSIS — F3289 Other specified depressive episodes: Secondary | ICD-10-CM

## 2021-03-03 DIAGNOSIS — E785 Hyperlipidemia, unspecified: Secondary | ICD-10-CM

## 2021-03-03 MED ORDER — OZEMPIC (0.25 OR 0.5 MG/DOSE) 2 MG/1.5ML ~~LOC~~ SOPN
0.5000 mg | PEN_INJECTOR | SUBCUTANEOUS | 0 refills | Status: DC
Start: 2021-03-03 — End: 2021-03-23

## 2021-03-03 NOTE — Progress Notes (Signed)
  Office: [phone]  /  Fax: [phone]    Date: March 17, 2021   Appointment Start Time: 8:32am Duration: 28 minutes Provider: Glennie Isle, Psy.D. Type of Session: Individual Therapy  Location of Patient: Home Location of Provider: Provider's home (private office) Type of Contact: Telepsychological Visit via MyChart Video Visit  Session Content: Roger Guerra is a 65 y.o. male presenting for a follow-up appointment to address the previously established treatment goal of increasing coping skills. Today's appointment was a telepsychological visit due to COVID-19. Roger Guerra provided verbal consent for today's telepsychological appointment and he is aware he is responsible for securing confidentiality on his end of the session. Prior to proceeding with today's appointment, Roger Guerra's physical location at the time of this appointment was obtained as well a phone number he could be reached at in the event of technical difficulties. Roger Guerra and this provider participated in today's telepsychological service.   This provider conducted a brief check-in. Roger Guerra stated he started taking Ozempic, noting, "I already lost 9 pounds since my last visit there." He added he has noticed a difference in his clothing. Emotional and physical hunger were reviewed. Roger Guerra indicated he did not receive the e-mail, but a physical copy was given to him by Roger Guerra. With his verbal consent, the e-mail was re-sent along with a handout for triggers.  Psychoeducation regarding triggers for emotional eating was provided. Roger Guerra was provided a handout, and encouraged to utilize the handout between now and the next appointment to increase awareness of triggers and frequency. Roger Guerra agreed. This provider also discussed behavioral strategies for specific triggers, such as placing the utensil down when conversing to avoid mindless eating. Overall, Roger Guerra was receptive to today's appointment as evidenced by openness to sharing,  responsiveness to feedback, and willingness to explore triggers for emotional eating.  Mental Status Examination:  Appearance: well groomed and appropriate hygiene  Behavior: appropriate to circumstances Mood: euthymic Affect: mood congruent Speech: normal in rate, volume, and tone Eye Contact: appropriate Psychomotor Activity: unable to assess  Gait: unable to assess Thought Process: linear, logical, and goal directed  Thought Content/Perception: no hallucinations, delusions, bizarre thinking or behavior reported or observed and no evidence or endorsement of suicidal and homicidal ideation, plan, and intent Orientation: time, person, place, and purpose of appointment Memory/Concentration: memory, attention, language, and fund of knowledge intact  Insight/Judgment: good  Interventions:  Conducted a brief chart review Provided empathic reflections and validation Reviewed content from the previous session Employed supportive psychotherapy interventions to facilitate reduced distress and to improve coping skills with identified stressors Psychoeducation provided regarding triggers for emotional eating  DSM-5 Diagnosis(es): F32.89 Other Specified Depressive Disorder, Emotional Eating Behaviors  Treatment Goal & Progress: During the initial appointment with this provider, the following treatment goal was established: increase coping skills. Progress is limited, as Roger Guerra has just begun treatment with this provider; however, he is receptive to the interaction and interventions and rapport is being established. Additionally, he demonstrated increased awareness in hunger patterns.   Plan: Due to progress to date per Roger Guerra's self-report, the next appointment will be scheduled in one month, which will be via Roger Guerra Visit. The next session will focus on working towards the established treatment goal.

## 2021-03-11 NOTE — Progress Notes (Signed)
Chief Complaint:   OBESITY Roger Guerra is here to discuss his progress with his obesity treatment plan along with follow-up of his obesity related diagnoses.   Today's visit was #: 2 Starting weight: 259 lbs Starting date: 02/17/2021 Today's weight: 259 lbs Today's date: 03/03/2021 Weight change since last visit: 0 Total lbs lost to date: 0 Body mass index is 36.12 kg/m.   Interim History:  Roger Guerra is here today for his first follow-up office visit since starting the program with [place].  All blood work/ lab tests that were recently ordered by myself or an outside provider were reviewed with patient today per their request.   Extended time was spent counseling him on all new disease processes that were discovered or preexisting ones that are worsening.  Roger Guerra understands that many of these abnormalities will need to monitored regularly along with the current treatment plan of prudent dietary changes, in which we are making each and every office visit, to improve these health parameters.  We reviewed his new meal plan in detail and questions were answered.  Patient's food recall appears to be accurate and consistent with what is on plan when Roger Guerra is following it.   When eating on plan, his hunger and cravings are well controlled.    Roger Guerra had to eat other unhealthy foods in the home first and had 3 binge eating days since his last visit.  For 5 days, Roger Guerra was able to follow the meal plan, but still modified it to fit Roger Guerra's keto-type diet/Dr. Cannon Kettle keto meal plan.  Plan:  Recommend Roger Guerra follow-up with one bariatric medicine specialist at a time and do not combine plans.  Also recommend Roger Guerra follow-up with PCP for management of his diabetes, not a Psychologist, sport and exercise.  Current Meal Plan: following a lower carbohydrate, vegetable and lean protein rich diet plan for 50% of the time.  Current Exercise Plan: Swimming for 40 minutes 3 times per week.  Assessment/Plan:   Medications Discontinued During This  Encounter  Medication Reason   Insulin Glargine (BASAGLAR KWIKPEN [place]ahue) Change in therapy   Meds ordered this encounter  Medications   Semaglutide,0.25 or 0.5MG /DOS, (OZEMPIC, 0.25 OR 0.5 MG/DOSE,) 2 MG/1.5ML SOPN    Sig: Inject 0.5 mg into the skin once a week.    Dispense:  1.5 mL    Refill:  0    1. Type 2 diabetes mellitus with other specified complication, with long-term current use of insulin (HCC) Diabetes Mellitus: Not at goal. Medication: Insulin and metformin 500 mg twice daily. Issues reviewed: blood sugar goals, complications of diabetes mellitus, hypoglycemia prevention and treatment, exercise, and nutrition.  A1c 6.7 in April, but recently 6.8.  disease being treated by Dr. Arlyss Gandy, Bariatric Surgeon in [place].  Roger Guerra currently has the patient on insulin and PCP has him on metformin.  Plan:  discussed with patient that I do not feel insulin is necessary and also that it will cause an increase in hunger and weight.  Discontinue insulin and continue metformin.  Add Ozempic.  The importance of regular follow up with PCP and all other specialists as scheduled was stressed to patient today. The patient will continue to focus on protein-rich, low simple carbohydrate foods. We reviewed the importance of hydration, regular exercise for stress reduction, and restorative sleep.   Lab Results  Component Value Date   HGBA1C 6.8 (H) 02/17/2021   HGBA1C 6.0 07/15/2015   Lab Results  Component Value Date   CREATININE 0.68 08/22/2017   -  Start Semaglutide,0.25 or 0.5MG /DOS, (OZEMPIC, 0.25 OR 0.5 MG/DOSE,) 2 MG/1.5ML SOPN; Inject 0.5 mg into the skin once a week.  Dispense: 1.5 mL; Refill: 0  2. Hyperlipidemia associated with type 2 diabetes mellitus ([place]) Course: Not at goal. Lipid-lowering medications: None.  Intolerant to Crestor and Pravachol in the past, per patient.  Has follow-up with Lipid Clinic in the near future - Dr. Domenic Polite.  LDL 157, HDL <40, at 38 on 01/13/2021.  Plan: Continue  with testosterone treatments as increasing testosterone may lower cholesterol.  Treatment plan per Lipid Clinic.  Importance of getting levels better controlled via medication stressed to patient.  Follow prudent nutritional plan and weight loss.    Dietary changes: Increase soluble fiber, decrease simple carbohydrates, decrease saturated fat. Exercise changes: Moderate to vigorous-intensity aerobic activity 150 minutes per week or as tolerated. We will continue to monitor along with PCP/specialists as it pertains to his weight loss journey.  3. Polyphagia On insulin.  Some hunger even after dinner.  Unsure of calories or amounts/day.  Plan:  Start Ozempic 0.5 mg subcutaneously weekly.  Continue prudent nutritional plan, and in addition, low carb and journal so we can see calories per day, etc.  Roger Guerra knows to start at 0.25 mg weekly and increase the next week as tolerated.  Polyphagia refers to excessive feelings of hunger. Roger Guerra will continue to focus on protein-rich, low simple carbohydrate foods. We reviewed the importance of hydration, regular exercise for stress reduction, and restorative sleep.  4. Vitamin D deficiency Not at goal. Current vitamin D is 29.6, tested on 01/13/2021 with Dr. [place] Crane. Optimal goal > 50 ng/dL.  Roger Guerra is on supplements per his PCP.  Plan:  Continue supplement.  5. Other depression, with emotional eating Not at goal. Medication: None.  Referred to Dr. Mallie Mussel, and had first visit with her.  Plan:  Handouts on emotional versus physical hunger given to patient.  Behavior modification techniques were discussed today to help deal with emotional/non-hunger eating behaviors.  6. Obesity, current BMI 36.2  Course: Roger Guerra is currently in the action stage of change. As such, his goal is to continue with weight loss efforts.   Nutrition goals: Roger Guerra has agreed to keeping a food journal and adhering to recommended goals of [phone] calories and 120+ protein and following a lower  carbohydrate, vegetable and lean protein rich diet plan.   Exercise goals:  As is.  Behavioral modification strategies: increasing lean protein intake, decreasing simple carbohydrates, increasing water intake, meal planning and cooking strategies, keeping healthy foods in the home, planning for success, and keeping a strict food journal.  Roger Guerra has agreed to follow-up with our clinic in 2-3 weeks. Roger Guerra was informed of the importance of frequent follow-up visits to maximize his success with intensive lifestyle modifications for his multiple health conditions.   Objective:   Blood pressure 128/77, pulse 71, temperature 98 F (36.7 C), height 5\' 11"  (1.803 m), weight 259 lb (117.5 kg), SpO2 97 %. Body mass index is 36.12 kg/m.  General: Cooperative, alert, well developed, in no acute distress. HEENT: Conjunctivae and lids unremarkable. Cardiovascular: Regular rhythm.  Lungs: Normal work of breathing. Neurologic: No focal deficits.   Lab Results  Component Value Date   CREATININE 0.68 08/22/2017   BUN 10 08/22/2017   NA 139 08/22/2017   K 4.0 08/22/2017   CL 109 08/22/2017   CO2 24 08/22/2017   Lab Results  Component Value Date   HGBA1C 6.8 (H) 02/17/2021   HGBA1C 6.0 07/15/2015  Lab Results  Component Value Date   TSH 1.470 02/17/2021   Lab Results  Component Value Date   WBC 8.8 08/22/2017   HGB 15.7 08/22/2017   HCT 45.3 08/22/2017   MCV 88.8 08/22/2017   PLT 259 08/22/2017   Attestation Statements:   Reviewed by clinician on day of visit: allergies, medications, problem list, medical history, surgical history, family history, social history, and previous encounter notes.  Time spent on visit including pre-visit chart review and post-visit care and charting was 50 minutes.   I, Water quality scientist, CMA, am acting as Location manager for [employer], DO.  I have reviewed the above documentation for accuracy and completeness, and I agree with the above. Marjory Sneddon, D.O.  The [place] was signed into law in 2016 which includes the topic of electronic health records.  This provides immediate access to information in MyChart.  This includes consultation notes, operative notes, office notes, lab results and pathology reports.  If you have any questions about what you read please let us know at your next visit so we can discuss your concerns and take corrective action if need be.  We are right here with you.

## 2021-03-16 ENCOUNTER — Telehealth (INDEPENDENT_AMBULATORY_CARE_PROVIDER_SITE_OTHER): Payer: BC Managed Care – PPO | Admitting: Internal Medicine

## 2021-03-16 ENCOUNTER — Encounter: Payer: Self-pay | Admitting: Internal Medicine

## 2021-03-16 VITALS — Wt 259.0 lb

## 2021-03-16 DIAGNOSIS — E559 Vitamin D deficiency, unspecified: Secondary | ICD-10-CM

## 2021-03-16 DIAGNOSIS — Z5181 Encounter for therapeutic drug level monitoring: Secondary | ICD-10-CM

## 2021-03-16 DIAGNOSIS — E291 Testicular hypofunction: Secondary | ICD-10-CM

## 2021-03-16 DIAGNOSIS — E785 Hyperlipidemia, unspecified: Secondary | ICD-10-CM

## 2021-03-16 DIAGNOSIS — E1169 Type 2 diabetes mellitus with other specified complication: Secondary | ICD-10-CM

## 2021-03-16 DIAGNOSIS — I2510 Atherosclerotic heart disease of native coronary artery without angina pectoris: Secondary | ICD-10-CM

## 2021-03-16 DIAGNOSIS — E230 Hypopituitarism: Secondary | ICD-10-CM

## 2021-03-16 DIAGNOSIS — R7989 Other specified abnormal findings of blood chemistry: Secondary | ICD-10-CM

## 2021-03-16 DIAGNOSIS — Z79890 Hormone replacement therapy (postmenopausal): Secondary | ICD-10-CM

## 2021-03-16 MED ORDER — EZETIMIBE 10 MG PO TABS: 10.0000 mg | ORAL_TABLET | Freq: Every day | ORAL | 3 refills | Status: DC

## 2021-03-16 NOTE — Progress Notes (Signed)
Virtual Visit via Video Note   This visit type was conducted due to national recommendations for restrictions regarding the COVID-19 Pandemic (e.g. social distancing) in an effort to limit this patient's exposure and mitigate transmission in our community.  Due to his co-morbid illnesses, this patient is at least at moderate risk for complications without adequate follow up.  This format is felt to be most appropriate for this patient at this time.  All issues noted in this document were discussed and addressed.  A limited physical exam was performed with this format.  Please refer to the patient's chart for his consent to telehealth for [facility].      Date:  03/16/2021   ID:  Roger Guerra, DOB [birth year], MRN [id number] The patient was identified using 2 identifiers.  Evaluation Performed:  New Patient Evaluation  Patient Location:  [place] [id number]  Provider location:   [address] [place], [place] [address]  PCP:  Celene Squibb, MD  Cardiologist:  Rozann Lesches, MD Electrophysiologist:  None   Chief Complaint:  Manage dyslipidemia  History of Present Illness:    Roger Guerra is a 65 y.o. male who presents via audio/video conferencing for a telehealth visit today.  Roger Guerra is a patient of Dr. Domenic Polite with a history of hypopituitarism, low testosterone, type 2 diabetes, erectile dysfunction, coronary artery calcification/atherosclerosis, low vitamin D and dyslipidemia.  He has had prior DVT as well.  He also has been intolerant to statins, namely rosuvastatin and pravastatin which caused profound myalgias and he developed thrombosis after starting 1 statin which she felt might be related.  Recently he had a lipid profile that remains abnormal showing total cholesterol 215, triglycerides 111, HDL 38 and LDL 157.  Although he is on testosterone supplementation, his total serum testosterone was low at 100 ng/dL, percent free  testosterone was low at 1% and free testosterone was only 10 pg/mL.  Vitamin D was also low at 26.9 ng/mL.  He was started on 2000 units daily supplementation for this.  His testosterone has not been adjusted.  He is very hesitant to take a statin.  We had a long discussion about the fact that statins may be very well-tolerated and do have the best cardiovascular risk reduction data.  He is also working on weight loss and has been referred to the weight management clinic seeing Dr. Raliegh Scarlet.  She recently checked a TSH and free T4 which were normal.  He has been started on Ozempic. A1c is elevated at 6.8.  Diet is variable but could be better with regards to reducing saturated fats and carbohydrates.  The patient does not have symptoms concerning for COVID-19 infection (fever, chills, cough, or new SHORTNESS OF BREATH).    Prior CV studies:   The following studies were reviewed today:  Chart reviewed, lab work  PMHx:  Past Medical History:  Diagnosis Date   Anxiety disorder    Arthritis    Atherosclerosis    Back pain    Constipation    Coronary artery calcification seen on CT scan    Depression    Diabetes (HCC)    Erectile dysfunction    GERD (gastroesophageal reflux disease)    Heartburn    History of blood clots    History of DVT (deep vein thrombosis)    History of DVT of lower extremity    Right leg 2002    History of pneumonia    Hyperlipidemia  Hypogonadism in male    Hypopituitarism [facility])    Joint pain    Nerve root and plexus disorder    Obesity    Obstructive sleep apnea    OSA on CPAP    Osteoarthritis    Sciatica    Sleep apnea    SOBOE (shortness of breath on exertion)    Spinal stenosis    Spondylosis    Swelling of both lower extremities    Type 2 diabetes mellitus (HCC)    Vitamin D deficiency     Past Surgical History:  Procedure Laterality Date   BLADDER SURGERY  2015   removal of skin tag near urethra   COLONOSCOPY N/A 12/10/2016   Procedure:  COLONOSCOPY;  Surgeon: Daneil Dolin, MD;  Location: [facility];  Service: Endoscopy;  Laterality: N/A;  8:30 AM   KNEE ARTHROSCOPY WITH MEDIAL MENISECTOMY Right 08/25/2017   Procedure: Right knee arthroscopy, partial medial menisectomy and debridement;  Surgeon: Susa Day, MD;  Location: [facility] ORS;  Service: Orthopedics;  Laterality: Right;  60 mins   POLYPECTOMY  12/10/2016   Procedure: POLYPECTOMY;  Surgeon: Daneil Dolin, MD;  Location: [facility];  Service: Endoscopy;;  hepatic flexure   TONSILLECTOMY      FAMHx:  Family History  Problem Relation Age of Onset   Lung cancer Father    Heart disease Father    Stroke Father    Cancer Father    Diabetes Mother    Diabetes Other        Siblings   Diabetes Sister     SOCHx:   reports that he has quit smoking. His smoking use included cigarettes. He has a 37.50 pack-year smoking history. He has never used smokeless tobacco. He reports that he does not drink alcohol and does not use drugs.  ALLERGIES:  Allergies  Allergen Reactions   Invokana [Canagliflozin] Other (See Comments)    Yeast infection    MEDS:  Current Meds  Medication Sig   5-Hydroxytryptophan (5-HTP PO) Take by mouth.   aspirin 81 MG tablet Take 81 mg by mouth daily.   carisoprodol (SOMA) 350 MG tablet Take 350 mg 3 (three) times daily as needed by mouth for muscle spasms.   cetirizine (ZYRTEC) 10 MG tablet Take 10 mg by mouth every evening.    co-enzyme Q-10 30 MG capsule Take 30 mg by mouth daily.   ezetimibe (ZETIA) 10 MG tablet Take 1 tablet (10 mg total) by mouth daily.   fexofenadine (ALLEGRA) 180 MG tablet Take 180 mg by mouth every morning.    ibuprofen (ADVIL) 800 MG tablet Take 800 mg by mouth in the morning and at bedtime.   MAGNESIUM PO Take by mouth.   metFORMIN (GLUCOPHAGE) 500 MG tablet Take 500 mg by mouth as directed. Take 1 tablet in the morning and 2 tablets in the evening.   Multiple Vitamin (MULTIVITAMIN WITH MINERALS) TABS tablet  Take 1 tablet by mouth daily.   Multiple Vitamins-Minerals (ZINC PO) Take by mouth.   Omega-3 Fatty Acids (FISH OIL PO) Take 1 capsule by mouth daily.   omeprazole (PRILOSEC OTC) 20 MG tablet Take 20 mg by mouth daily as needed (acid reflux).   PRESCRIPTION MEDICATION Inject 1 Dose as directed as needed (erectile dysfunction). Trimix Papaverine 30mg /ml Phentolamine 1mg /ml Prostaglandin 85mcg/ml   Semaglutide,0.25 or 0.5MG /DOS, (OZEMPIC, 0.25 OR 0.5 MG/DOSE,) 2 MG/1.5ML SOPN Inject 0.5 mg into the skin once a week.   sodium chloride (OCEAN) 0.65 %  SOLN nasal spray Place 1 spray into both nostrils as needed for congestion.   testosterone cypionate (DEPOTESTOSTERONE CYPIONATE) 200 MG/ML injection Inject 200 mg into the muscle every 14 (fourteen) days.   traMADol (ULTRAM) 50 MG tablet Take 50 mg by mouth every 6 (six) hours as needed.   vitamin E 400 UNIT capsule Take 400 Units by mouth daily.     ROS: Pertinent items noted in HPI and remainder of comprehensive ROS otherwise negative.  Labs/Other Tests and Data Reviewed:    Recent Labs: 02/17/2021: TSH 1.470   Recent Lipid Panel No results found for: CHOL, TRIG, HDL, CHOLHDL, LDLCALC, LDLDIRECT  Wt Readings from Last 3 Encounters:  03/16/21 259 lb (117.5 kg)  03/03/21 259 lb (117.5 kg)  02/17/21 259 lb (117.5 kg)     Exam:    Vital Signs:  Wt 259 lb (117.5 kg)   BMI 36.12 kg/m    General appearance: alert and no distress Lungs: No visual respiratory difficulty Abdomen: Moderately obese Extremities: extremities normal, atraumatic, no cyanosis or edema Skin: Skin color, texture, turgor normal. No rashes or lesions Neurologic: Grossly normal Psych: Pleasan  ASSESSMENT & PLAN:    Mixed dyslipidemia, metabolic pattern Low testosterone Vitamin D deficiency Hypopituitarism (selective, not on steroids) Coronary artery calcification/atherosclerosis Family history of heart disease and stroke  Mr. Beitler has a metabolic  dyslipidemia with a low HDL cholesterol and elevated LDL cholesterol.  There is family history of heart disease.  His dyslipidemia is more of a metabolic pattern which probably fits his diabetes and hypopituitarism.  He recently was started on Ozempic which should help with weight loss.  We discussed statin options however he is very reluctant to take them.  He was agreeable to trying ezetimibe 10 mg daily.  Hopefully this in addition to metabolic changes such as weight loss and dietary changes will help with his lipid profile.  I do believe he is undertreated with regards to his testosterone and I would recommend increasing his dosing.  In addition he was recently started on vitamin D replacement for low vitamin D level.  Both of these should help his cholesterol.  We will plan a repeat lipid NMR, recheck of testosterone and vitamin D levels in about 3 months.  Thanks again for the kind referral.  COVID-19 Education: The signs and symptoms of COVID-19 were discussed with the patient and how to seek care for testing (follow up with PCP or arrange E-visit).  The importance of social distancing was discussed today.  Patient Risk:   After full review of this patients clinical status, I feel that they are at least moderate risk at this time.  Time:   Today, I have spent 25 minutes with the patient with telehealth technology discussing dyslipidemia, metabolic disorders, pituitary abnormality, low testosterone, low vitamin D level, coronary calcification.     Medication Adjustments/Labs and Tests Ordered: Current medicines are reviewed at length with the patient today.  Concerns regarding medicines are outlined above.   Tests Ordered: Orders Placed This Encounter  Procedures   NMR, lipoprofile   Vitamin D (25 hydroxy)   Testosterone,Free and Total    Medication Changes: Meds ordered this encounter  Medications   ezetimibe (ZETIA) 10 MG tablet    Sig: Take 1 tablet (10 mg total) by mouth daily.     Dispense:  90 tablet    Refill:  3    Disposition:  in 3 month(s)  Pixie Casino, MD, [facility], [place]  [facility] of the Advanced Lipid Disorders &  Cardiovascular Risk Reduction Clinic Diplomate of the American Board of Clinical Lipidology Attending Cardiologist  Direct Dial: [phone]  Fax: [phone]  Website:  [email or web address]  Pixie Casino, MD  03/16/2021 8:31 AM

## 2021-03-16 NOTE — Patient Instructions (Signed)
start zetia 10 mg daily - he is not taking lipitor, won't take a statin - check lipid NMR In 3 months, Vit D level and Free/Total testosterone. Follow-up then. Thanks

## 2021-03-17 ENCOUNTER — Telehealth (INDEPENDENT_AMBULATORY_CARE_PROVIDER_SITE_OTHER): Payer: BC Managed Care – PPO | Admitting: Psychology

## 2021-03-17 DIAGNOSIS — F3289 Other specified depressive episodes: Secondary | ICD-10-CM | POA: Diagnosis not present

## 2021-03-23 ENCOUNTER — Encounter (INDEPENDENT_AMBULATORY_CARE_PROVIDER_SITE_OTHER): Payer: Self-pay | Admitting: Family Medicine

## 2021-03-23 ENCOUNTER — Other Ambulatory Visit: Payer: Self-pay

## 2021-03-23 ENCOUNTER — Ambulatory Visit (INDEPENDENT_AMBULATORY_CARE_PROVIDER_SITE_OTHER): Payer: BC Managed Care – PPO | Admitting: Family Medicine

## 2021-03-23 VITALS — BP 123/76 | HR 85 | Temp 97.7°F | Ht 71.0 in | Wt 247.0 lb

## 2021-03-23 DIAGNOSIS — Z6836 Body mass index (BMI) 36.0-36.9, adult: Secondary | ICD-10-CM

## 2021-03-23 DIAGNOSIS — E1169 Type 2 diabetes mellitus with other specified complication: Secondary | ICD-10-CM

## 2021-03-23 DIAGNOSIS — Z794 Long term (current) use of insulin: Secondary | ICD-10-CM

## 2021-03-23 DIAGNOSIS — Z9189 Other specified personal risk factors, not elsewhere classified: Secondary | ICD-10-CM

## 2021-03-23 MED ORDER — OZEMPIC (0.25 OR 0.5 MG/DOSE) 2 MG/1.5ML ~~LOC~~ SOPN
0.5000 mg | PEN_INJECTOR | SUBCUTANEOUS | 0 refills | Status: DC
Start: 2021-03-23 — End: 2021-04-13

## 2021-03-31 NOTE — Progress Notes (Signed)
Office: [phone]  /  Fax: [phone]    Date: April 14, 2021   Appointment Start Time: 2:00pm Duration: 31 minutes Provider: Glennie Isle, Psy.D. Type of Session: Individual Therapy  Location of Patient: Home Location of Provider: Provider's home (private office) Type of Contact: Telepsychological Visit via MyChart Video Visit  Session Content: Roger Guerra is a 65 y.o. male presenting for a follow-up appointment to address the previously established treatment goal of increasing coping skills. Today's appointment was a telepsychological visit due to COVID-19. Roger Guerra provided verbal consent for today's telepsychological appointment and he is aware he is responsible for securing confidentiality on his end of the session. Prior to proceeding with today's appointment, Roger Guerra's physical location at the time of this appointment was obtained as well a phone number he could be reached at in the event of technical difficulties. Roger Guerra and this provider participated in today's telepsychological service.   This provider conducted a brief check-in. Roger Guerra acknowledged deviations from his meal plan while traveling, noting he is back on track. He added, "I've lost a total of 14 pounds in one month." Positive reinforcement was provided. Reviewed triggers for emotional eating. He reflected feeling "triggered" when visiting his mother, as she is reportedly a "food pusher." Roger Guerra also identified having an increase in cravings for sweets, which he feels is due to experiencing "resentment, loneliness, self-pity" as his wife is not home as much.   Psychoeducation regarding mindfulness was provided. He discussed he previously engaged in mindfulness "regularly" approximately 15-20 years ago. This provider also explained the benefit of mindfulness as it relates to emotional eating. Roger Guerra was encouraged to engage in the provided exercises between now and the next appointment with this provider. Roger Guerra agreed. During  today's appointment, Roger Guerra was led through a mindfulness exercise involving his senses. Roger Guerra provided verbal consent during today's appointment for this provider to send a handout about mindfulness via e-mail. Roger Guerra was receptive to today's appointment as evidenced by openness to sharing, responsiveness to feedback, and willingness to engage in mindfulness exercises to assist with coping.  Mental Status Examination:  Appearance: well groomed and appropriate hygiene  Behavior: appropriate to circumstances Mood: euthymic Affect: mood congruent Speech: normal in rate, volume, and tone Eye Contact: appropriate Psychomotor Activity: unable to assess Gait: unable to assess Thought Process: linear, logical, and goal directed  Thought Content/Perception: no hallucinations, delusions, bizarre thinking or behavior reported or observed and no evidence or endorsement of suicidal and homicidal ideation, plan, and intent Orientation: time, person, place, and purpose of appointment Memory/Concentration: memory, attention, language, and fund of knowledge intact  Insight/Judgment: good  Interventions:  Conducted a brief chart review Provided empathic reflections and validation Reviewed content from the previous session Processed thoughts and feelings Psychoeducation provided regarding mindfulness Engaged patient in a mindfulness exercise Provided positive reinforcement Employed supportive psychotherapy interventions to facilitate reduced distress, and to improve coping skills with identified stressors Employed acceptance and commitment interventions to emphasize mindfulness and acceptance without struggle  DSM-5 Diagnosis(es): F32.89 Other Specified Depressive Disorder, Emotional Eating Behaviors  Treatment Goal & Progress: During the initial appointment with this provider, the following treatment goal was established: increase coping skills. Roger Guerra has demonstrated progress in his goal as evidenced  by increased awareness of hunger patterns and increased awareness of triggers for emotional eating behaviors. Roger Guerra also demonstrates willingness to engage in mindfulness exercises.  Plan: Roger Guerra shared his insurance will be changing soon; therefore, he requested the next appointment with this provider be scheduled in five weeks, which will  be via [place] Visit. He was encouraged to call the clinic to scheduled sooner if needed. The next session will focus on working towards the established treatment goal.

## 2021-04-01 NOTE — Progress Notes (Signed)
Chief Complaint:   OBESITY Roger Guerra is here to discuss his progress with his obesity treatment plan along with follow-up of his obesity related diagnoses.   Today's visit was #: 3 Starting weight: 259 lbs Starting date: 02/17/2021 Today's weight: 247 lbs Today's date: 03/23/2021 Weight change since last visit: 12 lbs Total lbs lost to date: 12 lbs Body mass index is 34.45 kg/m.  Total weight loss percentage to date: -4.63%  Interim History:  Roger Guerra changed to journaling at his last office visit.  He says it is working well for him.  It makes him aware of what he is eating.  Hunger and cravings are controlled.  Current Meal Plan: keeping a food journal and adhering to recommended goals of 2000-2200 calories and 120+ grams of protein and following a lower carbohydrate, vegetable and lean protein rich diet plan for 90% of the time.  Current Exercise Plan: Swimming for 45 minutes 2 times per week. Current Anti-Obesity Medications: Ozempic 0.5 mg subcutaneously weekly. Side effects: None.  Assessment/Plan:   Medications Discontinued During This Encounter  Medication Reason   Semaglutide,0.25 or 0.5MG /DOS, (OZEMPIC, 0.25 OR 0.5 MG/DOSE,) 2 MG/1.5ML SOPN Reorder   Meds ordered this encounter  Medications   Semaglutide,0.25 or 0.5MG /DOS, (OZEMPIC, 0.25 OR 0.5 MG/DOSE,) 2 MG/1.5ML SOPN    Sig: Inject 0.5 mg into the skin once a week.    Dispense:  1.5 mL    Refill:  0   1. Type 2 diabetes mellitus with other specified complication, with long-term current use of insulin (HCC) Diabetes Mellitus: Not at goal. Medication: Ozempic 0.5 mg subcutaneously weekly and metformin 500 mg in the morning and 1,000 mg at night. Issues reviewed: blood sugar goals, complications of diabetes mellitus, hypoglycemia prevention and treatment, exercise, and nutrition.  FBS 100 - 106, 99 this morning.  Discontinued insulin and added Ozempic at last office visit.  Blood sugars have been great.  No lows or  highs.  Now 99-129.  Much less hunger now that he is off insulin.  Plan:  Refill Ozempic at 0.5 mg subcutaneously weekly.  He has been on it for 1 week and is tolerating it well.   The patient was encouraged to monitor blood sugars regularly and to bring their log to the next appointment for review.  The importance of regular follow up with PCP and all other specialists as scheduled was stressed to patient today. The patient will continue to focus on protein-rich, low simple carbohydrate foods. We reviewed the importance of hydration, regular exercise for stress reduction, and restorative sleep.   Lab Results  Component Value Date   HGBA1C 6.8 (H) 02/17/2021   HGBA1C 6.0 07/15/2015   Lab Results  Component Value Date   CREATININE 0.68 08/22/2017   - Refill Semaglutide,0.25 or 0.5MG /DOS, (OZEMPIC, 0.25 OR 0.5 MG/DOSE,) 2 MG/1.5ML SOPN; Inject 0.5 mg into the skin once a week.  Dispense: 1.5 mL; Refill: 0  2. At risk for side effect of medication Due to Roger Guerra's current conditions and medications, he is at a higher risk for drug side effect.  At least 9 minutes was spent on counseling him about these concerns today.  We discussed the benefits and potential risks of these medications, and all of patient's concerns were addressed and questions were answered.  he will call us, or their PCP or other specialists who treat their conditions with medications, with any questions or concerns that may develop.     3. Obesity BMI today 34  Course: Roger Guerra is currently in the action stage of change. As such, his goal is to continue with weight loss efforts.   Nutrition goals: He has agreed to keeping a food journal and adhering to recommended goals of 2000-2200 calories and 120+ grams of protein and following a lower carbohydrate, vegetable and lean protein rich diet plan.   Exercise goals:  As is.  Behavioral modification strategies: planning for success and keeping a strict food journal.  Roger Guerra has  agreed to follow-up with our clinic in 2-3 weeks. He was informed of the importance of frequent follow-up visits to maximize his success with intensive lifestyle modifications for his multiple health conditions.   Objective:   Blood pressure 123/76, pulse 85, temperature 97.7 F (36.5 C), height 5\' 11"  (1.803 m), weight 247 lb (112 kg), SpO2 97 %. Body mass index is 34.45 kg/m.  General: Cooperative, alert, well developed, in no acute distress. HEENT: Conjunctivae and lids unremarkable. Cardiovascular: Regular rhythm.  Lungs: Normal work of breathing. Neurologic: No focal deficits.   Lab Results  Component Value Date   CREATININE 0.68 08/22/2017   BUN 10 08/22/2017   NA 139 08/22/2017   K 4.0 08/22/2017   CL 109 08/22/2017   CO2 24 08/22/2017   Lab Results  Component Value Date   HGBA1C 6.8 (H) 02/17/2021   HGBA1C 6.0 07/15/2015   Lab Results  Component Value Date   TSH 1.470 02/17/2021   Lab Results  Component Value Date   WBC 8.8 08/22/2017   HGB 15.7 08/22/2017   HCT 45.3 08/22/2017   MCV 88.8 08/22/2017   PLT 259 08/22/2017   Attestation Statements:   Reviewed by clinician on day of visit: allergies, medications, problem list, medical history, surgical history, family history, social history, and previous encounter notes.  I, Water quality scientist, CMA, am acting as Location manager for [employer], DO.  I have reviewed the above documentation for accuracy and completeness, and I agree with the above. Marjory Sneddon, D.O.  The [place] was signed into law in 2016 which includes the topic of electronic health records.  This provides immediate access to information in MyChart.  This includes consultation notes, operative notes, office notes, lab results and pathology reports.  If you have any questions about what you read please let us know at your next visit so we can discuss your concerns and take corrective action if need be.  We are right here with  you.

## 2021-04-13 ENCOUNTER — Ambulatory Visit (INDEPENDENT_AMBULATORY_CARE_PROVIDER_SITE_OTHER): Payer: BC Managed Care – PPO | Admitting: Family Medicine

## 2021-04-13 ENCOUNTER — Other Ambulatory Visit: Payer: Self-pay

## 2021-04-13 ENCOUNTER — Encounter (INDEPENDENT_AMBULATORY_CARE_PROVIDER_SITE_OTHER): Payer: Self-pay | Admitting: Family Medicine

## 2021-04-13 VITALS — BP 128/80 | HR 80 | Temp 97.9°F | Ht 71.0 in | Wt 245.0 lb

## 2021-04-13 DIAGNOSIS — Z9189 Other specified personal risk factors, not elsewhere classified: Secondary | ICD-10-CM

## 2021-04-13 DIAGNOSIS — E1169 Type 2 diabetes mellitus with other specified complication: Secondary | ICD-10-CM | POA: Diagnosis not present

## 2021-04-13 DIAGNOSIS — E559 Vitamin D deficiency, unspecified: Secondary | ICD-10-CM

## 2021-04-13 DIAGNOSIS — Z794 Long term (current) use of insulin: Secondary | ICD-10-CM

## 2021-04-13 DIAGNOSIS — F3289 Other specified depressive episodes: Secondary | ICD-10-CM | POA: Diagnosis not present

## 2021-04-13 DIAGNOSIS — Z6836 Body mass index (BMI) 36.0-36.9, adult: Secondary | ICD-10-CM

## 2021-04-13 DIAGNOSIS — K5900 Constipation, unspecified: Secondary | ICD-10-CM

## 2021-04-13 MED ORDER — SEMAGLUTIDE (1 MG/DOSE) 4 MG/3ML ~~LOC~~ SOPN: 1.0000 mg | PEN_INJECTOR | SUBCUTANEOUS | 0 refills | Status: DC

## 2021-04-14 ENCOUNTER — Telehealth (INDEPENDENT_AMBULATORY_CARE_PROVIDER_SITE_OTHER): Payer: BC Managed Care – PPO | Admitting: Psychology

## 2021-04-14 DIAGNOSIS — F3289 Other specified depressive episodes: Secondary | ICD-10-CM | POA: Diagnosis not present

## 2021-04-14 LAB — BASIC METABOLIC PANEL
BUN: 7 (ref 4–21)
Chloride: 101 (ref 99–108)
Creatinine: 0.8 (ref 0.6–1.3)
Glucose: 119
Potassium: 5 (ref 3.4–5.3)
Sodium: 138 (ref 137–147)

## 2021-04-14 LAB — HEPATIC FUNCTION PANEL
ALT: 25 (ref 10–40)
AST: 16 (ref 14–40)
Alkaline Phosphatase: 45 (ref 25–125)
Bilirubin, Total: 0.3

## 2021-04-14 LAB — CBC AND DIFFERENTIAL
HCT: 50 (ref 41–53)
Hemoglobin: 17 (ref 13.5–17.5)
Platelets: 237 (ref 150–399)
WBC: 7.2

## 2021-04-14 LAB — HEMOGLOBIN A1C: Hemoglobin A1C: 6.6

## 2021-04-14 LAB — LIPID PANEL
Cholesterol: 154 (ref 0–200)
HDL: 35 (ref 35–70)
LDL Cholesterol: 105
Triglycerides: 70 (ref 40–160)

## 2021-04-14 LAB — CBC: RBC: 5.56 — AB (ref 3.87–5.11)

## 2021-04-23 NOTE — Progress Notes (Signed)
Chief Complaint:   OBESITY Roger Guerra is here to discuss his progress with his obesity treatment plan along with follow-up of his obesity related diagnoses.   Today's visit was #: 4 Starting weight: 259 lbs Starting date: 02/17/2021 Today's weight: 245 labs Today's date: 04/13/2021 Weight change since last visit: -2 lbs Total lbs lost to date: 14 lbs Body mass index is 34.17 kg/m.  Total weight loss percentage to date: -5.41%  Interim History: Roger Guerra reports traveling to his mothers for 1 week and was on vacation. He says he ate poorly the entire time. He has also been out to eat with his wife for a celebration dinner and has fried seafood and cheesecake for dessert. He brought in his journal - 5 days in the past 3 weeks he hit his protein and calories. Otherwise, he was way over or way under in calories.  Current Meal Plan: keeping a food journal and adhering to recommended goals of 2000-2200 calories and 120+ grams of protein and following a lower carbohydrate, vegetable and lean protein rich diet plan for 50% of the time.  Current Exercise Plan: None at this time Current Anti-Obesity Medications: Ozempic 0.5 mg subcutaneously weekly. Side effects: None.  Assessment/Plan:   1. Type 2 diabetes mellitus with other specified complication, with long-term current use of insulin (HCC) Diabetes Mellitus: Not at goal. Medication: Ozempic 0.5 mg subcutaneously weekly and metformin 500 mg in the morning and 1,000 mg at night. Issues reviewed: blood sugar goals, complications of diabetes mellitus, hypoglycemia prevention and treatment, exercise, and nutrition. FBS have been very stable since starting Ozempic and his polyphagia has decreased. FBS 100's-120 at most even with when he had cheesecake the night before. Risk and benefits of medications were discussed today.   Plan: The patient was encouraged to monitor blood sugars regularly and to bring their log to the next appointment for review.   The importance of regular follow up with PCP and all other specialists as scheduled was stressed to patient today. The patient will continue to focus on protein-rich, low simple carbohydrate foods. We reviewed the importance of hydration, regular exercise for stress reduction, and restorative sleep.  Increase Ozempic 0.5 mg weekly to 1 mg weekly.   Lab Results  Component Value Date   HGBA1C 6.8 (H) 02/17/2021   HGBA1C 6.0 07/15/2015   Lab Results  Component Value Date   CREATININE 0.68 08/22/2017   - Start Semaglutide, 1 MG/DOSE, 4 MG/3ML SOPN; Inject 1 mg as directed once a week.  Dispense: 3 mL; Refill: 0  2. Constipation, unspecified constipation type Denies it being worse with taking Ozempic, perhaps a bit better as of lately. Used Doculax 2 times only in the past week. was informed that a decrease in bowel movement frequency is normal while losing weight, but stools should not be hard or painful.  Counseling: Getting to Good Bowel Health: Your goal is to have one soft bowel movement each day. Drink at least 8 glasses of water each day. Eat plenty of fiber (goal is over 30 grams each day). It is best to get most of your fiber from dietary sources which includes leafy green vegetables, fresh fruit, and whole grains. You may need to add fiber with the help of OTC fiber supplements. These include Metamucil, Citrucel, and Benefiber. If you are still having trouble, try adding an osmotic laxative such as Miralax. If all of these changes do not work, Cabin crew.   Plan: increase walking, increase water  intake; on low carb - eat 1 cup of high fiber beans per plan. Also take Miralax everyday and as needed.  3. Vitamin D deficiency Not at goal. Current vitamin D is 29.6, tested on 01/13/2021 with Dr. [place] Crane. Optimal goal > 50 ng/dL.  He is on supplements per his PCP.   Plan:  Continue supplement. Follow-up for routine testing of Vitamin D, at least every 3 months to avoid  over-replacement.  4. Other depression with emotional eating Not at goal. Medication: None.  Met with Dr. Mallie Mussel in past, no acute issues currently.   Plan: Behavior modification techniques were discussed today to help deal with emotional/non-hunger eating behaviors.  5. At risk for side effect of medication Due to Grayden's current conditions and medications, he is at a higher risk for drug side effect.  At least 10 minutes was spent on counseling him about these concerns today.  We discussed the benefits and potential risks of these medications, and all of patient's concerns were addressed and questions were answered.  he will call us, or their PCP or other specialists who treat their conditions with medications, with any questions or concerns that may develop.    6. Obesity with current BMI 34.3  Course: [name] is currently in the action stage of change. As such, his goal is to continue with weight loss efforts.   Nutrition goals: He has agreed to keeping a food journal and adhering to recommended goals of 2000-2200 calories and 120+ grams of protein and following a lower carbohydrate, vegetable and lean protein rich diet plan  Exercise goals: As is.  Behavioral modification strategies: decreasing simple carbohydrates, decreasing eating out, and celebration eating strategies. Advised on "low carb plan" to really try to avoid carbohydrates.  Rhonin has agreed to follow-up with our clinic in 3 weeks. He was informed of the importance of frequent follow-up visits to maximize his success with intensive lifestyle modifications for his multiple health conditions.   Objective:   Blood pressure 128/80, pulse 80, temperature 97.9 F (36.6 C), height '5\' 11"'  (1.803 m), weight 245 lb (111.1 kg), SpO2 96 %. Body mass index is 34.17 kg/m.  General: Cooperative, alert, well developed, in no acute distress. HEENT: Conjunctivae and lids unremarkable. Cardiovascular: Regular rhythm.  Lungs: Normal  work of breathing. Neurologic: No focal deficits.   Lab Results  Component Value Date   CREATININE 0.68 08/22/2017   BUN 10 08/22/2017   NA 139 08/22/2017   K 4.0 08/22/2017   CL 109 08/22/2017   CO2 24 08/22/2017   No results found for: ALT, AST, GGT, ALKPHOS, BILITOT Lab Results  Component Value Date   HGBA1C 6.8 (H) 02/17/2021   HGBA1C 6.0 07/15/2015   No results found for: INSULIN Lab Results  Component Value Date   TSH 1.470 02/17/2021   No results found for: CHOL, HDL, LDLCALC, LDLDIRECT, TRIG, CHOLHDL No results found for: VD25OH Lab Results  Component Value Date   WBC 8.8 08/22/2017   HGB 15.7 08/22/2017   HCT 45.3 08/22/2017   MCV 88.8 08/22/2017   PLT 259 08/22/2017   Attestation Statements:   Reviewed by clinician on day of visit: allergies, medications, problem list, medical history, surgical history, family history, social history, and previous encounter notes.  Leodis Binet Friedenbach, CMA, am acting as Location manager for [employer], DO.  I have reviewed the above documentation for accuracy and completeness, and I agree with the above. Marjory Sneddon, D.O.  The [place]  was signed into law in 2016 which includes the topic of electronic health records.  This provides immediate access to information in MyChart.  This includes consultation notes, operative notes, office notes, lab results and pathology reports.  If you have any questions about what you read please let us know at your next visit so we can discuss your concerns and take corrective action if need be.  We are right here with you.

## 2021-05-04 ENCOUNTER — Encounter (INDEPENDENT_AMBULATORY_CARE_PROVIDER_SITE_OTHER): Payer: Self-pay | Admitting: Family Medicine

## 2021-05-04 ENCOUNTER — Ambulatory Visit (INDEPENDENT_AMBULATORY_CARE_PROVIDER_SITE_OTHER): Payer: Medicare HMO | Admitting: Family Medicine

## 2021-05-04 ENCOUNTER — Other Ambulatory Visit: Payer: Self-pay

## 2021-05-04 VITALS — BP 121/79 | HR 91 | Temp 98.1°F | Ht 71.0 in | Wt 242.0 lb

## 2021-05-04 DIAGNOSIS — K5900 Constipation, unspecified: Secondary | ICD-10-CM | POA: Diagnosis not present

## 2021-05-04 DIAGNOSIS — Z6836 Body mass index (BMI) 36.0-36.9, adult: Secondary | ICD-10-CM

## 2021-05-04 DIAGNOSIS — Z794 Long term (current) use of insulin: Secondary | ICD-10-CM

## 2021-05-04 DIAGNOSIS — E1169 Type 2 diabetes mellitus with other specified complication: Secondary | ICD-10-CM

## 2021-05-05 ENCOUNTER — Ambulatory Visit (INDEPENDENT_AMBULATORY_CARE_PROVIDER_SITE_OTHER): Payer: Medicare HMO | Admitting: Nurse Practitioner

## 2021-05-05 ENCOUNTER — Encounter: Payer: Self-pay | Admitting: Nurse Practitioner

## 2021-05-05 VITALS — BP 128/78 | HR 73 | Temp 98.1°F | Resp 20 | Ht 71.0 in | Wt 247.0 lb

## 2021-05-05 DIAGNOSIS — Z6834 Body mass index (BMI) 34.0-34.9, adult: Secondary | ICD-10-CM | POA: Diagnosis not present

## 2021-05-05 DIAGNOSIS — E230 Hypopituitarism: Secondary | ICD-10-CM

## 2021-05-05 DIAGNOSIS — Z23 Encounter for immunization: Secondary | ICD-10-CM

## 2021-05-05 DIAGNOSIS — F3289 Other specified depressive episodes: Secondary | ICD-10-CM

## 2021-05-05 DIAGNOSIS — E559 Vitamin D deficiency, unspecified: Secondary | ICD-10-CM

## 2021-05-05 DIAGNOSIS — Z9989 Dependence on other enabling machines and devices: Secondary | ICD-10-CM

## 2021-05-05 DIAGNOSIS — Z794 Long term (current) use of insulin: Secondary | ICD-10-CM

## 2021-05-05 DIAGNOSIS — R69 Illness, unspecified: Secondary | ICD-10-CM | POA: Diagnosis not present

## 2021-05-05 DIAGNOSIS — G4733 Obstructive sleep apnea (adult) (pediatric): Secondary | ICD-10-CM | POA: Diagnosis not present

## 2021-05-05 DIAGNOSIS — E785 Hyperlipidemia, unspecified: Secondary | ICD-10-CM | POA: Diagnosis not present

## 2021-05-05 DIAGNOSIS — E291 Testicular hypofunction: Secondary | ICD-10-CM

## 2021-05-05 DIAGNOSIS — E1169 Type 2 diabetes mellitus with other specified complication: Secondary | ICD-10-CM

## 2021-05-05 NOTE — Progress Notes (Signed)
Chief Complaint:   OBESITY Roger Guerra is here to discuss his progress with his obesity treatment plan along with follow-up of his obesity related diagnoses. Roger Guerra is on keeping a food journal and adhering to recommended goals of 2000-2200 calories and 1200+ grams of protein daily or following a lower carbohydrate, vegetable and lean protein rich diet plan and states he is following his eating plan approximately 40% of the time. Roger Guerra states he is doing 0 minutes 0 times per week.  Today's visit was #: 5 Starting weight: 259 lbs Starting date: 02/17/2021 Today's weight: 242 lbs Today's date: 05/04/2021 Total lbs lost to date: 17 Total lbs lost since last in-office visit: 3  Interim History: Roger Guerra had a lot of personal challenges with family members the past 2 weeks (his daughter). He was unable to follow the plan and he did his best.  Subjective:   1. Type 2 diabetes mellitus with other specified complication, with long-term current use of insulin (Roger Guerra) Roger Guerra denies lows or highs. He is on metformin and Ozempic. Last a1c was 6.6 on 04/14/2021.  2. Constipation, unspecified constipation type Roger Guerra notes a slight increase in symptoms with increased dose of GLP, but he is having a BM.  Assessment/Plan:   Orders Placed This Encounter  Procedures   CBC and differential   CBC   Basic metabolic panel   Lipid panel   Hepatic function panel   Hemoglobin A1c    1. Type 2 diabetes mellitus with other specified complication, with long-term current use of insulin (HCC) [place] is stable. Good blood sugar control is important to decrease the likelihood of diabetic complications such as nephropathy, neuropathy, limb loss, blindness, coronary artery disease, and death. Intensive lifestyle modification including diet, exercise and weight loss are the first line of treatment for diabetes.   2. Constipation, unspecified constipation type Roger Guerra was informed that a decrease in bowel movement  frequency is normal while losing weight, but stools should not be hard or painful. OTC miralax and other medication supplements were discussed with the patient. Bowel hygiene education was done. Orders and follow up as documented in patient record.   Counseling Getting to Good Bowel Health: Your goal is to have one soft bowel movement each day. Drink at least 8 glasses of water each day. Eat plenty of fiber (goal is over 25 grams each day). It is best to get most of your fiber from dietary sources which includes leafy green vegetables, fresh fruit, and whole grains. You may need to add fiber with the help of OTC fiber supplements. These include Metamucil, Citrucel, and Flaxseed. If you are still having trouble, try adding Miralax or Magnesium Citrate. If all of these changes do not work, Cabin crew.  3. Obesity, with current BMI 33.8 Roger Guerra is currently in the action stage of change. As such, his goal is to continue with weight loss efforts. He has agreed to keeping a food journal and adhering to recommended goals of 2000-2200 calories and 120+ grams of protein daily or following a lower carbohydrate, vegetable and lean protein rich diet plan.   Exercise goals: All adults should avoid inactivity. Some physical activity is better than none, and adults who participate in any amount of physical activity gain some health benefits.  Behavioral modification strategies: meal planning and cooking strategies, keeping healthy foods in the home, and planning for success.  Roger Guerra has agreed to follow-up with our clinic in 3 weeks. He was informed of the importance of frequent  follow-up visits to maximize his success with intensive lifestyle modifications for his multiple health conditions.   Objective:   Blood pressure 121/79, pulse 91, temperature 98.1 F (36.7 C), height '5\' 11"'$  (1.803 m), weight 242 lb (109.8 kg), SpO2 97 %. Body mass index is 33.75 kg/m.  General: Cooperative, alert, well  developed, in no acute distress. HEENT: Conjunctivae and lids unremarkable. Cardiovascular: Regular rhythm.  Lungs: Normal work of breathing. Neurologic: No focal deficits.   Lab Results  Component Value Date   CREATININE 0.68 08/22/2017   BUN 10 08/22/2017   NA 139 08/22/2017   K 4.0 08/22/2017   CL 109 08/22/2017   CO2 24 08/22/2017   No results found for: ALT, AST, GGT, ALKPHOS, BILITOT Lab Results  Component Value Date   HGBA1C 6.8 (H) 02/17/2021   HGBA1C 6.0 07/15/2015   No results found for: INSULIN Lab Results  Component Value Date   TSH 1.470 02/17/2021   No results found for: CHOL, HDL, LDLCALC, LDLDIRECT, TRIG, CHOLHDL No results found for: VD25OH Lab Results  Component Value Date   WBC 8.8 08/22/2017   HGB 15.7 08/22/2017   HCT 45.3 08/22/2017   MCV 88.8 08/22/2017   PLT 259 08/22/2017   No results found for: IRON, TIBC, FERRITIN  Obesity Behavioral Intervention:   Approximately 15 minutes were spent on the discussion below.  ASK: We discussed the diagnosis of obesity with Roger Guerra today and Roger Guerra agreed to give [place] permission to discuss obesity behavioral modification therapy today.  ASSESS: Roger Guerra has the diagnosis of obesity and his BMI today is 33.77. Roger Guerra is in the action stage of change.   ADVISE: Roger Guerra was educated on the multiple health risks of obesity as well as the benefit of weight loss to improve his health. He was advised of the need for long term treatment and the importance of lifestyle modifications to improve his current health and to decrease his risk of future health problems.  AGREE: Multiple dietary modification options and treatment options were discussed and Roger Guerra agreed to follow the recommendations documented in the above note.  ARRANGE: Roger Guerra was educated on the importance of frequent visits to treat obesity as outlined per CMS and USPSTF guidelines and agreed to schedule his next follow up appointment  today.  Attestation Statements:   Reviewed by clinician on day of visit: allergies, medications, problem list, medical history, surgical history, family history, social history, and previous encounter notes.   Wilhemena Durie, am acting as transcriptionist for [employer], DO.  I have reviewed the above documentation for accuracy and completeness, and I agree with the above. Marjory Sneddon, D.O.  The [place] was signed into law in 2016 which includes the topic of electronic health records.  This provides immediate access to information in MyChart.  This includes consultation notes, operative notes, office notes, lab results and pathology reports.  If you have any questions about what you read please let us know at your next visit so we can discuss your concerns and take corrective action if need be.  We are right here with you.

## 2021-05-05 NOTE — Addendum Note (Signed)
Addended by: Rolena Infante on: 05/05/2021 03:10 PM   Modules accepted: Orders

## 2021-05-05 NOTE — Patient Instructions (Signed)

## 2021-05-05 NOTE — Progress Notes (Signed)
Subjective:    Patient ID: Roger Guerra, male    DOB: 27-Aug-1956, 65 y.o.   MRN: [id number]   Chief Complaint: Establish Care    HPI:  1. Hyperlipidemia associated with type 2 diabetes mellitus ([place]) Does try diet and exercise 2x a week. Total- 154 LDL- 105 HD- 35   2. Type 2 diabetes mellitus with other specified complication, with long-term current use of insulin (HCC) Is [facility] control. Was recently put on ozempic and has lost some weight. Blood sugars seem to be under better control. Fasting blood sugars are running from 101-118.4 GBA1c was 6.6 ( 04/17/21 )  3. Male hypogonadism Is on testosterone injection every 10 days.  Last testosterone was 902  4. OSA on CPAP Wears CPAP nightly. Feels rested in mornings. Sleeps about 6.5 hours a night  5. Other depression Says he does not feel depressed and is on no meds. Depression screen [facility] 2/9 05/05/2021 02/17/2021 07/24/2015  Decreased Interest 0 3 0  Down, Depressed, Hopeless 0 3 0  PHQ - 2 Score 0 6 0  Altered sleeping 0 2 -  Tired, decreased energy 0 3 -  Change in appetite 0 2 -  Feeling bad or failure about yourself  0 2 -  Trouble concentrating 0 2 -  Moving slowly or fidgety/restless 0 2 -  Suicidal thoughts 0 1 -  PHQ-9 Score 0 20 -  Difficult doing work/chores Not difficult at all Very difficult -      6. Vitamin D deficiency Takes a daily vitamin d supplement  7. BMI 33.0-33.9,adult Has lost 18 lbs in last several months Wt Readings from Last 3 Encounters:  05/05/21 247 lb (112 kg)  05/04/21 242 lb (109.8 kg)  04/13/21 245 lb (111.1 kg)   BMI Readings from Last 3 Encounters:  05/05/21 34.45 kg/m  05/04/21 33.75 kg/m  04/13/21 34.17 kg/m         Outpatient Encounter Medications as of 05/05/2021  Medication Sig   5-Hydroxytryptophan (5-HTP PO) Take by mouth.   Ascorbic Acid (VITAMIN C) 1000 MG tablet Take 1,000 mg by mouth daily.   aspirin 81 MG tablet Take  81 mg by mouth daily.   carisoprodol (SOMA) 350 MG tablet Take 350 mg 3 (three) times daily as needed by mouth for muscle spasms.   cetirizine (ZYRTEC) 10 MG tablet Take 10 mg by mouth every evening.    co-enzyme Q-10 30 MG capsule Take 30 mg by mouth daily.   ezetimibe (ZETIA) 10 MG tablet Take 1 tablet (10 mg total) by mouth daily.   fexofenadine (ALLEGRA) 180 MG tablet Take 180 mg by mouth every morning.    ibuprofen (ADVIL) 800 MG tablet Take 800 mg by mouth in the morning and at bedtime.   MAGNESIUM PO Take by mouth.   metFORMIN (GLUCOPHAGE) 500 MG tablet Take 500 mg by mouth as directed. Take 1 tablet in the morning and 2 tablets in the evening.   Multiple Vitamin (MULTIVITAMIN WITH MINERALS) TABS tablet Take 1 tablet by mouth daily.   Multiple Vitamins-Minerals (ZINC PO) Take by mouth.   Omega-3 Fatty Acids (FISH OIL PO) Take 1 capsule by mouth daily.   omeprazole (PRILOSEC OTC) 20 MG tablet Take 20 mg by mouth daily as needed (acid reflux).   PRESCRIPTION MEDICATION Inject 1 Dose as directed as needed (erectile dysfunction). Trimix Papaverine '30mg'$ /ml Phentolamine '1mg'$ /ml Prostaglandin 22mg/ml   Semaglutide, 1 MG/DOSE, 4 MG/3ML SOPN Inject 1 mg as  directed once a week.   sodium chloride (OCEAN) 0.65 % SOLN nasal spray Place 1 spray into both nostrils as needed for congestion.   testosterone cypionate (DEPOTESTOSTERONE CYPIONATE) 200 MG/ML injection Inject 200 mg into the muscle. Give 1 ml every 10 days   traMADol (ULTRAM) 50 MG tablet Take 50 mg by mouth every 6 (six) hours as needed.   vitamin E 400 UNIT capsule Take 400 Units by mouth daily.   No facility-administered encounter medications on file as of 05/05/2021.    Past Surgical History:  Procedure Laterality Date   BLADDER SURGERY  2015   removal of skin tag near urethra   COLONOSCOPY N/A 12/10/2016   Procedure: COLONOSCOPY;  Surgeon: Daneil Dolin, MD;  Location: [facility];  Service: Endoscopy;  Laterality: N/A;  8:30 AM    KNEE ARTHROSCOPY WITH MEDIAL MENISECTOMY Right 08/25/2017   Procedure: Right knee arthroscopy, partial medial menisectomy and debridement;  Surgeon: Susa Day, MD;  Location: [facility];  Service: Orthopedics;  Laterality: Right;  60 mins   POLYPECTOMY  12/10/2016   Procedure: POLYPECTOMY;  Surgeon: Daneil Dolin, MD;  Location: [facility];  Service: Endoscopy;;  hepatic flexure   TONSILLECTOMY      Family History  Problem Relation Age of Onset   Lung cancer Father    Heart disease Father    Stroke Father    Cancer Father    Diabetes Mother    Diabetes Other        Siblings   Diabetes Sister     New complaints: None today  Social history: Lives with wife  Controlled substance contract: n/a     Review of Systems  Constitutional:  Negative for diaphoresis.  Eyes:  Negative for pain.  Respiratory:  Negative for shortness of breath.   Cardiovascular:  Negative for chest pain, palpitations and leg swelling.  Gastrointestinal:  Negative for abdominal pain.  Endocrine: Negative for polydipsia.  Skin:  Negative for rash.  Neurological:  Negative for dizziness, weakness and headaches.  Hematological:  Does not bruise/bleed easily.  All other systems reviewed and are negative.     Objective:   Physical Exam Vitals and nursing note reviewed.  Constitutional:      Appearance: Normal appearance. He is well-developed.  HENT:     Head: Normocephalic.     Nose: Nose normal.  Eyes:     Pupils: Pupils are equal, round, and reactive to light.  Neck:     Thyroid: No thyroid mass or thyromegaly.     Vascular: No carotid bruit or JVD.     Trachea: Phonation normal.  Cardiovascular:     Rate and Rhythm: Normal rate and regular rhythm.  Pulmonary:     Effort: Pulmonary effort is normal. No respiratory distress.     Breath sounds: Normal breath sounds.  Abdominal:     General: Bowel sounds are normal.     Palpations: Abdomen is soft.     Tenderness: There is no  abdominal tenderness.  Musculoskeletal:        General: Normal range of motion.     Cervical back: Normal range of motion and neck supple.  Lymphadenopathy:     Cervical: No cervical adenopathy.  Skin:    General: Skin is warm and dry.  Neurological:     Mental Status: He is alert and oriented to person, place, and time.  Psychiatric:        Behavior: Behavior normal.  Thought Content: Thought content normal.        Judgment: Judgment normal.    BP 128/78   Pulse 73   Temp 98.1 F (36.7 C) (Temporal)   Resp 20   Ht '5\' 11"'$  (1.803 m)   Wt 247 lb (112 kg)   SpO2 95%   BMI 34.45 kg/m        Assessment & Plan:   Roger Guerra comes in today with chief complaint of Establish Care   Diagnosis and orders addressed:  1. Hyperlipidemia associated with type 2 diabetes mellitus (HCC) Low fat diet  2. Type 2 diabetes mellitus with other specified complication, with long-term current use of insulin (HCC) Continue to watch carbs in diet  3. Male hypogonadism Continue testosterone  4. OSA on CPAP Continue to wear CPAP nightly  5. Other depression Stress management  6. Vitamin D deficiency Continue daily vitamin d supplement  7. BMI 34.0-34.9,adult Discussed diet and exercise for person with BMI >25 Will recheck weight in 3-6 months    Labs reviewed Health Maintenance reviewed Diet and exercise encouraged  Follow up plan: 6 months   [place], FNP

## 2021-05-05 NOTE — Progress Notes (Unsigned)
  Office: [phone]  /  Fax: [phone]    Date: May 19, 2021   Appointment Start Time: *** Duration: *** minutes Provider: Glennie Isle, Psy.D. Type of Session: Individual Therapy  Location of Patient: {gbptloc:23249} Location of Provider: {Location of Service:22491} Type of Contact: Telepsychological Visit via {gbtelepsych:23399}  Session Content: Roger Guerra is a 65 y.o. male presenting for a follow-up appointment to address the previously established treatment goal of increasing coping skills. Today's appointment was a telepsychological visit due to COVID-19. [name] provided verbal consent for today's telepsychological appointment and he is aware he is responsible for securing confidentiality on his end of the session. Prior to proceeding with today's appointment, Termaine's physical location at the time of this appointment was obtained as well a phone number he could be reached at in the event of technical difficulties. [name] and this provider participated in today's telepsychological service.   This provider conducted a brief check-in. *** [name] was receptive to today's appointment as evidenced by openness to sharing, responsiveness to feedback, and {gbreceptiveness:23401}.  Mental Status Examination:  Appearance: {Appearance:22431} Behavior: {Behavior:22445} Mood: {gbmood:21757} Affect: {Affect:22436} Speech: {Speech:22432} Eye Contact: {Eye Contact:22433} Psychomotor Activity: {Motor Activity:22434} Gait: {gbgait:23404} Thought Process: {thought process:22448}  Thought Content/Perception: {disturbances:22451} Orientation: {Orientation:22437} Memory/Concentration: {gbcognition:22449} Insight/Judgment: {Insight:22446}  Interventions:  {Interventions for Progress Notes:23405}  DSM-5 Diagnosis(es): F32.89 Other Specified Depressive Disorder, Emotional Eating Behaviors  Treatment Goal & Progress: During the initial appointment with this provider, the following treatment  goal was established: increase coping skills. [name] has demonstrated progress in his goal as evidenced by {gbtxprogress:22839}. [name] also {gbtxprogress2:22951}.  Plan: The next appointment will be scheduled in {gbweeks:21758}, which will be {gbtxmodality:23402}. The next session will focus on {Plan for Next Appointment:23400}.

## 2021-05-05 NOTE — Addendum Note (Signed)
Addended by: Chevis Pretty on: 05/05/2021 10:25 AM   Modules accepted: Level of Service

## 2021-05-18 ENCOUNTER — Other Ambulatory Visit (INDEPENDENT_AMBULATORY_CARE_PROVIDER_SITE_OTHER): Payer: Self-pay

## 2021-05-18 ENCOUNTER — Encounter (INDEPENDENT_AMBULATORY_CARE_PROVIDER_SITE_OTHER): Payer: Self-pay

## 2021-05-18 DIAGNOSIS — M9901 Segmental and somatic dysfunction of cervical region: Secondary | ICD-10-CM | POA: Diagnosis not present

## 2021-05-18 DIAGNOSIS — M9902 Segmental and somatic dysfunction of thoracic region: Secondary | ICD-10-CM | POA: Diagnosis not present

## 2021-05-18 DIAGNOSIS — M9907 Segmental and somatic dysfunction of upper extremity: Secondary | ICD-10-CM | POA: Diagnosis not present

## 2021-05-18 DIAGNOSIS — E1169 Type 2 diabetes mellitus with other specified complication: Secondary | ICD-10-CM

## 2021-05-18 DIAGNOSIS — M546 Pain in thoracic spine: Secondary | ICD-10-CM | POA: Diagnosis not present

## 2021-05-18 DIAGNOSIS — Z794 Long term (current) use of insulin: Secondary | ICD-10-CM

## 2021-05-18 DIAGNOSIS — M542 Cervicalgia: Secondary | ICD-10-CM | POA: Diagnosis not present

## 2021-05-18 DIAGNOSIS — M7501 Adhesive capsulitis of right shoulder: Secondary | ICD-10-CM | POA: Diagnosis not present

## 2021-05-18 MED ORDER — SEMAGLUTIDE (1 MG/DOSE) 4 MG/3ML ~~LOC~~ SOPN: 1.0000 mg | PEN_INJECTOR | SUBCUTANEOUS | 0 refills | Status: DC

## 2021-05-18 NOTE — Telephone Encounter (Signed)
Mychart message sent to pt to find out when his next scheduled dose is supposed to be.

## 2021-05-18 NOTE — Telephone Encounter (Signed)
Pt called in and stated that he needs a refill on his Ozempic. Please advise

## 2021-05-18 NOTE — Telephone Encounter (Signed)
Pt states that he is scheduled to take his next dose of Ozempic on Saturday 05/23/2021. (Medication has been pended for approval or denial.)   LAST APPOINTMENT DATE: 05/04/2021 NEXT APPOINTMENT DATE: 05/24/2021   [facility], [place] [address] Phone: [phone] Fax: [phone]  Patient is requesting a refill of the following medications: Ozempic '1mg'$ /dose, '4mg'$ /38m. Inject '1MG'$  subcutaneously once weekly.   Date last filled: 04/13/2021 Previously prescribed by Dr. ORaliegh Scarlet Lab Results      Component                Value               Date                      HGBA1C                   6.6                 04/14/2021                HGBA1C                   6.8 (H)             02/17/2021                HGBA1C                   6.0                 07/15/2015           Lab Results      Component                Value               Date                      LDLCALC                  105                 04/14/2021                CREATININE               0.8                 04/14/2021           No results found for: VD25OH  BP Readings from Last 3 Encounters: 05/05/21 : 128/78 05/04/21 : 121/79 04/13/21 : 128/80

## 2021-05-19 ENCOUNTER — Encounter (INDEPENDENT_AMBULATORY_CARE_PROVIDER_SITE_OTHER): Payer: Self-pay

## 2021-05-19 ENCOUNTER — Telehealth (INDEPENDENT_AMBULATORY_CARE_PROVIDER_SITE_OTHER): Payer: Medicare HMO | Admitting: Psychology

## 2021-05-20 DIAGNOSIS — M542 Cervicalgia: Secondary | ICD-10-CM | POA: Diagnosis not present

## 2021-05-20 DIAGNOSIS — M9902 Segmental and somatic dysfunction of thoracic region: Secondary | ICD-10-CM | POA: Diagnosis not present

## 2021-05-20 DIAGNOSIS — M9907 Segmental and somatic dysfunction of upper extremity: Secondary | ICD-10-CM | POA: Diagnosis not present

## 2021-05-20 DIAGNOSIS — M7501 Adhesive capsulitis of right shoulder: Secondary | ICD-10-CM | POA: Diagnosis not present

## 2021-05-20 DIAGNOSIS — M546 Pain in thoracic spine: Secondary | ICD-10-CM | POA: Diagnosis not present

## 2021-05-20 DIAGNOSIS — M9901 Segmental and somatic dysfunction of cervical region: Secondary | ICD-10-CM | POA: Diagnosis not present

## 2021-05-25 ENCOUNTER — Ambulatory Visit (INDEPENDENT_AMBULATORY_CARE_PROVIDER_SITE_OTHER): Payer: Medicare HMO | Admitting: Family Medicine

## 2021-05-25 ENCOUNTER — Other Ambulatory Visit: Payer: Self-pay

## 2021-05-25 ENCOUNTER — Encounter (INDEPENDENT_AMBULATORY_CARE_PROVIDER_SITE_OTHER): Payer: Self-pay | Admitting: Family Medicine

## 2021-05-25 VITALS — BP 106/69 | HR 103 | Temp 98.1°F | Ht 71.0 in | Wt 241.0 lb

## 2021-05-25 DIAGNOSIS — Z6836 Body mass index (BMI) 36.0-36.9, adult: Secondary | ICD-10-CM

## 2021-05-25 DIAGNOSIS — E1169 Type 2 diabetes mellitus with other specified complication: Secondary | ICD-10-CM

## 2021-05-25 DIAGNOSIS — Z794 Long term (current) use of insulin: Secondary | ICD-10-CM | POA: Diagnosis not present

## 2021-05-25 MED ORDER — SEMAGLUTIDE (1 MG/DOSE) 4 MG/3ML ~~LOC~~ SOPN: 1.0000 mg | PEN_INJECTOR | SUBCUTANEOUS | 0 refills | Status: DC

## 2021-05-26 NOTE — Progress Notes (Signed)
Chief Complaint:   OBESITY Roger Guerra is here to discuss his progress with his obesity treatment plan along with follow-up of his obesity related diagnoses. Roger Guerra is on keeping a food journal and adhering to recommended goals of [phone] calories and 120 grams protein and following a lower carbohydrate, vegetable and lean protein rich diet plan and states he is following his eating plan approximately 30% of the time. Roger Guerra states he is not currently exercising.  Today's visit was #: 6 Starting weight: 259 lbs Starting date: 02/17/2021 Today's weight: 241 lbs Today's date: 05/25/2021 Total lbs lost to date: 18 Total lbs lost since last in-office visit: 1  Interim History: Roger Guerra had his 65th birthday with "celebration eating". With the carbs he ate, he found it difficult to get right back on plan the next couple of days. He has concerns with some constipation and not sure he's drinking enough eater.   Assessment/Plan:  No orders of the defined types were placed in this encounter.   Medications Discontinued During This Encounter  Medication Reason   Semaglutide, 1 MG/DOSE, 4 MG/3ML SOPN Reorder     Meds ordered this encounter  Medications   Semaglutide, 1 MG/DOSE, 4 MG/3ML SOPN    Sig: Inject 1 mg as directed once a week.    Dispense:  3 mL    Refill:  0    Ov for rf     1. Type 2 diabetes mellitus with other specified complication, with long-term current use of insulin ([place]) Diabetes Mellitus: At goal. Medication: Ozempic. Fasting blood sugars runs 99-126. Stable with no issues.   Plan: Continue Ozempic 1 mg.   Lab Results  Component Value Date   HGBA1C 6.6 04/14/2021   HGBA1C 6.8 (H) 02/17/2021   HGBA1C 6.0 07/15/2015   Lab Results  Component Value Date   LDLCALC 105 04/14/2021   CREATININE 0.8 04/14/2021   Refill- Semaglutide, 1 MG/DOSE, 4 MG/3ML SOPN; Inject 1 mg as directed once a week.  Dispense: 3 mL; Refill: 0  2. Obesity with current BMI of  33.6  Roger Guerra is currently in the action stage of change. As such, his goal is to continue with weight loss efforts. He has agreed to keeping a food journal and adhering to recommended goals of [phone] calories and 120 grams protein and following a lower carbohydrate, vegetable and lean protein rich diet plan.   Today pt brought journaling log in! Seven days he was under in proteins and calories. Roger Guerra rarely ever went over 2200 calories per day through which was great.  Exercise goals: All adults should avoid inactivity. Some physical activity is better than none, and adults who participate in any amount of physical activity gain some health benefits. Start walking 5-10 minutes per day.  Behavioral modification strategies: increasing lean protein intake, decreasing simple carbohydrates, and planning for success.  Roger Guerra has agreed to follow-up with our clinic in 3-4 weeks. He was informed of the importance of frequent follow-up visits to maximize his success with intensive lifestyle modifications for his multiple health conditions.   Objective:   Blood pressure 106/69, pulse (!) 103, temperature 98.1 F (36.7 C), height '5\' 11"'$  (1.803 m), weight 241 lb (109.3 kg), SpO2 96 %. Body mass index is 33.61 kg/m.  General: Cooperative, alert, well developed, in no acute distress. HEENT: Conjunctivae and lids unremarkable. Cardiovascular: Regular rhythm.  Lungs: Normal work of breathing. Neurologic: No focal deficits.   Lab Results  Component Value Date   CREATININE 0.8 04/14/2021  BUN 7 04/14/2021   NA 138 04/14/2021   K 5.0 04/14/2021   CL 101 04/14/2021   CO2 24 08/22/2017   Lab Results  Component Value Date   ALT 25 04/14/2021   AST 16 04/14/2021   ALKPHOS 45 04/14/2021   Lab Results  Component Value Date   HGBA1C 6.6 04/14/2021   HGBA1C 6.8 (H) 02/17/2021   HGBA1C 6.0 07/15/2015   No results found for: INSULIN Lab Results  Component Value Date   TSH 1.470 02/17/2021    Lab Results  Component Value Date   CHOL 154 04/14/2021   HDL 35 04/14/2021   LDLCALC 105 04/14/2021   TRIG 70 04/14/2021   No results found for: VD25OH Lab Results  Component Value Date   WBC 7.2 04/14/2021   HGB 17.0 04/14/2021   HCT 50 04/14/2021   MCV 88.8 08/22/2017   PLT 237 04/14/2021   No results found for: IRON, TIBC, FERRITIN  Obesity Behavioral Intervention:   Approximately 15 minutes were spent on the discussion below.  ASK: We discussed the diagnosis of obesity with Roger Guerra today and Roger Guerra agreed to give [place] permission to discuss obesity behavioral modification therapy today.  ASSESS: Roger Guerra has the diagnosis of obesity and his BMI today is 33.6. Roger Guerra is in the action stage of change.   ADVISE: Gurjot was educated on the multiple health risks of obesity as well as the benefit of weight loss to improve his health. He was advised of the need for long term treatment and the importance of lifestyle modifications to improve his current health and to decrease his risk of future health problems.  AGREE: Multiple dietary modification options and treatment options were discussed and Roger Guerra agreed to follow the recommendations documented in the above note.  ARRANGE: Roger Guerra was educated on the importance of frequent visits to treat obesity as outlined per CMS and USPSTF guidelines and agreed to schedule his next follow up appointment today.  Attestation Statements:   Reviewed by clinician on day of visit: allergies, medications, problem list, medical history, surgical history, family history, social history, and previous encounter notes.  Coral Ceo, CMA, am acting as transcriptionist for [employer], DO.  I have reviewed the above documentation for accuracy and completeness, and I agree with the above. Marjory Sneddon, D.O.  The [place] was signed into law in 2016 which includes the topic of electronic health records.  This provides  immediate access to information in MyChart.  This includes consultation notes, operative notes, office notes, lab results and pathology reports.  If you have any questions about what you read please let us know at your next visit so we can discuss your concerns and take corrective action if need be.  We are right here with you.

## 2021-05-27 DIAGNOSIS — M546 Pain in thoracic spine: Secondary | ICD-10-CM | POA: Diagnosis not present

## 2021-05-27 DIAGNOSIS — M9901 Segmental and somatic dysfunction of cervical region: Secondary | ICD-10-CM | POA: Diagnosis not present

## 2021-05-27 DIAGNOSIS — M9907 Segmental and somatic dysfunction of upper extremity: Secondary | ICD-10-CM | POA: Diagnosis not present

## 2021-05-27 DIAGNOSIS — M542 Cervicalgia: Secondary | ICD-10-CM | POA: Diagnosis not present

## 2021-05-27 DIAGNOSIS — M7501 Adhesive capsulitis of right shoulder: Secondary | ICD-10-CM | POA: Diagnosis not present

## 2021-05-27 DIAGNOSIS — M9902 Segmental and somatic dysfunction of thoracic region: Secondary | ICD-10-CM | POA: Diagnosis not present

## 2021-06-16 DIAGNOSIS — E559 Vitamin D deficiency, unspecified: Secondary | ICD-10-CM | POA: Diagnosis not present

## 2021-06-16 DIAGNOSIS — E785 Hyperlipidemia, unspecified: Secondary | ICD-10-CM | POA: Diagnosis not present

## 2021-06-16 DIAGNOSIS — Z79890 Hormone replacement therapy (postmenopausal): Secondary | ICD-10-CM | POA: Diagnosis not present

## 2021-06-16 DIAGNOSIS — Z5181 Encounter for therapeutic drug level monitoring: Secondary | ICD-10-CM | POA: Diagnosis not present

## 2021-06-17 ENCOUNTER — Encounter (INDEPENDENT_AMBULATORY_CARE_PROVIDER_SITE_OTHER): Payer: Self-pay | Admitting: Family Medicine

## 2021-06-17 ENCOUNTER — Ambulatory Visit (INDEPENDENT_AMBULATORY_CARE_PROVIDER_SITE_OTHER): Payer: Medicare HMO | Admitting: Family Medicine

## 2021-06-17 ENCOUNTER — Other Ambulatory Visit: Payer: Self-pay

## 2021-06-17 VITALS — BP 143/86 | HR 81 | Temp 97.8°F | Ht 71.0 in | Wt 251.0 lb

## 2021-06-17 DIAGNOSIS — Z794 Long term (current) use of insulin: Secondary | ICD-10-CM | POA: Diagnosis not present

## 2021-06-17 DIAGNOSIS — E1169 Type 2 diabetes mellitus with other specified complication: Secondary | ICD-10-CM | POA: Diagnosis not present

## 2021-06-17 DIAGNOSIS — Z6836 Body mass index (BMI) 36.0-36.9, adult: Secondary | ICD-10-CM

## 2021-06-17 MED ORDER — METFORMIN HCL 500 MG PO TABS: ORAL_TABLET | ORAL | 0 refills | Status: DC

## 2021-06-17 MED ORDER — GLUCOSE BLOOD VI STRP: ORAL_STRIP | 0 refills | Status: DC

## 2021-06-17 MED ORDER — SEMAGLUTIDE (1 MG/DOSE) 4 MG/3ML ~~LOC~~ SOPN: 1.0000 mg | PEN_INJECTOR | SUBCUTANEOUS | 0 refills | Status: DC

## 2021-06-17 NOTE — Progress Notes (Signed)
Chief Complaint:   OBESITY Roger Guerra is here to discuss his progress with his obesity treatment plan along with follow-up of his obesity related diagnoses. Roger Guerra is on keeping a food journal and adhering to recommended goals of 2000-2200 calories and 120 grams of protein daily and states he is following his eating plan approximately 20% of the time. Roger Guerra states he is doing 0 minutes 0 times per week.  Today's visit was #: 7 Starting weight: 259 lbs Starting date: 02/17/2021 Today's weight: 251 lbs Today's date: 06/17/2021 Total lbs lost to date: 8 Total lbs lost since last in-office visit: 0  Interim History: Roger Guerra was really sick the past 2 weeks with a sinus infection. He has been eating whatever tastes good and a lot more carbohydrates than usually.  Subjective:   1. Type 2 diabetes mellitus with other specified complication, with long-term current use of insulin (HCC) Roger Guerra's fasting BGs have been elevated since sick. His BGs range between 120-130's, and occasionally 140's. He denies complaints or symptoms.  Assessment/Plan:  No orders of the defined types were placed in this encounter.   Medications Discontinued During This Encounter  Medication Reason   Ascorbic Acid (VITAMIN C) 1000 MG tablet Error   MAGNESIUM PO Error   metFORMIN (GLUCOPHAGE) 500 MG tablet Reorder   Semaglutide, 1 MG/DOSE, 4 MG/3ML SOPN Reorder   glucose blood test strip Reorder     Meds ordered this encounter  Medications   Semaglutide, 1 MG/DOSE, 4 MG/3ML SOPN    Sig: Inject 1 mg as directed once a week.    Dispense:  3 mL    Refill:  0    Ov for rf   glucose blood test strip    Sig: Use OneTouch Ultra test strips to check blood sugar once daily. DX:E11.69    Dispense:  100 each    Refill:  0   metFORMIN (GLUCOPHAGE) 500 MG tablet    Sig: Take 1 tablet in the morning and 2 tablets in the evening.    Dispense:  270 tablet    Refill:  0     1. Type 2 diabetes mellitus with other  specified complication, with long-term current use of insulin (HCC) We will refill Ozempic and metformin for 1 month; Roger Guerra requests a 90 day supply of this). Good blood sugar control is important to decrease the likelihood of diabetic complications such as nephropathy, neuropathy, limb loss, blindness, coronary artery disease, and death. Intensive lifestyle modification including diet, exercise and weight loss are the first line of treatment for diabetes.   - Semaglutide, 1 MG/DOSE, 4 MG/3ML SOPN; Inject 1 mg as directed once a week.  Dispense: 3 mL; Refill: 0 - glucose blood test strip; Use OneTouch Ultra test strips to check blood sugar once daily. DX:E11.69  Dispense: 100 each; Refill: 0 - metFORMIN (GLUCOPHAGE) 500 MG tablet; Take 1 tablet in the morning and 2 tablets in the evening.  Dispense: 270 tablet; Refill: 0  2. Obesity with current BMI of 35.0 Roger Guerra is currently in the action stage of change. As such, his goal is to continue with weight loss efforts. He has agreed to the Category 4 Plan or keeping a food journal and adhering to recommended goals of 2000-2200 calories and 120 grams of protein daily.   Exercise goals: All adults should avoid inactivity. Some physical activity is better than none, and adults who participate in any amount of physical activity gain some health benefits.  Behavioral modification strategies: increasing  lean protein intake, decreasing simple carbohydrates, avoiding temptations, and planning for success.  Roger Guerra has agreed to follow-up with our clinic in 2 to 3 weeks. He was informed of the importance of frequent follow-up visits to maximize his success with intensive lifestyle modifications for his multiple health conditions.   Objective:   Blood pressure (!) 143/86, pulse 81, temperature 97.8 F (36.6 C), height 5\' 11"  (1.803 m), weight 251 lb (113.9 kg), SpO2 97 %. Body mass index is 35.01 kg/m.  General: Cooperative, alert, well developed, in no  acute distress. HEENT: Conjunctivae and lids unremarkable. Cardiovascular: Regular rhythm.  Lungs: Normal work of breathing. Neurologic: No focal deficits.   Lab Results  Component Value Date   CREATININE 0.8 04/14/2021   BUN 7 04/14/2021   NA 138 04/14/2021   K 5.0 04/14/2021   CL 101 04/14/2021   CO2 24 08/22/2017   Lab Results  Component Value Date   ALT 25 04/14/2021   AST 16 04/14/2021   ALKPHOS 45 04/14/2021   Lab Results  Component Value Date   HGBA1C 6.6 04/14/2021   HGBA1C 6.8 (H) 02/17/2021   HGBA1C 6.0 07/15/2015   No results found for: INSULIN Lab Results  Component Value Date   TSH 1.470 02/17/2021   Lab Results  Component Value Date   CHOL 154 04/14/2021   HDL 35 04/14/2021   LDLCALC 105 04/14/2021   TRIG 70 04/14/2021   Lab Results  Component Value Date   VD25OH 42.2 06/16/2021   Lab Results  Component Value Date   WBC 7.2 04/14/2021   HGB 17.0 04/14/2021   HCT 50 04/14/2021   MCV 88.8 08/22/2017   PLT 237 04/14/2021   No results found for: IRON, TIBC, FERRITIN  Obesity Behavioral Intervention:   Approximately 15 minutes were spent on the discussion below.  ASK: We discussed the diagnosis of obesity with Roger Guerra today and Roger Guerra agreed to give [place] permission to discuss obesity behavioral modification therapy today.  ASSESS: Roger Guerra has the diagnosis of obesity and his BMI today is 35.0. Roger Guerra is in the action stage of change.   ADVISE: Roger Guerra was educated on the multiple health risks of obesity as well as the benefit of weight loss to improve his health. He was advised of the need for long term treatment and the importance of lifestyle modifications to improve his current health and to decrease his risk of future health problems.  AGREE: Multiple dietary modification options and treatment options were discussed and Roger Guerra agreed to follow the recommendations documented in the above note.  ARRANGE: Roger Guerra was educated on the importance  of frequent visits to treat obesity as outlined per CMS and USPSTF guidelines and agreed to schedule his next follow up appointment today.  Attestation Statements:   Reviewed by clinician on day of visit: allergies, medications, problem list, medical history, surgical history, family history, social history, and previous encounter notes.   Wilhemena Durie, am acting as transcriptionist for [employer], DO.  I have reviewed the above documentation for accuracy and completeness, and I agree with the above. Roger Guerra, D.O.  The [place] was signed into law in 2016 which includes the topic of electronic health records.  This provides immediate access to information in MyChart.  This includes consultation notes, operative notes, office notes, lab results and pathology reports.  If you have any questions about what you read please let us know at your next visit so we can discuss  your concerns and take corrective action if need be.  We are right here with you.

## 2021-06-18 LAB — NMR, LIPOPROFILE
Cholesterol, Total: 148 mg/dL (ref 100–199)
HDL Particle Number: 29.4 umol/L — ABNORMAL LOW (ref >=30.5)
HDL-C: 34 mg/dL — ABNORMAL LOW (ref >39)
LDL Particle Number: 1278 nmol/L — ABNORMAL HIGH (ref <1000)
LDL Size: 20.5 nm — ABNORMAL LOW (ref >20.5)
LDL-C (NIH Calc): 98 mg/dL (ref 0–99)
LP-IR Score: 69 — ABNORMAL HIGH (ref <=45)
Small LDL Particle Number: 661 nmol/L — ABNORMAL HIGH (ref <=527)
Triglycerides: 81 mg/dL (ref 0–149)

## 2021-06-18 LAB — TESTOSTERONE,FREE AND TOTAL
Testosterone, Free: 15.8 pg/mL (ref 6.6–18.1)
Testosterone: 592 ng/dL (ref 264–916)

## 2021-06-18 LAB — VITAMIN D 25 HYDROXY (VIT D DEFICIENCY, FRACTURES): Vit D, 25-Hydroxy: 42.2 ng/mL (ref 30.0–100.0)

## 2021-06-23 ENCOUNTER — Ambulatory Visit (HOSPITAL_BASED_OUTPATIENT_CLINIC_OR_DEPARTMENT_OTHER): Payer: Medicare HMO | Admitting: Internal Medicine

## 2021-06-23 ENCOUNTER — Encounter (HOSPITAL_BASED_OUTPATIENT_CLINIC_OR_DEPARTMENT_OTHER): Payer: Self-pay | Admitting: Internal Medicine

## 2021-06-23 ENCOUNTER — Other Ambulatory Visit: Payer: Self-pay

## 2021-06-23 VITALS — BP 124/75 | HR 86 | Ht 71.0 in | Wt 254.6 lb

## 2021-06-23 DIAGNOSIS — E7849 Other hyperlipidemia: Secondary | ICD-10-CM

## 2021-06-23 DIAGNOSIS — E559 Vitamin D deficiency, unspecified: Secondary | ICD-10-CM | POA: Diagnosis not present

## 2021-06-23 DIAGNOSIS — I2510 Atherosclerotic heart disease of native coronary artery without angina pectoris: Secondary | ICD-10-CM

## 2021-06-23 DIAGNOSIS — R7989 Other specified abnormal findings of blood chemistry: Secondary | ICD-10-CM

## 2021-06-23 NOTE — Patient Instructions (Signed)
Medication Instructions:  Your physician recommends that you continue on your current medications as directed. Please refer to the Current Medication list given to you today.  *If you need a refill on your cardiac medications before your next appointment, please call your pharmacy*   Lab Work: FASTING lab work to check cholesterol in about 6 months -- complete one week before your next visit with Dr. Debara Pickett   If you have labs (blood work) drawn today and your tests are completely normal, you will receive your results only by: Grace (if you have MyChart) OR A paper copy in the mail If you have any lab test that is abnormal or we need to change your treatment, we will call you to review the results.   Testing/Procedures: NONE   Follow-Up: At [facility], you and your health needs are our priority.  As part of our continuing mission to provide you with exceptional heart care, we have created designated Provider Care Teams.  These Care Teams include your primary Cardiologist (physician) and Advanced Practice Providers (APPs -  Physician Assistants and Nurse Practitioners) who all work together to provide you with the care you need, when you need it.  We recommend signing up for the patient portal called "MyChart".  Sign up information is provided on this After Visit Summary.  MyChart is used to connect with patients for Virtual Visits (Telemedicine).  Patients are able to view lab/test results, encounter notes, upcoming appointments, etc.  Non-urgent messages can be sent to your provider as well.   To learn more about what you can do with MyChart, go to [email or web address].    Your next appointment:   6 month(s)  The format for your next appointment:   In Person  Provider:   K. Mali Hilty, MD   Other Instructions

## 2021-06-23 NOTE — Progress Notes (Signed)
LIPID CLINIC CONSULT NOTE  Chief Complaint:  Follow-up dyslipidemia  Primary Care Physician: Chevis Pretty, FNP  Primary Cardiologist:  Rozann Lesches, MD  HPI:  Roger Guerra is a 65 y.o. male who is being seen today for the evaluation of dyslipidemia at the request of Celene Squibb, MD. Roger Guerra returns today for follow-up of dyslipidemia.  He is continue to work on diet, weight loss and lifestyle modification.  He is being seen in [facility] weight loss clinic.  His cholesterol has come down further including with the addition of ezetimibe.  Total cholesterol now 148, HDL 34, triglycerides 81 and LDL 98.  I am trying to target his LDL to below 70.  LDL particle numbers were 1278 with a small LDL-P of 661, persistently elevated.  Recent testing of his testosterone show that free and total testosterone are within therapeutic range and his vitamin D is normal on treatment at 42.  PMHx:  Past Medical History:  Diagnosis Date   Anxiety disorder    Arthritis    Atherosclerosis    Back pain    Constipation    Coronary artery calcification seen on CT scan    Depression    Diabetes (HCC)    Erectile dysfunction    GERD (gastroesophageal reflux disease)    Heartburn    History of blood clots    History of DVT (deep vein thrombosis)    History of DVT of lower extremity    Right leg 2002    History of pneumonia    Hyperlipidemia    Hypogonadism in male    Hypopituitarism ([place])    Joint pain    Nerve root and plexus disorder    Obesity    Obstructive sleep apnea    OSA on CPAP    Osteoarthritis    Sciatica    Sleep apnea    SOBOE (shortness of breath on exertion)    Spinal stenosis    Spondylosis    Swelling of both lower extremities    Type 2 diabetes mellitus ([place])    Vitamin D deficiency     Past Surgical History:  Procedure Laterality Date   BLADDER SURGERY  2015   removal of skin tag near urethra   COLONOSCOPY N/A 12/10/2016   Procedure: COLONOSCOPY;   Surgeon: Daneil Dolin, MD;  Location: [facility];  Service: Endoscopy;  Laterality: N/A;  8:30 AM   KNEE ARTHROSCOPY WITH MEDIAL MENISECTOMY Right 08/25/2017   Procedure: Right knee arthroscopy, partial medial menisectomy and debridement;  Surgeon: Susa Day, MD;  Location: [facility];  Service: Orthopedics;  Laterality: Right;  60 mins   POLYPECTOMY  12/10/2016   Procedure: POLYPECTOMY;  Surgeon: Daneil Dolin, MD;  Location: [facility];  Service: Endoscopy;;  hepatic flexure   TONSILLECTOMY      FAMHx:  Family History  Problem Relation Age of Onset   Lung cancer Father    Heart disease Father    Stroke Father    Cancer Father    Diabetes Mother    Diabetes Other        Siblings   Diabetes Sister     SOCHx:   reports that he has quit smoking. His smoking use included cigarettes. He has a 37.50 pack-year smoking history. He has never used smokeless tobacco. He reports that he does not drink alcohol and does not use drugs.  ALLERGIES:  Allergies  Allergen Reactions   Invokana [Canagliflozin] Other (See Comments)  Yeast infection    ROS: Pertinent items noted in HPI and remainder of comprehensive ROS otherwise negative.  HOME MEDS: Current Outpatient Medications on File Prior to Visit  Medication Sig Dispense Refill   5-Hydroxytryptophan (5-HTP PO) Take by mouth.     aspirin 81 MG tablet Take 81 mg by mouth daily.     carisoprodol (SOMA) 350 MG tablet Take 350 mg 3 (three) times daily as needed by mouth for muscle spasms.     cetirizine (ZYRTEC) 10 MG tablet Take 10 mg by mouth every evening.      co-enzyme Q-10 30 MG capsule Take 30 mg by mouth daily.     fexofenadine (ALLEGRA) 180 MG tablet Take 180 mg by mouth every morning.      glucose blood test strip Use OneTouch Ultra test strips to check blood sugar once daily. DX:E11.69 100 each 0   ibuprofen (ADVIL) 800 MG tablet Take 800 mg by mouth in the morning and at bedtime.     metFORMIN (GLUCOPHAGE) 500 MG  tablet Take 1 tablet in the morning and 2 tablets in the evening. 270 tablet 0   Multiple Vitamin (MULTIVITAMIN WITH MINERALS) TABS tablet Take 1 tablet by mouth daily.     Multiple Vitamins-Minerals (ZINC PO) Take by mouth.     Omega-3 Fatty Acids (FISH OIL PO) Take 1 capsule by mouth daily.     omeprazole (PRILOSEC OTC) 20 MG tablet Take 20 mg by mouth daily as needed (acid reflux).     PRESCRIPTION MEDICATION Inject 1 Dose as directed as needed (erectile dysfunction). Trimix Papaverine 30mg /ml Phentolamine 1mg /ml Prostaglandin 38mcg/ml     Semaglutide, 1 MG/DOSE, 4 MG/3ML SOPN Inject 1 mg as directed once a week. 3 mL 0   sodium chloride (OCEAN) 0.65 % SOLN nasal spray Place 1 spray into both nostrils as needed for congestion.     testosterone cypionate (DEPOTESTOSTERONE CYPIONATE) 200 MG/ML injection Inject 200 mg into the muscle. Give 1 ml every 10 days     traMADol (ULTRAM) 50 MG tablet Take 50 mg by mouth every 6 (six) hours as needed.     vitamin E 400 UNIT capsule Take 400 Units by mouth daily.     ezetimibe (ZETIA) 10 MG tablet Take 1 tablet (10 mg total) by mouth daily. 90 tablet 3   No current facility-administered medications on file prior to visit.    LABS/IMAGING: No results found for this or any previous visit (from the past 48 hour(s)). No results found.  LIPID PANEL:    Component Value Date/Time   CHOL 154 04/14/2021 0000   TRIG 70 04/14/2021 0000   HDL 35 04/14/2021 0000   LDLCALC 105 04/14/2021 0000    WEIGHTS: Wt Readings from Last 3 Encounters:  06/23/21 254 lb 9.6 oz (115.5 kg)  06/17/21 251 lb (113.9 kg)  05/25/21 241 lb (109.3 kg)    VITALS: BP 124/75   Pulse 86   Ht 5\' 11"  (1.803 m)   Wt 254 lb 9.6 oz (115.5 kg)   SpO2 98%   BMI 35.51 kg/m   EXAM: Deferred  EKG: Deferred  ASSESSMENT: Mixed dyslipidemia, metabolic pattern Low testosterone Vitamin D deficiency Hypopituitarism (selective, not on steroids) Coronary artery  calcification/atherosclerosis Family history of heart disease and stroke  PLAN: 1.   Roger Guerra has had some incremental improvement in his lipids however LDL-P and small LDL-P remain above target.  Would recommend additional therapy.  We discussed the possibility of adding bempedoic acid as  a noninjectable option for him.  This may get him to target.  He was somewhat reluctant to add additional medication at this time and would like to continue to try to work on optimizing diet and weight loss.  Based on this we will repeat lipids in about 6 months and consider further therapy based on those findings.  Pixie Casino, MD, [facility], [place] Director of the Advanced Lipid Disorders &  Cardiovascular Risk Reduction Clinic Diplomate of the American Board of Clinical Lipidology Attending Cardiologist  Direct Dial: [phone]  Fax: [phone]  Website:  [email or web address] 06/23/2021, 5:13 PM

## 2021-06-25 ENCOUNTER — Telehealth: Payer: Self-pay | Admitting: Nurse Practitioner

## 2021-06-25 NOTE — Telephone Encounter (Signed)
[facility] [place] needs new rx for cpap supplies.

## 2021-06-25 NOTE — Telephone Encounter (Signed)
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 She gave me his CPAP settings, she will also fax [place] his sleep study for our records since he is a new patient.  Please sign pending orders for supplies for me to fax.

## 2021-07-01 ENCOUNTER — Other Ambulatory Visit: Payer: Self-pay

## 2021-07-01 ENCOUNTER — Ambulatory Visit (INDEPENDENT_AMBULATORY_CARE_PROVIDER_SITE_OTHER): Payer: Medicare HMO | Admitting: Family Medicine

## 2021-07-01 ENCOUNTER — Encounter (INDEPENDENT_AMBULATORY_CARE_PROVIDER_SITE_OTHER): Payer: Self-pay | Admitting: Family Medicine

## 2021-07-01 VITALS — BP 142/79 | HR 86 | Temp 97.9°F | Ht 71.0 in | Wt 252.0 lb

## 2021-07-01 DIAGNOSIS — R030 Elevated blood-pressure reading, without diagnosis of hypertension: Secondary | ICD-10-CM | POA: Diagnosis not present

## 2021-07-01 DIAGNOSIS — Z6836 Body mass index (BMI) 36.0-36.9, adult: Secondary | ICD-10-CM

## 2021-07-01 DIAGNOSIS — Z794 Long term (current) use of insulin: Secondary | ICD-10-CM

## 2021-07-01 DIAGNOSIS — E1169 Type 2 diabetes mellitus with other specified complication: Secondary | ICD-10-CM

## 2021-07-01 MED ORDER — SEMAGLUTIDE (1 MG/DOSE) 4 MG/3ML ~~LOC~~ SOPN: 1.0000 mg | PEN_INJECTOR | SUBCUTANEOUS | 0 refills | Status: DC

## 2021-07-01 NOTE — Progress Notes (Signed)
Chief Complaint:   OBESITY Roger Guerra is here to discuss his progress with his obesity treatment plan along with follow-up of his obesity related diagnoses. Roger Guerra is on the Category 4 Plan, keeping a food journal and adhering to recommended goals of 2000-2200 calories and 120 grams of protein and Keto and states he is following his eating plan approximately 20% of the time. Roger Guerra states he is swimming for 45 minutes 1 times per week.  Today's visit was #: 8 Starting weight: 259 lbs Starting date: 02/17/2021 Today's weight: 252 lbs Today's date: 07/01/2021 Total lbs lost to date: 7 lbs Total lbs lost since last in-office visit: 0  Interim History: He has been dealing with some personal issues with his daughter which have prohibited him from concentrating on the meal plan.  Roger Guerra feels that a low carb plan has been the thing that works best for him in the past. He says Roger Guerra diet (from [facility]) works very well for him.  Subjective:   1. Type 2 diabetes mellitus with other specified complication, with long-term current use of insulin (HCC) Roger Guerra's diabetes is well controlled. His A1C is 6.6. He is on Ozempic and Metformin.  Lab Results  Component Value Date   HGBA1C 6.6 04/14/2021   HGBA1C 6.8 (H) 02/17/2021   HGBA1C 6.0 07/15/2015   Lab Results  Component Value Date   LDLCALC 105 04/14/2021   CREATININE 0.8 04/14/2021   No results found for: INSULIN   2. Elevated BP without diagnosis of hypertension Roger Guerra's blood pressure is slightly elevated today 142/79. He has no diagnosis of HTN. BP Readings from Last 3 Encounters:  07/01/21 (!) 142/79  06/23/21 124/75  06/17/21 (!) 143/86     Assessment/Plan:   1. Type 2 diabetes mellitus with other specified complication, with long-term current use of insulin (Roger Guerra) Roger Guerra will continue Ozempic 1 mg weekly. We will refill Ozempic today.  - Semaglutide, 1 MG/DOSE, 4 MG/3ML SOPN; Inject 1 mg as directed  once a week.  Dispense: 3 mL; Refill: 0  2. Elevated BP without diagnosis of hypertension Continue to monitor. May need medication if BPs continue to be elevated.   3. Obesity with current BMI of 35.16 Roger Guerra is currently in the action stage of change. As such, his goal is to continue with weight loss efforts using a low carb plan. He may use Roger Guerra Keto plan.  Roger Guerra will stay well hydrated and replace electrolytes with Gatorade O or broth. He will make sure to eat vegetables while doing the keto plan.  Exercise goals:  As is.  Behavioral modification strategies: decreasing simple carbohydrates and increasing water intake.  Roger Guerra has agreed to follow-up with our clinic in 3 weeks with Roger Guerra.  Objective:   Blood pressure (!) 142/79, pulse 86, temperature 97.9 F (36.6 C), height 5\' 11"  (1.803 m), weight 252 lb (114.3 kg), SpO2 97 %. Body mass index is 35.15 kg/m.  General: Cooperative, alert, well developed, in no acute distress. HEENT: Conjunctivae and lids unremarkable. Cardiovascular: Regular rhythm.  Lungs: Normal work of breathing. Neurologic: No focal deficits.   Lab Results  Component Value Date   CREATININE 0.8 04/14/2021   BUN 7 04/14/2021   NA 138 04/14/2021   K 5.0 04/14/2021   CL 101 04/14/2021   CO2 24 08/22/2017   Lab Results  Component Value Date   ALT 25 04/14/2021   AST 16 04/14/2021   ALKPHOS 45 04/14/2021   Lab Results  Component Value Date   HGBA1C 6.6 04/14/2021   HGBA1C 6.8 (H) 02/17/2021   HGBA1C 6.0 07/15/2015   No results found for: INSULIN Lab Results  Component Value Date   TSH 1.470 02/17/2021   Lab Results  Component Value Date   CHOL 154 04/14/2021   HDL 35 04/14/2021   LDLCALC 105 04/14/2021   TRIG 70 04/14/2021   Lab Results  Component Value Date   VD25OH 42.2 06/16/2021   Lab Results  Component Value Date   WBC 7.2 04/14/2021   HGB 17.0 04/14/2021   HCT 50 04/14/2021   MCV 88.8 08/22/2017   PLT 237  04/14/2021   No results found for: IRON, TIBC, FERRITIN  Obesity Behavioral Intervention:   Approximately 15 minutes were spent on the discussion below.  ASK: We discussed the diagnosis of obesity with Roger Guerra today and Roger Guerra agreed to give [place] permission to discuss obesity behavioral modification therapy today.  ASSESS: Roger Guerra has the diagnosis of obesity and his BMI today is 35.2. Roger Guerra is in the action stage of change.   ADVISE: Roger Guerra was educated on the multiple health risks of obesity as well as the benefit of weight loss to improve his health. He was advised of the need for long term treatment and the importance of lifestyle modifications to improve his current health and to decrease his risk of future health problems.  AGREE: Multiple dietary modification options and treatment options were discussed and Roger Guerra agreed to follow the recommendations documented in the above note.  ARRANGE: Roger Guerra was educated on the importance of frequent visits to treat obesity as outlined per CMS and USPSTF guidelines and agreed to schedule his next follow up appointment today.  Attestation Statements:   Reviewed by clinician on day of visit: allergies, medications, problem list, medical history, surgical history, family history, social history, and previous encounter notes.  I, Lizbeth Bark, RMA, am acting as Location manager for [employer], [place].   I have reviewed the above documentation for accuracy and completeness, and I agree with the above. -  Georgianne Fick, FNP

## 2021-07-20 ENCOUNTER — Ambulatory Visit (INDEPENDENT_AMBULATORY_CARE_PROVIDER_SITE_OTHER): Payer: Medicare HMO | Admitting: Family Medicine

## 2021-07-20 ENCOUNTER — Other Ambulatory Visit (INDEPENDENT_AMBULATORY_CARE_PROVIDER_SITE_OTHER): Payer: Self-pay | Admitting: Family Medicine

## 2021-07-20 ENCOUNTER — Encounter (INDEPENDENT_AMBULATORY_CARE_PROVIDER_SITE_OTHER): Payer: Self-pay

## 2021-07-20 DIAGNOSIS — E1169 Type 2 diabetes mellitus with other specified complication: Secondary | ICD-10-CM

## 2021-07-20 MED ORDER — SEMAGLUTIDE (1 MG/DOSE) 4 MG/3ML ~~LOC~~ SOPN: 1.0000 mg | PEN_INJECTOR | SUBCUTANEOUS | 0 refills | Status: DC

## 2021-07-20 NOTE — Telephone Encounter (Signed)
LAST APPOINTMENT DATE: 07/01/21 NEXT APPOINTMENT DATE: 08/10/21 was today with Dr Raliegh Scarlet, but she is out of the office,had to cancel.   [place], [place] [address] Phone: [phone] Fax: [phone]  Patient is requesting a refill of the following medications: Requested Prescriptions   Pending Prescriptions Disp Refills   Semaglutide, 1 MG/DOSE, 4 MG/3ML SOPN 3 mL 0    Sig: Inject 1 mg as directed once a week.    Date last filled: 07/01/21 Previously [facility] by [facility]  Lab Results  Component Value Date   HGBA1C 6.6 04/14/2021   HGBA1C 6.8 (H) 02/17/2021   HGBA1C 6.0 07/15/2015   Lab Results  Component Value Date   LDLCALC 105 04/14/2021   CREATININE 0.8 04/14/2021   Lab Results  Component Value Date   VD25OH 42.2 06/16/2021    BP Readings from Last 3 Encounters:  07/01/21 (!) 142/79  06/23/21 124/75  06/17/21 (!) 143/86

## 2021-07-20 NOTE — Telephone Encounter (Signed)
Last OV with Dawn 

## 2021-08-10 ENCOUNTER — Ambulatory Visit (INDEPENDENT_AMBULATORY_CARE_PROVIDER_SITE_OTHER): Payer: Medicare HMO | Admitting: Family Medicine

## 2021-08-11 ENCOUNTER — Other Ambulatory Visit: Payer: Self-pay

## 2021-08-11 ENCOUNTER — Encounter (INDEPENDENT_AMBULATORY_CARE_PROVIDER_SITE_OTHER): Payer: Self-pay | Admitting: Family Medicine

## 2021-08-11 ENCOUNTER — Ambulatory Visit (INDEPENDENT_AMBULATORY_CARE_PROVIDER_SITE_OTHER): Payer: Medicare HMO | Admitting: Family Medicine

## 2021-08-11 VITALS — BP 136/77 | HR 85 | Temp 97.6°F | Ht 71.0 in | Wt 251.0 lb

## 2021-08-11 DIAGNOSIS — E785 Hyperlipidemia, unspecified: Secondary | ICD-10-CM

## 2021-08-11 DIAGNOSIS — Z794 Long term (current) use of insulin: Secondary | ICD-10-CM

## 2021-08-11 DIAGNOSIS — Z6836 Body mass index (BMI) 36.0-36.9, adult: Secondary | ICD-10-CM

## 2021-08-11 DIAGNOSIS — E1169 Type 2 diabetes mellitus with other specified complication: Secondary | ICD-10-CM

## 2021-08-11 MED ORDER — GLUCOSE BLOOD VI STRP: ORAL_STRIP | 0 refills | Status: DC

## 2021-08-11 MED ORDER — SEMAGLUTIDE (1 MG/DOSE) 4 MG/3ML ~~LOC~~ SOPN: 1.0000 mg | PEN_INJECTOR | SUBCUTANEOUS | 0 refills | Status: DC

## 2021-08-11 MED ORDER — METFORMIN HCL 500 MG PO TABS: ORAL_TABLET | ORAL | 0 refills | Status: DC

## 2021-08-11 NOTE — Progress Notes (Signed)
The 10-year ASCVD risk score (Arnett DK, et al., 2019) is: 33.7%*   Values used to calculate the score:     Age: 65 years     Sex: Male     Is Non-Hispanic African American: No     Diabetic: Yes     Tobacco smoker: Yes     Systolic Blood Pressure: 069 mmHg     Is BP treated: No     HDL Cholesterol: 35 mg/dL*     Total Cholesterol: 148 mg/dL     * - Cholesterol units were assumed for this score calculation

## 2021-08-12 ENCOUNTER — Encounter (INDEPENDENT_AMBULATORY_CARE_PROVIDER_SITE_OTHER): Payer: Self-pay | Admitting: Family Medicine

## 2021-08-12 DIAGNOSIS — Z6836 Body mass index (BMI) 36.0-36.9, adult: Secondary | ICD-10-CM | POA: Insufficient documentation

## 2021-08-12 NOTE — Progress Notes (Signed)
Chief Complaint:   OBESITY Roger Guerra is here to discuss his progress with his obesity treatment plan along with follow-up of his obesity related diagnoses. Roger Guerra is on following a lower carbohydrate, vegetable and lean protein rich diet plan and states he is following his eating plan approximately 20% of the time. Roger Guerra states he is walking for 10-20 minutes 7 times per week.  Today's visit was #: 9 Starting weight: 259 lbs Starting date: 02/17/2021 Today's weight: 251 lbs Today's date: 08/11/2021 Total lbs lost to date: 8 lbs Total lbs lost since last in-office visit: 1 lb  Interim History: Roger Guerra's wife was hospitalized recently for heart arrhythmia secondary to a new medication. He has not had the mental energy to do keto but is still down 1 lb. He generally follows Dr. Gaylord Shih (works at [facility]) keto plan. He has cut out coffee creamer; he had been drinking 1 pint per day.  He said he needs to ease back into Keto and plans on being back on keto fully by December 9th.  He has not exercised recently. He usually swims.   Subjective:   1. Other hyperlipidemia Roger Guerra has not tolerated statins in the past. His Cardiologist wants to put him on Lipid lowering medication, he is unsure of the name. He was told his hyperlipidemia is genetic. He is on Zetia but not a statin.  The 10-year ASCVD risk score (Arnett DK, et al., 2019) is: 33.7%*   Values used to calculate the score:     Age: 65 years     Sex: Male     Is Non-Hispanic African American: No     Diabetic: Yes     Tobacco smoker: Yes     Systolic Blood Pressure: 062 mmHg     Is BP treated: No     HDL Cholesterol: 35 mg/dL*     Total Cholesterol: 148 mg/dL     * - Cholesterol units were assumed for this score calculation  2. Type 2 diabetes mellitus with other specified complication, with long-term current use of insulin (Roger Guerra) DM well controlled. Roger Guerra notes substantial appetite suppression with Ozempic. However, he may  go into doughnut hole and may not be able to refill due to cost.  His fasting blood glucose ranges in the 130s (higher than usual). He denies hypoglycemia.   Lab Results  Component Value Date   HGBA1C 6.6 04/14/2021   HGBA1C 6.8 (H) 02/17/2021   HGBA1C 6.0 07/15/2015   Lab Results  Component Value Date   LDLCALC 105 04/14/2021   CREATININE 0.8 04/14/2021   No results found for: INSULIN   Assessment/Plan:   1. Other hyperlipidemia  Roger Guerra was advised that he should consider starting the medication recommended by Cardiology for Lipid management.    2. Type 2 diabetes mellitus with other specified complication, with long-term current use of insulin (Roger Guerra) We will refill Ozempic 1 mg weekly. We will refill Metformin and test strips.  - Semaglutide, 1 MG/DOSE, 4 MG/3ML SOPN; Inject 1 mg as directed once a week.  Dispense: 3 mL; Refill: 0 - metFORMIN (GLUCOPHAGE) 500 MG tablet; Take 1 tablet in the morning and 2 tablets in the evening.  Dispense: 270 tablet; Refill: 0 - glucose blood test strip; Use OneTouch Ultra test strips to check blood sugar once daily. DX:E11.69  Dispense: 100 each; Refill: 0  3. Obesity with current BMI of 35.02 Roger Guerra is currently in the action stage of change. As such, his goal is to continue  with weight loss efforts. He has agreed to following a lower carbohydrate, vegetable and lean protein rich diet plan. He is doing Roger Guerra.  Handouts: Holiday strategies were provided.  Exercise goals:  Roger Guerra will try to get back to swimming.  Behavioral modification strategies: decreasing simple carbohydrates.  Roger Guerra has agreed to follow-up with our clinic in 3-4 weeks.  Objective:   Blood pressure 136/77, pulse 85, temperature 97.6 F (36.4 C), height 5\' 11"  (1.803 m), weight 251 lb (113.9 kg), SpO2 97 %. Body mass index is 35.01 kg/m.  General: Cooperative, alert, well developed, in no acute distress. HEENT: Conjunctivae and lids  unremarkable. Cardiovascular: Regular rhythm.  Lungs: Normal work of breathing. Neurologic: No focal deficits.   Lab Results  Component Value Date   CREATININE 0.8 04/14/2021   BUN 7 04/14/2021   NA 138 04/14/2021   K 5.0 04/14/2021   CL 101 04/14/2021   CO2 24 08/22/2017   Lab Results  Component Value Date   ALT 25 04/14/2021   AST 16 04/14/2021   ALKPHOS 45 04/14/2021   Lab Results  Component Value Date   HGBA1C 6.6 04/14/2021   HGBA1C 6.8 (H) 02/17/2021   HGBA1C 6.0 07/15/2015   No results found for: INSULIN Lab Results  Component Value Date   TSH 1.470 02/17/2021   Lab Results  Component Value Date   CHOL 154 04/14/2021   HDL 35 04/14/2021   LDLCALC 105 04/14/2021   TRIG 70 04/14/2021   Lab Results  Component Value Date   VD25OH 42.2 06/16/2021   Lab Results  Component Value Date   WBC 7.2 04/14/2021   HGB 17.0 04/14/2021   HCT 50 04/14/2021   MCV 88.8 08/22/2017   PLT 237 04/14/2021   No results found for: IRON, TIBC, FERRITIN  Attestation Statements:   Reviewed by clinician on day of visit: allergies, medications, problem list, medical history, surgical history, family history, social history, and previous encounter notes.  I, Lizbeth Bark, RMA, am acting as Location manager for [employer], [place].   I have reviewed the above documentation for accuracy and completeness, and I agree with the above. -  Georgianne Fick, FNP

## 2021-09-04 DIAGNOSIS — M546 Pain in thoracic spine: Secondary | ICD-10-CM | POA: Diagnosis not present

## 2021-09-04 DIAGNOSIS — M542 Cervicalgia: Secondary | ICD-10-CM | POA: Diagnosis not present

## 2021-09-04 DIAGNOSIS — M9901 Segmental and somatic dysfunction of cervical region: Secondary | ICD-10-CM | POA: Diagnosis not present

## 2021-09-04 DIAGNOSIS — M9902 Segmental and somatic dysfunction of thoracic region: Secondary | ICD-10-CM | POA: Diagnosis not present

## 2021-09-08 ENCOUNTER — Other Ambulatory Visit: Payer: Self-pay

## 2021-09-08 ENCOUNTER — Encounter (INDEPENDENT_AMBULATORY_CARE_PROVIDER_SITE_OTHER): Payer: Self-pay | Admitting: Family Medicine

## 2021-09-08 ENCOUNTER — Ambulatory Visit (INDEPENDENT_AMBULATORY_CARE_PROVIDER_SITE_OTHER): Payer: Medicare HMO | Admitting: Family Medicine

## 2021-09-08 VITALS — BP 129/81 | HR 89 | Temp 97.9°F | Ht 71.0 in | Wt 259.0 lb

## 2021-09-08 DIAGNOSIS — Z6836 Body mass index (BMI) 36.0-36.9, adult: Secondary | ICD-10-CM | POA: Diagnosis not present

## 2021-09-08 DIAGNOSIS — E1169 Type 2 diabetes mellitus with other specified complication: Secondary | ICD-10-CM

## 2021-09-08 DIAGNOSIS — Z794 Long term (current) use of insulin: Secondary | ICD-10-CM

## 2021-09-08 DIAGNOSIS — E785 Hyperlipidemia, unspecified: Secondary | ICD-10-CM | POA: Diagnosis not present

## 2021-09-08 MED ORDER — SEMAGLUTIDE (2 MG/DOSE) 8 MG/3ML ~~LOC~~ SOPN: 2.0000 mg | PEN_INJECTOR | SUBCUTANEOUS | 0 refills | Status: DC

## 2021-09-09 NOTE — Progress Notes (Signed)
Chief Complaint:   OBESITY Roger Guerra is here to discuss his progress with his obesity treatment plan along with follow-up of his obesity related diagnoses. Roger Guerra is on following a lower carbohydrate, vegetable and lean protein rich diet plan and states he is following his eating plan approximately 20% of the time. Roger Guerra states he is doing 0 minutes 0 times per week.  Today's visit was #: 10 Starting weight: 259 lbs Starting date: 02/17/2021 Today's weight: 259 lbs Today's date: 09/08/2021 Total lbs lost to date: 0 Total lbs lost since last in-office visit: 0  Interim History: Roger Guerra has not been able to adhere well to plan due to various stressors in his life. He wants to get back on Dr. Veronia Beets plan which is the only diet that has worked for him. He drinks 2 pots of coffee per day with heavy cream.  Subjective:   1. Type 2 diabetes mellitus with other specified complication, with long-term current use of insulin (HCC) Roger Guerra's CBG's have been as high as 202 in the AM. He admits to having candy (candy bars). His last A1C was at goal 6.6. He is currently on Ozempic 1 mg and Metformin.   Lab Results  Component Value Date   HGBA1C 6.6 04/14/2021   HGBA1C 6.8 (H) 02/17/2021   HGBA1C 6.0 07/15/2015   Lab Results  Component Value Date   LDLCALC 105 04/14/2021   CREATININE 0.8 04/14/2021   No results found for: INSULIN   2. Hyperlipidemia associated with type 2 diabetes mellitus ([place]) Roger Guerra does not tolerate statins. He is currently on Zetia 10 mg. His HDL is low. His LDL was not at goal.   Lab Results  Component Value Date   CHOL 154 04/14/2021   HDL 35 04/14/2021   LDLCALC 105 04/14/2021   TRIG 70 04/14/2021   Lab Results  Component Value Date   ALT 25 04/14/2021   AST 16 04/14/2021   ALKPHOS 45 04/14/2021   The 10-year ASCVD risk score (Arnett DK, et al., 2019) is: 31.2%*   Values used to calculate the score:     Age: 65 years     Sex: Male     Is  Non-Hispanic African American: No     Diabetic: Yes     Tobacco smoker: Yes     Systolic Blood Pressure: 694 mmHg     Is BP treated: No     HDL Cholesterol: 35 mg/dL*     Total Cholesterol: 148 mg/dL     * - Cholesterol units were assumed for this score calculation   Assessment/Plan:   1. Type 2 diabetes mellitus with other specified complication, with long-term current use of insulin ([place]) Roger Guerra agrees to increase dose of Ozempic to 2 mg  - Semaglutide, 2 MG/DOSE, 8 MG/3ML SOPN; Inject 2 mg as directed once a week.  Dispense: 3 mL; Refill: 0  2. Hyperlipidemia associated with type 2 diabetes mellitus ([place])  We discussed several lifestyle modifications today and Roger Guerra will continue to work on diet, exercise and weight loss efforts. Roger Guerra will continue Zetia. We will check labs at next office visit.   3. Obesity with current BMI of 35.14 Roger Guerra is currently in the action stage of change. As such, his goal is to continue with weight loss efforts. Roger Guerra is following Keto- Roger Guerra (Bariatrician at [facility])  Roger Guerra will reduce coffee with creamer to 2 cups of coffee per day.   Exercise goals: No exercise has been prescribed at  this time.  Behavioral modification strategies: decreasing simple carbohydrates.  Roger Guerra has agreed to follow-up with our clinic in 4 weeks.  Objective:   Blood pressure 129/81, pulse 89, temperature 97.9 F (36.6 C), height 5\' 11"  (1.803 m), weight 259 lb (117.5 kg), SpO2 97 %. Body mass index is 36.12 kg/m.  General: Cooperative, alert, well developed, in no acute distress. HEENT: Conjunctivae and lids unremarkable. Cardiovascular: Regular rhythm.  Lungs: Normal work of breathing. Neurologic: No focal deficits.   Lab Results  Component Value Date   CREATININE 0.8 04/14/2021   BUN 7 04/14/2021   NA 138 04/14/2021   K 5.0 04/14/2021   CL 101 04/14/2021   CO2 24 08/22/2017   Lab Results  Component Value Date   ALT 25 04/14/2021   AST 16  04/14/2021   ALKPHOS 45 04/14/2021   Lab Results  Component Value Date   HGBA1C 6.6 04/14/2021   HGBA1C 6.8 (H) 02/17/2021   HGBA1C 6.0 07/15/2015   No results found for: INSULIN Lab Results  Component Value Date   TSH 1.470 02/17/2021   Lab Results  Component Value Date   CHOL 154 04/14/2021   HDL 35 04/14/2021   LDLCALC 105 04/14/2021   TRIG 70 04/14/2021   Lab Results  Component Value Date   VD25OH 42.2 06/16/2021   Lab Results  Component Value Date   WBC 7.2 04/14/2021   HGB 17.0 04/14/2021   HCT 50 04/14/2021   MCV 88.8 08/22/2017   PLT 237 04/14/2021   No results found for: IRON, TIBC, FERRITIN  Attestation Statements:   Reviewed by clinician on day of visit: allergies, medications, problem list, medical history, surgical history, family history, social history, and previous encounter notes.  I, Lizbeth Bark, RMA, am acting as Location manager for [employer], [place].   I have reviewed the above documentation for accuracy and completeness, and I agree with the above. -  Georgianne Fick, FNP

## 2021-09-11 ENCOUNTER — Telehealth: Payer: Self-pay | Admitting: Nurse Practitioner

## 2021-10-02 ENCOUNTER — Ambulatory Visit (INDEPENDENT_AMBULATORY_CARE_PROVIDER_SITE_OTHER): Payer: Medicare HMO | Admitting: Nurse Practitioner

## 2021-10-02 ENCOUNTER — Encounter: Payer: Self-pay | Admitting: Nurse Practitioner

## 2021-10-02 VITALS — BP 124/81 | HR 93 | Temp 98.2°F | Resp 20 | Ht 71.0 in | Wt 264.0 lb

## 2021-10-02 DIAGNOSIS — Z0000 Encounter for general adult medical examination without abnormal findings: Secondary | ICD-10-CM

## 2021-10-02 DIAGNOSIS — Z23 Encounter for immunization: Secondary | ICD-10-CM | POA: Diagnosis not present

## 2021-10-02 NOTE — Progress Notes (Signed)
Subjective:   Roger Guerra is a 66 y.o. male who presents for a Welcome to Medicare exam.   Review of Systems: Review of Systems  Constitutional:  Negative for diaphoresis and weight loss.  Eyes:  Negative for blurred vision, double vision and pain.  Respiratory:  Negative for shortness of breath.   Cardiovascular:  Negative for chest pain, palpitations, orthopnea and leg swelling.  Gastrointestinal:  Negative for abdominal pain.  Skin:  Negative for rash.  Neurological:  Negative for dizziness, sensory change, loss of consciousness, weakness and headaches.  Endo/Heme/Allergies:  Negative for polydipsia. Does not bruise/bleed easily.  Psychiatric/Behavioral:  Negative for memory loss. The patient does not have insomnia.   All other systems reviewed and are negative.        Objective:    Today's Vitals   10/02/21 1129  BP: 124/81  Pulse: 93  Resp: 20  Temp: 98.2 F (36.8 C)  TempSrc: Temporal  SpO2: 97%  Weight: 264 lb (119.7 kg)  Height: 5\' 11"  (1.803 m)   Body mass index is 36.82 kg/m.  Medications Outpatient Encounter Medications as of 10/02/2021  Medication Sig   5-Hydroxytryptophan (5-HTP PO) Take by mouth.   aspirin 81 MG tablet Take 81 mg by mouth daily.   carisoprodol (SOMA) 350 MG tablet Take 350 mg 3 (three) times daily as needed by mouth for muscle spasms.   cetirizine (ZYRTEC) 10 MG tablet Take 10 mg by mouth every evening.    co-enzyme Q-10 30 MG capsule Take 30 mg by mouth daily.   fexofenadine (ALLEGRA) 180 MG tablet Take 180 mg by mouth every morning.    glucose blood test strip Use OneTouch Ultra test strips to check blood sugar once daily. DX:E11.69   ibuprofen (ADVIL) 800 MG tablet Take 800 mg by mouth in the morning and at bedtime.   metFORMIN (GLUCOPHAGE) 500 MG tablet Take 1 tablet in the morning and 2 tablets in the evening.   Multiple Vitamin (MULTIVITAMIN WITH MINERALS) TABS tablet Take 1 tablet by mouth daily.   Multiple Vitamins-Minerals  (ZINC PO) Take by mouth.   Omega-3 Fatty Acids (FISH OIL PO) Take 1 capsule by mouth daily.   omeprazole (PRILOSEC OTC) 20 MG tablet Take 20 mg by mouth daily as needed (acid reflux).   PRESCRIPTION MEDICATION Inject 1 Dose as directed as needed (erectile dysfunction). Trimix Papaverine 30mg /ml Phentolamine 1mg /ml Prostaglandin 29mcg/ml   Semaglutide, 2 MG/DOSE, 8 MG/3ML SOPN Inject 2 mg as directed once a week.   sodium chloride (OCEAN) 0.65 % SOLN nasal spray Place 1 spray into both nostrils as needed for congestion.   testosterone cypionate (DEPOTESTOSTERONE CYPIONATE) 200 MG/ML injection Inject 200 mg into the muscle. Give 1 ml every 10 days   traMADol (ULTRAM) 50 MG tablet Take 50 mg by mouth every 6 (six) hours as needed.   vitamin E 400 UNIT capsule Take 400 Units by mouth daily.   ezetimibe (ZETIA) 10 MG tablet Take 1 tablet (10 mg total) by mouth daily.   No facility-administered encounter medications on file as of 10/02/2021.     History: Past Medical History:  Diagnosis Date   Anxiety disorder    Arthritis    Atherosclerosis    Back pain    Constipation    Coronary artery calcification seen on CT scan    Depression    Diabetes (HCC)    Erectile dysfunction    GERD (gastroesophageal reflux disease)    Heartburn    History of blood  clots    History of DVT (deep vein thrombosis)    History of DVT of lower extremity    Right leg 2002    History of pneumonia    Hyperlipidemia    Hypogonadism in male    Hypopituitarism [facility])    Joint pain    Nerve root and plexus disorder    Obesity    Obstructive sleep apnea    OSA on CPAP    Osteoarthritis    Sciatica    Sleep apnea    SOBOE (shortness of breath on exertion)    Spinal stenosis    Spondylosis    Swelling of both lower extremities    Type 2 diabetes mellitus (HCC)    Vitamin D deficiency    Past Surgical History:  Procedure Laterality Date   BLADDER SURGERY  2015   removal of skin tag near urethra    COLONOSCOPY N/A 12/10/2016   Procedure: COLONOSCOPY;  Surgeon: Daneil Dolin, MD;  Location: [facility];  Service: Endoscopy;  Laterality: N/A;  8:30 AM   KNEE ARTHROSCOPY WITH MEDIAL MENISECTOMY Right 08/25/2017   Procedure: Right knee arthroscopy, partial medial menisectomy and debridement;  Surgeon: Susa Day, MD;  Location: [facility];  Service: Orthopedics;  Laterality: Right;  60 mins   POLYPECTOMY  12/10/2016   Procedure: POLYPECTOMY;  Surgeon: Daneil Dolin, MD;  Location: [facility];  Service: Endoscopy;;  hepatic flexure   TONSILLECTOMY      Family History  Problem Relation Age of Onset   Diabetes Mother    Lung cancer Father    Heart disease Father    Stroke Father    Cancer Father    Diabetes Sister    Diabetes Other        Siblings   Social History   Occupational History   Occupation: Retired Pharmacist, 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  Tobacco Use   Smoking status: Former    Packs/day: 2.50    Years: 15.00    Pack years: 37.50    Types: Cigarettes   Smokeless tobacco: Never  Vaping Use   Vaping Use: Never used  Substance and Sexual Activity   Alcohol use: No    Alcohol/week: 0.0 standard drinks   Drug use: No   Sexual activity: Not on file   Tobacco Counseling Not needed- former smoker   Immunizations and Health Maintenance Immunization History  Administered Date(s) Administered   Fluad Quad(high Dose 65+) 08/14/2021   Influenza,inj,Quad PF,6+ Mos 07/28/2015   Moderna SARS-COV2 Booster Vaccination 01/15/2020, 08/28/2020   Moderna Sars-Covid-2 Vaccination 12/15/2019   Tdap 05/05/2021   Zoster Recombinat (Shingrix) 05/10/2017, 07/16/2017   Health Maintenance Due  Topic Date Due   URINE MICROALBUMIN  Never done   HIV Screening  Never done   Hepatitis C Screening  Never done    Activities of Daily Living In your present state of health, do you have any difficulty performing the following activities: 10/02/2021  Hearing? N  Vision? N  Difficulty  concentrating or making decisions? N  Walking or climbing stairs? N  Dressing or bathing? N  Doing errands, shopping? N  Some recent data might be hidden    Physical Exam  (optional), or other factors deemed appropriate based on the beneficiary's medical and social history and current clinical standards.  Advanced Directives: Does Patient Have a Medical Advance Directive?: No Would patient like information on creating a medical advance directive?: No - Patient declined    Assessment:  This is a routine wellness  examination for this patient . Welcome to medicare visit  Vision/Hearing screen Has yearly eye exams  Dietary issues and exercise activities discussed:      Goals      DIET - EAT MORE FRUITS AND VEGETABLES     Exercise 150 min/wk Moderate Activity        Depression Screen PHQ 2/9 Scores 10/02/2021 05/05/2021 02/17/2021 07/24/2015  PHQ - 2 Score 0 0 6 0  PHQ- 9 Score 2 0 20 -  Exception Documentation - Other- indicate reason in comment box - -  Not completed - Patient is seeing a specialist for this - -     Fall Risk Fall Risk  10/02/2021  Falls in the past year? 0  Number falls in past yr: -  Injury with Fall? -  Risk for fall due to : -  Follow up -    Cognitive Function MMSE - Mini Mental State Exam 10/02/2021  Orientation to time 5  Orientation to Place 5  Registration 3  Attention/ Calculation 5  Recall 3  Language- name 2 objects 2  Language- repeat 1  Language- follow 3 step command 3  Language- read & follow direction 1  Write a sentence 1  Copy design 1  Total score 30        Patient Care Team: Chevis Pretty, FNP as PCP - General (Family Medicine) Satira Sark, MD as PCP - Cardiology (Cardiology) Sadie Haber, MD as Referring Physician (Urology)     Plan:   Welcome to medicare   I have personally reviewed and noted the following in the patients chart:   Medical and social history Use of alcohol, tobacco or illicit  drugs  Current medications and supplements Functional ability and status Nutritional status Physical activity Advanced directives List of other physicians Hospitalizations, surgeries, and ER visits in previous 12 months Vitals Screenings to include cognitive, depression, and falls Referrals and appointments  In addition, I have reviewed and discussed with patient certain preventive protocols, quality metrics, and best practice recommendations. A written personalized care plan for preventive services as well as general preventive health recommendations were provided to patient.    [place], [place] 10/02/2021

## 2021-10-06 ENCOUNTER — Encounter (INDEPENDENT_AMBULATORY_CARE_PROVIDER_SITE_OTHER): Payer: Self-pay | Admitting: Family Medicine

## 2021-10-06 ENCOUNTER — Other Ambulatory Visit: Payer: Self-pay

## 2021-10-06 ENCOUNTER — Ambulatory Visit (INDEPENDENT_AMBULATORY_CARE_PROVIDER_SITE_OTHER): Payer: Medicare HMO | Admitting: Family Medicine

## 2021-10-06 VITALS — BP 119/79 | HR 85 | Temp 98.0°F | Ht 71.0 in | Wt 257.0 lb

## 2021-10-06 DIAGNOSIS — E1169 Type 2 diabetes mellitus with other specified complication: Secondary | ICD-10-CM

## 2021-10-06 DIAGNOSIS — Z6835 Body mass index (BMI) 35.0-35.9, adult: Secondary | ICD-10-CM

## 2021-10-06 DIAGNOSIS — E559 Vitamin D deficiency, unspecified: Secondary | ICD-10-CM | POA: Diagnosis not present

## 2021-10-06 DIAGNOSIS — Z6836 Body mass index (BMI) 36.0-36.9, adult: Secondary | ICD-10-CM

## 2021-10-06 DIAGNOSIS — Z794 Long term (current) use of insulin: Secondary | ICD-10-CM | POA: Diagnosis not present

## 2021-10-06 MED ORDER — METFORMIN HCL 500 MG PO TABS: ORAL_TABLET | ORAL | 0 refills | Status: DC

## 2021-10-06 MED ORDER — SEMAGLUTIDE (2 MG/DOSE) 8 MG/3ML ~~LOC~~ SOPN: 2.0000 mg | PEN_INJECTOR | SUBCUTANEOUS | 0 refills | Status: DC

## 2021-10-07 ENCOUNTER — Encounter (INDEPENDENT_AMBULATORY_CARE_PROVIDER_SITE_OTHER): Payer: Self-pay | Admitting: Family Medicine

## 2021-10-07 LAB — COMPREHENSIVE METABOLIC PANEL
ALT: 42 IU/L (ref 0–44)
AST: 34 IU/L (ref 0–40)
Albumin/Globulin Ratio: 1.7 (ref 1.2–2.2)
Albumin: 4.5 g/dL (ref 3.8–4.8)
Alkaline Phosphatase: 52 IU/L (ref 44–121)
BUN/Creatinine Ratio: 11 (ref 10–24)
BUN: 10 mg/dL (ref 8–27)
Bilirubin Total: 0.4 mg/dL (ref 0.0–1.2)
CO2: 21 mmol/L (ref 20–29)
Calcium: 9.8 mg/dL (ref 8.6–10.2)
Chloride: 101 mmol/L (ref 96–106)
Creatinine, Ser: 0.88 mg/dL (ref 0.76–1.27)
Globulin, Total: 2.7 g/dL (ref 1.5–4.5)
Glucose: 142 mg/dL — ABNORMAL HIGH (ref 70–99)
Potassium: 5.1 mmol/L (ref 3.5–5.2)
Sodium: 140 mmol/L (ref 134–144)
Total Protein: 7.2 g/dL (ref 6.0–8.5)
eGFR: 95 mL/min/{1.73_m2} (ref >59)

## 2021-10-07 LAB — VITAMIN D 25 HYDROXY (VIT D DEFICIENCY, FRACTURES): Vit D, 25-Hydroxy: 33.6 ng/mL (ref 30.0–100.0)

## 2021-10-07 LAB — HEMOGLOBIN A1C
Est. average glucose Bld gHb Est-mCnc: 192 mg/dL
Hgb A1c MFr Bld: 8.3 % — ABNORMAL HIGH (ref 4.8–5.6)

## 2021-10-07 NOTE — Progress Notes (Signed)
Chief Complaint:   OBESITY Roger Guerra is here to discuss his progress with his obesity treatment plan along with follow-up of his obesity related diagnoses. Roger Guerra is on Keto Bariatric plan and following a lower carbohydrate, vegetable and lean protein rich diet plan and states he is following his eating plan approximately 30-40% of the time. Roger Guerra states he is swimming for 60 minutes 3 times per week.  Today's visit was #: 11 Starting weight: 259 lbs Starting date: 02/17/2021 Today's weight: 257 lbs Today's date: 10/06/2021 Total lbs lost to date: 2 lbs Total lbs lost since last in-office visit: 2 lbs  Interim History: [name] has been hit or miss with low carb. He is still drinking a lot of half and half in his coffee. He drinks 2 pots per day.   He started back to water aerobics.   Subjective:   1. Type 2 diabetes mellitus with other specified complication, without long-term current use of insulin (HCC) Roger Guerra's Type 2 diabetes mellitus is well controlled. His last A1C was 6.6. He is on Metformin and Ozempic 2 mg.(Dose increase last OV). His CBGs average 185. He denies hypoglycemia. He has mild nausea from increased dose of Ozempic.   Lab Results  Component Value Date   HGBA1C 8.3 (H) 10/06/2021   HGBA1C 6.6 04/14/2021   HGBA1C 6.8 (H) 02/17/2021   Lab Results  Component Value Date   LDLCALC 105 04/14/2021   CREATININE 0.88 10/06/2021   No results found for: INSULIN  2. Vitamin D deficiency Roger Guerra's Vitamin D is slightly low at 42.2. He is on weekly prescription Vitamin D. He is on OTC Vitamin D.  Lab Results  Component Value Date   VD25OH 33.6 10/06/2021   VD25OH 42.2 06/16/2021    Assessment/Plan:   1. Type 2 diabetes mellitus with other specified complication, without long-term current use of insulin (HCC) We will refill Metformin today. Carnie agrees to take 1 tablet in the AM and 2 tablets in the afternoon. We will refill Ozempic 2 mg weekly.  We will check A1C  today.  - metFORMIN (GLUCOPHAGE) 500 MG tablet; Take 1 tablet in the morning and 2 tablets in the evening.  Dispense: 270 tablet; Refill: 0 - Semaglutide, 2 MG/DOSE, 8 MG/3ML SOPN; Inject 2 mg as directed once a week.  Dispense: 3 mL; Refill: 0  - Hemoglobin A1c  2. Vitamin D deficiency We will check Vitamin D today. Roger Guerra agrees to continue to take OTC Vitamin D  and he will follow-up for routine testing of Vitamin D, at least 2-3 times per year to avoid over-replacement.  - VITAMIN D 25 Hydroxy (Vit-D Deficiency, Fractures)  3. Obesity with current BMI of 35.86 [name] is currently in the action stage of change. As such, his goal is to continue with weight loss efforts. He has agreed to following a lower carbohydrate, vegetable and lean protein rich diet plan and Dr. Cannon Kettle Plan/Keto. Dr. Leeanne Rio is a bariatrician at [facility] that Roger Guerra has seen in the past.   Roger Guerra will drink broth and or Gatorade zero to maintain electrolytes while on Keto.  Exercise goals:  As is.  Behavioral modification strategies: decreasing simple carbohydrates.  Isacc has agreed to follow-up with our clinic in 3 weeks with myself and 6 weeks with Dr. Owens Shark.  Objective:   Blood pressure 119/79, pulse 85, temperature 98 F (36.7 C), height 5\' 11"  (1.803 m), weight 257 lb (116.6 kg), SpO2 94 %. Body mass index is 35.84 kg/m.  General:  Cooperative, alert, well developed, in no acute distress. HEENT: Conjunctivae and lids unremarkable. Cardiovascular: Regular rhythm.  Lungs: Normal work of breathing. Neurologic: No focal deficits.   Lab Results  Component Value Date   CREATININE 0.88 10/06/2021   BUN 10 10/06/2021   NA 140 10/06/2021   K 5.1 10/06/2021   CL 101 10/06/2021   CO2 21 10/06/2021   Lab Results  Component Value Date   ALT 42 10/06/2021   AST 34 10/06/2021   ALKPHOS 52 10/06/2021   BILITOT 0.4 10/06/2021   Lab Results  Component Value Date   HGBA1C 8.3 (H) 10/06/2021   HGBA1C 6.6  04/14/2021   HGBA1C 6.8 (H) 02/17/2021   HGBA1C 6.0 07/15/2015   No results found for: INSULIN Lab Results  Component Value Date   TSH 1.470 02/17/2021   Lab Results  Component Value Date   CHOL 154 04/14/2021   HDL 35 04/14/2021   LDLCALC 105 04/14/2021   TRIG 70 04/14/2021   Lab Results  Component Value Date   VD25OH 33.6 10/06/2021   VD25OH 42.2 06/16/2021   Lab Results  Component Value Date   WBC 7.2 04/14/2021   HGB 17.0 04/14/2021   HCT 50 04/14/2021   MCV 88.8 08/22/2017   PLT 237 04/14/2021   No results found for: IRON, TIBC, FERRITIN  Attestation Statements:   Reviewed by clinician on day of visit: allergies, medications, problem list, medical history, surgical history, family history, social history, and previous encounter notes.  I, Lizbeth Bark, RMA, am acting as Location manager for [employer], [place].  I have reviewed the above documentation for accuracy and completeness, and I agree with the above. -  Georgianne Fick, FNP

## 2021-10-21 DIAGNOSIS — M9901 Segmental and somatic dysfunction of cervical region: Secondary | ICD-10-CM | POA: Diagnosis not present

## 2021-10-21 DIAGNOSIS — M542 Cervicalgia: Secondary | ICD-10-CM | POA: Diagnosis not present

## 2021-10-21 DIAGNOSIS — M9902 Segmental and somatic dysfunction of thoracic region: Secondary | ICD-10-CM | POA: Diagnosis not present

## 2021-10-21 DIAGNOSIS — M546 Pain in thoracic spine: Secondary | ICD-10-CM | POA: Diagnosis not present

## 2021-10-23 DIAGNOSIS — M9902 Segmental and somatic dysfunction of thoracic region: Secondary | ICD-10-CM | POA: Diagnosis not present

## 2021-10-23 DIAGNOSIS — M9901 Segmental and somatic dysfunction of cervical region: Secondary | ICD-10-CM | POA: Diagnosis not present

## 2021-10-23 DIAGNOSIS — M542 Cervicalgia: Secondary | ICD-10-CM | POA: Diagnosis not present

## 2021-10-23 DIAGNOSIS — M546 Pain in thoracic spine: Secondary | ICD-10-CM | POA: Diagnosis not present

## 2021-10-27 ENCOUNTER — Ambulatory Visit (INDEPENDENT_AMBULATORY_CARE_PROVIDER_SITE_OTHER): Payer: Medicare HMO | Admitting: Family Medicine

## 2021-10-27 ENCOUNTER — Encounter (INDEPENDENT_AMBULATORY_CARE_PROVIDER_SITE_OTHER): Payer: Self-pay | Admitting: Family Medicine

## 2021-10-27 ENCOUNTER — Other Ambulatory Visit: Payer: Self-pay

## 2021-10-27 VITALS — BP 118/72 | HR 83 | Temp 98.2°F | Ht 71.0 in | Wt 257.0 lb

## 2021-10-27 DIAGNOSIS — Z6835 Body mass index (BMI) 35.0-35.9, adult: Secondary | ICD-10-CM | POA: Diagnosis not present

## 2021-10-27 DIAGNOSIS — E1169 Type 2 diabetes mellitus with other specified complication: Secondary | ICD-10-CM | POA: Diagnosis not present

## 2021-10-27 DIAGNOSIS — K219 Gastro-esophageal reflux disease without esophagitis: Secondary | ICD-10-CM | POA: Insufficient documentation

## 2021-10-27 DIAGNOSIS — E669 Obesity, unspecified: Secondary | ICD-10-CM | POA: Diagnosis not present

## 2021-10-27 DIAGNOSIS — Z7984 Long term (current) use of oral hypoglycemic drugs: Secondary | ICD-10-CM

## 2021-10-27 MED ORDER — SEMAGLUTIDE (2 MG/DOSE) 8 MG/3ML ~~LOC~~ SOPN: 2.0000 mg | PEN_INJECTOR | SUBCUTANEOUS | 0 refills | Status: DC

## 2021-10-27 NOTE — Progress Notes (Signed)
Chief Complaint:   OBESITY Domenico is here to discuss his progress with his obesity treatment plan along with follow-up of his obesity related diagnoses. Jakobee is on following a lower carbohydrate, vegetable and lean protein rich diet plan and Keto and states he is following his eating plan approximately 50% of the time. [place] states he is doing water aerobics for 60 minutes 2 times per week.  Today's visit was #: 12 Starting weight: 259 lbs Starting date: 02/17/2021 Today's weight: 257 lbs Today's date: 10/27/2021 Total lbs lost to date: 2 lbs Total lbs lost since last in-office visit: 0  Interim History: Bee says he has been adhering to low carb plan about 1/2 the time. His wife has been having syncopal episodes and is undergoing evaluation for this. She has had several medical appointments which usually leads to eating out. He is still drinking a few pots of coffee with heavy cream per day.  Subjective:   1. Type 2 diabetes mellitus with other specified complication, without long-term current use of insulin (HCC) Elchanan's last A1C was not at goal (8.3). He is on Ozempic 2 mg and Metformin. His Ozempic dose was increased at last office visit to 2 mg weekly. His CBGs have improved to < 150  past 3 days. He has worsening GERD from the [place].  Lab Results  Component Value Date   HGBA1C 8.3 (H) 10/06/2021   HGBA1C 6.6 04/14/2021   HGBA1C 6.8 (H) 02/17/2021   Lab Results  Component Value Date   LDLCALC 105 04/14/2021   CREATININE 0.88 10/06/2021   No results found for: INSULIN  2. Gastroesophageal reflux disease, unspecified whether esophagitis present Doren notes increased GERD with increased dose of Ozempic. He is on Omeprazole daily.  Assessment/Plan:   1. Type 2 diabetes mellitus with other specified complication, without long-term current use of insulin (HCC) We will refill Ozempic 2 mg weekly. Continue Metformin.  - Semaglutide, 2 MG/DOSE, 8 MG/3ML SOPN; Inject  2 mg as directed once a week.  Dispense: 3 mL; Refill: 0  2. Gastroesophageal reflux disease, unspecified whether esophagitis present  Rolan will add TUMS and famotidine as needed per package directions. We discussed several lifestyle modifications today and he will continue to work on diet, exercise and weight loss efforts.   3. Obesity with current BMI of 35.86 Jasaiah is currently in the action stage of change. As such, his goal is to continue with weight loss efforts. He has agreed to keeping a food journal and adhering to recommended goals of 1800-2000 calories and 125 grams of protein daily and following a lower carbohydrate, vegetable and lean protein rich diet plan.   Charli will work on reducing carbohydrates. He will work on reducing heavy cream in coffee.  Exercise goals:  As is.  Behavioral modification strategies: decreasing simple carbohydrates and keeping a strict food journal.  Aksh has agreed to follow-up with our clinic in 3 weeks with Dr. Owens Shark and 6 weeks virtual.   Objective:   Blood pressure 118/72, pulse 83, temperature 98.2 F (36.8 C), height 5\' 11"  (1.803 m), weight 257 lb (116.6 kg), SpO2 96 %. Body mass index is 35.84 kg/m.  General: Cooperative, alert, well developed, in no acute distress. HEENT: Conjunctivae and lids unremarkable. Cardiovascular: Regular rhythm.  Lungs: Normal work of breathing. Neurologic: No focal deficits.   Lab Results  Component Value Date   CREATININE 0.88 10/06/2021   BUN 10 10/06/2021   NA 140 10/06/2021   K 5.1  10/06/2021   CL 101 10/06/2021   CO2 21 10/06/2021   Lab Results  Component Value Date   ALT 42 10/06/2021   AST 34 10/06/2021   ALKPHOS 52 10/06/2021   BILITOT 0.4 10/06/2021   Lab Results  Component Value Date   HGBA1C 8.3 (H) 10/06/2021   HGBA1C 6.6 04/14/2021   HGBA1C 6.8 (H) 02/17/2021   HGBA1C 6.0 07/15/2015   No results found for: INSULIN Lab Results  Component Value Date   TSH 1.470  02/17/2021   Lab Results  Component Value Date   CHOL 154 04/14/2021   HDL 35 04/14/2021   LDLCALC 105 04/14/2021   TRIG 70 04/14/2021   Lab Results  Component Value Date   VD25OH 33.6 10/06/2021   VD25OH 42.2 06/16/2021   Lab Results  Component Value Date   WBC 7.2 04/14/2021   HGB 17.0 04/14/2021   HCT 50 04/14/2021   MCV 88.8 08/22/2017   PLT 237 04/14/2021   No results found for: IRON, TIBC, FERRITIN  Attestation Statements:   Reviewed by clinician on day of visit: allergies, medications, problem list, medical history, surgical history, family history, social history, and previous encounter notes.  I, Lizbeth Bark, RMA, am acting as Location manager for [employer], [place].  I have reviewed the above documentation for accuracy and completeness, and I agree with the above. -  Georgianne Fick, FNP

## 2021-10-28 ENCOUNTER — Encounter (INDEPENDENT_AMBULATORY_CARE_PROVIDER_SITE_OTHER): Payer: Self-pay | Admitting: Family Medicine

## 2021-11-02 DIAGNOSIS — M542 Cervicalgia: Secondary | ICD-10-CM | POA: Diagnosis not present

## 2021-11-02 DIAGNOSIS — M546 Pain in thoracic spine: Secondary | ICD-10-CM | POA: Diagnosis not present

## 2021-11-02 DIAGNOSIS — M9902 Segmental and somatic dysfunction of thoracic region: Secondary | ICD-10-CM | POA: Diagnosis not present

## 2021-11-02 DIAGNOSIS — M9901 Segmental and somatic dysfunction of cervical region: Secondary | ICD-10-CM | POA: Diagnosis not present

## 2021-11-05 ENCOUNTER — Other Ambulatory Visit: Payer: Self-pay | Admitting: Nurse Practitioner

## 2021-11-05 ENCOUNTER — Encounter: Payer: Self-pay | Admitting: Nurse Practitioner

## 2021-11-05 ENCOUNTER — Telehealth: Payer: Self-pay | Admitting: Nurse Practitioner

## 2021-11-05 ENCOUNTER — Ambulatory Visit (INDEPENDENT_AMBULATORY_CARE_PROVIDER_SITE_OTHER): Payer: Medicare HMO | Admitting: Nurse Practitioner

## 2021-11-05 VITALS — BP 130/81 | HR 92 | Temp 98.1°F | Resp 20 | Ht 71.0 in | Wt 262.0 lb

## 2021-11-05 DIAGNOSIS — K219 Gastro-esophageal reflux disease without esophagitis: Secondary | ICD-10-CM

## 2021-11-05 DIAGNOSIS — E785 Hyperlipidemia, unspecified: Secondary | ICD-10-CM | POA: Diagnosis not present

## 2021-11-05 DIAGNOSIS — E559 Vitamin D deficiency, unspecified: Secondary | ICD-10-CM | POA: Diagnosis not present

## 2021-11-05 DIAGNOSIS — Z6836 Body mass index (BMI) 36.0-36.9, adult: Secondary | ICD-10-CM | POA: Diagnosis not present

## 2021-11-05 DIAGNOSIS — G4733 Obstructive sleep apnea (adult) (pediatric): Secondary | ICD-10-CM | POA: Diagnosis not present

## 2021-11-05 DIAGNOSIS — E1169 Type 2 diabetes mellitus with other specified complication: Secondary | ICD-10-CM | POA: Diagnosis not present

## 2021-11-05 DIAGNOSIS — Z9989 Dependence on other enabling machines and devices: Secondary | ICD-10-CM

## 2021-11-05 DIAGNOSIS — R69 Illness, unspecified: Secondary | ICD-10-CM | POA: Diagnosis not present

## 2021-11-05 DIAGNOSIS — E66812 Obesity, class 2: Secondary | ICD-10-CM

## 2021-11-05 DIAGNOSIS — F3289 Other specified depressive episodes: Secondary | ICD-10-CM

## 2021-11-05 MED ORDER — OMEPRAZOLE MAGNESIUM 20 MG PO TBEC: 20.0000 mg | DELAYED_RELEASE_TABLET | Freq: Every day | ORAL | 1 refills | Status: DC | PRN

## 2021-11-05 MED ORDER — CARISOPRODOL 350 MG PO TABS: 350.0000 mg | ORAL_TABLET | Freq: Three times a day (TID) | ORAL | 1 refills | Status: DC | PRN

## 2021-11-05 MED ORDER — EZETIMIBE 10 MG PO TABS: 10.0000 mg | ORAL_TABLET | Freq: Every day | ORAL | 1 refills | Status: DC

## 2021-11-05 MED ORDER — IBUPROFEN 800 MG PO TABS: 800.0000 mg | ORAL_TABLET | Freq: Two times a day (BID) | ORAL | 1 refills | Status: DC

## 2021-11-05 NOTE — Patient Instructions (Signed)

## 2021-11-05 NOTE — Telephone Encounter (Signed)
°  Prescription Request  11/05/2021  Is this a "Controlled Substance" medicine? NO  Have you seen your PCP in the last 2 weeks? NO  If YES, route message to pool  -  If NO, patient needs to be scheduled for appointment.  What is the name of the medication or equipment? Ibuprofen 800 mg, Carisoprodol 350 mg. Seen MMM this morning and forgot to tell her he needs them. He usually gets #90  Have you contacted your pharmacy to request a refill? NO   Which pharmacy would you like this sent to? [facility] in [place]   Patient notified that their request is being sent to the clinical staff for review and that they should receive a response within 2 business days.

## 2021-11-05 NOTE — Progress Notes (Signed)
Subjective:    Patient ID: Roger Guerra, male    DOB: 30-May-1956, 66 y.o.   MRN: [id number]  Chief Complaint: No chief complaint on file.    HPI:  Roger Guerra is a 66 y.o. who identifies as a male who was assigned male at birth.   Social history: Lives with: wife Work history: [employer] in today for follow up of the following chronic medical issues:  1. Hyperlipidemia associated with type 2 diabetes mellitus ([place]) Does not watch diet and does little to no exercise. Lab Results  Component Value Date   CHOL 154 04/14/2021   HDL 35 04/14/2021   LDLCALC 105 04/14/2021   TRIG 70 04/14/2021  The 10-year ASCVD risk score (Arnett DK, et al., 2019) is: 31.6%* (Cholesterol units were assumed)     2. Gastroesophageal reflux disease, unspecified whether esophagitis present Is on omeprazole daily and is doing well.  3. Type 2 diabetes mellitus with other specified complication, without long-term current use of insulin ([place]) Sees [place], NP for diabetes. Blood sugars have been running high. At his last visit with Whitmire she increased his ozempic to 2mg  weekly. Blood sugars are running around 150 fasting since dose change. Lab Results  Component Value Date   HGBA1C 8.3 (H) 10/06/2021     4. OSA on CPAP Wears CPAP nightly  5. Other depression Is doing well . Is currently on no antidepressants Depression screen [facility] 2/9 11/05/2021 10/02/2021 05/05/2021  Decreased Interest 0 0 0  Down, Depressed, Hopeless 0 0 0  PHQ - 2 Score 0 0 0  Altered sleeping 0 0 0  Tired, decreased energy 0 0 0  Change in appetite 0 2 0  Feeling bad or failure about yourself  0 0 0  Trouble concentrating 0 0 0  Moving slowly or fidgety/restless 0 0 0  Suicidal thoughts 0 0 0  PHQ-9 Score 0 2 0  Difficult doing work/chores Not difficult at all Not difficult at all Not difficult at all     6. Vitamin D deficiency Is on daily vitamin d supplement  7. Class 2 severe obesity with serious  comorbidity and body mass index (BMI) of 36.0 to 36.9 in adult, unspecified obesity type ([place]) Weight is up 5lbs from previous Wt Readings from Last 3 Encounters:  11/05/21 262 lb (118.8 kg)  10/27/21 257 lb (116.6 kg)  10/06/21 257 lb (116.6 kg)   BMI Readings from Last 3 Encounters:  11/05/21 36.54 kg/m  10/27/21 35.84 kg/m  10/06/21 35.84 kg/m     New complaints: Having urinary issues and has appointment with urology on Monday of next week.  Allergies  Allergen Reactions   Invokana [Canagliflozin] Other (See Comments)    Yeast infection   Statins Other (See Comments)    Severe muscle cramps   Outpatient Encounter Medications as of 11/05/2021  Medication Sig   5-Hydroxytryptophan (5-HTP PO) Take by mouth.   aspirin 81 MG tablet Take 81 mg by mouth daily.   carisoprodol (SOMA) 350 MG tablet Take 350 mg 3 (three) times daily as needed by mouth for muscle spasms.   cetirizine (ZYRTEC) 10 MG tablet Take 10 mg by mouth every evening.    co-enzyme Q-10 30 MG capsule Take 30 mg by mouth daily.   ezetimibe (ZETIA) 10 MG tablet Take 1 tablet (10 mg total) by mouth daily.   fexofenadine (ALLEGRA) 180 MG tablet Take 180 mg by mouth every morning.    glucose blood test strip  Use OneTouch Ultra test strips to check blood sugar once daily. DX:E11.69   ibuprofen (ADVIL) 800 MG tablet Take 800 mg by mouth in the morning and at bedtime.   metFORMIN (GLUCOPHAGE) 500 MG tablet Take 1 tablet in the morning and 2 tablets in the evening.   Multiple Vitamin (MULTIVITAMIN WITH MINERALS) TABS tablet Take 1 tablet by mouth daily.   Multiple Vitamins-Minerals (ZINC PO) Take by mouth.   Omega-3 Fatty Acids (FISH OIL PO) Take 1 capsule by mouth daily.   omeprazole (PRILOSEC OTC) 20 MG tablet Take 20 mg by mouth daily as needed (acid reflux).   PRESCRIPTION MEDICATION Inject 1 Dose as directed as needed (erectile dysfunction). Trimix Papaverine 30mg /ml Phentolamine 1mg /ml Prostaglandin 14mcg/ml    Semaglutide, 2 MG/DOSE, 8 MG/3ML SOPN Inject 2 mg as directed once a week.   sodium chloride (OCEAN) 0.65 % SOLN nasal spray Place 1 spray into both nostrils as needed for congestion.   testosterone cypionate (DEPOTESTOSTERONE CYPIONATE) 200 MG/ML injection Inject 200 mg into the muscle. Give 1 ml every 10 days   traMADol (ULTRAM) 50 MG tablet Take 50 mg by mouth every 6 (six) hours as needed.   vitamin E 400 UNIT capsule Take 400 Units by mouth daily.   No facility-administered encounter medications on file as of 11/05/2021.    Past Surgical History:  Procedure Laterality Date   BLADDER SURGERY  2015   removal of skin tag near urethra   COLONOSCOPY N/A 12/10/2016   Procedure: COLONOSCOPY;  Surgeon: Daneil Dolin, MD;  [facility];  Service: Endoscopy;  Laterality: N/A;  8:30 AM   KNEE ARTHROSCOPY WITH MEDIAL MENISECTOMY Right 08/25/2017   Procedure: Right knee arthroscopy, partial medial menisectomy and debridement;  Surgeon: Susa Day, MD;  Location: [facility];  Service: Orthopedics;  Laterality: Right;  60 mins   POLYPECTOMY  12/10/2016   Procedure: POLYPECTOMY;  Surgeon: Daneil Dolin, MD;  [facility];  Service: Endoscopy;;  hepatic flexure   TONSILLECTOMY      Family History  Problem Relation Age of Onset   Diabetes Mother    Lung cancer Father    Heart disease Father    Stroke Father    Cancer Father    Diabetes Sister    Diabetes Other        Siblings      Controlled substance contract: n/a     Review of Systems  Constitutional:  Negative for diaphoresis.  Eyes:  Negative for pain.  Respiratory:  Negative for shortness of breath.   Cardiovascular:  Negative for chest pain, palpitations and leg swelling.  Gastrointestinal:  Negative for abdominal pain.  Endocrine: Negative for polydipsia.  Skin:  Negative for rash.  Neurological:  Negative for dizziness, weakness and headaches.  Hematological:  Does not bruise/bleed easily.  All other  systems reviewed and are negative.     Objective:   Physical Exam Vitals and nursing note reviewed.  Constitutional:      Appearance: Normal appearance. He is well-developed.  HENT:     Head: Normocephalic.     Nose: Nose normal.     Mouth/Throat:     Mouth: Mucous membranes are moist.     Pharynx: Oropharynx is clear.  Eyes:     Pupils: Pupils are equal, round, and reactive to light.  Neck:     Thyroid: No thyroid mass or thyromegaly.     Vascular: No carotid bruit or JVD.     Trachea: Phonation  normal.  Cardiovascular:     Rate and Rhythm: Normal rate and regular rhythm.  Pulmonary:     Effort: Pulmonary effort is normal. No respiratory distress.     Breath sounds: Normal breath sounds.  Abdominal:     General: Bowel sounds are normal.     Palpations: Abdomen is soft.     Tenderness: There is no abdominal tenderness.  Musculoskeletal:        General: Normal range of motion.     Cervical back: Normal range of motion and neck supple.  Lymphadenopathy:     Cervical: No cervical adenopathy.  Skin:    General: Skin is warm and dry.  Neurological:     Mental Status: He is alert and oriented to person, place, and time.  Psychiatric:        Behavior: Behavior normal.        Thought Content: Thought content normal.        Judgment: Judgment normal.    BP 130/81    Pulse 92    Temp 98.1 F (36.7 C) (Temporal)    Resp 20    Ht 5\' 11"  (1.803 m)    Wt 262 lb (118.8 kg)    SpO2 93%    BMI 36.54 kg/m        Assessment & Plan:  Joh Rao comes in today with chief complaint of Medical Management of Chronic Issues   Diagnosis and orders addressed:  1. Hyperlipidemia associated with type 2 diabetes mellitus (HCC) Low fat diet - ezetimibe (ZETIA) 10 MG tablet; Take 1 tablet (10 mg total) by mouth daily.  Dispense: 90 tablet; Refill: 1  2. Gastroesophageal reflux disease, unspecified whether esophagitis present Avoid spicy foods Do not eat 2 hours prior to  bedtime  - omeprazole (PRILOSEC OTC) 20 MG tablet; Take 1 tablet (20 mg total) by mouth daily as needed (acid reflux).  Dispense: 90 tablet; Refill: 1  3. Type 2 diabetes mellitus with other specified complication, without long-term current use of insulin ([place]) Keep follow up with wellness center  4. OSA on CPAP Continue to wear CPAP nightly  5. Other depression Stress management  6. Vitamin D deficiency Continue daily vitamin d supplement  7. Class 2 severe obesity with serious comorbidity and body mass index (BMI) of 36.0 to 36.9 in adult, unspecified obesity type ([place]) Discussed diet and exercise for person with BMI >25 Will recheck weight in 3-6 months    Labs pending Health Maintenance reviewed Diet and exercise encouraged  Follow up plan: 6 months   Mary-Margaret Hassell Done, FNP

## 2021-11-06 NOTE — Telephone Encounter (Signed)
LMOVM refills sent to the pharmacy 

## 2021-11-09 ENCOUNTER — Other Ambulatory Visit: Payer: Self-pay

## 2021-11-09 ENCOUNTER — Ambulatory Visit: Payer: Medicare HMO | Admitting: Urology

## 2021-11-09 ENCOUNTER — Encounter: Payer: Self-pay | Admitting: Urology

## 2021-11-09 VITALS — BP 145/79 | HR 92 | Wt 265.0 lb

## 2021-11-09 DIAGNOSIS — N138 Other obstructive and reflux uropathy: Secondary | ICD-10-CM | POA: Diagnosis not present

## 2021-11-09 DIAGNOSIS — N401 Enlarged prostate with lower urinary tract symptoms: Secondary | ICD-10-CM | POA: Diagnosis not present

## 2021-11-09 DIAGNOSIS — R39198 Other difficulties with micturition: Secondary | ICD-10-CM | POA: Diagnosis not present

## 2021-11-09 DIAGNOSIS — N5201 Erectile dysfunction due to arterial insufficiency: Secondary | ICD-10-CM | POA: Diagnosis not present

## 2021-11-09 LAB — URINALYSIS, ROUTINE W REFLEX MICROSCOPIC
Bilirubin, UA: NEGATIVE
Glucose, UA: NEGATIVE
Ketones, UA: NEGATIVE
Leukocytes,UA: NEGATIVE
Nitrite, UA: NEGATIVE
Protein,UA: NEGATIVE
RBC, UA: NEGATIVE
Specific Gravity, UA: 1.01 (ref 1.005–1.030)
Urobilinogen, Ur: 0.2 mg/dL (ref 0.2–1.0)
pH, UA: 6.5 (ref 5.0–7.5)

## 2021-11-09 MED ORDER — SILODOSIN 8 MG PO CAPS: 8.0000 mg | ORAL_CAPSULE | Freq: Every evening | ORAL | 11 refills | Status: DC

## 2021-11-09 MED ORDER — AMBULATORY NON FORMULARY MEDICATION: 0.2000 mL | 5 refills | Status: DC | PRN

## 2021-11-09 MED ORDER — AMBULATORY NON FORMULARY MEDICATION: 5 refills | Status: AC

## 2021-11-09 NOTE — Addendum Note (Signed)
Addended byIris Pert on: 11/09/2021 09:57 AM   Modules accepted: Orders

## 2021-11-09 NOTE — Progress Notes (Signed)
11/09/2021 9:06 AM   Roger Guerra 1955/12/27 [id number]  Referring provider: Chevis Guerra, Roger Guerra,  Roger Guerra [id number]  Difficulty urinating and erectile dysfunction   HPI: Roger Guerra is a 66yo here for evaluation of difficulty urinating and erectile dysfunction. He previously followed by Roger Guerra and was treated for both ED and BPH. IPSS 18 QOL 4. He was previously on flomax. He has had a procedure on his bladder neck/ possible median lobe resection. He has intermittent penile pain with ejaculation over the past 2 weeks. He has had issues getting and maintaining an erection. He was previously given trimix which gave give a firm erection.    PMH: Past Medical History:  Diagnosis Date   Anxiety disorder    Arthritis    Atherosclerosis    Back pain    Constipation    Coronary artery calcification seen on CT scan    Depression    Diabetes (HCC)    Erectile dysfunction    GERD (gastroesophageal reflux disease)    Heartburn    History of blood clots    History of DVT (deep vein thrombosis)    History of DVT of lower extremity    Right leg 2002    History of pneumonia    Hyperlipidemia    Hypogonadism in male    Hypopituitarism ([place])    Joint pain    Nerve root and plexus disorder    Obesity    Obstructive sleep apnea    OSA on CPAP    Osteoarthritis    Sciatica    Sleep apnea    SOBOE (shortness of breath on exertion)    Spinal stenosis    Spondylosis    Swelling of both lower extremities    Type 2 diabetes mellitus (HCC)    Vitamin D deficiency     Surgical History: Past Surgical History:  Procedure Laterality Date   BLADDER SURGERY  2015   removal of skin tag near urethra   COLONOSCOPY N/A 12/10/2016   Procedure: COLONOSCOPY;  Surgeon: Daneil Dolin, MD;  [facility];  Service: Endoscopy;  Laterality: N/A;  8:30 AM   KNEE ARTHROSCOPY WITH MEDIAL MENISECTOMY Right 08/25/2017   Procedure: Right knee arthroscopy,  partial medial menisectomy and debridement;  Surgeon: Susa Day, MD;  [facility]: [facility];  Service: Orthopedics;  Laterality: Right;  60 mins   POLYPECTOMY  12/10/2016   Procedure: POLYPECTOMY;  Surgeon: Daneil Dolin, MD;  [facility];  Service: Endoscopy;;  hepatic flexure   TONSILLECTOMY      Home Medications:  Allergies as of 11/09/2021       Reactions   Invokana [canagliflozin] Other (See Comments)   Yeast infection   Statins Other (See Comments)   Severe muscle cramps        Medication List        Accurate as of November 09, 2021  9:06 AM. If you have any questions, ask your nurse or doctor.          5-HTP PO Take by mouth.   aspirin 81 MG tablet Take 81 mg by mouth daily.   carisoprodol 350 MG tablet Commonly known as: SOMA Take 1 tablet (350 mg total) by mouth 3 (three) times daily as needed for muscle spasms.   cetirizine 10 MG tablet Commonly known as: ZYRTEC Take 10 mg by mouth every evening.   co-enzyme Q-10 30 MG capsule Take 30 mg by mouth daily.  ezetimibe 10 MG tablet Commonly known as: ZETIA Take 1 tablet (10 mg total) by mouth daily.   fexofenadine 180 MG tablet Commonly known as: ALLEGRA Take 180 mg by mouth every morning.   FISH OIL PO Take 1 capsule by mouth daily.   glucose blood test strip Use OneTouch Ultra test strips to check blood sugar once daily. DX:E11.69   ibuprofen 800 MG tablet Commonly known as: ADVIL Take 1 tablet (800 mg total) by mouth in the morning and at bedtime.   metFORMIN 500 MG tablet Commonly known as: GLUCOPHAGE Take 1 tablet in the morning and 2 tablets in the evening.   multivitamin with minerals Tabs tablet Take 1 tablet by mouth daily.   omeprazole 20 MG tablet Commonly known as: PRILOSEC OTC Take 1 tablet (20 mg total) by mouth daily as needed (acid reflux).   PRESCRIPTION MEDICATION Inject 1 Dose as directed as needed (erectile dysfunction). Trimix Papaverine 30mg /ml  Phentolamine 1mg /ml Prostaglandin 68mcg/ml   Semaglutide (2 MG/DOSE) 8 MG/3ML Sopn Inject 2 mg as directed once a week.   sodium chloride 0.65 % Soln nasal spray Commonly known as: OCEAN Place 1 spray into both nostrils as needed for congestion.   testosterone cypionate 200 MG/ML injection Commonly known as: DEPOTESTOSTERONE CYPIONATE Inject 200 mg into the muscle. Give 1 ml every 10 days   traMADol 50 MG tablet Commonly known as: ULTRAM Take 50 mg by mouth every 6 (six) hours as needed.   vitamin E 180 MG (400 UNITS) capsule Take 400 Units by mouth daily.   ZINC PO Take by mouth.        Allergies:  Allergies  Allergen Reactions   Invokana [Canagliflozin] Other (See Comments)    Yeast infection   Statins Other (See Comments)    Severe muscle cramps    Family History: Family History  Problem Relation Age of Onset   Diabetes Mother    Lung cancer Father    Heart disease Father    Stroke Father    Cancer Father    Diabetes Sister    Diabetes Other        Siblings    Social History:  reports that he has quit smoking. His smoking use included cigarettes. He has a 37.50 pack-year smoking history. He has never used smokeless tobacco. He reports that he does not drink alcohol and does not use drugs.  ROS: All other review of systems were reviewed and are negative except what is noted above in HPI  Physical Exam: BP (!) 145/79    Pulse 92    Wt 265 lb (120.2 kg)    BMI 36.96 kg/m   Constitutional:  Alert and oriented, No acute distress. HEENT: [place] AT, moist mucus membranes.  Trachea midline, no masses. Cardiovascular: No clubbing, cyanosis, or edema. Respiratory: Normal respiratory effort, no increased work of breathing. GI: Abdomen is soft, nontender, nondistended, no abdominal masses GU: No CVA tenderness. Circumcised phallus. No masses/lesions on penis, testis, scrotum. Prostate 40g smooth no nodules no induration.  Lymph: No cervical or inguinal  lymphadenopathy. Skin: No rashes, bruises or suspicious lesions. Neurologic: Grossly intact, no focal deficits, moving all 4 extremities. Psychiatric: Normal mood and affect.  Laboratory Data: Lab Results  Component Value Date   WBC 7.2 04/14/2021   HGB 17.0 04/14/2021   HCT 50 04/14/2021   MCV 88.8 08/22/2017   PLT 237 04/14/2021    Lab Results  Component Value Date   CREATININE 0.88 10/06/2021    No  results found for: PSA  Lab Results  Component Value Date   TESTOSTERONE 592 06/16/2021    Lab Results  Component Value Date   HGBA1C 8.3 (H) 10/06/2021    Urinalysis No results found for: COLORURINE, APPEARANCEUR, LABSPEC, PHURINE, GLUCOSEU, HGBUR, BILIRUBINUR, KETONESUR, PROTEINUR, UROBILINOGEN, NITRITE, LEUKOCYTESUR  No results found for: LABMICR, [place], RBCUA, LABEPIT, MUCUS, BACTERIA  Pertinent Imaging:  No results found for this or any previous visit.  No results found for this or any previous visit.  No results found for this or any previous visit.  No results found for this or any previous visit.  No results found for this or any previous visit.  No results found for this or any previous visit.  No results found for this or any previous visit.  No results found for this or any previous visit.   Assessment & Plan:    1. Difficulty urinating -We will trial rapaflo 8g qhs. If this fails to improve his LUTS we will proceed with cystoscopy - Urinalysis, Routine w reflex microscopic  2. Benign prostatic hyperplasia with urinary obstruction -rapaflo 8mg  qhs  3. Erectile dysfunction due to arterial insufficiency -Trimix prn   No follow-ups on file.  Nicolette Bang, MD  [facility] [place]

## 2021-11-09 NOTE — Addendum Note (Signed)
Addended byIris Pert on: 11/09/2021 09:47 AM   Modules accepted: Orders

## 2021-11-09 NOTE — Patient Instructions (Signed)

## 2021-11-11 LAB — HM DIABETES EYE EXAM

## 2021-11-12 ENCOUNTER — Ambulatory Visit: Payer: Medicare HMO | Admitting: Nurse Practitioner

## 2021-11-17 ENCOUNTER — Encounter (INDEPENDENT_AMBULATORY_CARE_PROVIDER_SITE_OTHER): Payer: Self-pay | Admitting: Bariatrics

## 2021-11-17 ENCOUNTER — Ambulatory Visit (INDEPENDENT_AMBULATORY_CARE_PROVIDER_SITE_OTHER): Payer: Medicare HMO | Admitting: Bariatrics

## 2021-11-17 ENCOUNTER — Other Ambulatory Visit: Payer: Self-pay

## 2021-11-17 VITALS — BP 125/73 | HR 86 | Temp 98.1°F | Ht 71.0 in | Wt 258.0 lb

## 2021-11-17 DIAGNOSIS — Z6836 Body mass index (BMI) 36.0-36.9, adult: Secondary | ICD-10-CM

## 2021-11-17 DIAGNOSIS — E669 Obesity, unspecified: Secondary | ICD-10-CM | POA: Diagnosis not present

## 2021-11-17 DIAGNOSIS — E1169 Type 2 diabetes mellitus with other specified complication: Secondary | ICD-10-CM | POA: Diagnosis not present

## 2021-11-17 DIAGNOSIS — E785 Hyperlipidemia, unspecified: Secondary | ICD-10-CM

## 2021-11-17 MED ORDER — ONDANSETRON HCL 4 MG PO TABS: 4.0000 mg | ORAL_TABLET | Freq: Three times a day (TID) | ORAL | 1 refills | Status: DC | PRN

## 2021-11-17 NOTE — Progress Notes (Signed)
Chief Complaint:   OBESITY Roger Guerra is here to discuss his progress with his obesity treatment plan along with follow-up of his obesity related diagnoses. Roger Guerra is on keeping a food journal and adhering to recommended goals of 1800-2000 calories and 125 grams of protein daily or following a lower carbohydrate, vegetable and lean protein rich diet plan and states he is following his eating plan approximately 70% of the time. Roger Guerra states he is swimming for 60 minutes 2 times per week.  Today's visit was #: 57 Starting weight: 259 lbs Starting date: 02/17/2021 Today's weight: 258 lbs Today's date: 11/17/2021 Total lbs lost to date: 1 Total lbs lost since last in-office visit: 7  Interim History: Roger Guerra is down and additional 7 lbs since his last visit. He had problems with the Ozempic, nausea and diarrhea. He followed the plan of Dr. Leeanne Rio in the past.  Subjective:   1. Type 2 diabetes mellitus with other specified complication, without long-term current use of insulin (HCC) Roger Guerra is taking metformin and Ozempic, which was recently increased to 2 mg.   2. Hyperlipidemia associated with type 2 diabetes mellitus ([place]) Tanor is taking Zetia.  Assessment/Plan:   1. Type 2 diabetes mellitus with other specified complication, without long-term current use of insulin (HCC) Roger Guerra will decrease Ozempic to 1 mg instead of 2 mg. He will start Zofran 4 mg for nausea and vomiting as needed. Good blood sugar control is important to decrease the likelihood of diabetic complications such as nephropathy, neuropathy, limb loss, blindness, coronary artery disease, and death. Intensive lifestyle modification including diet, exercise and weight loss are the first line of treatment for diabetes.   - ondansetron (ZOFRAN) 4 MG tablet; Take 1 tablet (4 mg total) by mouth every 8 (eight) hours as needed for nausea or vomiting.  Dispense: 20 tablet; Refill: 1  2. Hyperlipidemia associated with type 2  diabetes mellitus ([place]) Cardiovascular risk and specific lipid/LDL goals reviewed.  We discussed several lifestyle modifications today. Roger Guerra will continue Zetia, and will continue to work on diet, exercise and weight loss efforts. Orders and follow up as documented in patient record.   Counseling Intensive lifestyle modifications are the first line treatment for this issue. Dietary changes: Increase soluble fiber. Decrease simple carbohydrates. Exercise changes: Moderate to vigorous-intensity aerobic activity 150 minutes per week if tolerated. Lipid-lowering medications: see documented in medical record.  3. Obesity with current BMI of 36.0 Roger Guerra is currently in the action stage of change. As such, his goal is to continue with weight loss efforts. He has agreed to keeping a food journal and adhering to recommended goals of 1800 calories and 90-100 grams of protein daily or following a lower carbohydrate, vegetable and lean protein rich diet plan. (Dr. Leeanne Rio plan at [facility])  Will adhere closely to his plan 80-90% or more. Meal planning.  Exercise goals: As is.  Behavioral modification strategies: increasing lean protein intake, decreasing simple carbohydrates, increasing vegetables, increasing water intake, decreasing eating out, no skipping meals, meal planning and cooking strategies, keeping healthy foods in the home, and planning for success.  [place] has agreed to follow-up with our clinic via virtual in 3 weeks with [employer], FNP-C. He was informed of the importance of frequent follow-up visits to maximize his success with intensive lifestyle modifications for his multiple health conditions.   Objective:   Blood pressure 125/73, pulse 86, temperature 98.1 F (36.7 C), height 5\' 11"  (1.803 m), weight 258 lb (117 kg), SpO2 97 %.  Body mass index is 35.98 kg/m.  General: Cooperative, alert, well developed, in no acute distress. HEENT: Conjunctivae and lids  unremarkable. Cardiovascular: Regular rhythm.  Lungs: Normal work of breathing. Neurologic: No focal deficits.   Lab Results  Component Value Date   CREATININE 0.88 10/06/2021   BUN 10 10/06/2021   NA 140 10/06/2021   K 5.1 10/06/2021   CL 101 10/06/2021   CO2 21 10/06/2021   Lab Results  Component Value Date   ALT 42 10/06/2021   AST 34 10/06/2021   ALKPHOS 52 10/06/2021   BILITOT 0.4 10/06/2021   Lab Results  Component Value Date   HGBA1C 8.3 (H) 10/06/2021   HGBA1C 6.6 04/14/2021   HGBA1C 6.8 (H) 02/17/2021   HGBA1C 6.0 07/15/2015   No results found for: INSULIN Lab Results  Component Value Date   TSH 1.470 02/17/2021   Lab Results  Component Value Date   CHOL 154 04/14/2021   HDL 35 04/14/2021   LDLCALC 105 04/14/2021   TRIG 70 04/14/2021   Lab Results  Component Value Date   VD25OH 33.6 10/06/2021   VD25OH 42.2 06/16/2021   Lab Results  Component Value Date   WBC 7.2 04/14/2021   HGB 17.0 04/14/2021   HCT 50 04/14/2021   MCV 88.8 08/22/2017   PLT 237 04/14/2021   No results found for: IRON, TIBC, FERRITIN  Attestation Statements:   Reviewed by clinician on day of visit: allergies, medications, problem list, medical history, surgical history, family history, social history, and previous encounter notes.   Wilhemena Durie, am acting as Location manager for [employer], DO.  I have reviewed the above documentation for accuracy and completeness, and I agree with the above. Jearld Lesch, DO

## 2021-11-18 ENCOUNTER — Encounter (INDEPENDENT_AMBULATORY_CARE_PROVIDER_SITE_OTHER): Payer: Self-pay | Admitting: Bariatrics

## 2021-11-25 DIAGNOSIS — M546 Pain in thoracic spine: Secondary | ICD-10-CM | POA: Diagnosis not present

## 2021-11-25 DIAGNOSIS — M542 Cervicalgia: Secondary | ICD-10-CM | POA: Diagnosis not present

## 2021-11-25 DIAGNOSIS — M9902 Segmental and somatic dysfunction of thoracic region: Secondary | ICD-10-CM | POA: Diagnosis not present

## 2021-11-25 DIAGNOSIS — M9901 Segmental and somatic dysfunction of cervical region: Secondary | ICD-10-CM | POA: Diagnosis not present

## 2021-12-03 ENCOUNTER — Encounter: Payer: Self-pay | Admitting: *Deleted

## 2021-12-07 ENCOUNTER — Ambulatory Visit (INDEPENDENT_AMBULATORY_CARE_PROVIDER_SITE_OTHER): Payer: Medicare HMO | Admitting: Family Medicine

## 2021-12-07 ENCOUNTER — Encounter: Payer: Self-pay | Admitting: Family Medicine

## 2021-12-07 VITALS — BP 114/75 | HR 93 | Temp 98.5°F | Ht 71.0 in | Wt 254.8 lb

## 2021-12-07 DIAGNOSIS — S80862A Insect bite (nonvenomous), left lower leg, initial encounter: Secondary | ICD-10-CM

## 2021-12-07 DIAGNOSIS — W57XXXA Bitten or stung by nonvenomous insect and other nonvenomous arthropods, initial encounter: Secondary | ICD-10-CM

## 2021-12-07 MED ORDER — DOXYCYCLINE HYCLATE 100 MG PO CAPS: 100.0000 mg | ORAL_CAPSULE | Freq: Two times a day (BID) | ORAL | 0 refills | Status: DC

## 2021-12-07 NOTE — Progress Notes (Signed)
Chief Complaint  ?Patient presents with  ? Tick Removal  ?  Found tick Friday night  ? ? ?HPI ? ?Patient presents today for pulling an engorged seed tick off of the left popliteal fossa. Removed it 3 days ago.  Local reaction. No distal rash.  ? ?PMH: Smoking status noted ?ROS: Per HPI ? ?Objective: ?BP 114/75   Pulse 93   Temp 98.5 ?F (36.9 ?C)   Ht '5\' 11"'$  (1.803 m)   Wt 254 lb 12.8 oz (115.6 kg)   SpO2 96%   BMI 35.54 kg/m?  ?Gen: NAD, alert, cooperative with exam ?HEENT: NCAT, EOMI, PERRL ?Skin - left popliteal fossa has 1 cm erythema with pustular center ?Ext: No edema, warm ?Neuro: Alert and oriented, No gross deficits ? ?Assessment and plan: ? ?1. Tick bite, initial encounter   ? ? ?Meds ordered this encounter  ?Medications  ? doxycycline (VIBRAMYCIN) 100 MG capsule  ?  Sig: Take 1 capsule (100 mg total) by mouth 2 (two) times daily.  ?  Dispense:  20 capsule  ?  Refill:  0  ? ? ?No orders of the defined types were placed in this encounter. ? ? ?Follow up as needed. ? ?Claretta Fraise, MD ? ? ? ? ?

## 2021-12-07 NOTE — Progress Notes (Signed)
TeleHealth Visit:  Due to the COVID-19 pandemic, this visit was completed with telemedicine (audio/video) technology to reduce patient and provider exposure as well as to preserve personal protective equipment.   Roger Guerra has verbally consented to this TeleHealth visit. The patient is located at home, the provider is located at home. The participants in this visit include the listed provider and patient. The visit was conducted today via MyChart video.  OBESITY Roger Guerra is here to discuss his progress with his obesity treatment plan along with follow-up of his obesity related diagnoses.   Today's date: 12/07/2021 Today's visit was # 14 Starting weight: 259 lbs Starting date: 02/17/2021 Weight at last in office visit: 258 lbs Today's reported weight: 254 lbs at MD yesterday.  Interim History: Roger Guerra has cut out heavy cream in his coffee.  He says he gave it up for lent.  He was drinking 2 pots of coffee per day with about a cup and a half of heavy cream spread throughout the day.  He calculated the calories on this to about 1200/day.  He is aiming for 125 gms of protein per day and reaches the goal most days. Also eats salads and other vegetables. Water intake is adequate. Has one pot of black coffee per day.   Nutrition Plan: following a lower carbohydrate, vegetable and lean protein rich diet plan (Dr. Danny Guerra plan) 80% of time. Dr. Danny Guerra is a bariatrician at [facility] that Roger Guerra has seen in the past.  Hunger is moderately controlled. Cravings are moderately controlled.  Current exercise: water aerobics 3 x week. Walking 3/4 mile per day. Hikes on weekends. Assessment/Plan:  Type II Diabetes HgbA1c is not at goal. CBGs: Fasting average 117 Any episodes of hypoglycemia? no Medication(s): Ozempic  2 mg weekly and metformin 500 mg in the a.m. and 1000 mg in the p.m.  Lab Results  Component Value Date   HGBA1C 8.3 (H) 10/06/2021   HGBA1C 6.6 04/14/2021   HGBA1C 6.8 (H) 02/17/2021   Lab  Results  Component Value Date   LDLCALC 105 04/14/2021   CREATININE 0.88 10/06/2021    Plan: Continue Ozempic at 2 mg weekly as well as metformin 500 mg in the a.m. and 1000 mg in the p.m.  Obesity: Current BMI 36 Roger Guerra is currently in the action stage of change. As such, his goal is to continue with weight loss efforts. He has agreed to  following a lower carbohydrate, vegetable and lean protein rich diet plan (Dr. Danny Guerra plan)   Continue exercise: swimming and walking  Behavioral modification strategies: decreasing simple carbohydrates.  Roger Guerra has agreed to follow-up with our clinic in 3 weeks.   No orders of the defined types were placed in this encounter.   There are no discontinued medications.   No orders of the defined types were placed in this encounter.     Objective:   VITALS: Per patient if applicable, see vitals. GENERAL: Alert and in no acute distress. CARDIOPULMONARY: No increased WOB. Speaking in clear sentences.  PSYCH: Pleasant and cooperative. Speech normal rate and rhythm. Affect is appropriate. Insight and judgement are appropriate. Attention is focused, linear, and appropriate.  NEURO: Oriented as arrived to appointment on time with no prompting.   Lab Results  Component Value Date   CREATININE 0.88 10/06/2021   BUN 10 10/06/2021   NA 140 10/06/2021   K 5.1 10/06/2021   CL 101 10/06/2021   CO2 21 10/06/2021   Lab Results  Component Value Date   ALT 42  10/06/2021   AST 34 10/06/2021   ALKPHOS 52 10/06/2021   BILITOT 0.4 10/06/2021   Lab Results  Component Value Date   HGBA1C 8.3 (H) 10/06/2021   HGBA1C 6.6 04/14/2021   HGBA1C 6.8 (H) 02/17/2021   HGBA1C 6.0 07/15/2015   No results found for: INSULIN Lab Results  Component Value Date   TSH 1.470 02/17/2021   Lab Results  Component Value Date   CHOL 154 04/14/2021   HDL 35 04/14/2021   LDLCALC 105 04/14/2021   TRIG 70 04/14/2021   Lab Results  Component Value Date   WBC 7.2  04/14/2021   HGB 17.0 04/14/2021   HCT 50 04/14/2021   MCV 88.8 08/22/2017   PLT 237 04/14/2021   No results found for: IRON, TIBC, FERRITIN Lab Results  Component Value Date   VD25OH 33.6 10/06/2021   VD25OH 42.2 06/16/2021    Attestation Statements:   Reviewed by clinician on day of visit: allergies, medications, problem list, medical history, surgical history, family history, social history, and previous encounter notes.

## 2021-12-08 ENCOUNTER — Encounter (INDEPENDENT_AMBULATORY_CARE_PROVIDER_SITE_OTHER): Payer: Self-pay | Admitting: Family Medicine

## 2021-12-08 ENCOUNTER — Telehealth (INDEPENDENT_AMBULATORY_CARE_PROVIDER_SITE_OTHER): Payer: Medicare HMO | Admitting: Family Medicine

## 2021-12-08 DIAGNOSIS — Z6836 Body mass index (BMI) 36.0-36.9, adult: Secondary | ICD-10-CM

## 2021-12-08 DIAGNOSIS — E669 Obesity, unspecified: Secondary | ICD-10-CM | POA: Diagnosis not present

## 2021-12-08 DIAGNOSIS — E1165 Type 2 diabetes mellitus with hyperglycemia: Secondary | ICD-10-CM | POA: Diagnosis not present

## 2021-12-08 DIAGNOSIS — Z7985 Long-term (current) use of injectable non-insulin antidiabetic drugs: Secondary | ICD-10-CM | POA: Diagnosis not present

## 2021-12-14 ENCOUNTER — Ambulatory Visit: Payer: Medicare HMO | Admitting: Urology

## 2021-12-14 ENCOUNTER — Other Ambulatory Visit: Payer: Self-pay

## 2021-12-14 ENCOUNTER — Encounter: Payer: Self-pay | Admitting: Urology

## 2021-12-14 VITALS — BP 129/76 | HR 87

## 2021-12-14 DIAGNOSIS — N138 Other obstructive and reflux uropathy: Secondary | ICD-10-CM | POA: Diagnosis not present

## 2021-12-14 DIAGNOSIS — R39198 Other difficulties with micturition: Secondary | ICD-10-CM | POA: Diagnosis not present

## 2021-12-14 DIAGNOSIS — N5201 Erectile dysfunction due to arterial insufficiency: Secondary | ICD-10-CM

## 2021-12-14 DIAGNOSIS — R350 Frequency of micturition: Secondary | ICD-10-CM

## 2021-12-14 DIAGNOSIS — N401 Enlarged prostate with lower urinary tract symptoms: Secondary | ICD-10-CM

## 2021-12-14 DIAGNOSIS — E7849 Other hyperlipidemia: Secondary | ICD-10-CM | POA: Diagnosis not present

## 2021-12-14 LAB — URINALYSIS, ROUTINE W REFLEX MICROSCOPIC
Bilirubin, UA: NEGATIVE
Glucose, UA: NEGATIVE
Ketones, UA: NEGATIVE
Leukocytes,UA: NEGATIVE
Nitrite, UA: NEGATIVE
Protein,UA: NEGATIVE
RBC, UA: NEGATIVE
Specific Gravity, UA: <1.005 — ABNORMAL LOW (ref 1.005–1.030)
Urobilinogen, Ur: 0.2 mg/dL (ref 0.2–1.0)
pH, UA: 6 (ref 5.0–7.5)

## 2021-12-14 NOTE — Patient Instructions (Signed)

## 2021-12-14 NOTE — Progress Notes (Signed)
? ?12/14/2021 ?11:30 AM  ? ?Roger Guerra ?[birth year] ?[id number] ? ?Referring provider: Chevis Pretty, FNP ?[address] ?[place],  [place] [address] ? ?Followup BPH and erectile dysfunction ? ? ?HPI: ?Roger Guerra is a 66yo here for followup for BPh with Urinary frequency. IPSS 13 QOL 3 on rapaflo '8mg'$  qhs. Urine stream strong. Urinary frequency every 1-2 hours. He pain with urinating after intercourse has significantly improved. No incontinence. Nocturia 0x.  ?He is using trimix prn 0.41m with firmer erections.  ?PMH: ?Past Medical History:  ?Diagnosis Date  ? Anxiety disorder   ? Arthritis   ? Atherosclerosis   ? Back pain   ? Constipation   ? Coronary artery calcification seen on CT scan   ? Depression   ? Diabetes [place]   ? Erectile dysfunction   ? GERD (gastroesophageal reflux disease)   ? Heartburn   ? History of blood clots   ? History of DVT (deep vein thrombosis)   ? History of DVT of lower extremity   ? Right leg 2002   ? History of pneumonia   ? Hyperlipidemia   ? Hypogonadism in male   ? Hypopituitarism [place]   ? Joint pain   ? Nerve root and plexus disorder   ? Obesity   ? Obstructive sleep apnea   ? OSA on CPAP   ? Osteoarthritis   ? Sciatica   ? Sleep apnea   ? SOBOE (shortness of breath on exertion)   ? Spinal stenosis   ? Spondylosis   ? Swelling of both lower extremities   ? Type 2 diabetes mellitus ([place]   ? Vitamin D deficiency   ? ? ?Surgical History: ?Past Surgical History:  ?Procedure Laterality Date  ? BLADDER SURGERY  2015  ? removal of skin tag near urethra  ? COLONOSCOPY N/A 12/10/2016  ? Procedure: COLONOSCOPY;  Surgeon: RDaneil Dolin MD;  [facility];  Service: Endoscopy;  Laterality: N/A;  8:30 AM  ? KNEE ARTHROSCOPY WITH MEDIAL MENISECTOMY Right 08/25/2017  ? Procedure: Right knee arthroscopy, partial medial menisectomy and debridement;  Surgeon: BSusa Day MD;  [facility]: [facility];  Service: Orthopedics;  Laterality: Right;  60 mins  ? POLYPECTOMY  12/10/2016   ? Procedure: POLYPECTOMY;  Surgeon: RDaneil Dolin MD;  [facility];  Service: Endoscopy;;  hepatic flexure  ? TONSILLECTOMY    ? ? ?Home Medications:  ?Allergies as of 12/14/2021   ? ?   Reactions  ? Invokana [canagliflozin] Other (See Comments)  ? Yeast infection  ? Statins Other (See Comments)  ? Severe muscle cramps  ? ?  ? ?  ?Medication List  ?  ? ?  ? Accurate as of December 14, 2021 11:30 AM. If you have any questions, ask your nurse or doctor.  ?  ?  ? ?  ? ?5-HTP PO ?Take by mouth. ?  ?AMBULATORY NON FORMULARY MEDICATION ?0.2 mLs by Intracavernosal route as needed. Medication Name: Trimix ? ?PGE 353m ?Pap '30mg'$  ?Phent '1mg'$  ?  ?AMBULATORY NON FORMULARY MEDICATION ?Medication Name: Trimix ? ?PGE 30 mcg ?Pap 30 mg ?Phent 1 mg ?  ?aspirin 81 MG tablet ?Take 81 mg by mouth daily. ?  ?carisoprodol 350 MG tablet ?Commonly known as: SOMA ?Take 1 tablet (350 mg total) by mouth 3 (three) times daily as needed for muscle spasms. ?  ?cetirizine 10 MG tablet ?Commonly known as: ZYRTEC ?Take 10 mg by mouth every evening. ?  ?co-enzyme Q-10 30 MG  capsule ?Take 30 mg by mouth daily. ?  ?doxycycline 100 MG capsule ?Commonly known as: Vibramycin ?Take 1 capsule (100 mg total) by mouth 2 (two) times daily. ?  ?ezetimibe 10 MG tablet ?Commonly known as: ZETIA ?Take 1 tablet (10 mg total) by mouth daily. ?  ?fexofenadine 180 MG tablet ?Commonly known as: ALLEGRA ?Take 180 mg by mouth every morning. ?  ?FISH OIL PO ?Take 1 capsule by mouth daily. ?  ?glucose blood test strip ?Use OneTouch Ultra test strips to check blood sugar once daily. DX:E11.69 ?  ?ibuprofen 800 MG tablet ?Commonly known as: ADVIL ?Take 1 tablet (800 mg total) by mouth in the morning and at bedtime. ?  ?metFORMIN 500 MG tablet ?Commonly known as: GLUCOPHAGE ?Take 1 tablet in the morning and 2 tablets in the evening. ?  ?multivitamin with minerals Tabs tablet ?Take 1 tablet by mouth daily. ?  ?omeprazole 20 MG tablet ?Commonly known as: PRILOSEC  OTC ?Take 1 tablet (20 mg total) by mouth daily as needed (acid reflux). ?  ?ondansetron 4 MG tablet ?Commonly known as: Zofran ?Take 1 tablet (4 mg total) by mouth every 8 (eight) hours as needed for nausea or vomiting. ?  ?PRESCRIPTION MEDICATION ?Inject 1 Dose as directed as needed (erectile dysfunction). Trimix Papaverine '30mg'$ /ml Phentolamine '1mg'$ /ml Prostaglandin 77mg/ml ?  ?Semaglutide (2 MG/DOSE) 8 MG/3ML Sopn ?Inject 2 mg as directed once a week. ?  ?silodosin 8 MG Caps capsule ?Commonly known as: RAPAFLO ?Take 1 capsule (8 mg total) by mouth at bedtime. ?  ?sodium chloride 0.65 % Soln nasal spray ?Commonly known as: OCEAN ?Place 1 spray into both nostrils as needed for congestion. ?  ?testosterone cypionate 200 MG/ML injection ?Commonly known as: DEPOTESTOSTERONE CYPIONATE ?Inject 200 mg into the muscle. Give 1 ml every 10 days ?  ?traMADol 50 MG tablet ?Commonly known as: ULTRAM ?Take 50 mg by mouth every 6 (six) hours as needed. ?  ?vitamin E 180 MG (400 UNITS) capsule ?Take 400 Units by mouth daily. ?  ?ZINC PO ?Take by mouth. ?  ? ?  ? ? ?Allergies:  ?Allergies  ?Allergen Reactions  ? Invokana [Canagliflozin] Other (See Comments)  ?  Yeast infection  ? Statins Other (See Comments)  ?  Severe muscle cramps  ? ? ?Family History: ?Family History  ?Problem Relation Age of Onset  ? Diabetes Mother   ? Lung cancer Father   ? Heart disease Father   ? Stroke Father   ? Cancer Father   ? Diabetes Sister   ? Diabetes Other   ?     Siblings  ? ? ?Social History:  reports that he has quit smoking. His smoking use included cigarettes. He has a 37.50 pack-year smoking history. He has never used smokeless tobacco. He reports that he does not drink alcohol and does not use drugs. ? ?ROS: ?All other review of systems were reviewed and are negative except what is noted above in HPI ? ?Physical Exam: ?BP 129/76   Pulse 87   ?Constitutional:  Alert and oriented, No acute distress. ?HEENT: [place] AT, moist mucus membranes.   Trachea midline, no masses. ?Cardiovascular: No clubbing, cyanosis, or edema. ?Respiratory: Normal respiratory effort, no increased work of breathing. ?GI: Abdomen is soft, nontender, nondistended, no abdominal masses ?GU: No CVA tenderness.  ?Lymph: No cervical or inguinal lymphadenopathy. ?Skin: No rashes, bruises or suspicious lesions. ?Neurologic: Grossly intact, no focal deficits, moving all 4 extremities. ?Psychiatric: Normal mood and affect. ? ?Laboratory Data: ?  Lab Results  ?Component Value Date  ? WBC 7.2 04/14/2021  ? HGB 17.0 04/14/2021  ? HCT 50 04/14/2021  ? MCV 88.8 08/22/2017  ? PLT 237 04/14/2021  ? ? ?Lab Results  ?Component Value Date  ? CREATININE 0.88 10/06/2021  ? ? ?No results found for: PSA ? ?Lab Results  ?Component Value Date  ? TESTOSTERONE 592 06/16/2021  ? ? ?Lab Results  ?Component Value Date  ? HGBA1C 8.3 (H) 10/06/2021  ? ? ?Urinalysis ?   ?Component Value Date/Time  ? APPEARANCEUR Clear 11/09/2021 0946  ? GLUCOSEU Negative 11/09/2021 0946  ? BILIRUBINUR Negative 11/09/2021 0946  ? PROTEINUR Negative 11/09/2021 0946  ? NITRITE Negative 11/09/2021 0946  ? LEUKOCYTESUR Negative 11/09/2021 0946  ? ? ?Lab Results  ?Component Value Date  ? LABMICR Comment 11/09/2021  ? ? ?Pertinent Imaging: ? ?No results found for this or any previous visit. ? ?No results found for this or any previous visit. ? ?No results found for this or any previous visit. ? ?No results found for this or any previous visit. ? ?No results found for this or any previous visit. ? ?No results found for this or any previous visit. ? ?No results found for this or any previous visit. ? ?No results found for this or any previous visit. ? ? ?Assessment & Plan:   ? ?1. Urinary frequency ?-Continue rapaflo '8mg'$  qhs ?- Urinalysis, Routine w reflex microscopic ? ?2. Benign prostatic hyperplasia with urinary obstruction ?-Continue rapaflo '8mg'$  qhs ?- Urinalysis, Routine w reflex microscopic ? ?3. Erectile dysfunction ?-Increase trimix  to 0.62m ? ? ?No follow-ups on file. ? ?PNicolette Bang MD ? ?[facility] [place]?  ?

## 2021-12-15 LAB — NMR, LIPOPROFILE
Cholesterol, Total: 149 mg/dL (ref 100–199)
HDL Particle Number: 27.2 umol/L — ABNORMAL LOW (ref >=30.5)
HDL-C: 29 mg/dL — ABNORMAL LOW (ref >39)
LDL Particle Number: 1246 nmol/L — ABNORMAL HIGH (ref <1000)
LDL Size: 20.3 nm — ABNORMAL LOW (ref >20.5)
LDL-C (NIH Calc): 97 mg/dL (ref 0–99)
LP-IR Score: 67 — ABNORMAL HIGH (ref <=45)
Small LDL Particle Number: 700 nmol/L — ABNORMAL HIGH (ref <=527)
Triglycerides: 128 mg/dL (ref 0–149)

## 2021-12-21 DIAGNOSIS — H02413 Mechanical ptosis of bilateral eyelids: Secondary | ICD-10-CM | POA: Diagnosis not present

## 2021-12-21 DIAGNOSIS — H02831 Dermatochalasis of right upper eyelid: Secondary | ICD-10-CM | POA: Diagnosis not present

## 2021-12-21 DIAGNOSIS — H57813 Brow ptosis, bilateral: Secondary | ICD-10-CM | POA: Diagnosis not present

## 2021-12-22 ENCOUNTER — Ambulatory Visit (HOSPITAL_BASED_OUTPATIENT_CLINIC_OR_DEPARTMENT_OTHER): Payer: Medicare HMO | Admitting: Internal Medicine

## 2021-12-22 ENCOUNTER — Encounter (HOSPITAL_BASED_OUTPATIENT_CLINIC_OR_DEPARTMENT_OTHER): Payer: Self-pay | Admitting: Internal Medicine

## 2021-12-22 ENCOUNTER — Other Ambulatory Visit: Payer: Self-pay

## 2021-12-22 VITALS — BP 126/82 | HR 87 | Ht 71.0 in | Wt 262.4 lb

## 2021-12-22 DIAGNOSIS — M791 Myalgia, unspecified site: Secondary | ICD-10-CM | POA: Diagnosis not present

## 2021-12-22 DIAGNOSIS — E785 Hyperlipidemia, unspecified: Secondary | ICD-10-CM | POA: Diagnosis not present

## 2021-12-22 DIAGNOSIS — I2510 Atherosclerotic heart disease of native coronary artery without angina pectoris: Secondary | ICD-10-CM | POA: Diagnosis not present

## 2021-12-22 DIAGNOSIS — T466X5A Adverse effect of antihyperlipidemic and antiarteriosclerotic drugs, initial encounter: Secondary | ICD-10-CM | POA: Diagnosis not present

## 2021-12-22 MED ORDER — NEXLETOL 180 MG PO TABS: 1.0000 | ORAL_TABLET | Freq: Every day | ORAL | 11 refills | Status: DC

## 2021-12-22 NOTE — Progress Notes (Signed)
? ? ?LIPID CLINIC CONSULT NOTE ? ?Chief Complaint:  ?Follow-up dyslipidemia ? ?Primary Care Physician: ?Chevis Pretty, FNP ? ?Primary Cardiologist:  ?Rozann Lesches, MD ? ?HPI:  ?Roger Guerra is a 66 y.o. male who is being seen today for the evaluation of dyslipidemia at the request of Hassell Done, Mary-Margaret, *. Roger Guerra returns today for follow-up of dyslipidemia.  He is continue to work on diet, weight loss and lifestyle modification.  He is being seen in [facility] weight loss clinic.  His cholesterol has come down further including with the addition of ezetimibe.  Total cholesterol now 148, HDL 34, triglycerides 81 and LDL 98.  I am trying to target his LDL to below 70.  LDL particle numbers were 1278 with a small LDL-P of 661, persistently elevated.  Recent testing of his testosterone show that free and total testosterone are within therapeutic range and his vitamin D is normal on treatment at 42. ? ?12/22/2021 ? ?Roger Guerra returns today for follow-up.  When I last saw him I felt that his lipids were improved however not at target.  LDL particle numbers including small LDL particle numbers were still elevated.  He wanted to work on 6 months of dietary changes and activity.  Since then weight has gone up.  Cholesterol appears essentially unchanged with LDL at 97, LDL particle #1246 and small LDL P of 700.  HDL is lower at 29 from 34 previously.  He reports compliance with ezetimibe and denies any side effects.  Unfortunately he had severe muscle cramps on statins and cannot take them. ? ?PMHx:  ?Past Medical History:  ?Diagnosis Date  ? Anxiety disorder   ? Arthritis   ? Atherosclerosis   ? Back pain   ? Constipation   ? Coronary artery calcification seen on CT scan   ? Depression   ? Diabetes ([place])   ? Erectile dysfunction   ? GERD (gastroesophageal reflux disease)   ? Heartburn   ? History of blood clots   ? History of DVT (deep vein thrombosis)   ? History of DVT of lower extremity   ? Right leg  2002   ? History of pneumonia   ? Hyperlipidemia   ? Hypogonadism in male   ? Hypopituitarism ([place])   ? Joint pain   ? Nerve root and plexus disorder   ? Obesity   ? Obstructive sleep apnea   ? OSA on CPAP   ? Osteoarthritis   ? Sciatica   ? Sleep apnea   ? SOBOE (shortness of breath on exertion)   ? Spinal stenosis   ? Spondylosis   ? Swelling of both lower extremities   ? Type 2 diabetes mellitus ([place])   ? Vitamin D deficiency   ? ? ?Past Surgical History:  ?Procedure Laterality Date  ? BLADDER SURGERY  2015  ? removal of skin tag near urethra  ? COLONOSCOPY N/A 12/10/2016  ? Procedure: COLONOSCOPY;  Surgeon: Daneil Dolin, MD;  [facility];  Service: Endoscopy;  Laterality: N/A;  8:30 AM  ? KNEE ARTHROSCOPY WITH MEDIAL MENISECTOMY Right 08/25/2017  ? Procedure: Right knee arthroscopy, partial medial menisectomy and debridement;  Surgeon: Susa Day, MD;  [facility];  Service: Orthopedics;  Laterality: Right;  60 mins  ? POLYPECTOMY  12/10/2016  ? Procedure: POLYPECTOMY;  Surgeon: Daneil Dolin, MD;  [facility];  Service: Endoscopy;;  hepatic flexure  ? TONSILLECTOMY    ? ? ?FAMHx:  ?Family History  ?  Problem Relation Age of Onset  ? Diabetes Mother   ? Lung cancer Father   ? Heart disease Father   ? Stroke Father   ? Cancer Father   ? Diabetes Sister   ? Diabetes Other   ?     Siblings  ? ? ?SOCHx:  ? reports that he has quit smoking. His smoking use included cigarettes. He has a 37.50 pack-year smoking history. He has never used smokeless tobacco. He reports that he does not drink alcohol and does not use drugs. ? ?ALLERGIES:  ?Allergies  ?Allergen Reactions  ? Invokana [Canagliflozin] Other (See Comments)  ?  Yeast infection  ? Statins Other (See Comments)  ?  Severe muscle cramps  ? ? ?ROS: ?Pertinent items noted in HPI and remainder of comprehensive ROS otherwise negative. ? ?HOME MEDS: ?Current Outpatient Medications on File Prior to Visit  ?Medication Sig Dispense Refill  ?  5-Hydroxytryptophan (5-HTP PO) Take by mouth.    ? AMBULATORY NON FORMULARY MEDICATION 0.2 mLs by Intracavernosal route as needed. Medication Name: Trimix ? ?PGE 35mg ?Pap '30mg'$  ?Phent '1mg'$  5 mL 5  ? AMBULATORY NON FORMULARY MEDICATION Medication Name: Trimix ? ?PGE 30 mcg ?Pap 30 mg ?Phent 1 mg 5 mL 5  ? aspirin 81 MG tablet Take 81 mg by mouth daily.    ? carisoprodol (SOMA) 350 MG tablet Take 1 tablet (350 mg total) by mouth 3 (three) times daily as needed for muscle spasms. 30 tablet 1  ? cetirizine (ZYRTEC) 10 MG tablet Take 10 mg by mouth every evening.     ? co-enzyme Q-10 30 MG capsule Take 30 mg by mouth daily.    ? doxycycline (VIBRAMYCIN) 100 MG capsule Take 1 capsule (100 mg total) by mouth 2 (two) times daily. 20 capsule 0  ? ezetimibe (ZETIA) 10 MG tablet Take 1 tablet (10 mg total) by mouth daily. 90 tablet 1  ? fexofenadine (ALLEGRA) 180 MG tablet Take 180 mg by mouth every morning.     ? glucose blood test strip Use OneTouch Ultra test strips to check blood sugar once daily. DX:E11.69 100 each 0  ? ibuprofen (ADVIL) 800 MG tablet Take 1 tablet (800 mg total) by mouth in the morning and at bedtime. 30 tablet 1  ? metFORMIN (GLUCOPHAGE) 500 MG tablet Take 1 tablet in the morning and 2 tablets in the evening. 270 tablet 0  ? Multiple Vitamin (MULTIVITAMIN WITH MINERALS) TABS tablet Take 1 tablet by mouth daily.    ? Multiple Vitamins-Minerals (ZINC PO) Take by mouth.    ? Omega-3 Fatty Acids (FISH OIL PO) Take 1 capsule by mouth daily.    ? omeprazole (PRILOSEC OTC) 20 MG tablet Take 1 tablet (20 mg total) by mouth daily as needed (acid reflux). 90 tablet 1  ? ondansetron (ZOFRAN) 4 MG tablet Take 1 tablet (4 mg total) by mouth every 8 (eight) hours as needed for nausea or vomiting. 20 tablet 1  ? PRESCRIPTION MEDICATION Inject 1 Dose as directed as needed (erectile dysfunction). Trimix Papaverine '30mg'$ /ml Phentolamine '1mg'$ /ml Prostaglandin 13m/ml    ? Semaglutide, 2 MG/DOSE, 8 MG/3ML SOPN Inject 2 mg  as directed once a week. 3 mL 0  ? silodosin (RAPAFLO) 8 MG CAPS capsule Take 1 capsule (8 mg total) by mouth at bedtime. 30 capsule 11  ? sodium chloride (OCEAN) 0.65 % SOLN nasal spray Place 1 spray into both nostrils as needed for congestion.    ? testosterone cypionate (DEPOTESTOSTERONE CYPIONATE) 200  MG/ML injection Inject 200 mg into the muscle. Give 1 ml every 10 days    ? traMADol (ULTRAM) 50 MG tablet Take 50 mg by mouth every 6 (six) hours as needed.    ? vitamin E 400 UNIT capsule Take 400 Units by mouth daily.    ? ?No current facility-administered medications on file prior to visit.  ? ? ?LABS/IMAGING: ?No results found for this or any previous visit (from the past 48 hour(s)). ?No results found. ? ?LIPID PANEL: ?   ?Component Value Date/Time  ? CHOL 154 04/14/2021 0000  ? TRIG 70 04/14/2021 0000  ? HDL 35 04/14/2021 0000  ? [place] 105 04/14/2021 0000  ? ? ?WEIGHTS: ?Wt Readings from Last 3 Encounters:  ?12/22/21 262 lb 6.4 oz (119 kg)  ?12/07/21 254 lb 12.8 oz (115.6 kg)  ?11/17/21 258 lb (117 kg)  ? ? ?VITALS: ?BP 126/82   Pulse 87   Ht '5\' 11"'$  (1.803 m)   Wt 262 lb 6.4 oz (119 kg)   SpO2 98%   BMI 36.60 kg/m?  ? ?EXAM: ?Deferred ? ?EKG: ?Deferred ? ?ASSESSMENT: ?Mixed dyslipidemia, metabolic pattern ?Low testosterone ?Vitamin D deficiency ?Hypopituitarism (selective, not on steroids) ?Coronary artery calcification/atherosclerosis ?Family history of heart disease and stroke ? ?PLAN: ?1.   Roger Guerra has had essentially no change in his lipid profile over the past 6 months.  He remains above target for patient with coronary artery disease and atherosclerosis.  Would recommend further lipid-lowering and I think is a [facility] at [facility].  We will provide a sample for today and reach out for prior authorization.  He can take that with the Zetia and possibly combine the 2 pills in the future.  Repeat lipids in about 3 to 4 months and follow-up with me at that time. ? ?Pixie Casino, MD,  [facility], FACP  ?[place]  ?Medical Director of the Advanced Lipid Disorders &  ?Cardiovascular Risk Reduction Clinic ?Diplomate of the [employer] of Clinical Lipidology ?Attending Cardio

## 2021-12-22 NOTE — Patient Instructions (Addendum)
Medication Instructions:  ?START Nexletol '180mg'$  daily ?Continue other current medications ? ?Patient Assistance:   ?The PAN Foundation: https://www.panfoundation.org/disease-funds/hypercholesterolemia/ ?-- can sign up for wait list ? ?The [employer] Well foundation offers assistance to help pay for medication copays.  They will cover copays for all cholesterol lowering meds, including statins, fibrates, omega-3 fish oils like Vascepa, ezetimibe, Repatha, Praluent, Nexletol, Nexlizet.  The cards are usually good for $2,500 or 12 months, whichever comes first. ?Go to healthwellfoundation.org ?Click on ?Apply Now? ?Answer questions as to whom is applying (patient or representative) ?Your disease fund will be ?hypercholesterolemia - Medicare access? ?They will ask questions about finances and which medications you are taking for cholesterol ?When you submit, the approval is usually within minutes.  You will need to print the card information from the site ?You will need to show this information to your pharmacy, they will bill your Medicare Part D plan first -then bill Health Well --for the copay.   ?You can also call them at [phone], although the hold times can be quite long.  ? ? ? ? ?*If you need a refill on your cardiac medications before your next appointment, please call your pharmacy* ? ? ?Lab Work: ?FASTING lab work to check cholesterol in about 4 months -- NMR lipoprofile  ? ?If you have labs (blood work) drawn today and your tests are completely normal, you will receive your results only by: ?MyChart Message (if you have MyChart) OR ?A paper copy in the mail ?If you have any lab test that is abnormal or we need to change your treatment, we will call you to review the results. ? ? ?Testing/Procedures: ?NONE ? ? ?Follow-Up: ?At [facility], you and your health needs are our priority.  As part of our continuing mission to provide you with exceptional heart care, we have created designated Provider Care Teams.   These Care Teams include your primary Cardiologist (physician) and Advanced Practice Providers (APPs -  Physician Assistants and Nurse Practitioners) who all work together to provide you with the care you need, when you need it. ? ?We recommend signing up for the patient portal called "MyChart".  Sign up information is provided on this After Visit Summary.  MyChart is used to connect with patients for Virtual Visits (Telemedicine).  Patients are able to view lab/test results, encounter notes, upcoming appointments, etc.  Non-urgent messages can be sent to your provider as well.   ?To learn more about what you can do with MyChart, go to [email or web address].   ? ?Your next appointment:   ?4 month(s) ? ?The format for your next appointment:   ?In Person or Virtual ? ?Provider:   ?Dr. Debara Pickett  ? ?

## 2021-12-25 ENCOUNTER — Encounter: Payer: Self-pay | Admitting: Internal Medicine

## 2021-12-28 MED ORDER — PRALUENT 75 MG/ML ~~LOC~~ SOAJ: 1.0000 | SUBCUTANEOUS | 3 refills | Status: DC

## 2021-12-28 NOTE — Addendum Note (Signed)
Addended by: Fidel Levy on: 12/28/2021 08:37 AM ? ? Modules accepted: Orders ? ?

## 2021-12-28 NOTE — Telephone Encounter (Signed)
PA for Praluent '75mg'$ /mL q2weeks submitted via CMM ?(Key: [id number]) ?

## 2021-12-29 ENCOUNTER — Encounter (INDEPENDENT_AMBULATORY_CARE_PROVIDER_SITE_OTHER): Payer: Self-pay | Admitting: Bariatrics

## 2021-12-29 ENCOUNTER — Ambulatory Visit (INDEPENDENT_AMBULATORY_CARE_PROVIDER_SITE_OTHER): Payer: Medicare HMO | Admitting: Bariatrics

## 2021-12-29 VITALS — BP 126/83 | HR 88 | Temp 97.4°F | Ht 71.0 in | Wt 252.0 lb

## 2021-12-29 DIAGNOSIS — Z6836 Body mass index (BMI) 36.0-36.9, adult: Secondary | ICD-10-CM

## 2021-12-29 DIAGNOSIS — Z7984 Long term (current) use of oral hypoglycemic drugs: Secondary | ICD-10-CM

## 2021-12-29 DIAGNOSIS — E785 Hyperlipidemia, unspecified: Secondary | ICD-10-CM | POA: Diagnosis not present

## 2021-12-29 DIAGNOSIS — E1169 Type 2 diabetes mellitus with other specified complication: Secondary | ICD-10-CM

## 2021-12-29 MED ORDER — SEMAGLUTIDE (2 MG/DOSE) 8 MG/3ML ~~LOC~~ SOPN: 2.0000 mg | PEN_INJECTOR | SUBCUTANEOUS | 0 refills | Status: DC

## 2021-12-30 NOTE — Progress Notes (Signed)
? ? ? ?Chief Complaint:  ? ?OBESITY ?Roger Guerra is here to discuss his progress with his obesity treatment plan along with follow-up of his obesity related diagnoses. Roger Guerra is on following a lower carbohydrate, vegetable and lean protein rich diet plan and states he is following his eating plan approximately 50% of the time. Roger Guerra states he is swimming for 60 minutes 3 times per week. ? ?Today's visit was #: 15 ?Starting weight: 259 lbs ?Starting date: 02/17/2021 ?Today's weight: 252 lbs ?Today's date: 12/29/2021 ?Total lbs lost to date: 7 lbs ?Total lbs lost since last in-office visit: 6 lbs ? ?Interim History: Roger Guerra is down an additional 6 lbs and doing well overall. 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  ? ?Subjective:  ? ?1. Type 2 diabetes mellitus with other specified complication, without long-term current use of insulin (Roger Guerra) ?Roger Guerra is taking Ozempic as directed. He is also on Metformin. ? ?2. Hyperlipidemia associated with type 2 diabetes mellitus (Roger Guerra) ?Roger Guerra is taking Zetia and he will begin an injection this month.  ? ?Assessment/Plan:  ? ?1. Type 2 diabetes mellitus with other specified complication, without long-term current use of insulin (Roger Guerra) ?We will refill Ozempic 2 mg for 1 month with no refills. Good blood sugar control is important to decrease the likelihood of diabetic complications such as nephropathy, neuropathy, limb loss, blindness, coronary artery disease, and death. Intensive lifestyle modification including diet, exercise and weight loss are the first line of treatment for diabetes.  ? ?- Semaglutide, 2 MG/DOSE, 8 MG/3ML SOPN; Inject 2 mg as directed once a week.  Dispense: 3 mL; Refill: 0 ? ?2. Hyperlipidemia associated with type 2 diabetes mellitus (Roger Guerra) ?Cardiovascular risk and specific lipid/LDL goals reviewed.  Roger Guerra will continue his medications. We discussed several lifestyle modifications today and Roger Guerra will continue to work on diet, exercise and weight loss efforts. Orders  and follow up as documented in patient record.  ? ?Counseling ?Intensive lifestyle modifications are the first line treatment for this issue. ?Dietary changes: Increase soluble fiber. Decrease simple carbohydrates. ?Exercise changes: Moderate to vigorous-intensity aerobic activity 150 minutes per week if tolerated. ?Lipid-lowering medications: see documented in medical record. ? ?3. Obesity with current BMI of 35.2 ?Roger Guerra is currently in the action stage of change. As such, his goal is to continue with weight loss efforts. He has agreed to following a lower carbohydrate, vegetable and lean protein rich diet plan.  ? ?Roger Guerra will continue meal planning and intentional eating. He will stay on his plan. He will increase his water intake.  ? ?Exercise goals:  Roger Guerra will increase exercise.  ? ?Behavioral modification strategies: increasing lean protein intake, decreasing simple carbohydrates, increasing vegetables, increasing water intake, decreasing eating out, no skipping meals, meal planning and cooking strategies, keeping healthy foods in the home, and planning for success. ? ?Roger Guerra has agreed to follow-up with our clinic in 4 weeks with Roger Guerra, Roger Guerra and 8 weeks with Roger Guerra. He was informed of the importance of frequent follow-up visits to maximize his success with intensive lifestyle modifications for his multiple health conditions.  ? ?Objective:  ? ?Blood pressure 126/83, pulse 88, temperature (!) 97.4 ?F (36.3 ?C), height '5\' 11"'$  (1.803 m), weight 252 lb (114.3 kg), SpO2 95 %. ?Body mass index is 35.15 kg/m?. ? ?General: Cooperative, alert, well developed, in no acute distress. ?HEENT: Conjunctivae and lids unremarkable. ?Cardiovascular: Regular rhythm.  ?Lungs: Normal work of breathing. ?Neurologic: No focal deficits.  ? ?Lab Results  ?Component Value Date  ? CREATININE  0.88 10/06/2021  ? BUN 10 10/06/2021  ? NA 140 10/06/2021  ? K 5.1 10/06/2021  ? CL 101 10/06/2021  ? CO2 21 10/06/2021  ? ?Lab Results   ?Component Value Date  ? ALT 42 10/06/2021  ? AST 34 10/06/2021  ? ALKPHOS 52 10/06/2021  ? BILITOT 0.4 10/06/2021  ? ?Lab Results  ?Component Value Date  ? HGBA1C 8.3 (H) 10/06/2021  ? HGBA1C 6.6 04/14/2021  ? HGBA1C 6.8 (H) 02/17/2021  ? HGBA1C 6.0 07/15/2015  ? ?No results found for: INSULIN ?Lab Results  ?Component Value Date  ? TSH 1.470 02/17/2021  ? ?Lab Results  ?Component Value Date  ? CHOL 154 04/14/2021  ? HDL 35 04/14/2021  ? [place] 105 04/14/2021  ? TRIG 70 04/14/2021  ? ?Lab Results  ?Component Value Date  ? VD25OH 33.6 10/06/2021  ? VD25OH 42.2 06/16/2021  ? ?Lab Results  ?Component Value Date  ? WBC 7.2 04/14/2021  ? HGB 17.0 04/14/2021  ? HCT 50 04/14/2021  ? MCV 88.8 08/22/2017  ? PLT 237 04/14/2021  ? ?No results found for: IRON, TIBC, FERRITIN ? ?Attestation Statements:  ? ?Reviewed by clinician on day of visit: allergies, medications, problem list, medical history, surgical history, family history, social history, and previous encounter notes. ? ?I, Lizbeth Bark, RMA, am acting as transcriptionist for [employer], DO. ? ?I have reviewed the above documentation for accuracy and completeness, and I agree with the above. Jearld Lesch, DO ? ?

## 2021-12-31 ENCOUNTER — Encounter (INDEPENDENT_AMBULATORY_CARE_PROVIDER_SITE_OTHER): Payer: Self-pay | Admitting: Bariatrics

## 2021-12-31 ENCOUNTER — Encounter: Payer: Self-pay | Admitting: *Deleted

## 2022-01-05 DIAGNOSIS — H53483 Generalized contraction of visual field, bilateral: Secondary | ICD-10-CM | POA: Diagnosis not present

## 2022-01-06 ENCOUNTER — Other Ambulatory Visit (INDEPENDENT_AMBULATORY_CARE_PROVIDER_SITE_OTHER): Payer: Self-pay | Admitting: Family Medicine

## 2022-01-06 DIAGNOSIS — E1169 Type 2 diabetes mellitus with other specified complication: Secondary | ICD-10-CM

## 2022-01-06 NOTE — Telephone Encounter (Signed)
Dr.Brown 

## 2022-01-07 MED ORDER — METFORMIN HCL 500 MG PO TABS: ORAL_TABLET | ORAL | 0 refills | Status: DC

## 2022-01-07 MED ORDER — GLUCOSE BLOOD VI STRP: ORAL_STRIP | 0 refills | Status: DC

## 2022-01-07 NOTE — Telephone Encounter (Signed)
LAST APPOINTMENT DATE: 12/29/2021 ?NEXT APPOINTMENT DATE: 01/19/2022 ? ? ?[facility] [place] ?[place] ?[place] [address] ?Phone: [phone] Fax: [phone] ? ?Patient is requesting a refill of the following medications: ?Pending Prescriptions:                       Disp   Refills ?  glucose blood test strip                   100 ea*0       ?Sig: Use OneTouch Ultra test strips to check blood sugar ?         once daily. DX:E11.69 ?  metFORMIN (GLUCOPHAGE) 500 MG tablet       270 ta*0       ?Sig: Take 1 tablet in the morning and 2 tablets in the ?         evening. ? ? ?Date last filled: metformin 10/06/2021, strips 07/2021 ?Previously prescribed by [facility] ? ?Lab Results ?     Component                Value               Date                 ?     HGBA1C                   8.3 (H)             10/06/2021           ?     HGBA1C                   6.6                 04/14/2021           ?     HGBA1C                   6.8 (H)             02/17/2021           ?Lab Results ?     Component                Value               Date                 ?     [place]                  105                 04/14/2021           ?     CREATININE               0.88                10/06/2021           ?Lab Results ?     Component                Value               Date                 ?     [place]  33.6                10/06/2021           ?     VD25OH                   42.2                06/16/2021           ? ?BP Readings from Last 3 Encounters: ?12/29/21 : 126/83 ?12/22/21 : 126/82 ?12/14/21 : 129/76 ? ?

## 2022-01-12 ENCOUNTER — Other Ambulatory Visit: Payer: Self-pay | Admitting: Nurse Practitioner

## 2022-01-18 NOTE — Progress Notes (Addendum)
?TeleHealth Visit:  ?This visit was completed with telemedicine (audio/video) technology. ?Roger Guerra has verbally consented to this TeleHealth visit. The patient is located at home, the provider is located at home. The participants in this visit include the listed provider and patient. The visit was conducted today via MyChart video. ? ?OBESITY ?Roger Guerra is here to discuss his progress with his obesity treatment plan along with follow-up of his obesity related diagnoses.  ? ?Today's visit was # 16 ?Starting weight: 259 lbs ?Starting date: 02/17/21 ?Weight at last in office visit: 252 lbs on 12/29/21 ?Total weight loss: 7 lbs at last in office visit on 12/29/21. ?Today's reported weight:  No weight reported. ? ?Nutrition Plan: following a lower carbohydrate, vegetable and lean protein rich diet plan. Not following well. ?Hunger is well controlled. Cravings are poorly controlled.  ?Current exercise: water aerobics 3 days per week. Walks for 20 minutes daily. ? ?Interim History: Roger Guerra has been off plan recently.  He had some candy over Easter which started his cravings for carbohydrates.  He has been traveling and he will be home in a few days and plans to do a few days of fasting (1 meal per day) to get back on track. ?He notes he feels better when he is on plan and has less fluid retention. ?He has been consistent with walking for 20 minutes daily. ?Water intake is good.  He is still having coffee without cream but he misses having it. ?He has a an eye surgery for eyelid ptosis tentatively planned for May 22.  He has lost about 40% of his peripheral vision because of lid ptosis. ? ?He reports severe leg cramps when going back on a ketogenic diet after having carbohydrates. ? ?Assessment/Plan:  ?Type II Diabetes with hyperglycemia without long-term use of insulin ?HgbA1c is not at goal. ?CBGs: Fasting 140s. ?Any episodes of hypoglycemia? no ?Medication(s): Ozempic 2 mg, metformin 500 mg in a.m. and 1000 mg in PM. ? ?Lab  Results  ?Component Value Date  ? HGBA1C 8.3 (H) 10/06/2021  ? HGBA1C 6.6 04/14/2021  ? HGBA1C 6.8 (H) 02/17/2021  ? ?Lab Results  ?Component Value Date  ? [place] 105 04/14/2021  ? CREATININE 0.88 10/06/2021  ? ? ?Plan: ?Continue metformin at current dose ?Refill Ozempic 2 mg weekly. ? ?2. Hyperlipidemia associated with type 2 diabetes ?Hyperlipidemia ?LDL is not at goal.  Goal is 70 per cardiology note on December 22, 2021.  He also has an elevated number of small LDL particles.  He was recently started on Praluent.  He is intolerant of statins.  He is also on Zetia. ?Cardiovascular risk factors: advanced age (older than 22 for men, 5 for women), diabetes mellitus, dyslipidemia, male gender, and obesity (BMI >= 30 kg/m2) ? ?Lab Results  ?Component Value Date  ? CHOL 154 04/14/2021  ? HDL 35 04/14/2021  ? [place] 105 04/14/2021  ? TRIG 70 04/14/2021  ? ?Lab Results  ?Component Value Date  ? ALT 42 10/06/2021  ? AST 34 10/06/2021  ? ALKPHOS 52 10/06/2021  ? BILITOT 0.4 10/06/2021  ? ?The 10-year ASCVD risk score (Arnett DK, et al., 2019) is: 30.3%* ?  Values used to calculate the score: ?    Age: 66 years ?    Sex: Male ?    Is Non-Hispanic African American: No ?    Diabetic: Yes ?    Tobacco smoker: Yes ?    Systolic Blood Pressure: 696 mmHg ?    Is BP treated: No ?  HDL Cholesterol: 35 mg/dL* ?    Total Cholesterol: 149 mg/dL ?    * - Cholesterol units were assumed for this score calculation ? ?Plan: ?Follow-up with cardiology as directed.  Continue Praluent and Zetia as directed. ? ? ?3.  Obesity: Current BMI 35.16 ?Roger Guerra is currently in the action stage of change. As such, his goal is to continue with weight loss efforts.  ?He has agreed to following a lower carbohydrate, vegetable and lean protein rich diet plan.  ? ?Exercise goals: Continue current regimen ? ?Behavioral modification strategies: increasing lean protein intake and decreasing simple carbohydrates. ?Encouraged him to try drinking Gatorade or  Powerade 0 when he gets back to his low-carb plan with hopes of reducing leg cramps.  He may also take oral magnesium. ? ? ?Roger Guerra has agreed to follow-up with our clinic in 4 weeks.  ? ?No orders of the defined types were placed in this encounter. ? ? ?Medications Discontinued During This Encounter  ?Medication Reason  ? Semaglutide, 2 MG/DOSE, 8 MG/3ML SOPN Reorder  ?  ? ?Meds ordered this encounter  ?Medications  ? Semaglutide, 2 MG/DOSE, 8 MG/3ML SOPN  ?  Sig: Inject 2 mg as directed once a week.  ?  Dispense:  3 mL  ?  Refill:  0  ?  Order Specific Question:   Supervising Provider  ?  Answer:   Dennard Nip D [[id number]]  ?   ? ?Objective:  ? ?VITALS: Per patient if applicable, see vitals. ?GENERAL: Alert and in no acute distress. ?CARDIOPULMONARY: No increased WOB. Speaking in clear sentences.  ?PSYCH: Pleasant and cooperative. Speech normal rate and rhythm. Affect is appropriate. Insight and judgement are appropriate. Attention is focused, linear, and appropriate.  ?NEURO: Oriented as arrived to appointment on time with no prompting.  ? ?Lab Results  ?Component Value Date  ? CREATININE 0.88 10/06/2021  ? BUN 10 10/06/2021  ? NA 140 10/06/2021  ? K 5.1 10/06/2021  ? CL 101 10/06/2021  ? CO2 21 10/06/2021  ? ?Lab Results  ?Component Value Date  ? ALT 42 10/06/2021  ? AST 34 10/06/2021  ? ALKPHOS 52 10/06/2021  ? BILITOT 0.4 10/06/2021  ? ?Lab Results  ?Component Value Date  ? HGBA1C 8.3 (H) 10/06/2021  ? HGBA1C 6.6 04/14/2021  ? HGBA1C 6.8 (H) 02/17/2021  ? HGBA1C 6.0 07/15/2015  ? ?No results found for: INSULIN ?Lab Results  ?Component Value Date  ? TSH 1.470 02/17/2021  ? ?Lab Results  ?Component Value Date  ? CHOL 154 04/14/2021  ? HDL 35 04/14/2021  ? [place] 105 04/14/2021  ? TRIG 70 04/14/2021  ? ?Lab Results  ?Component Value Date  ? WBC 7.2 04/14/2021  ? HGB 17.0 04/14/2021  ? HCT 50 04/14/2021  ? MCV 88.8 08/22/2017  ? PLT 237 04/14/2021  ? ?No results found for: IRON, TIBC, FERRITIN ?Lab Results   ?Component Value Date  ? VD25OH 33.6 10/06/2021  ? VD25OH 42.2 06/16/2021  ? ? ?Attestation Statements:  ? ?Reviewed by clinician on day of visit: allergies, medications, problem list, medical history, surgical history, family history, social history, and previous encounter notes. ? ?

## 2022-01-19 ENCOUNTER — Telehealth (INDEPENDENT_AMBULATORY_CARE_PROVIDER_SITE_OTHER): Payer: Medicare HMO | Admitting: Family Medicine

## 2022-01-19 ENCOUNTER — Encounter (INDEPENDENT_AMBULATORY_CARE_PROVIDER_SITE_OTHER): Payer: Self-pay | Admitting: Family Medicine

## 2022-01-19 DIAGNOSIS — Z6836 Body mass index (BMI) 36.0-36.9, adult: Secondary | ICD-10-CM

## 2022-01-19 DIAGNOSIS — E669 Obesity, unspecified: Secondary | ICD-10-CM | POA: Diagnosis not present

## 2022-01-19 DIAGNOSIS — Z7985 Long-term (current) use of injectable non-insulin antidiabetic drugs: Secondary | ICD-10-CM

## 2022-01-19 DIAGNOSIS — E1165 Type 2 diabetes mellitus with hyperglycemia: Secondary | ICD-10-CM | POA: Diagnosis not present

## 2022-01-19 DIAGNOSIS — E1169 Type 2 diabetes mellitus with other specified complication: Secondary | ICD-10-CM

## 2022-01-19 DIAGNOSIS — Z6835 Body mass index (BMI) 35.0-35.9, adult: Secondary | ICD-10-CM | POA: Diagnosis not present

## 2022-01-19 DIAGNOSIS — E785 Hyperlipidemia, unspecified: Secondary | ICD-10-CM

## 2022-01-19 MED ORDER — SEMAGLUTIDE (2 MG/DOSE) 8 MG/3ML ~~LOC~~ SOPN: 2.0000 mg | PEN_INJECTOR | SUBCUTANEOUS | 0 refills | Status: DC

## 2022-02-08 DIAGNOSIS — M9902 Segmental and somatic dysfunction of thoracic region: Secondary | ICD-10-CM | POA: Diagnosis not present

## 2022-02-08 DIAGNOSIS — M546 Pain in thoracic spine: Secondary | ICD-10-CM | POA: Diagnosis not present

## 2022-02-08 DIAGNOSIS — M542 Cervicalgia: Secondary | ICD-10-CM | POA: Diagnosis not present

## 2022-02-08 DIAGNOSIS — M9901 Segmental and somatic dysfunction of cervical region: Secondary | ICD-10-CM | POA: Diagnosis not present

## 2022-02-09 ENCOUNTER — Ambulatory Visit: Payer: Medicare HMO | Admitting: Nurse Practitioner

## 2022-02-16 ENCOUNTER — Ambulatory Visit (INDEPENDENT_AMBULATORY_CARE_PROVIDER_SITE_OTHER): Payer: Medicare HMO | Admitting: Bariatrics

## 2022-02-18 ENCOUNTER — Encounter: Payer: Self-pay | Admitting: Nurse Practitioner

## 2022-02-18 ENCOUNTER — Ambulatory Visit (INDEPENDENT_AMBULATORY_CARE_PROVIDER_SITE_OTHER): Payer: Medicare HMO | Admitting: Nurse Practitioner

## 2022-02-18 VITALS — BP 130/85 | HR 93 | Temp 98.4°F | Resp 20 | Ht 71.0 in | Wt 260.0 lb

## 2022-02-18 DIAGNOSIS — E119 Type 2 diabetes mellitus without complications: Secondary | ICD-10-CM | POA: Diagnosis not present

## 2022-02-18 DIAGNOSIS — Z01818 Encounter for other preprocedural examination: Secondary | ICD-10-CM

## 2022-02-18 DIAGNOSIS — Z136 Encounter for screening for cardiovascular disorders: Secondary | ICD-10-CM | POA: Diagnosis not present

## 2022-02-18 LAB — BAYER DCA HB A1C WAIVED: HB A1C (BAYER DCA - WAIVED): 7.4 % — ABNORMAL HIGH (ref 4.8–5.6)

## 2022-02-18 MED ORDER — TESTOSTERONE CYPIONATE 200 MG/ML IM SOLN: 200.0000 mg | INTRAMUSCULAR | 3 refills | Status: DC

## 2022-02-18 MED ORDER — LUER LOCK SAFETY SYRINGES 22G X 1" 3 ML MISC
1 refills | Status: AC
Start: 2022-02-18 — End: ?

## 2022-02-18 NOTE — Patient Instructions (Signed)
Exercising to Stay Healthy To become healthy and stay healthy, it is recommended that you do moderate-intensity and vigorous-intensity exercise. You can tell that you are exercising at a moderate intensity if your heart starts beating faster and you start breathing faster but can still hold a conversation. You can tell that you are exercising at a vigorous intensity if you are breathing much harder and faster and cannot hold a conversation while exercising. How can exercise benefit me? Exercising regularly is important. It has many health benefits, such as: Improving overall fitness, flexibility, and endurance. Increasing bone density. Helping with weight control. Decreasing body fat. Increasing muscle strength and endurance. Reducing stress and tension, anxiety, depression, or anger. Improving overall health. What guidelines should I follow while exercising? Before you start a new exercise program, talk with your health care provider. Do not exercise so much that you hurt yourself, feel dizzy, or get very short of breath. Wear comfortable clothes and wear shoes with good support. Drink plenty of water while you exercise to prevent dehydration or heat stroke. Work out until your breathing and your heartbeat get faster (moderate intensity). How often should I exercise? Choose an activity that you enjoy, and set realistic goals. Your health care provider can help you make an activity plan that is individually designed and works best for you. Exercise regularly as told by your health care provider. This may include: Doing strength training two times a week, such as: Lifting weights. Using resistance bands. Push-ups. Sit-ups. Yoga. Doing a certain intensity of exercise for a given amount of time. Choose from these options: A total of 150 minutes of moderate-intensity exercise every week. A total of 75 minutes of vigorous-intensity exercise every week. A mix of moderate-intensity and  vigorous-intensity exercise every week. Children, pregnant women, people who have not exercised regularly, people who are overweight, and older adults may need to talk with a health care provider about what activities are safe to perform. If you have a medical condition, be sure to talk with your health care provider before you start a new exercise program. What are some exercise ideas? Moderate-intensity exercise ideas include: Walking 1 mile (1.6 km) in about 15 minutes. Biking. Hiking. Golfing. Dancing. Water aerobics. Vigorous-intensity exercise ideas include: Walking 4.5 miles (7.2 km) or more in about 1 hour. Jogging or running 5 miles (8 km) in about 1 hour. Biking 10 miles (16.1 km) or more in about 1 hour. Lap swimming. Roller-skating or in-line skating. Cross-country skiing. Vigorous competitive sports, such as football, basketball, and soccer. Jumping rope. Aerobic dancing. What are some everyday activities that can help me get exercise? Yard work, such as: Pushing a lawn mower. Raking and bagging leaves. Washing your car. Pushing a stroller. Shoveling snow. Gardening. Washing windows or floors. How can I be more active in my day-to-day activities? Use stairs instead of an elevator. Take a walk during your lunch break. If you drive, park your car farther away from your work or school. If you take public transportation, get off one stop early and walk the rest of the way. Stand up or walk around during all of your indoor phone calls. Get up, stretch, and walk around every 30 minutes throughout the day. Enjoy exercise with a friend. Support to continue exercising will help you keep a regular routine of activity. Where to find more information You can find more information about exercising to stay healthy from: U.S. Department of Health and Human Services: www.hhs.gov Centers for Disease Control and Prevention (  CDC): www.cdc.gov Summary Exercising regularly is  important. It will improve your overall fitness, flexibility, and endurance. Regular exercise will also improve your overall health. It can help you control your weight, reduce stress, and improve your bone density. Do not exercise so much that you hurt yourself, feel dizzy, or get very short of breath. Before you start a new exercise program, talk with your health care provider. This information is not intended to replace advice given to you by your health care provider. Make sure you discuss any questions you have with your health care provider. Document Revised: 01/09/2021 Document Reviewed: 01/09/2021 Elsevier Patient Education  2023 Elsevier Inc.  

## 2022-02-18 NOTE — Progress Notes (Signed)
Subjective:    Patient ID: Roger Guerra, male    DOB: [birth year], 66 y.o.   MRN: [id number]   Chief Complaint: Surgical clearance   HPI Patient is having bil eyelid surgery and is here for surgical clearance. His last hgba1c was >8%. He says [facility] for diabetes control.    Review of Systems  Constitutional:  Negative for diaphoresis.  Eyes:  Negative for pain.  Respiratory:  Negative for shortness of breath.   Cardiovascular:  Negative for chest pain, palpitations and leg swelling.  Gastrointestinal:  Negative for abdominal pain.  Endocrine: Negative for polydipsia.  Skin:  Negative for rash.  Neurological:  Negative for dizziness, weakness and headaches.  Hematological:  Does not bruise/bleed easily.  All other systems reviewed and are negative.     Objective:   Physical Exam Vitals and nursing note reviewed.  Constitutional:      Appearance: Normal appearance. He is well-developed.  HENT:     Head: Normocephalic.     Right Ear: External ear normal.     Left Ear: External ear normal.     Nose: Nose normal.     Mouth/Throat:     Mouth: Mucous membranes are moist.     Pharynx: Oropharynx is clear.  Eyes:     Pupils: Pupils are equal, round, and reactive to light.  Neck:     Thyroid: No thyroid mass or thyromegaly.     Vascular: No carotid bruit or JVD.     Trachea: Phonation normal.  Cardiovascular:     Rate and Rhythm: Normal rate and regular rhythm.     Heart sounds: Normal heart sounds. No murmur heard.   No friction rub. No gallop.  Pulmonary:     Effort: Pulmonary effort is normal. No respiratory distress.     Breath sounds: Normal breath sounds. No wheezing or rales.  Chest:     Chest wall: No tenderness.  Abdominal:     General: Bowel sounds are normal.     Palpations: Abdomen is soft. There is no mass.     Tenderness: There is no abdominal tenderness.  Genitourinary:    Penis: Normal.      Prostate: Normal.   Musculoskeletal:        General: Normal range of motion.     Cervical back: Normal range of motion and neck supple.  Lymphadenopathy:     Cervical: No cervical adenopathy.  Skin:    General: Skin is warm and dry.  Neurological:     Mental Status: He is alert and oriented to person, place, and time.     Cranial Nerves: No cranial nerve deficit.  Psychiatric:        Behavior: Behavior normal.        Thought Content: Thought content normal.        Judgment: Judgment normal.    BP 130/85   Pulse 93   Temp 98.4 F (36.9 C) (Temporal)   Resp 20   Ht _0  (1.803 m)   Wt 260 lb (117.9 kg)   SpO2 95%   BMI 36.26 kg/m   [name], FNP       Assessment & Plan:   Roger Guerra in today with chief complaint of Surgical clearance   1. Pre-operative clearance Labs pending - CBC with Differential/Platelet - CMP14+EGFR - Lipid panel - Bayer DCA Hb A1c Waived - EKG 12-Lead  Meds ordered this encounter  Medications   testosterone cypionate (DEPOTESTOSTERONE CYPIONATE) 200 MG/ML  injection    Sig: Inject 1 mL (200 mg total) into the muscle every 14 (fourteen) days. Give 1 ml every 10 days    Dispense:  10 mL    Refill:  3    Order Specific Question:   Supervising Provider    Answer:   DETTINGER, JOSHUA A [[id number]]   SYRINGE-NEEDLE, DISP, 3 ML ([place]) 22G X 1" 3 ML MISC    Sig: For testosterone  injections    Dispense:  30 each    Refill:  1    Order Specific Question:   Supervising Provider    Answer:   Caryl Pina A [[id number]]     The above assessment and management plan was discussed with the patient. The patient verbalized understanding of and has agreed to the management plan. Patient is aware to call the clinic if symptoms persist or worsen. Patient is aware when to return to the clinic for a follow-up visit. Patient educated on when it is appropriate to go to the emergency department.   Mary-Margaret Hassell Done, FNP

## 2022-02-19 ENCOUNTER — Encounter: Payer: Self-pay | Admitting: Internal Medicine

## 2022-02-19 LAB — CBC WITH DIFFERENTIAL/PLATELET
Basophils Absolute: 0.1 10*3/uL (ref 0.0–0.2)
Basos: 1 %
EOS (ABSOLUTE): 0.4 10*3/uL (ref 0.0–0.4)
Eos: 4 %
Hematocrit: 47.7 % (ref 37.5–51.0)
Hemoglobin: 16.8 g/dL (ref 13.0–17.7)
Immature Grans (Abs): 0.1 10*3/uL (ref 0.0–0.1)
Immature Granulocytes: 1 %
Lymphocytes Absolute: 3.4 10*3/uL — ABNORMAL HIGH (ref 0.7–3.1)
Lymphs: 33 %
MCH: 30.9 pg (ref 26.6–33.0)
MCHC: 35.2 g/dL (ref 31.5–35.7)
MCV: 88 fL (ref 79–97)
Monocytes Absolute: 1 10*3/uL — ABNORMAL HIGH (ref 0.1–0.9)
Monocytes: 10 %
Neutrophils Absolute: 5.3 10*3/uL (ref 1.4–7.0)
Neutrophils: 51 %
Platelets: 293 10*3/uL (ref 150–450)
RBC: 5.44 x10E6/uL (ref 4.14–5.80)
RDW: 13.7 % (ref 11.6–15.4)
WBC: 10.1 10*3/uL (ref 3.4–10.8)

## 2022-02-19 LAB — CMP14+EGFR
ALT: 36 IU/L (ref 0–44)
AST: 21 IU/L (ref 0–40)
Albumin/Globulin Ratio: 1.8 (ref 1.2–2.2)
Albumin: 4.4 g/dL (ref 3.8–4.8)
Alkaline Phosphatase: 51 IU/L (ref 44–121)
BUN/Creatinine Ratio: 10 (ref 10–24)
BUN: 10 mg/dL (ref 8–27)
Bilirubin Total: 0.2 mg/dL (ref 0.0–1.2)
CO2: 23 mmol/L (ref 20–29)
Calcium: 9.7 mg/dL (ref 8.6–10.2)
Chloride: 104 mmol/L (ref 96–106)
Creatinine, Ser: 1.04 mg/dL (ref 0.76–1.27)
Globulin, Total: 2.4 g/dL (ref 1.5–4.5)
Glucose: 187 mg/dL — ABNORMAL HIGH (ref 70–99)
Potassium: 4.5 mmol/L (ref 3.5–5.2)
Sodium: 141 mmol/L (ref 134–144)
Total Protein: 6.8 g/dL (ref 6.0–8.5)
eGFR: 80 mL/min/{1.73_m2} (ref >59)

## 2022-02-19 LAB — LIPID PANEL
Chol/HDL Ratio: 3.2 ratio (ref 0.0–5.0)
Cholesterol, Total: 99 mg/dL — ABNORMAL LOW (ref 100–199)
HDL: 31 mg/dL — ABNORMAL LOW (ref >39)
LDL Chol Calc (NIH): 29 mg/dL (ref 0–99)
Triglycerides: 259 mg/dL — ABNORMAL HIGH (ref 0–149)
VLDL Cholesterol Cal: 39 mg/dL (ref 5–40)

## 2022-02-25 HISTORY — PX: PTOSIS REPAIR: SHX6568

## 2022-02-26 DIAGNOSIS — M546 Pain in thoracic spine: Secondary | ICD-10-CM | POA: Diagnosis not present

## 2022-02-26 DIAGNOSIS — M9901 Segmental and somatic dysfunction of cervical region: Secondary | ICD-10-CM | POA: Diagnosis not present

## 2022-02-26 DIAGNOSIS — M542 Cervicalgia: Secondary | ICD-10-CM | POA: Diagnosis not present

## 2022-02-26 DIAGNOSIS — M9902 Segmental and somatic dysfunction of thoracic region: Secondary | ICD-10-CM | POA: Diagnosis not present

## 2022-03-01 DIAGNOSIS — M5030 Other cervical disc degeneration, unspecified cervical region: Secondary | ICD-10-CM | POA: Insufficient documentation

## 2022-03-01 DIAGNOSIS — M5412 Radiculopathy, cervical region: Secondary | ICD-10-CM | POA: Insufficient documentation

## 2022-03-01 DIAGNOSIS — M542 Cervicalgia: Secondary | ICD-10-CM | POA: Diagnosis not present

## 2022-03-09 DIAGNOSIS — H53483 Generalized contraction of visual field, bilateral: Secondary | ICD-10-CM | POA: Diagnosis not present

## 2022-03-09 DIAGNOSIS — H57813 Brow ptosis, bilateral: Secondary | ICD-10-CM | POA: Diagnosis not present

## 2022-03-09 DIAGNOSIS — H02413 Mechanical ptosis of bilateral eyelids: Secondary | ICD-10-CM | POA: Diagnosis not present

## 2022-03-09 DIAGNOSIS — H02831 Dermatochalasis of right upper eyelid: Secondary | ICD-10-CM | POA: Diagnosis not present

## 2022-03-09 DIAGNOSIS — H02423 Myogenic ptosis of bilateral eyelids: Secondary | ICD-10-CM | POA: Diagnosis not present

## 2022-03-09 DIAGNOSIS — H02834 Dermatochalasis of left upper eyelid: Secondary | ICD-10-CM | POA: Diagnosis not present

## 2022-03-09 DIAGNOSIS — H53453 Other localized visual field defect, bilateral: Secondary | ICD-10-CM | POA: Diagnosis not present

## 2022-03-12 ENCOUNTER — Ambulatory Visit: Payer: Medicare HMO | Admitting: Urology

## 2022-03-15 ENCOUNTER — Encounter (INDEPENDENT_AMBULATORY_CARE_PROVIDER_SITE_OTHER): Payer: Self-pay | Admitting: Bariatrics

## 2022-03-15 ENCOUNTER — Ambulatory Visit (INDEPENDENT_AMBULATORY_CARE_PROVIDER_SITE_OTHER): Payer: Medicare HMO | Admitting: Bariatrics

## 2022-03-15 VITALS — BP 128/80 | HR 92 | Temp 97.4°F | Ht 71.0 in | Wt 251.0 lb

## 2022-03-15 DIAGNOSIS — E1169 Type 2 diabetes mellitus with other specified complication: Secondary | ICD-10-CM | POA: Diagnosis not present

## 2022-03-15 DIAGNOSIS — Z7984 Long term (current) use of oral hypoglycemic drugs: Secondary | ICD-10-CM | POA: Diagnosis not present

## 2022-03-15 DIAGNOSIS — Z6835 Body mass index (BMI) 35.0-35.9, adult: Secondary | ICD-10-CM | POA: Diagnosis not present

## 2022-03-15 DIAGNOSIS — Z7985 Long-term (current) use of injectable non-insulin antidiabetic drugs: Secondary | ICD-10-CM | POA: Diagnosis not present

## 2022-03-15 DIAGNOSIS — E669 Obesity, unspecified: Secondary | ICD-10-CM

## 2022-03-15 DIAGNOSIS — E785 Hyperlipidemia, unspecified: Secondary | ICD-10-CM | POA: Diagnosis not present

## 2022-03-15 DIAGNOSIS — E66812 Obesity, class 2: Secondary | ICD-10-CM

## 2022-03-15 MED ORDER — TRULICITY 4.5 MG/0.5ML ~~LOC~~ SOAJ: 4.5000 mg | SUBCUTANEOUS | 0 refills | Status: DC

## 2022-03-16 DIAGNOSIS — D239 Other benign neoplasm of skin, unspecified: Secondary | ICD-10-CM | POA: Diagnosis not present

## 2022-03-16 DIAGNOSIS — L570 Actinic keratosis: Secondary | ICD-10-CM | POA: Diagnosis not present

## 2022-03-16 DIAGNOSIS — M5412 Radiculopathy, cervical region: Secondary | ICD-10-CM | POA: Diagnosis not present

## 2022-03-16 DIAGNOSIS — Z1283 Encounter for screening for malignant neoplasm of skin: Secondary | ICD-10-CM | POA: Diagnosis not present

## 2022-03-16 NOTE — Progress Notes (Unsigned)
Chief Complaint:   OBESITY Roger Guerra is here to discuss his progress with his obesity treatment plan along with follow-up of his obesity related diagnoses. Fredrico is on following a lower carbohydrate, vegetable and lean protein rich diet plan and states he is following his eating plan approximately 0% of the time. Roger Guerra states he is doing 0 minutes 0 times per week.  Today's visit was #: 69 Starting weight: 259 lbs Starting date: 02/17/2021 Today's weight: 251 lbs Today's date: 03/15/2022 Total lbs lost to date: 8 Total lbs lost since last in-office visit: 1  Interim History: Roger Guerra is down 1 pound since his last visit.  He had eyelid surgery during the interim.  He has been doing quicker foods and less meat.  Subjective:   1. Diabetes mellitus type 2 in obese [facility]) Roger Guerra is taking metformin and semaglutide.   2. Hyperlipidemia associated with type 2 diabetes mellitus ([place]) Deral is taking omega-3, FFAs.  Assessment/Plan:   1. Diabetes mellitus type 2 in obese [facility]) Roger Guerra will continue his medications and stop Ozempic due to cost.  He will start Trulicity 4.5 mg once weekly, with no refills.  - Dulaglutide (TRULICITY) 4.5 ZO/1.0RU SOPN; Inject 4.5 mg as directed once a week.  Dispense: 2 mL; Refill: 0  2. Hyperlipidemia associated with type 2 diabetes mellitus ([place]) Roger Guerra will read labels for fats, eliminate trans fats, and keep saturated fats low except for the dairy and unprocessed meats, and chocolate.  3. Obesity with current BMI of 35.1 Roger Guerra is currently in the action stage of change. As such, his goal is to continue with weight loss efforts. He has agreed to following a lower carbohydrate, vegetable and lean protein rich diet plan.   Roger Guerra will keep calories around 2000 and protein around 100+ grams daily, along with low carbohydrates.  He is to decrease his carbohydrates and increase his vegetables and lean protein.  Exercise goals: No exercise has been  prescribed at this time.  Behavioral modification strategies: increasing lean protein intake, decreasing simple carbohydrates, increasing vegetables, increasing water intake, decreasing eating out, no skipping meals, meal planning and cooking strategies, keeping healthy foods in the home, and planning for success.  Adem has agreed to follow-up with our clinic in 3 to 4 weeks. He was informed of the importance of frequent follow-up visits to maximize his success with intensive lifestyle modifications for his multiple health conditions.   Objective:   Blood pressure 128/80, pulse 92, temperature (!) 97.4 F (36.3 C), height '5\' 11"'$  (1.803 m), weight 251 lb (113.9 kg), SpO2 95 %. Body mass index is 35.01 kg/m.  General: Cooperative, alert, well developed, in no acute distress. HEENT: Conjunctivae and lids unremarkable. Cardiovascular: Regular rhythm.  Lungs: Normal work of breathing. Neurologic: No focal deficits.   Lab Results  Component Value Date   CREATININE 1.04 02/18/2022   BUN 10 02/18/2022   NA 141 02/18/2022   K 4.5 02/18/2022   CL 104 02/18/2022   CO2 23 02/18/2022   Lab Results  Component Value Date   ALT 36 02/18/2022   AST 21 02/18/2022   ALKPHOS 51 02/18/2022   BILITOT 0.2 02/18/2022   Lab Results  Component Value Date   HGBA1C 7.4 (H) 02/18/2022   HGBA1C 8.3 (H) 10/06/2021   HGBA1C 6.6 04/14/2021   HGBA1C 6.8 (H) 02/17/2021   HGBA1C 6.0 07/15/2015   No results found for: "INSULIN" Lab Results  Component Value Date   TSH 1.470 02/17/2021   Lab  Results  Component Value Date   CHOL 99 (L) 02/18/2022   HDL 31 (L) 02/18/2022   LDLCALC 29 02/18/2022   TRIG 259 (H) 02/18/2022   CHOLHDL 3.2 02/18/2022   Lab Results  Component Value Date   VD25OH 33.6 10/06/2021   VD25OH 42.2 06/16/2021   Lab Results  Component Value Date   WBC 10.1 02/18/2022   HGB 16.8 02/18/2022   HCT 47.7 02/18/2022   MCV 88 02/18/2022   PLT 293 02/18/2022   No results  found for: "IRON", "TIBC", "FERRITIN"  Attestation Statements:   Reviewed by clinician on day of visit: allergies, medications, problem list, medical history, surgical history, family history, social history, and previous encounter notes.   Wilhemena Durie, am acting as Location manager for [employer], DO.  I have reviewed the above documentation for accuracy and completeness, and I agree with the above. -  ***

## 2022-03-17 ENCOUNTER — Encounter: Payer: Self-pay | Admitting: Urology

## 2022-03-17 ENCOUNTER — Ambulatory Visit: Payer: Medicare HMO | Admitting: Urology

## 2022-03-17 VITALS — BP 137/81 | HR 94 | Ht 71.0 in | Wt 251.0 lb

## 2022-03-17 DIAGNOSIS — K649 Unspecified hemorrhoids: Secondary | ICD-10-CM | POA: Diagnosis not present

## 2022-03-17 DIAGNOSIS — N401 Enlarged prostate with lower urinary tract symptoms: Secondary | ICD-10-CM | POA: Diagnosis not present

## 2022-03-17 DIAGNOSIS — N138 Other obstructive and reflux uropathy: Secondary | ICD-10-CM

## 2022-03-17 DIAGNOSIS — R350 Frequency of micturition: Secondary | ICD-10-CM

## 2022-03-17 DIAGNOSIS — R39198 Other difficulties with micturition: Secondary | ICD-10-CM

## 2022-03-17 DIAGNOSIS — R339 Retention of urine, unspecified: Secondary | ICD-10-CM

## 2022-03-17 LAB — URINALYSIS, ROUTINE W REFLEX MICROSCOPIC
Bilirubin, UA: NEGATIVE
Ketones, UA: NEGATIVE
Leukocytes,UA: NEGATIVE
Nitrite, UA: NEGATIVE
Protein,UA: NEGATIVE
RBC, UA: NEGATIVE
Specific Gravity, UA: <1.005 — ABNORMAL LOW (ref 1.005–1.030)
Urobilinogen, Ur: 0.2 mg/dL (ref 0.2–1.0)
pH, UA: 6 (ref 5.0–7.5)

## 2022-03-17 LAB — BLADDER SCAN AMB NON-IMAGING: Scan Result: 405

## 2022-03-17 MED ORDER — SILODOSIN 8 MG PO CAPS
8.0000 mg | ORAL_CAPSULE | Freq: Two times a day (BID) | ORAL | 3 refills | Status: DC
Start: 2022-03-17 — End: 2022-09-08

## 2022-03-17 NOTE — Patient Instructions (Signed)

## 2022-03-17 NOTE — Progress Notes (Unsigned)
post void residual =425m

## 2022-03-17 NOTE — Progress Notes (Unsigned)
03/17/2022 1:37 PM   Roger Guerra 29-Sep-1955 [id number]  Referring provider: Chevis Pretty, [address] [place],  [place] [address]  No chief complaint on file.   HPI:    PMH: Past Medical History:  Diagnosis Date   Anxiety disorder    Arthritis    Atherosclerosis    Back pain    Constipation    Coronary artery calcification seen on CT scan    Depression    Diabetes (HCC)    Erectile dysfunction    GERD (gastroesophageal reflux disease)    Heartburn    History of blood clots    History of DVT (deep vein thrombosis)    History of DVT of lower extremity    Right leg 2002    History of pneumonia    Hyperlipidemia    Hypogonadism in male    Hypopituitarism ([place])    Joint pain    Nerve root and plexus disorder    Obesity    Obstructive sleep apnea    OSA on CPAP    Osteoarthritis    Sciatica    Sleep apnea    SOBOE (shortness of breath on exertion)    Spinal stenosis    Spondylosis    Swelling of both lower extremities    Type 2 diabetes mellitus (HCC)    Vitamin D deficiency     Surgical History: Past Surgical History:  Procedure Laterality Date   BLADDER SURGERY  2015   removal of skin tag near urethra   COLONOSCOPY N/A 12/10/2016   Procedure: COLONOSCOPY;  Surgeon: Daneil Dolin, MD;  Location: [facility];  Service: Endoscopy;  Laterality: N/A;  8:30 AM   KNEE ARTHROSCOPY WITH MEDIAL MENISECTOMY Right 08/25/2017   Procedure: Right knee arthroscopy, partial medial menisectomy and debridement;  Surgeon: Susa Day, MD;  Location: [facility];  Service: Orthopedics;  Laterality: Right;  60 mins   POLYPECTOMY  12/10/2016   Procedure: POLYPECTOMY;  Surgeon: Daneil Dolin, MD;  Location: [facility];  Service: Endoscopy;;  hepatic flexure   TONSILLECTOMY      Home Medications:  Allergies as of 03/17/2022       Reactions   Invokana [canagliflozin] Other (See Comments)   Yeast infection   Statins Other (See Comments)   Severe  muscle cramps        Medication List        Accurate as of March 17, 2022  1:37 PM. If you have any questions, ask your nurse or doctor.          5-HTP PO Take by mouth.   AMBULATORY NON FORMULARY MEDICATION 0.2 mLs by Intracavernosal route as needed. Medication Name: Trimix  PGE 5mg Pap '30mg'$  Phent '1mg'$    AMBULATORY NON FORMULARY MEDICATION Medication Name: Trimix  PGE 30 mcg Pap 30 mg Phent 1 mg   aspirin 81 MG tablet Take 81 mg by mouth daily.   carisoprodol 350 MG tablet Commonly known as: SOMA TAKE (1) TABLET BY MOUTH THREE TIMES DAILYAS NEEDED.   cetirizine 10 MG tablet Commonly known as: ZYRTEC Take 10 mg by mouth every evening.   co-enzyme Q-10 30 MG capsule Take 30 mg by mouth daily.   ezetimibe 10 MG tablet Commonly known as: ZETIA Take 1 tablet (10 mg total) by mouth daily.   fexofenadine 180 MG tablet Commonly known as: ALLEGRA Take 180 mg by mouth every morning.   FISH OIL PO Take 1 capsule by mouth daily.   glucose  blood test strip Use OneTouch Ultra test strips to check blood sugar once daily. DX:E11.69   ibuprofen 800 MG tablet Commonly known as: ADVIL Take 1 tablet (800 mg total) by mouth in the morning and at bedtime.   Luer Lock Safety Syringes 22G X 1" 3 ML Misc Generic drug: SYRINGE-NEEDLE (DISP) 3 ML For testosterone  injections   metFORMIN 500 MG tablet Commonly known as: GLUCOPHAGE Take 1 tablet in the morning and 2 tablets in the evening.   multivitamin with minerals Tabs tablet Take 1 tablet by mouth daily.   omeprazole 20 MG tablet Commonly known as: PRILOSEC OTC Take 1 tablet (20 mg total) by mouth daily as needed (acid reflux).   ondansetron 4 MG tablet Commonly known as: Zofran Take 1 tablet (4 mg total) by mouth every 8 (eight) hours as needed for nausea or vomiting.   Praluent 75 MG/ML Soaj Generic drug: Alirocumab Inject 1 Dose into the skin every 14 (fourteen) days.   PRESCRIPTION  MEDICATION Inject 1 Dose as directed as needed (erectile dysfunction). Trimix Papaverine '30mg'$ /ml Phentolamine '1mg'$ /ml Prostaglandin 7mg/ml   silodosin 8 MG Caps capsule Commonly known as: RAPAFLO Take 1 capsule (8 mg total) by mouth at bedtime.   sodium chloride 0.65 % Soln nasal spray Commonly known as: OCEAN Place 1 spray into both nostrils as needed for congestion.   testosterone cypionate 200 MG/ML injection Commonly known as: DEPOTESTOSTERONE CYPIONATE Inject 1 mL (200 mg total) into the muscle every 14 (fourteen) days. Give 1 ml every 10 days   Trulicity 4.5 MCB/6.3AGSopn Generic drug: Dulaglutide Inject 4.5 mg as directed once a week.   vitamin E 180 MG (400 UNITS) capsule Take 400 Units by mouth daily.   ZINC PO Take by mouth.        Allergies:  Allergies  Allergen Reactions   Invokana [Canagliflozin] Other (See Comments)    Yeast infection   Statins Other (See Comments)    Severe muscle cramps    Family History: Family History  Problem Relation Age of Onset   Diabetes Mother    Lung cancer Father    Heart disease Father    Stroke Father    Cancer Father    Diabetes Sister    Diabetes Other        Siblings    Social History:  reports that he has quit smoking. His smoking use included cigarettes. He has a 37.50 pack-year smoking history. He has never used smokeless tobacco. He reports that he does not drink alcohol and does not use drugs.  ROS: All other review of systems were reviewed and are negative except what is noted above in HPI  Physical Exam: BP 137/81   Pulse 94   Ht '5\' 11"'$  (1.803 m)   Wt 251 lb (113.9 kg)   BMI 35.01 kg/m   Constitutional:  Alert and oriented, No acute distress. HEENT: [place] AT, moist mucus membranes.  Trachea midline, no masses. Cardiovascular: No clubbing, cyanosis, or edema. Respiratory: Normal respiratory effort, no increased work of breathing. GI: Abdomen is soft, nontender, nondistended, no abdominal masses GU:  No CVA tenderness.  Lymph: No cervical or inguinal lymphadenopathy. Skin: No rashes, bruises or suspicious lesions. Neurologic: Grossly intact, no focal deficits, moving all 4 extremities. Psychiatric: Normal mood and affect.  Laboratory Data: Lab Results  Component Value Date   WBC 10.1 02/18/2022   HGB 16.8 02/18/2022   HCT 47.7 02/18/2022   MCV 88 02/18/2022   PLT 293 02/18/2022  Lab Results  Component Value Date   CREATININE 1.04 02/18/2022    No results found for: "PSA"  Lab Results  Component Value Date   TESTOSTERONE 592 06/16/2021    Lab Results  Component Value Date   HGBA1C 7.4 (H) 02/18/2022    Urinalysis    Component Value Date/Time   APPEARANCEUR Clear 12/14/2021 1351   GLUCOSEU Negative 12/14/2021 1351   BILIRUBINUR Negative 12/14/2021 1351   PROTEINUR Negative 12/14/2021 1351   NITRITE Negative 12/14/2021 1351   LEUKOCYTESUR Negative 12/14/2021 1351    Lab Results  Component Value Date   LABMICR Comment 12/14/2021    Pertinent Imaging: *** No results found for this or any previous visit.  No results found for this or any previous visit.  No results found for this or any previous visit.  No results found for this or any previous visit.  No results found for this or any previous visit.  No results found for this or any previous visit.  No results found for this or any previous visit.  No results found for this or any previous visit.   Assessment & Plan:    1.  Incomplete bladder emptying -increase rapaflo '8mg'$  to BID - BLADDER SCAN AMB NON-IMAGING - Urinalysis, Routine w reflex microscopic  2. Benign prostatic hyperplasia with urinary obstruction -increase rapaflo '8mg'$  BID  4. Urinary frequency ***   No follow-ups on file.  Nicolette Bang, MD  [facility] [place]

## 2022-03-22 ENCOUNTER — Encounter: Payer: Self-pay | Admitting: *Deleted

## 2022-03-25 ENCOUNTER — Encounter: Payer: Self-pay | Admitting: *Deleted

## 2022-03-25 NOTE — Progress Notes (Signed)
Last colonoscopy in March 2018 with 5 mm tubular adenoma.  Recommended 5-year repeat.  Okay to schedule.  ASA 2.  1 day prior to procedure: Take metformin as prescribed. Day of procedure: No morning diabetes medications.  No need to adjust Trulicity as this is a once weekly medication.

## 2022-03-25 NOTE — Patient Instructions (Signed)
Referring MD/PCP: Mary-Margaret Hassell Done  Procedure: Colonoscopy  Has patient had this procedure before?  12/10/16, Dr. Gala Romney  If so, when, by whom and where?    Is there a family history of colon cancer?  no  Who?  What age when diagnosed?    Is patient diabetic? If yes, Type 1 or Type 2   yes, type 2      Does patient have prosthetic heart valve or mechanical valve?  no  Do you have a pacemaker/defibrillator?  no  Has patient ever had endocarditis/atrial fibrillation? no  Does patient use oxygen? no  Has patient had joint replacement within last 12 months?  no  Is patient constipated or do they take laxatives? no  Does patient have a history of alcohol/drug use?  no  Have you had a stroke/heart attack last 6 mths? no  Do you take medicine for weight loss?  no  Is patient on blood thinner such as Coumadin, Plavix and/or Aspirin? Yes, aspirin '81mg'$   Medications:  Current Outpatient Medications on File Prior to Visit  Medication Sig Dispense Refill   5-Hydroxytryptophan (5-HTP PO) Take by mouth.     Alirocumab (PRALUENT) 75 MG/ML SOAJ Inject 1 Dose into the skin every 14 (fourteen) days. 6 mL 3   AMBULATORY NON FORMULARY MEDICATION 0.2 mLs by Intracavernosal route as needed. Medication Name: Trimix  PGE 1mg Pap '30mg'$  Phent '1mg'$  5 mL 5   AMBULATORY NON FORMULARY MEDICATION Medication Name: Trimix  PGE 30 mcg Pap 30 mg Phent 1 mg 5 mL 5   aspirin 81 MG tablet Take 81 mg by mouth daily.     carisoprodol (SOMA) 350 MG tablet TAKE (1) TABLET BY MOUTH THREE TIMES DAILYAS NEEDED. 30 tablet 0   cetirizine (ZYRTEC) 10 MG tablet Take 10 mg by mouth every evening.      co-enzyme Q-10 30 MG capsule Take 30 mg by mouth daily.     Dulaglutide (TRULICITY) 4.5 MRU/0.4VWSOPN Inject 4.5 mg as directed once a week. 2 mL 0   ezetimibe (ZETIA) 10 MG tablet Take 1 tablet (10 mg total) by mouth daily. 90 tablet 1   fexofenadine (ALLEGRA) 180 MG tablet Take 180 mg by mouth every morning.       glucose blood test strip Use OneTouch Ultra test strips to check blood sugar once daily. DX:E11.69 100 each 0   ibuprofen (ADVIL) 800 MG tablet Take 1 tablet (800 mg total) by mouth in the morning and at bedtime. 30 tablet 1   metFORMIN (GLUCOPHAGE) 500 MG tablet Take 1 tablet in the morning and 2 tablets in the evening. 270 tablet 0   Multiple Vitamin (MULTIVITAMIN WITH MINERALS) TABS tablet Take 1 tablet by mouth daily.     Multiple Vitamins-Minerals (ZINC PO) Take by mouth.     Omega-3 Fatty Acids (FISH OIL PO) Take 1 capsule by mouth daily.     omeprazole (PRILOSEC OTC) 20 MG tablet Take 1 tablet (20 mg total) by mouth daily as needed (acid reflux). 90 tablet 1   ondansetron (ZOFRAN) 4 MG tablet Take 1 tablet (4 mg total) by mouth every 8 (eight) hours as needed for nausea or vomiting. 20 tablet 1   PRESCRIPTION MEDICATION Inject 1 Dose as directed as needed (erectile dysfunction). Trimix Papaverine '30mg'$ /ml Phentolamine '1mg'$ /ml Prostaglandin 177m/ml     silodosin (RAPAFLO) 8 MG CAPS capsule Take 1 capsule (8 mg total) by mouth in the morning and at bedtime. 180 capsule 3   sodium chloride (OCEAN)  0.65 % SOLN nasal spray Place 1 spray into both nostrils as needed for congestion.     SYRINGE-NEEDLE, DISP, 3 ML (LUER LOCK SAFETY SYRINGES) 22G X 1" 3 ML MISC For testosterone  injections 30 each 1   testosterone cypionate (DEPOTESTOSTERONE CYPIONATE) 200 MG/ML injection Inject 1 mL (200 mg total) into the muscle every 14 (fourteen) days. Give 1 ml every 10 days 10 mL 3   vitamin E 400 UNIT capsule Take 400 Units by mouth daily.     No current facility-administered medications on file prior to visit.     Allergies:  Allergies  Allergen Reactions   Invokana [Canagliflozin] Other (See Comments)    Yeast infection   Statins Other (See Comments)    Severe muscle cramps     Estimated body mass index is 35.01 kg/m as calculated from the following:   Height as of 03/17/22: '5\' 11"'$  (1.803 m).    Weight as of 03/17/22: 251 lb (113.9 kg).

## 2022-03-26 ENCOUNTER — Encounter: Payer: Self-pay | Admitting: *Deleted

## 2022-03-26 MED ORDER — PEG 3350-KCL-NA BICARB-NACL 420 G PO SOLR: ORAL | 0 refills | Status: DC

## 2022-03-26 NOTE — Progress Notes (Signed)
Pt scheduled for 8/3 at 8am. Instructions mailed. Rx sent

## 2022-04-06 DIAGNOSIS — M5412 Radiculopathy, cervical region: Secondary | ICD-10-CM | POA: Diagnosis not present

## 2022-04-12 ENCOUNTER — Encounter (INDEPENDENT_AMBULATORY_CARE_PROVIDER_SITE_OTHER): Payer: Self-pay | Admitting: Bariatrics

## 2022-04-12 ENCOUNTER — Ambulatory Visit (INDEPENDENT_AMBULATORY_CARE_PROVIDER_SITE_OTHER): Payer: Medicare HMO | Admitting: Bariatrics

## 2022-04-12 VITALS — BP 126/78 | HR 89 | Temp 97.6°F | Ht 71.0 in | Wt 244.0 lb

## 2022-04-12 DIAGNOSIS — Z6834 Body mass index (BMI) 34.0-34.9, adult: Secondary | ICD-10-CM

## 2022-04-12 DIAGNOSIS — Z7984 Long term (current) use of oral hypoglycemic drugs: Secondary | ICD-10-CM | POA: Diagnosis not present

## 2022-04-12 DIAGNOSIS — E669 Obesity, unspecified: Secondary | ICD-10-CM

## 2022-04-12 DIAGNOSIS — Z6836 Body mass index (BMI) 36.0-36.9, adult: Secondary | ICD-10-CM

## 2022-04-12 DIAGNOSIS — Z7985 Long-term (current) use of injectable non-insulin antidiabetic drugs: Secondary | ICD-10-CM | POA: Diagnosis not present

## 2022-04-12 DIAGNOSIS — E1169 Type 2 diabetes mellitus with other specified complication: Secondary | ICD-10-CM

## 2022-04-12 DIAGNOSIS — E785 Hyperlipidemia, unspecified: Secondary | ICD-10-CM | POA: Diagnosis not present

## 2022-04-13 DIAGNOSIS — E7849 Other hyperlipidemia: Secondary | ICD-10-CM | POA: Diagnosis not present

## 2022-04-13 NOTE — Progress Notes (Signed)
Chief Complaint:   OBESITY Roger Guerra is here to discuss his progress with his obesity treatment plan along with follow-up of his obesity related diagnoses. Roger Guerra is on keeping a food journal and adhering to recommended goals of 2000 calories and 100+ grams of protein daily and states he is following his eating plan approximately 50% of the time. Roger Guerra states he is doing 0 minutes 0 times per week.  Today's visit was #: 18 Starting weight: 259 lbs Starting date: 02/17/2021 Today's weight: 244 lbs Today's date: 04/12/2022 Total lbs lost to date: 15 Total lbs lost since last in-office visit: 6  Interim History: Roger Guerra is down another 6 pounds since his last visit.  Subjective:   1. Type 2 diabetes mellitus with other specified complication, without long-term current use of insulin (HCC) Roger Guerra is taking metformin and Trulicity.  His fasting blood sugars range between 100-105's.   2. Hyperlipidemia associated with type 2 diabetes mellitus (HCC) Roger Guerra is working on decreasing carbohydrates.  Assessment/Plan:   1. Type 2 diabetes mellitus with other specified complication, without long-term current use of insulin (HCC) Roger Guerra agreed to discontinue metformin due to GI upset, and he will continue Trulicity.  Can use if needed for cravings.  2. Hyperlipidemia associated with type 2 diabetes mellitus (HCC) Roger Guerra will continue to follow a low carbohydrate plan.  3. Obesity with current BMI of 34.1 Roger Guerra is currently in the action stage of change. As such, his goal is to continue with weight loss efforts. He has agreed to keeping a food journal and adhering to recommended goals of 2000 calories and 100+ grams of protein daily.   Meal planning and intentional eating were discussed.  He is to increase his water intake and decrease salt/magnesium.  Keep carbohydrates around 20.  Exercise goals: No exercise has been prescribed at this time.  Behavioral modification strategies:  increasing lean protein intake, decreasing simple carbohydrates, increasing vegetables, increasing water intake, decreasing eating out, no skipping meals, meal planning and cooking strategies, keeping healthy foods in the home, and planning for success.  Roger Guerra has agreed to follow-up with our clinic in 4 weeks. He was informed of the importance of frequent follow-up visits to maximize his success with intensive lifestyle modifications for his multiple health conditions.   Objective:   Blood pressure 126/78, pulse 89, temperature 97.6 F (36.4 C), height 5\' 11"  (1.803 m), weight 244 lb (110.7 kg), SpO2 98 %. Body mass index is 34.03 kg/m.  General: Cooperative, alert, well developed, in no acute distress. HEENT: Conjunctivae and lids unremarkable. Cardiovascular: Regular rhythm.  Lungs: Normal work of breathing. Neurologic: No focal deficits.   Lab Results  Component Value Date   CREATININE 1.04 02/18/2022   BUN 10 02/18/2022   NA 141 02/18/2022   K 4.5 02/18/2022   CL 104 02/18/2022   CO2 23 02/18/2022   Lab Results  Component Value Date   ALT 36 02/18/2022   AST 21 02/18/2022   ALKPHOS 51 02/18/2022   BILITOT 0.2 02/18/2022   Lab Results  Component Value Date   HGBA1C 7.4 (H) 02/18/2022   HGBA1C 8.3 (H) 10/06/2021   HGBA1C 6.6 04/14/2021   HGBA1C 6.8 (H) 02/17/2021   HGBA1C 6.0 07/15/2015   No results found for: "INSULIN" Lab Results  Component Value Date   TSH 1.470 02/17/2021   Lab Results  Component Value Date   CHOL 99 (L) 02/18/2022   HDL 31 (L) 02/18/2022   LDLCALC 29 02/18/2022   TRIG  259 (H) 02/18/2022   CHOLHDL 3.2 02/18/2022   Lab Results  Component Value Date   VD25OH 33.6 10/06/2021   VD25OH 42.2 06/16/2021   Lab Results  Component Value Date   WBC 10.1 02/18/2022   HGB 16.8 02/18/2022   HCT 47.7 02/18/2022   MCV 88 02/18/2022   PLT 293 02/18/2022   No results found for: "IRON", "TIBC", "FERRITIN"  Attestation Statements:   Reviewed  by clinician on day of visit: allergies, medications, problem list, medical history, surgical history, family history, social history, and previous encounter notes.   Trude Mcburney, am acting as Energy manager for [employer], DO.  I have reviewed the above documentation for accuracy and completeness, and I agree with the above. Corinna Capra, DO

## 2022-04-14 ENCOUNTER — Encounter (INDEPENDENT_AMBULATORY_CARE_PROVIDER_SITE_OTHER): Payer: Self-pay | Admitting: Bariatrics

## 2022-04-14 LAB — NMR, LIPOPROFILE
Cholesterol, Total: 95 mg/dL — ABNORMAL LOW (ref 100–199)
HDL Particle Number: 28.2 umol/L — ABNORMAL LOW (ref >=30.5)
HDL-C: 32 mg/dL — ABNORMAL LOW (ref >39)
LDL Particle Number: 720 nmol/L (ref <1000)
LDL Size: 19.9 nm — ABNORMAL LOW (ref >20.5)
LDL-C (NIH Calc): 48 mg/dL (ref 0–99)
LP-IR Score: 63 — ABNORMAL HIGH (ref <=45)
Small LDL Particle Number: 504 nmol/L (ref <=527)
Triglycerides: 68 mg/dL (ref 0–149)

## 2022-04-20 ENCOUNTER — Encounter: Payer: Self-pay | Admitting: Internal Medicine

## 2022-04-20 ENCOUNTER — Other Ambulatory Visit (INDEPENDENT_AMBULATORY_CARE_PROVIDER_SITE_OTHER): Payer: Self-pay | Admitting: Bariatrics

## 2022-04-20 ENCOUNTER — Ambulatory Visit: Payer: Medicare HMO | Admitting: Internal Medicine

## 2022-04-20 VITALS — BP 138/78 | HR 82 | Ht 71.0 in | Wt 251.2 lb

## 2022-04-20 DIAGNOSIS — T466X5D Adverse effect of antihyperlipidemic and antiarteriosclerotic drugs, subsequent encounter: Secondary | ICD-10-CM

## 2022-04-20 DIAGNOSIS — T466X5A Adverse effect of antihyperlipidemic and antiarteriosclerotic drugs, initial encounter: Secondary | ICD-10-CM

## 2022-04-20 DIAGNOSIS — E785 Hyperlipidemia, unspecified: Secondary | ICD-10-CM | POA: Diagnosis not present

## 2022-04-20 DIAGNOSIS — M791 Myalgia, unspecified site: Secondary | ICD-10-CM | POA: Diagnosis not present

## 2022-04-20 DIAGNOSIS — E1169 Type 2 diabetes mellitus with other specified complication: Secondary | ICD-10-CM

## 2022-04-20 DIAGNOSIS — I2510 Atherosclerotic heart disease of native coronary artery without angina pectoris: Secondary | ICD-10-CM

## 2022-04-20 NOTE — Patient Instructions (Addendum)
Medication Instructions:  Your physician recommends that you continue on your current medications as directed. Please refer to the Current Medication list given to you today.   Labwork: In 1 year: NMR  Testing/Procedures: None  Follow-Up: Follow up with Dr. Debara Pickett in 1 year.   Any Other Special Instructions Will Be Listed Below (If Applicable).     If you need a refill on your cardiac medications before your next appointment, please call your pharmacy.

## 2022-04-20 NOTE — Progress Notes (Signed)
LIPID CLINIC CONSULT NOTE  Chief Complaint:  Follow-up dyslipidemia  Primary Care Physician: Chevis Pretty, FNP  Primary Cardiologist:  Rozann Lesches, MD  HPI:  Roger Guerra is a 66 y.o. male who is being seen today for the evaluation of dyslipidemia at the request of Hassell Done, Mary-Margaret, *. Roger Guerra returns today for follow-up of dyslipidemia.  He is continue to work on diet, weight loss and lifestyle modification.  He is being seen in [facility] weight loss clinic.  His cholesterol has come down further including with the addition of ezetimibe.  Total cholesterol now 148, HDL 34, triglycerides 81 and LDL 98.  I am trying to target his LDL to below 70.  LDL particle numbers were 1278 with a small LDL-P of 661, persistently elevated.  Recent testing of his testosterone show that free and total testosterone are within therapeutic range and his vitamin D is normal on treatment at 42.  12/22/2021  Roger Guerra returns today for follow-up.  When I last saw him I felt that his lipids were improved however not at target.  LDL particle numbers including small LDL particle numbers were still elevated.  He wanted to work on 6 months of dietary changes and activity.  Since then weight has gone up.  Cholesterol appears essentially unchanged with LDL at 97, LDL particle #1246 and small LDL P of 700.  HDL is lower at 29 from 34 previously.  He reports compliance with ezetimibe and denies any side effects.  Unfortunately he had severe muscle cramps on statins and cannot take them.  04/20/2022  Roger Guerra is seen today in follow-up.  We could not get insurance coverage for Nexletol.  He did need additional lipid lowering.  We were successful at prior authorization for Praluent 75 mg every 2 weeks.  He has since been taking this medication.  He reports no issues with it although has been having some joint pains.  Previously had severe muscle cramps with statins so this is a different  symptom.  Is not clear whether this is necessarily medication related or not.  It has significantly improved his lipids, LDL-P now of 720 (down from 1246), LDL-C of 48 (down from 97), and triglycerides of 68, down from 259.  Small LDL particle number was 504.  PMHx:  Past Medical History:  Diagnosis Date   Anxiety disorder    Arthritis    Atherosclerosis    Back pain    Constipation    Coronary artery calcification seen on CT scan    Depression    Diabetes (HCC)    Erectile dysfunction    GERD (gastroesophageal reflux disease)    Heartburn    History of blood clots    History of DVT (deep vein thrombosis)    History of DVT of lower extremity    Right leg 2002    History of pneumonia    Hyperlipidemia    Hypogonadism in male    Hypopituitarism ([place])    Joint pain    Nerve root and plexus disorder    Obesity    Obstructive sleep apnea    OSA on CPAP    Osteoarthritis    Sciatica    Sleep apnea    SOBOE (shortness of breath on exertion)    Spinal stenosis    Spondylosis    Swelling of both lower extremities    Type 2 diabetes mellitus ([place])    Vitamin D deficiency     Past Surgical History:  Procedure Laterality Date   BLADDER SURGERY  2015   removal of skin tag near urethra   COLONOSCOPY N/A 12/10/2016   Procedure: COLONOSCOPY;  Surgeon: Daneil Dolin, MD;  Location: [facility];  Service: Endoscopy;  Laterality: N/A;  8:30 AM   KNEE ARTHROSCOPY WITH MEDIAL MENISECTOMY Right 08/25/2017   Procedure: Right knee arthroscopy, partial medial menisectomy and debridement;  Surgeon: Susa Day, MD;  Location: [facility];  Service: Orthopedics;  Laterality: Right;  60 mins   POLYPECTOMY  12/10/2016   Procedure: POLYPECTOMY;  Surgeon: Daneil Dolin, MD;  Location: [facility];  Service: Endoscopy;;  hepatic flexure   PTOSIS REPAIR Bilateral 02/2022   TONSILLECTOMY      FAMHx:  Family History  Problem Relation Age of Onset   Diabetes Mother    Lung cancer Father     Heart disease Father    Stroke Father    Cancer Father    Diabetes Sister    Diabetes Other        Siblings    SOCHx:   reports that he has quit smoking. His smoking use included cigarettes. He has a 37.50 pack-year smoking history. He has never used smokeless tobacco. He reports that he does not drink alcohol and does not use drugs.  ALLERGIES:  Allergies  Allergen Reactions   Invokana [Canagliflozin] Other (See Comments)    Yeast infection   Statins Other (See Comments)    Severe muscle cramps    ROS: Pertinent items noted in HPI and remainder of comprehensive ROS otherwise negative.  HOME MEDS: Current Outpatient Medications on File Prior to Visit  Medication Sig Dispense Refill   5-Hydroxytryptophan (5-HTP PO) Take by mouth.     Alirocumab (PRALUENT) 75 MG/ML SOAJ Inject 1 Dose into the skin every 14 (fourteen) days. 6 mL 3   AMBULATORY NON FORMULARY MEDICATION 0.2 mLs by Intracavernosal route as needed. Medication Name: Trimix  PGE 86mg Pap '30mg'$  Phent '1mg'$  5 mL 5   AMBULATORY NON FORMULARY MEDICATION Medication Name: Trimix  PGE 30 mcg Pap 30 mg Phent 1 mg 5 mL 5   aspirin 81 MG tablet Take 81 mg by mouth daily.     carisoprodol (SOMA) 350 MG tablet TAKE (1) TABLET BY MOUTH THREE TIMES DAILYAS NEEDED. 30 tablet 0   cetirizine (ZYRTEC) 10 MG tablet Take 10 mg by mouth every evening.      co-enzyme Q-10 30 MG capsule Take 30 mg by mouth daily.     Dulaglutide (TRULICITY) 4.5 MCB/7.6EGSOPN Inject 4.5 mg as directed once a week. 2 mL 0   ezetimibe (ZETIA) 10 MG tablet Take 1 tablet (10 mg total) by mouth daily. 90 tablet 1   fexofenadine (ALLEGRA) 180 MG tablet Take 180 mg by mouth every morning.      glucose blood test strip Use OneTouch Ultra test strips to check blood sugar once daily. DX:E11.69 100 each 0   ibuprofen (ADVIL) 800 MG tablet Take 1 tablet (800 mg total) by mouth in the morning and at bedtime. 30 tablet 1   Multiple Vitamin (MULTIVITAMIN WITH MINERALS)  TABS tablet Take 1 tablet by mouth daily.     Multiple Vitamins-Minerals (ZINC PO) Take by mouth.     Omega-3 Fatty Acids (FISH OIL PO) Take 1 capsule by mouth daily.     omeprazole (PRILOSEC OTC) 20 MG tablet Take 1 tablet (20 mg total) by mouth daily as needed (acid reflux). 90 tablet 1   ondansetron (ZOFRAN)  4 MG tablet Take 1 tablet (4 mg total) by mouth every 8 (eight) hours as needed for nausea or vomiting. 20 tablet 1   polyethylene glycol-electrolytes (NULYTELY) 420 g solution As directed 4000 mL 0   PRESCRIPTION MEDICATION Inject 1 Dose as directed as needed (erectile dysfunction). Trimix Papaverine '30mg'$ /ml Phentolamine '1mg'$ /ml Prostaglandin 52mg/ml     silodosin (RAPAFLO) 8 MG CAPS capsule Take 1 capsule (8 mg total) by mouth in the morning and at bedtime. 180 capsule 3   sodium chloride (OCEAN) 0.65 % SOLN nasal spray Place 1 spray into both nostrils as needed for congestion.     SYRINGE-NEEDLE, DISP, 3 ML (LUER LOCK SAFETY SYRINGES) 22G X 1" 3 ML MISC For testosterone  injections 30 each 1   testosterone cypionate (DEPOTESTOSTERONE CYPIONATE) 200 MG/ML injection Inject 1 mL (200 mg total) into the muscle every 14 (fourteen) days. Give 1 ml every 10 days 10 mL 3   vitamin E 400 UNIT capsule Take 400 Units by mouth daily.     No current facility-administered medications on file prior to visit.    LABS/IMAGING: No results found for this or any previous visit (from the past 48 hour(s)). No results found.  LIPID PANEL:    Component Value Date/Time   CHOL 99 (L) 02/18/2022 1135   TRIG 259 (H) 02/18/2022 1135   HDL 31 (L) 02/18/2022 1135   CHOLHDL 3.2 02/18/2022 1135   LDLCALC 29 02/18/2022 1135    WEIGHTS: Wt Readings from Last 3 Encounters:  04/20/22 251 lb 3.2 oz (113.9 kg)  04/12/22 244 lb (110.7 kg)  03/17/22 251 lb (113.9 kg)    VITALS: BP 138/78   Pulse 82   Ht '5\' 11"'$  (1.803 m)   Wt 251 lb 3.2 oz (113.9 kg)   SpO2 94%   BMI 35.04 kg/m    EXAM: Deferred  EKG: Deferred  ASSESSMENT: Mixed dyslipidemia, metabolic pattern Low testosterone Vitamin D deficiency Hypopituitarism (selective, not on steroids) Coronary artery calcification/atherosclerosis Family history of heart disease and stroke  PLAN: 1.   Roger Guerra had significant improvement in his lipids on combination of ezetimibe and low-dose Praluent 75 mg every 2 weeks.  He is having some joint aches however previously had had severe muscle cramps on statins.  I am not certain that these are related to his medications but we discussed the possibility of a holiday on the ezetimibe.  I think the Praluent is less likely to be an issue.  He can trial this and reach out to me if he finds some improvement off the medicine for short 2-week period.  Otherwise, continue both medications we will plan follow-up annually with a repeat lipid NMR.  He will follow-up with his primary cardiologist and PCP as usual.  KPixie Casino MD, FArrowhead Regional Medical Center FSherwood ManorDirector of the Advanced Lipid Disorders &  Cardiovascular Risk Reduction Clinic Diplomate of the American Board of Clinical Lipidology Attending Cardiologist  Direct Dial: [phone] Fax: [phone] Website:  [email or web address]  KNadean CorwinHilty 04/20/2022, 1:24 PM

## 2022-04-21 MED ORDER — TRULICITY 4.5 MG/0.5ML ~~LOC~~ SOAJ: 4.5000 mg | SUBCUTANEOUS | 0 refills | Status: DC

## 2022-04-21 NOTE — Telephone Encounter (Signed)
LAST APPOINTMENT DATE: 04/12/22 NEXT APPOINTMENT DATE: 05/10/22   [facility] - [place], [place] [address] Phone: [phone] Fax: [phone]  Patient is requesting a refill of the following medications: Pending Prescriptions:                       Disp   Refills   Dulaglutide (TRULICITY) 4.5 SA/6.3KZ SOPN  2 mL   0       Sig: Inject 4.5 mg as directed once a week.   Date last filled: 03/15/22 Previously prescribed by Dr. Owens Shark  Lab Results      Component                Value               Date                      HGBA1C                   7.4 (H)             02/18/2022                HGBA1C                   8.3 (H)             10/06/2021                HGBA1C                   6.6                 04/14/2021           Lab Results      Component                Value               Date                      LDLCALC                  29                  02/18/2022                CREATININE               1.04                02/18/2022           Lab Results      Component                Value               Date                      VD25OH                   33.6                10/06/2021                VD25OH                   42.2  06/16/2021            BP Readings from Last 3 Encounters: 04/20/22 : 138/78 04/12/22 : 126/78 03/17/22 : 137/81

## 2022-04-29 ENCOUNTER — Ambulatory Visit (HOSPITAL_COMMUNITY)
Admission: RE | Admit: 2022-04-29 | Discharge: 2022-04-29 | Disposition: A | Discharge status: Home / Self Care | Payer: Medicare HMO | Attending: Internal Medicine | Admitting: Internal Medicine

## 2022-04-29 ENCOUNTER — Encounter (HOSPITAL_COMMUNITY): Payer: Self-pay | Admitting: Internal Medicine

## 2022-04-29 ENCOUNTER — Other Ambulatory Visit: Payer: Self-pay

## 2022-04-29 ENCOUNTER — Encounter (HOSPITAL_COMMUNITY)
Admission: RE | Disposition: A | Discharge status: Home / Self Care | Payer: Self-pay | Source: Home / Self Care | Attending: Internal Medicine

## 2022-04-29 ENCOUNTER — Ambulatory Visit (HOSPITAL_COMMUNITY): Payer: Medicare HMO | Admitting: Certified Registered Nurse Anesthetist

## 2022-04-29 ENCOUNTER — Ambulatory Visit (HOSPITAL_BASED_OUTPATIENT_CLINIC_OR_DEPARTMENT_OTHER): Payer: Medicare HMO | Admitting: Certified Registered Nurse Anesthetist

## 2022-04-29 DIAGNOSIS — Q438 Other specified congenital malformations of intestine: Secondary | ICD-10-CM | POA: Diagnosis not present

## 2022-04-29 DIAGNOSIS — F32A Depression, unspecified: Secondary | ICD-10-CM | POA: Insufficient documentation

## 2022-04-29 DIAGNOSIS — E119 Type 2 diabetes mellitus without complications: Secondary | ICD-10-CM | POA: Insufficient documentation

## 2022-04-29 DIAGNOSIS — Z7985 Long-term (current) use of injectable non-insulin antidiabetic drugs: Secondary | ICD-10-CM | POA: Diagnosis not present

## 2022-04-29 DIAGNOSIS — Z86010 Personal history of colonic polyps: Secondary | ICD-10-CM | POA: Diagnosis not present

## 2022-04-29 DIAGNOSIS — Z1211 Encounter for screening for malignant neoplasm of colon: Secondary | ICD-10-CM | POA: Diagnosis not present

## 2022-04-29 DIAGNOSIS — Z87891 Personal history of nicotine dependence: Secondary | ICD-10-CM | POA: Diagnosis not present

## 2022-04-29 DIAGNOSIS — I2510 Atherosclerotic heart disease of native coronary artery without angina pectoris: Secondary | ICD-10-CM | POA: Diagnosis not present

## 2022-04-29 DIAGNOSIS — F419 Anxiety disorder, unspecified: Secondary | ICD-10-CM | POA: Insufficient documentation

## 2022-04-29 DIAGNOSIS — Z09 Encounter for follow-up examination after completed treatment for conditions other than malignant neoplasm: Secondary | ICD-10-CM | POA: Diagnosis not present

## 2022-04-29 DIAGNOSIS — K219 Gastro-esophageal reflux disease without esophagitis: Secondary | ICD-10-CM | POA: Diagnosis not present

## 2022-04-29 DIAGNOSIS — G4733 Obstructive sleep apnea (adult) (pediatric): Secondary | ICD-10-CM | POA: Diagnosis not present

## 2022-04-29 DIAGNOSIS — R69 Illness, unspecified: Secondary | ICD-10-CM | POA: Diagnosis not present

## 2022-04-29 HISTORY — PX: COLONOSCOPY WITH PROPOFOL: SHX5780

## 2022-04-29 LAB — GLUCOSE, CAPILLARY: Glucose-Capillary: 110 mg/dL — ABNORMAL HIGH (ref 70–99)

## 2022-04-29 SURGERY — COLONOSCOPY WITH PROPOFOL
Anesthesia: General

## 2022-04-29 MED ORDER — LACTATED RINGERS IV SOLN: INTRAVENOUS | Status: DC

## 2022-04-29 MED ORDER — PROPOFOL 500 MG/50ML IV EMUL
INTRAVENOUS | Status: AC
  Filled 2022-04-29: qty 50

## 2022-04-29 MED ORDER — PHENYLEPHRINE 80 MCG/ML (10ML) SYRINGE FOR IV PUSH (FOR BLOOD PRESSURE SUPPORT)
PREFILLED_SYRINGE | INTRAVENOUS | Status: AC
  Filled 2022-04-29: qty 10

## 2022-04-29 MED ORDER — PROPOFOL 500 MG/50ML IV EMUL
INTRAVENOUS | Status: DC | PRN
  Administered 2022-04-29: 150 ug/kg/min via INTRAVENOUS

## 2022-04-29 MED ORDER — PHENYLEPHRINE HCL (PRESSORS) 10 MG/ML IV SOLN
INTRAVENOUS | Status: DC | PRN
  Administered 2022-04-29: 160 ug via INTRAVENOUS

## 2022-04-29 MED ORDER — LACTATED RINGERS IV SOLN: INTRAVENOUS | Status: DC | PRN

## 2022-04-29 MED ORDER — PROPOFOL 10 MG/ML IV BOLUS
INTRAVENOUS | Status: DC | PRN
  Administered 2022-04-29: 60 mg via INTRAVENOUS

## 2022-04-29 NOTE — Op Note (Signed)
[facility] Patient Name: Roger Guerra Procedure Date: 04/29/2022 7:59 AM MRN: [id number] Date of Birth: 1956-02-22 Attending MD: Norvel Richards , MD CSN: [id number] Age: 66 Admit Type: Outpatient Procedure:                Colonoscopy Indications:              High risk colon cancer surveillance: Personal                            history of colonic polyps Providers:                Norvel Richards, MD, Lambert Mody, Ladoris Gene, Technician, Randa Spike, Technician Referring MD:              Medicines:                Propofol per Anesthesia Complications:            No immediate complications. Estimated Blood Loss:     Estimated blood loss: none. Procedure:                Pre-Anesthesia Assessment:                           - Prior to the procedure, a History and Physical                            was performed, and patient medications and                            allergies were reviewed. The patient's tolerance of                            previous anesthesia was also reviewed. The risks                            and benefits of the procedure and the sedation                            options and risks were discussed with the patient.                            All questions were answered, and informed consent                            was obtained. Prior Anticoagulants: The patient has                            taken no previous anticoagulant or antiplatelet                            agents. ASA Grade Assessment: II - A patient with  mild systemic disease. After reviewing the risks                            and benefits, the patient was deemed in                            satisfactory condition to undergo the procedure.                           After obtaining informed consent, the colonoscope                            was passed under direct vision. Throughout the                             procedure, the patient's blood pressure, pulse, and                            oxygen saturations were monitored continuously. The                            [id number]) scope was introduced through                            the anus and advanced to the the cecum, identified                            by appendiceal orifice and ileocecal valve. The                            colonoscopy was performed without difficulty. The                            patient tolerated the procedure well. The quality                            of the bowel preparation was adequate. Scope In: 8:14:13 AM Scope Out: 8:25:26 AM Scope Withdrawal Time: 0 hours 7 minutes 11 seconds  Total Procedure Duration: 0 hours 11 minutes 13 seconds  Findings:      The perianal and digital rectal examinations were normal. Redundant and       elongated colon. Changing of the patient's position and external       abdominal pressure required to reach the cecum.      The colon (entire examined portion) appeared normal.      The retroflexed view of the distal rectum and anal verge was normal and       showed no anal or rectal abnormalities. Impression:               - The entire examined colon is normal (redundant                            and elongated).                           -  The distal rectum and anal verge are normal on                            retroflexion view.                           - No specimens collected. Moderate Sedation:      Moderate (conscious) sedation was personally administered by an       anesthesia professional. The following parameters were monitored: oxygen       saturation, heart rate, blood pressure, and response to care. Recommendation:           - Patient has a contact number available for                            emergencies. The signs and symptoms of potential                            delayed complications were discussed with the                            patient. Return to  normal activities tomorrow.                            Written discharge instructions were provided to the                            patient.                           - Resume previous diet.                           - Continue present medications.                           - Repeat colonoscopy in 7 years for surveillance.                           - Return to GI office (date not yet determined). Procedure Code(s):        --- Professional ---                           [phone], Colonoscopy, flexible; diagnostic, including                            collection of specimen(s) by brushing or washing,                            when performed (separate procedure) Diagnosis Code(s):        --- Professional ---                           Z86.010, Personal history of colonic polyps CPT copyright 2019 American Medical Association. All rights reserved. The codes documented in this report are preliminary and upon coder review may  be revised  to meet current compliance requirements. Cristopher Estimable. Henriette Hesser, MD Norvel Richards, MD 04/29/2022 9:47:46 AM This report has been signed electronically. Number of Addenda: 0

## 2022-04-29 NOTE — Discharge Instructions (Addendum)
  Colonoscopy Discharge Instructions  Read the instructions outlined below and refer to this sheet in the next few weeks. These discharge instructions provide you with general information on caring for yourself after you leave the hospital. Your doctor may also give you specific instructions. While your treatment has been planned according to the most current medical practices available, unavoidable complications occasionally occur. If you have any problems or questions after discharge, call Dr. Gala Romney at [phone]. ACTIVITY You may resume your regular activity, but move at a slower pace for the next 24 hours.  Take frequent rest periods for the next 24 hours.  Walking will help get rid of the air and reduce the bloated feeling in your belly (abdomen).  No driving for 24 hours (because of the medicine (anesthesia) used during the test).   Do not sign any important legal documents or operate any machinery for 24 hours (because of the anesthesia used during the test).  NUTRITION Drink plenty of fluids.  You may resume your normal diet as instructed by your doctor.  Begin with a light meal and progress to your normal diet. Heavy or fried foods are harder to digest and may make you feel sick to your stomach (nauseated).  Avoid alcoholic beverages for 24 hours or as instructed.  MEDICATIONS You may resume your normal medications unless your doctor tells you otherwise.  WHAT YOU CAN EXPECT TODAY Some feelings of bloating in the abdomen.  Passage of more gas than usual.  Spotting of blood in your stool or on the toilet paper.  IF YOU HAD POLYPS REMOVED DURING THE COLONOSCOPY: No aspirin products for 7 days or as instructed.  No alcohol for 7 days or as instructed.  Eat a soft diet for the next 24 hours.  FINDING OUT THE RESULTS OF YOUR TEST Not all test results are available during your visit. If your test results are not back during the visit, make an appointment with your caregiver to find out the  results. Do not assume everything is normal if you have not heard from your caregiver or the medical facility. It is important for you to follow up on all of your test results.  SEEK IMMEDIATE MEDICAL ATTENTION IF: You have more than a spotting of blood in your stool.  Your belly is swollen (abdominal distention).  You are nauseated or vomiting.  You have a temperature over 101.  You have abdominal pain or discomfort that is severe or gets worse throughout the day.     No polyps found today  It is recommended you return for repeat colonoscopy in 7 years.  At patient request, I called lady at [phone] findings and recommendations

## 2022-04-29 NOTE — Transfer of Care (Signed)
Immediate Anesthesia Transfer of Care Note  Patient: Rickard Kennerly  Procedure(s) Performed: COLONOSCOPY WITH PROPOFOL  Patient Location: PACU  Anesthesia Type:General  Level of Consciousness: awake, alert  and oriented  Airway & Oxygen Therapy: Patient Spontanous Breathing and Patient connected to nasal cannula oxygen  Post-op Assessment: Report given to RN, Post -op Vital signs reviewed and stable, Patient moving all extremities X 4 and Patient able to stick tongue midline  Post vital signs: Reviewed  Last Vitals:  Vitals Value Taken Time  BP 105/85   Temp 98.1   Pulse 93   Resp 17   SpO2 93     Last Pain:  Vitals:   04/29/22 0809  TempSrc:   PainSc: 0-No pain      Patients Stated Pain Goal: 5 (83/81/84 0375)  Complications: No notable events documented.

## 2022-04-29 NOTE — Anesthesia Preprocedure Evaluation (Signed)
Anesthesia Evaluation  Patient identified by MRN, date of birth, ID band Patient awake    Reviewed: Allergy & Precautions, H&P , NPO status , Patient's Chart, lab work & pertinent test results, reviewed documented beta blocker date and time   Airway Mallampati: II  TM Distance: >3 FB Neck ROM: full    Dental no notable dental hx.    Pulmonary sleep apnea , former smoker,    Pulmonary exam normal breath sounds clear to auscultation       Cardiovascular Exercise Tolerance: Good + CAD   Rhythm:regular Rate:Normal     Neuro/Psych PSYCHIATRIC DISORDERS Anxiety Depression  Neuromuscular disease    GI/Hepatic Neg liver ROS, GERD  Medicated,  Endo/Other  negative endocrine ROSdiabetes, Type 2  Renal/GU negative Renal ROS  negative genitourinary   Musculoskeletal   Abdominal   Peds  Hematology negative hematology ROS (+)   Anesthesia Other Findings   Reproductive/Obstetrics negative OB ROS                             Anesthesia Physical Anesthesia Plan  ASA: 3  Anesthesia Plan: General   Post-op Pain Management:    Induction:   PONV Risk Score and Plan: Propofol infusion  Airway Management Planned:   Additional Equipment:   Intra-op Plan:   Post-operative Plan:   Informed Consent: I have reviewed the patients History and Physical, chart, labs and discussed the procedure including the risks, benefits and alternatives for the proposed anesthesia with the patient or authorized representative who has indicated his/her understanding and acceptance.     Dental Advisory Given  Plan Discussed with: CRNA  Anesthesia Plan Comments:         Anesthesia Quick Evaluation

## 2022-04-29 NOTE — H&P (Signed)
$'@LOGO'm$ @   Primary Care Physician:  Chevis Pretty, FNP Primary Gastroenterologist:  Dr. Gala Romney  Pre-Procedure History & Physical: HPI:  Roger Guerra is a 66 y.o. male here for surveillance colonoscopy.  History of colonic adenoma removed 2018.  No GI symptoms aside from rare episodes of constipation managed with MOM.  Past Medical History:  Diagnosis Date   Anxiety disorder    Arthritis    Atherosclerosis    Back pain    Constipation    Coronary artery calcification seen on CT scan    Depression    Diabetes (HCC)    Erectile dysfunction    GERD (gastroesophageal reflux disease)    Heartburn    History of blood clots    History of DVT (deep vein thrombosis)    History of DVT of lower extremity    Right leg 2002    History of pneumonia    Hyperlipidemia    Hypogonadism in male    Hypopituitarism ([place])    Joint pain    Nerve root and plexus disorder    Obesity    Obstructive sleep apnea    OSA on CPAP    Osteoarthritis    Sciatica    Sleep apnea    SOBOE (shortness of breath on exertion)    Spinal stenosis    Spondylosis    Swelling of both lower extremities    Type 2 diabetes mellitus ([place])    Vitamin D deficiency     Past Surgical History:  Procedure Laterality Date   BLADDER SURGERY  2015   removal of skin tag near urethra   COLONOSCOPY N/A 12/10/2016   Procedure: COLONOSCOPY;  Surgeon: Daneil Dolin, MD;  [facility];  Service: Endoscopy;  Laterality: N/A;  8:30 AM   KNEE ARTHROSCOPY WITH MEDIAL MENISECTOMY Right 08/25/2017   Procedure: Right knee arthroscopy, partial medial menisectomy and debridement;  Surgeon: Susa Day, MD;  Location: [facility];  Service: Orthopedics;  Laterality: Right;  60 mins   POLYPECTOMY  12/10/2016   Procedure: POLYPECTOMY;  Surgeon: Daneil Dolin, MD;  [facility];  Service: Endoscopy;;  hepatic flexure   PTOSIS REPAIR Bilateral 02/2022   TONSILLECTOMY      Prior to Admission medications    Medication Sig Start Date End Date Taking? Authorizing Provider  5-Hydroxytryptophan (5-HTP PO) Take 150 mg by mouth daily.   Yes [provider]  Alirocumab (PRALUENT) 75 MG/ML SOAJ Inject 1 Dose into the skin every 14 (fourteen) days. 12/28/21  Yes Hilty, Nadean Corwin, MD  AMBULATORY NON FORMULARY MEDICATION Medication Name: Trimix  PGE 30 mcg Pap 30 mg Phent 1 mg Patient taking differently: daily as needed (ED). Medication Name: Trimix  PGE 30 mcg Pap 30 mg Phent 1 mg 11/09/21  Yes McKenzie, Candee Furbish, MD  aspirin 81 MG tablet Take 81 mg by mouth daily.   Yes [provider]  carisoprodol (SOMA) 350 MG tablet TAKE (1) TABLET BY MOUTH THREE TIMES DAILYAS NEEDED. 01/12/22  Yes Hassell Done, Mary-Margaret, FNP  cetirizine (ZYRTEC) 10 MG tablet Take 10 mg by mouth every evening.    Yes [provider]  Cholecalciferol (VITAMIN D3 PO) Take 1 tablet by mouth daily.   Yes [provider]  co-enzyme Q-10 30 MG capsule Take 30 mg by mouth daily.   Yes [provider]  Dulaglutide (TRULICITY) 4.5 NT/6.1WE SOPN Inject 4.5 mg as directed once a week. 04/21/22  Yes Jearld Lesch A, DO  ezetimibe (ZETIA) 10  MG tablet Take 1 tablet (10 mg total) by mouth daily. 11/05/21 04/26/22 Yes Martin, Mary-Margaret, FNP  fexofenadine (ALLEGRA) 180 MG tablet Take 180 mg by mouth every morning.    Yes [provider]  magnesium oxide (MAG-OX) 400 (240 Mg) MG tablet Take 400 mg by mouth daily.   Yes [provider]  Multiple Vitamin (MULTIVITAMIN WITH MINERALS) TABS tablet Take 1 tablet by mouth daily.   Yes [provider]  Omega-3 Fatty Acids (FISH OIL PO) Take 1,000 Units by mouth daily.   Yes [provider]  omeprazole (PRILOSEC OTC) 20 MG tablet Take 1 tablet (20 mg total) by mouth daily as needed (acid reflux). 11/05/21  Yes Hassell Done, Mary-Margaret, FNP  ondansetron (ZOFRAN) 4 MG tablet Take 1 tablet (4 mg total) by mouth every 8 (eight) hours as  needed for nausea or vomiting. 11/17/21  Yes Jearld Lesch A, DO  polyethylene glycol-electrolytes (NULYTELY) 420 g solution As directed 03/26/22  Yes Nura Cahoon, Cristopher Estimable, MD  Scar Treatment Products [facility]) GEL Apply 1 Application topically 2 (two) times daily.   Yes [provider]  silodosin (RAPAFLO) 8 MG CAPS capsule Take 1 capsule (8 mg total) by mouth in the morning and at bedtime. 03/17/22  Yes McKenzie, Candee Furbish, MD  sodium chloride (OCEAN) 0.65 % SOLN nasal spray Place 1 spray into both nostrils 2 (two) times daily.   Yes [provider]  testosterone cypionate (DEPOTESTOSTERONE CYPIONATE) 200 MG/ML injection Inject 1 mL (200 mg total) into the muscle every 14 (fourteen) days. Give 1 ml every 10 days Patient taking differently: Inject 200 mg into the muscle See admin instructions. Give 1 ml every 10 days 02/18/22  Yes Hassell Done, Mary-Margaret, FNP  vitamin E 400 UNIT capsule Take 400 Units by mouth daily.   Yes [provider]  Jay Schlichter Oil (REFRESH P.M. OP) Place 1 drop into both eyes at bedtime.   Yes [provider]  zinc gluconate 50 MG tablet Take 50 mg by mouth daily.   Yes [provider]  AMBULATORY NON FORMULARY MEDICATION 0.2 mLs by Intracavernosal route as needed. Medication Name: Trimix  PGE 57mg Pap '30mg'$  Phent '1mg'$  Patient not taking: Reported on 04/26/2022 11/09/21   MCleon Gustin MD  glucose blood test strip Use OneTouch Ultra test strips to check blood sugar once daily. DX:E11.69 01/07/22   BJearld LeschA, DO  ibuprofen (ADVIL) 800 MG tablet Take 1 tablet (800 mg total) by mouth in the morning and at bedtime. Patient taking differently: Take 800 mg by mouth every 8 (eight) hours as needed for moderate pain or mild pain. 11/05/21   MHassell Done Mary-Margaret, FNP  SYRINGE-NEEDLE, DISP, 3 ML ([place] 22G X 1" 3 ML MISC For testosterone  injections 02/18/22   MChevis Pretty FNP    Allergies as of  03/26/2022 - Review Complete 03/17/2022  Allergen Reaction Noted   Invokana [canagliflozin] Other (See Comments) 07/28/2015   Statins Other (See Comments) 09/08/2021    Family History  Problem Relation Age of Onset   Diabetes Mother    Lung cancer Father    Heart disease Father    Stroke Father    Cancer Father    Diabetes Sister    Diabetes Other        Siblings    Social History   Socioeconomic History   Marital status: Married    Spouse name: LOland Arquette  Number of children: 2   Years of education:  Not on file   Highest education level: Master's degree (e.g., MA, MS, MEng, MEd, MSW, MBA)  Occupational History   Occupation: Retired Pharmacist, 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  Tobacco Use   Smoking status: Former    Packs/day: 2.50    Years: 15.00    Total pack years: 37.50    Types: Cigarettes   Smokeless tobacco: Never  Vaping Use   Vaping Use: Never used  Substance and Sexual Activity   Alcohol use: No    Alcohol/week: 0.0 standard drinks of alcohol   Drug use: No   Sexual activity: Not on file  Other Topics Concern   Not on file  Social History Narrative   Not on file   Social Determinants of Health   Financial Resource Strain: Not on file  Food Insecurity: Not on file  Transportation Needs: Not on file  Physical Activity: Not on file  Stress: Not on file  Social Connections: Not on file  Intimate Partner Violence: Not on file    Review of Systems: See HPI, otherwise negative ROS  Physical Exam: BP 130/83   Temp 97.8 F (36.6 C) (Oral)   Ht '5\' 11"'$  (1.803 m)   Wt 111.1 kg   SpO2 97%   BMI 34.17 kg/m  General:   Alert,  Well-developed, well-nourished, pleasant and cooperative in NAD Neck:  Supple; no masses or thyromegaly. No significant cervical adenopathy. Lungs:  Clear throughout to auscultation.   No wheezes, crackles, or rhonchi. No acute distress. Heart:  Regular rate and rhythm; no murmurs, clicks, rubs,  or gallops. Abdomen:  Non-distended, normal bowel sounds.  Soft and nontender without appreciable mass or hepatosplenomegaly.  Pulses:  Normal pulses noted. Extremities:  Without clubbing or edema.  Impression/Plan: 66 year old gentleman history colonic adenoma-here for surveillance colonoscopy.  The risks, benefits, limitations, alternatives and imponderables have been reviewed with the patient. Questions have been answered. All parties are agreeable.       Notice: This dictation was prepared with Dragon dictation along with smaller phrase technology. Any transcriptional errors that result from this process are unintentional and may not be corrected upon review.

## 2022-04-30 NOTE — Anesthesia Postprocedure Evaluation (Signed)
Anesthesia Post Note  Patient: Roger Guerra  Procedure(s) Performed: COLONOSCOPY WITH PROPOFOL  Patient location during evaluation: Phase II Anesthesia Type: General Level of consciousness: awake Pain management: pain level controlled Vital Signs Assessment: post-procedure vital signs reviewed and stable Respiratory status: spontaneous breathing and respiratory function stable Cardiovascular status: blood pressure returned to baseline and stable Postop Assessment: no headache and no apparent nausea or vomiting Anesthetic complications: no Comments: Late entry   No notable events documented.   Last Vitals:  Vitals:   04/29/22 0832 04/29/22 0836  BP: 105/85   Pulse: 93   Resp: 17 13  Temp: 36.7 C   SpO2: (!) 89% 93%    Last Pain:  Vitals:   04/29/22 0836  TempSrc:   PainSc: 0-No pain                 Louann Sjogren

## 2022-05-04 ENCOUNTER — Ambulatory Visit: Payer: Medicare HMO | Admitting: General Surgery

## 2022-05-05 ENCOUNTER — Encounter (HOSPITAL_COMMUNITY): Payer: Self-pay | Admitting: Internal Medicine

## 2022-05-05 ENCOUNTER — Encounter (INDEPENDENT_AMBULATORY_CARE_PROVIDER_SITE_OTHER): Payer: Self-pay

## 2022-05-05 ENCOUNTER — Ambulatory Visit: Payer: Medicare HMO | Admitting: Nurse Practitioner

## 2022-05-06 ENCOUNTER — Ambulatory Visit: Payer: Medicare HMO | Admitting: Nurse Practitioner

## 2022-05-10 ENCOUNTER — Ambulatory Visit (INDEPENDENT_AMBULATORY_CARE_PROVIDER_SITE_OTHER): Payer: Medicare HMO | Admitting: Bariatrics

## 2022-05-10 ENCOUNTER — Encounter (INDEPENDENT_AMBULATORY_CARE_PROVIDER_SITE_OTHER): Payer: Self-pay | Admitting: Bariatrics

## 2022-05-10 VITALS — BP 110/70 | HR 90 | Temp 97.6°F | Ht 71.0 in | Wt 245.0 lb

## 2022-05-10 DIAGNOSIS — E669 Obesity, unspecified: Secondary | ICD-10-CM | POA: Diagnosis not present

## 2022-05-10 DIAGNOSIS — E1165 Type 2 diabetes mellitus with hyperglycemia: Secondary | ICD-10-CM | POA: Insufficient documentation

## 2022-05-10 DIAGNOSIS — K219 Gastro-esophageal reflux disease without esophagitis: Secondary | ICD-10-CM

## 2022-05-10 DIAGNOSIS — Z6834 Body mass index (BMI) 34.0-34.9, adult: Secondary | ICD-10-CM | POA: Diagnosis not present

## 2022-05-10 DIAGNOSIS — E1169 Type 2 diabetes mellitus with other specified complication: Secondary | ICD-10-CM

## 2022-05-10 MED ORDER — INSULIN PEN NEEDLE 32G X 4 MM MISC: 0 refills | Status: DC

## 2022-05-10 MED ORDER — VICTOZA 18 MG/3ML ~~LOC~~ SOPN: PEN_INJECTOR | SUBCUTANEOUS | 0 refills | Status: DC

## 2022-05-11 ENCOUNTER — Encounter: Payer: Self-pay | Admitting: Nurse Practitioner

## 2022-05-11 ENCOUNTER — Ambulatory Visit (INDEPENDENT_AMBULATORY_CARE_PROVIDER_SITE_OTHER): Payer: Medicare HMO | Admitting: Nurse Practitioner

## 2022-05-11 VITALS — BP 107/72 | HR 88 | Temp 98.3°F | Resp 20 | Ht 71.0 in | Wt 251.0 lb

## 2022-05-11 DIAGNOSIS — E230 Hypopituitarism: Secondary | ICD-10-CM

## 2022-05-11 DIAGNOSIS — E559 Vitamin D deficiency, unspecified: Secondary | ICD-10-CM | POA: Diagnosis not present

## 2022-05-11 DIAGNOSIS — E785 Hyperlipidemia, unspecified: Secondary | ICD-10-CM

## 2022-05-11 DIAGNOSIS — G4733 Obstructive sleep apnea (adult) (pediatric): Secondary | ICD-10-CM | POA: Diagnosis not present

## 2022-05-11 DIAGNOSIS — K219 Gastro-esophageal reflux disease without esophagitis: Secondary | ICD-10-CM

## 2022-05-11 DIAGNOSIS — E669 Obesity, unspecified: Secondary | ICD-10-CM

## 2022-05-11 DIAGNOSIS — Z125 Encounter for screening for malignant neoplasm of prostate: Secondary | ICD-10-CM

## 2022-05-11 DIAGNOSIS — M5030 Other cervical disc degeneration, unspecified cervical region: Secondary | ICD-10-CM | POA: Diagnosis not present

## 2022-05-11 DIAGNOSIS — E1169 Type 2 diabetes mellitus with other specified complication: Secondary | ICD-10-CM

## 2022-05-11 DIAGNOSIS — F3289 Other specified depressive episodes: Secondary | ICD-10-CM

## 2022-05-11 DIAGNOSIS — Z9989 Dependence on other enabling machines and devices: Secondary | ICD-10-CM | POA: Diagnosis not present

## 2022-05-11 DIAGNOSIS — Z6836 Body mass index (BMI) 36.0-36.9, adult: Secondary | ICD-10-CM

## 2022-05-11 DIAGNOSIS — R69 Illness, unspecified: Secondary | ICD-10-CM | POA: Diagnosis not present

## 2022-05-11 LAB — BAYER DCA HB A1C WAIVED: HB A1C (BAYER DCA - WAIVED): 7.2 % — ABNORMAL HIGH (ref 4.8–5.6)

## 2022-05-11 MED ORDER — IBUPROFEN 800 MG PO TABS: 800.0000 mg | ORAL_TABLET | Freq: Two times a day (BID) | ORAL | 1 refills | Status: DC

## 2022-05-11 MED ORDER — VICTOZA 18 MG/3ML ~~LOC~~ SOPN: PEN_INJECTOR | SUBCUTANEOUS | 0 refills | Status: DC

## 2022-05-11 MED ORDER — OMEPRAZOLE MAGNESIUM 20 MG PO TBEC: 20.0000 mg | DELAYED_RELEASE_TABLET | Freq: Every day | ORAL | 1 refills | Status: DC | PRN

## 2022-05-11 MED ORDER — CARISOPRODOL 350 MG PO TABS: ORAL_TABLET | ORAL | 0 refills | Status: DC

## 2022-05-11 NOTE — Patient Instructions (Signed)

## 2022-05-11 NOTE — Progress Notes (Signed)
Subjective:    Patient ID: Roger Guerra, male    DOB: Jun 27, 1956, 66 y.o.   MRN: [id number]   Chief Complaint: medical management of chronic issues     HPI:  Roger Guerra is a 66 y.o. who identifies as a male who was assigned male at birth.   Social history: Lives with: wife Work history: retired   Scientist, forensic in today for follow up of the following chronic medical issues:  1. Diabetes mellitus type 2 in obese (HCC) Fasting blood sugars are running around 100-150. No low blood sugars. Lab Results  Component Value Date   HGBA1C 7.4 (H) 02/18/2022     2. Hyperlipidemia associated with type 2 diabetes mellitus ([place]) Does not really watch diet very closely and does no dedicated exercise. Lab Results  Component Value Date   CHOL 99 (L) 02/18/2022   HDL 31 (L) 02/18/2022   LDLCALC 29 02/18/2022   TRIG 259 (H) 02/18/2022   CHOLHDL 3.2 02/18/2022   The ASCVD Risk score (Arnett DK, et al., 2019) failed to calculate for the following reasons:   The valid total cholesterol range is 130 to 320 mg/dL   3. Screening for prostate cancer Denies any voiding issues   4. Hypopituitarism [facility]) Lab Results  Component Value Date   CALCIUM 9.7 02/18/2022     5. Gastroesophageal reflux disease, unspecified whether esophagitis present Is on omeprazole daily and is doing well.  6. OSA on CPAP Wears cpap nightly- feels rested in mornings  7. Other depression Is on no antidepressant  8. Vitamin D deficiency Is on daily vitamin d supplement  9. DDD (degenerative disc disease), cervical Pain assessment: Cause of pain- DDD Pain location- low back pain Pain on scale of 1-10- 8/10 Frequency- daily What increases pain-to much activity What makes pain Better-rest helps Effects on ADL - none Any change in general medical condition-none  Current opioids rx- soma # meds rx- does not take everyday Effectiveness of current meds-none Adverse reactions from pain  meds-none Morphine equivalent- ?  Pill count performed-No Last drug screen - none ( high risk q91m moderate risk q640mlow risk yearly ) Urine drug screen today- no- patient very seldom takes soma [place]- yes  If yes were their any concerning findings? - no   Overdose risk: 1   Pain contract signed on:   10. Class 2 severe obesity with serious comorbidity and body mass index (BMI) of 36.0 to 36.9 in adult, unspecified obesity type [place] recent [place]   New complaints: None today  Allergies  Allergen Reactions   Invokana [Canagliflozin] Other (See Comments)    Yeast infection   Statins Other (See Comments)    Severe muscle cramps   Outpatient Encounter Medications as of 05/11/2022  Medication Sig   5-Hydroxytryptophan (5-HTP PO) Take 150 mg by mouth daily.   Alirocumab (PRALUENT) 75 MG/ML SOAJ Inject 1 Dose into the skin every 14 (fourteen) days.   AMBULATORY NON FORMULARY MEDICATION 0.2 mLs by Intracavernosal route as needed. Medication Name: Trimix  PGE 3053mPap 63m40ment 1mg 5mtient not taking: Reported on 05/10/2022)   AMBULATORY NON FORMULARY MEDICATION Medication Name: Trimix  PGE 30 mcg Pap 30 mg Phent 1 mg (Patient taking differently: daily as needed (ED). Medication Name: Trimix  PGE 30 mcg Pap 30 mg Phent 1 mg)   aspirin 81 MG tablet Take 81 mg by mouth daily.   carisoprodol (SOMA) 350 MG tablet TAKE (1) TABLET BY MOUTH THREE  TIMES DAILYAS NEEDED.   cetirizine (ZYRTEC) 10 MG tablet Take 10 mg by mouth every evening.    Cholecalciferol (VITAMIN D3 PO) Take 1 tablet by mouth daily.   co-enzyme Q-10 30 MG capsule Take 30 mg by mouth daily.   ezetimibe (ZETIA) 10 MG tablet Take 1 tablet (10 mg total) by mouth daily.   fexofenadine (ALLEGRA) 180 MG tablet Take 180 mg by mouth every morning.    glucose blood test strip Use OneTouch Ultra test strips to check blood sugar once daily. DX:E11.69   ibuprofen (ADVIL) 800 MG tablet Take  1 tablet (800 mg total) by mouth in the morning and at bedtime. (Patient taking differently: Take 800 mg by mouth every 8 (eight) hours as needed for moderate pain or mild pain.)   Insulin Pen Needle 32G X 4 MM MISC Use as directed daily   liraglutide (VICTOZA) 18 MG/3ML SOPN Start 0.69m SQ once a day for 7 days, then increase to 1.222monce a day   magnesium oxide (MAG-OX) 400 (240 Mg) MG tablet Take 400 mg by mouth daily.   Multiple Vitamin (MULTIVITAMIN WITH MINERALS) TABS tablet Take 1 tablet by mouth daily.   Omega-3 Fatty Acids (FISH OIL PO) Take 1,000 Units by mouth daily.   omeprazole (PRILOSEC OTC) 20 MG tablet Take 1 tablet (20 mg total) by mouth daily as needed (acid reflux).   ondansetron (ZOFRAN) 4 MG tablet Take 1 tablet (4 mg total) by mouth every 8 (eight) hours as needed for nausea or vomiting.   polyethylene glycol-electrolytes (NULYTELY) 420 g solution As directed   Scar Treatment Products (MEDERMA) GEL Apply 1 Application topically 2 (two) times daily.   silodosin (RAPAFLO) 8 MG CAPS capsule Take 1 capsule (8 mg total) by mouth in the morning and at bedtime.   sodium chloride (OCEAN) 0.65 % SOLN nasal spray Place 1 spray into both nostrils 2 (two) times daily.   SYRINGE-NEEDLE, DISP, 3 ML (LUER LOCK SAFETY SYRINGES) 22G X 1" 3 ML MISC For testosterone  injections   testosterone cypionate (DEPOTESTOSTERONE CYPIONATE) 200 MG/ML injection Inject 1 mL (200 mg total) into the muscle every 14 (fourteen) days. Give 1 ml every 10 days (Patient taking differently: Inject 200 mg into the muscle See admin instructions. Give 1 ml every 10 days)   vitamin E 400 UNIT capsule Take 400 Units by mouth daily.   White Petrolatum-Mineral Oil (REFRESH P.M. OP) Place 1 drop into both eyes at bedtime.   zinc gluconate 50 MG tablet Take 50 mg by mouth daily.   No facility-administered encounter medications on file as of 05/11/2022.    Past Surgical History:  Procedure Laterality Date   BLADDER  SURGERY  2015   removal of skin tag near urethra   COLONOSCOPY N/A 12/10/2016   Procedure: COLONOSCOPY;  Surgeon: RoDaneil DolinMD;  [facility];  Service: Endoscopy;  Laterality: N/A;  8:30 AM   COLONOSCOPY WITH PROPOFOL N/A 04/29/2022   Procedure: COLONOSCOPY WITH PROPOFOL;  Surgeon: RoDaneil DolinMD;  [facility];  Service: Endoscopy;  Laterality: N/A;  8:00am   KNEE ARTHROSCOPY WITH MEDIAL MENISECTOMY Right 08/25/2017   Procedure: Right knee arthroscopy, partial medial menisectomy and debridement;  Surgeon: BeSusa DayMD;  [facility];  Service: Orthopedics;  Laterality: Right;  60 mins   POLYPECTOMY  12/10/2016   Procedure: POLYPECTOMY;  Surgeon: RoDaneil DolinMD;  [facility];  Service: Endoscopy;;  hepatic flexure  PTOSIS REPAIR Bilateral 02/2022   TONSILLECTOMY      Family History  Problem Relation Age of Onset   Diabetes Mother    Lung cancer Father    Heart disease Father    Stroke Father    Cancer Father    Diabetes Sister    Diabetes Other        Siblings      Controlled substance contract: n/a     Review of Systems  Constitutional:  Negative for diaphoresis.  Eyes:  Negative for pain.  Respiratory:  Negative for shortness of breath.   Cardiovascular:  Negative for chest pain, palpitations and leg swelling.  Gastrointestinal:  Negative for abdominal pain.  Endocrine: Negative for polydipsia.  Skin:  Negative for rash.  Neurological:  Negative for dizziness, weakness and headaches.  Hematological:  Does not bruise/bleed easily.  All other systems reviewed and are negative.      Objective:   Physical Exam Vitals and nursing note reviewed.  Constitutional:      Appearance: Normal appearance. He is well-developed.  HENT:     Head: Normocephalic.     Nose: Nose normal.     Mouth/Throat:     Mouth: Mucous membranes are moist.     Pharynx: Oropharynx is clear.  Eyes:     Pupils: Pupils are equal,  round, and reactive to light.  Neck:     Thyroid: No thyroid mass or thyromegaly.     Vascular: No carotid bruit or JVD.     Trachea: Phonation normal.  Cardiovascular:     Rate and Rhythm: Normal rate and regular rhythm.  Pulmonary:     Effort: Pulmonary effort is normal. No respiratory distress.     Breath sounds: Normal breath sounds.  Abdominal:     General: Bowel sounds are normal.     Palpations: Abdomen is soft.     Tenderness: There is no abdominal tenderness.  Musculoskeletal:        General: Normal range of motion.     Cervical back: Normal range of motion and neck supple.  Lymphadenopathy:     Cervical: No cervical adenopathy.  Skin:    General: Skin is warm and dry.  Neurological:     Mental Status: He is alert and oriented to person, place, and time.  Psychiatric:        Behavior: Behavior normal.        Thought Content: Thought content normal.        Judgment: Judgment normal.    BP 107/72   Pulse 88   Temp 98.3 F (36.8 C) (Temporal)   Resp 20   Ht '5\' 11"'  (1.803 m)   Wt 251 lb (113.9 kg)   SpO2 96%   BMI 35.01 kg/m    HGBA1c 7.2%     Assessment & Plan:   Eloise Mula comes in today with chief complaint of Medical Management of Chronic Issues   Diagnosis and orders addressed:  1. Diabetes mellitus type 2 in obese (HCC) Continue to watch carbs in diet - Bayer DCA Hb A1c Waived - Microalbumin / creatinine urine ratio - liraglutide (VICTOZA) 18 MG/3ML SOPN; Start 0.3m SQ once a day for 7 days, then increase to 1.266monce a day  Dispense: 6 mL; Refill: 0  2. Hyperlipidemia associated with type 2 diabetes mellitus (HCC) Low fat diet - CBC with Differential/Platelet - CMP14+EGFR - Lipid panel  3. Screening for prostate cancer Labs oending - PSA, total and free  4.  Hypopituitarism ([place]) Labs pending  5. Gastroesophageal reflux disease, unspecified whether esophagitis present Avoid spicy foods Do not eat 2 hours prior to bedtime  -  omeprazole (PRILOSEC OTC) 20 MG tablet; Take 1 tablet (20 mg total) by mouth daily as needed (acid reflux).  Dispense: 90 tablet; Refill: 1  6. OSA on CPAP Continue to wear CPAP  7. Other depression Stress management  8. Vitamin D deficiency Continue vitamin d suppleemnt  9. DDD (degenerative disc disease), cervical Moist heat 'rest - carisoprodol (SOMA) 350 MG tablet; TAKE (1) TABLET BY MOUTH THREE TIMES DAILYAS NEEDED.  Dispense: 30 tablet; Refill: 0 - ibuprofen (ADVIL) 800 MG tablet; Take 1 tablet (800 mg total) by mouth in the morning and at bedtime.  Dispense: 60 tablet; Refill: 1  10. Class 2 severe obesity with serious comorbidity and body mass index (BMI) of 36.0 to 36.9 in adult, unspecified obesity type ([place]) Discussed diet and exercise for person with BMI >25 Will recheck weight in 3-6 months    Labs pending Health Maintenance reviewed Diet and exercise encouraged  Follow up plan: 6 months   Mary-Margaret Hassell Done, FNP

## 2022-05-12 LAB — CBC WITH DIFFERENTIAL/PLATELET
Basophils Absolute: 0 10*3/uL (ref 0.0–0.2)
Basos: 0 %
EOS (ABSOLUTE): 0.1 10*3/uL (ref 0.0–0.4)
Eos: 1 %
Hematocrit: 49.1 % (ref 37.5–51.0)
Hemoglobin: 17 g/dL (ref 13.0–17.7)
Immature Grans (Abs): 0.1 10*3/uL (ref 0.0–0.1)
Immature Granulocytes: 1 %
Lymphocytes Absolute: 3 10*3/uL (ref 0.7–3.1)
Lymphs: 37 %
MCH: 30.5 pg (ref 26.6–33.0)
MCHC: 34.6 g/dL (ref 31.5–35.7)
MCV: 88 fL (ref 79–97)
Monocytes Absolute: 0.8 10*3/uL (ref 0.1–0.9)
Monocytes: 10 %
Neutrophils Absolute: 4.3 10*3/uL (ref 1.4–7.0)
Neutrophils: 51 %
Platelets: 279 10*3/uL (ref 150–450)
RBC: 5.58 x10E6/uL (ref 4.14–5.80)
RDW: 12.7 % (ref 11.6–15.4)
WBC: 8.3 10*3/uL (ref 3.4–10.8)

## 2022-05-12 LAB — CMP14+EGFR
ALT: 58 IU/L — ABNORMAL HIGH (ref 0–44)
AST: 28 IU/L (ref 0–40)
Albumin/Globulin Ratio: 2 (ref 1.2–2.2)
Albumin: 4.5 g/dL (ref 3.9–4.9)
Alkaline Phosphatase: 60 IU/L (ref 44–121)
BUN/Creatinine Ratio: 11 (ref 10–24)
BUN: 10 mg/dL (ref 8–27)
Bilirubin Total: 0.4 mg/dL (ref 0.0–1.2)
CO2: 24 mmol/L (ref 20–29)
Calcium: 9.5 mg/dL (ref 8.6–10.2)
Chloride: 99 mmol/L (ref 96–106)
Creatinine, Ser: 0.94 mg/dL (ref 0.76–1.27)
Globulin, Total: 2.3 g/dL (ref 1.5–4.5)
Glucose: 164 mg/dL — ABNORMAL HIGH (ref 70–99)
Potassium: 4.8 mmol/L (ref 3.5–5.2)
Sodium: 138 mmol/L (ref 134–144)
Total Protein: 6.8 g/dL (ref 6.0–8.5)
eGFR: 90 mL/min/{1.73_m2} (ref >59)

## 2022-05-12 LAB — LIPID PANEL
Chol/HDL Ratio: 2.9 ratio (ref 0.0–5.0)
Cholesterol, Total: 99 mg/dL — ABNORMAL LOW (ref 100–199)
HDL: 34 mg/dL — ABNORMAL LOW (ref >39)
LDL Chol Calc (NIH): 48 mg/dL (ref 0–99)
Triglycerides: 81 mg/dL (ref 0–149)
VLDL Cholesterol Cal: 17 mg/dL (ref 5–40)

## 2022-05-12 LAB — PSA, TOTAL AND FREE
PSA, Free Pct: 22.7 %
PSA, Free: 0.5 ng/mL
Prostate Specific Ag, Serum: 2.2 ng/mL (ref 0.0–4.0)

## 2022-05-12 LAB — MICROALBUMIN / CREATININE URINE RATIO
Creatinine, Urine: 35.4 mg/dL
Microalb/Creat Ratio: 14 mg/g creat (ref 0–29)
Microalbumin, Urine: 5.1 ug/mL

## 2022-05-13 NOTE — Progress Notes (Signed)
Chief Complaint:   OBESITY Roger Guerra is here to discuss his progress with his obesity treatment plan along with follow-up of his obesity related diagnoses. Roger Guerra is on keeping a food journal and adhering to recommended goals of 2000 calories and 100+ gms protein and states he is following his eating plan approximately 70% of the time. Roger Guerra states he is not currently exercising.  Today's visit was #: 20 Starting weight: 259 lbs Starting date: 02/17/2021 Today's weight: 245 lbs Today's date: 05/10/22 Total lbs lost to date: 14 Total lbs lost since last in-office visit: +1  Interim History: He is up 1 pound since his last visit.  Subjective:   1. Gastroesophageal reflux disease, unspecified whether esophagitis present Taking Prilosec.  2. Diabetes mellitus type 2 in obese [facility]) Trulicity is too expensive.  Assessment/Plan:   1. Gastroesophageal reflux disease, unspecified whether esophagitis present Continue Prilosec.  2. Diabetes mellitus type 2 in obese ([place]) Discontinue Trulicity.  3. Obesity with current BMI of 34.2 1.  Meal planning 2.  Will adhere closely to the plan.  Roger Guerra is currently in the action stage of change. As such, his goal is to continue with weight loss efforts. He has agreed to keeping a food journal and adhering to recommended goals of 2000 calories and 100 gms protein.   Exercise goals: Older adults should follow the adult guidelines. When older adults cannot meet the adult guidelines, they should be as physically active as their abilities and conditions will allow.   Behavioral modification strategies: increasing lean protein intake, decreasing simple carbohydrates, increasing vegetables, increasing water intake, decreasing eating out, no skipping meals, meal planning and cooking strategies, keeping healthy foods in the home, and planning for success.  Roger Guerra has agreed to follow-up with our clinic in 4 weeks with [facility] and 8 weeks with me. He was  informed of the importance of frequent follow-up visits to maximize his success with intensive lifestyle modifications for his multiple health conditions.    Objective:   Blood pressure 110/70, pulse 90, temperature 97.6 F (36.4 C), height '5\' 11"'$  (1.803 m), weight 245 lb (111.1 kg), SpO2 98 %. Body mass index is 34.17 kg/m.  General: Cooperative, alert, well developed, in no acute distress. HEENT: Conjunctivae and lids unremarkable. Cardiovascular: Regular rhythm.  Lungs: Normal work of breathing. Neurologic: No focal deficits.   Lab Results  Component Value Date   CREATININE 0.94 05/11/2022   BUN 10 05/11/2022   NA 138 05/11/2022   K 4.8 05/11/2022   CL 99 05/11/2022   CO2 24 05/11/2022   Lab Results  Component Value Date   ALT 58 (H) 05/11/2022   AST 28 05/11/2022   ALKPHOS 60 05/11/2022   BILITOT 0.4 05/11/2022   Lab Results  Component Value Date   HGBA1C 7.2 (H) 05/11/2022   HGBA1C 7.4 (H) 02/18/2022   HGBA1C 8.3 (H) 10/06/2021   HGBA1C 6.6 04/14/2021   HGBA1C 6.8 (H) 02/17/2021   No results found for: "INSULIN" Lab Results  Component Value Date   TSH 1.470 02/17/2021   Lab Results  Component Value Date   CHOL 99 (L) 05/11/2022   HDL 34 (L) 05/11/2022   LDLCALC 48 05/11/2022   TRIG 81 05/11/2022   CHOLHDL 2.9 05/11/2022   Lab Results  Component Value Date   VD25OH 33.6 10/06/2021   VD25OH 42.2 06/16/2021   Lab Results  Component Value Date   WBC 8.3 05/11/2022   HGB 17.0 05/11/2022   HCT 49.1 05/11/2022  MCV 88 05/11/2022   PLT 279 05/11/2022   No results found for: "IRON", "TIBC", "FERRITIN"   Attestation Statements:   Reviewed by clinician on day of visit: allergies, medications, problem list, medical history, surgical history, family history, social history, and previous encounter notes.  I, Dawn Whitmire, FNP-C, am acting as transcriptionist for Dr. Jearld Lesch.  I have reviewed the above documentation for accuracy and completeness,  and I agree with the above. Jearld Lesch, DO

## 2022-05-17 ENCOUNTER — Encounter (INDEPENDENT_AMBULATORY_CARE_PROVIDER_SITE_OTHER): Payer: Self-pay | Admitting: Bariatrics

## 2022-05-18 ENCOUNTER — Encounter: Payer: Self-pay | Admitting: General Surgery

## 2022-05-18 ENCOUNTER — Ambulatory Visit: Payer: Medicare HMO | Admitting: General Surgery

## 2022-05-18 VITALS — BP 132/79 | HR 86 | Temp 98.0°F | Resp 16 | Ht 71.0 in | Wt 255.0 lb

## 2022-05-18 DIAGNOSIS — K602 Anal fissure, unspecified: Secondary | ICD-10-CM | POA: Diagnosis not present

## 2022-05-18 MED ORDER — HYDROCORTISONE 1 % EX CREA: TOPICAL_CREAM | CUTANEOUS | 2 refills | Status: AC

## 2022-05-19 NOTE — Progress Notes (Signed)
Roger Guerra; [id number]; [birth year]   HPI Patient is a 66 year old white male who was referred to my care by Mary-Margaret Hassell Done for evaluation and treatment of bright red blood per rectum.  Patient has had this occurring since recent eye surgery.  He notices bright red blood on the toilet paper when he wipes himself.  He occasionally has a burning sensation in the anus after having a bowel movement.  His usual bowel movements are daily but have been thrown off where he has some loose stools couple times a day or now is having bowel movements every other day.  He started taking milk of magnesia nightly.  He had a recent colonoscopy which was unremarkable.  He denies any abdominal pain.  His last episode of bright red blood on the toilet paper was last week. Past Medical History:  Diagnosis Date   Anxiety disorder    Arthritis    Atherosclerosis    Back pain    Constipation    Coronary artery calcification seen on CT scan    Depression    Diabetes (HCC)    Erectile dysfunction    GERD (gastroesophageal reflux disease)    Heartburn    History of blood clots    History of DVT (deep vein thrombosis)    History of DVT of lower extremity    Right leg 2002    History of pneumonia    Hyperlipidemia    Hypogonadism in male    Hypopituitarism ([place])    Joint pain    Nerve root and plexus disorder    Obesity    Obstructive sleep apnea    OSA on CPAP    Osteoarthritis    Sciatica    Sleep apnea    SOBOE (shortness of breath on exertion)    Spinal stenosis    Spondylosis    Swelling of both lower extremities    Type 2 diabetes mellitus ([place])    Vitamin D deficiency     Past Surgical History:  Procedure Laterality Date   BLADDER SURGERY  2015   removal of skin tag near urethra   COLONOSCOPY N/A 12/10/2016   Procedure: COLONOSCOPY;  Surgeon: Daneil Dolin, MD;  [facility]: [facility];  Service: Endoscopy;  Laterality: N/A;  8:30 AM   COLONOSCOPY WITH PROPOFOL N/A 04/29/2022    Procedure: COLONOSCOPY WITH PROPOFOL;  Surgeon: Daneil Dolin, MD;  [facility]: [facility];  Service: Endoscopy;  Laterality: N/A;  8:00am   KNEE ARTHROSCOPY WITH MEDIAL MENISECTOMY Right 08/25/2017   Procedure: Right knee arthroscopy, partial medial menisectomy and debridement;  Surgeon: Susa Day, MD;  Location: [facility];  Service: Orthopedics;  Laterality: Right;  60 mins   POLYPECTOMY  12/10/2016   Procedure: POLYPECTOMY;  Surgeon: Daneil Dolin, MD;  [facility];  Service: Endoscopy;;  hepatic flexure   PTOSIS REPAIR Bilateral 02/2022   TONSILLECTOMY      Family History  Problem Relation Age of Onset   Diabetes Mother    Lung cancer Father    Heart disease Father    Stroke Father    Cancer Father    Diabetes Sister    Diabetes Other        Siblings    Current Outpatient Medications on File Prior to Visit  Medication Sig Dispense Refill   5-Hydroxytryptophan (5-HTP PO) Take 150 mg by mouth daily.     Alirocumab (PRALUENT) 75 MG/ML SOAJ Inject 1 Dose into the skin every 14 (fourteen) days. 6  mL 3   AMBULATORY NON FORMULARY MEDICATION Medication Name: Trimix  PGE 30 mcg Pap 30 mg Phent 1 mg (Patient taking differently: daily as needed (ED). Medication Name: Trimix  PGE 30 mcg Pap 30 mg Phent 1 mg) 5 mL 5   aspirin 81 MG tablet Take 81 mg by mouth daily.     carisoprodol (SOMA) 350 MG tablet TAKE (1) TABLET BY MOUTH THREE TIMES DAILYAS NEEDED. 30 tablet 0   cetirizine (ZYRTEC) 10 MG tablet Take 10 mg by mouth every evening.      Cholecalciferol (VITAMIN D3 PO) Take 1 tablet by mouth daily.     co-enzyme Q-10 30 MG capsule Take 30 mg by mouth daily.     fexofenadine (ALLEGRA) 180 MG tablet Take 180 mg by mouth every morning.      glucose blood test strip Use OneTouch Ultra test strips to check blood sugar once daily. DX:E11.69 100 each 0   ibuprofen (ADVIL) 800 MG tablet Take 1 tablet (800 mg total) by mouth in the morning and at bedtime. 60 tablet 1    liraglutide (VICTOZA) 18 MG/3ML SOPN Start 0.'6mg'$  SQ once a day for 7 days, then increase to 1.'2mg'$  once a day 6 mL 0   magnesium oxide (MAG-OX) 400 (240 Mg) MG tablet Take 400 mg by mouth daily.     Multiple Vitamin (MULTIVITAMIN WITH MINERALS) TABS tablet Take 1 tablet by mouth daily.     Omega-3 Fatty Acids (FISH OIL PO) Take 1,000 Units by mouth daily.     omeprazole (PRILOSEC OTC) 20 MG tablet Take 1 tablet (20 mg total) by mouth daily as needed (acid reflux). 90 tablet 1   ondansetron (ZOFRAN) 4 MG tablet Take 1 tablet (4 mg total) by mouth every 8 (eight) hours as needed for nausea or vomiting. 20 tablet 1   Scar Treatment Products (MEDERMA) GEL Apply 1 Application topically 2 (two) times daily.     silodosin (RAPAFLO) 8 MG CAPS capsule Take 1 capsule (8 mg total) by mouth in the morning and at bedtime. 180 capsule 3   sodium chloride (OCEAN) 0.65 % SOLN nasal spray Place 1 spray into both nostrils 2 (two) times daily.     SYRINGE-NEEDLE, DISP, 3 ML (LUER LOCK SAFETY SYRINGES) 22G X 1" 3 ML MISC For testosterone  injections 30 each 1   testosterone cypionate (DEPOTESTOSTERONE CYPIONATE) 200 MG/ML injection Inject 1 mL (200 mg total) into the muscle every 14 (fourteen) days. Give 1 ml every 10 days (Patient taking differently: Inject 200 mg into the muscle See admin instructions. Give 1 ml every 10 days) 10 mL 3   vitamin E 400 UNIT capsule Take 400 Units by mouth daily.     White Petrolatum-Mineral Oil (REFRESH P.M. OP) Place 1 drop into both eyes at bedtime.     zinc gluconate 50 MG tablet Take 50 mg by mouth daily.     ezetimibe (ZETIA) 10 MG tablet Take 1 tablet (10 mg total) by mouth daily. 90 tablet 1   No current facility-administered medications on file prior to visit.    Allergies  Allergen Reactions   Invokana [Canagliflozin] Other (See Comments)    Yeast infection   Statins Other (See Comments)    Severe muscle cramps    Social History   Substance and Sexual Activity   Alcohol Use No   Alcohol/week: 0.0 standard drinks of alcohol    Social History   Tobacco Use  Smoking Status Former   Packs/day:  2.50   Years: 15.00   Total pack years: 37.50   Types: Cigarettes  Smokeless Tobacco Never    Review of Systems  Constitutional:  Positive for malaise/fatigue.  HENT:  Positive for sinus pain.   Eyes: Negative.   Respiratory: Negative.    Cardiovascular: Negative.   Gastrointestinal:  Positive for heartburn and nausea.  Genitourinary:  Positive for frequency.  Musculoskeletal:  Positive for back pain, joint pain and neck pain.  Neurological: Negative.   Endo/Heme/Allergies:  Does not bruise/bleed easily.  Psychiatric/Behavioral: Negative.      Objective   Vitals:   05/18/22 1513  BP: 132/79  Pulse: 86  Resp: 16  Temp: 98 F (36.7 C)  SpO2: 92%    Physical Exam Vitals reviewed.  Constitutional:      Appearance: Normal appearance. He is not ill-appearing.  HENT:     Head: Normocephalic and atraumatic.  Cardiovascular:     Rate and Rhythm: Normal rate and regular rhythm.     Heart sounds: Normal heart sounds. No murmur heard.    No friction rub. No gallop.  Pulmonary:     Effort: Pulmonary effort is normal. No respiratory distress.     Breath sounds: Normal breath sounds. No stridor. No wheezing, rhonchi or rales.  Abdominal:     General: Bowel sounds are normal. There is no distension.     Palpations: Abdomen is soft. There is no mass.     Tenderness: There is no abdominal tenderness. There is no guarding or rebound.     Hernia: No hernia is present.  Genitourinary:    Comments: Digital rectal examination reveals no significant internal hemorrhoidal disease.  There is a small external hemorrhoidal skin tag at the 5 o'clock position.  He does have some tenderness and scarring anteriorly at the anal verge.  No discrete anal fissure is palpated or seen.  No blood is present. Skin:    General: Skin is warm and dry.  Neurological:      Mental Status: He is alert and oriented to person, place, and time.    Colonoscopy report reviewed Assessment  I suspect the patient may have had an anal fissure that is healing.  No other pathology is present. Plan  I gave the patient Anusol HC cream should he have another episode of bright red blood per rectum.  Literature was given concerning anal fissures.  I told him to increase the fiber in his diet and stop the milk of magnesia.  He will monitor the situation and return my care should the symptoms recur.  No need for surgical intervention at this time.

## 2022-05-28 DIAGNOSIS — M546 Pain in thoracic spine: Secondary | ICD-10-CM | POA: Diagnosis not present

## 2022-05-28 DIAGNOSIS — M9902 Segmental and somatic dysfunction of thoracic region: Secondary | ICD-10-CM | POA: Diagnosis not present

## 2022-05-28 DIAGNOSIS — M9901 Segmental and somatic dysfunction of cervical region: Secondary | ICD-10-CM | POA: Diagnosis not present

## 2022-05-28 DIAGNOSIS — M542 Cervicalgia: Secondary | ICD-10-CM | POA: Diagnosis not present

## 2022-06-03 DIAGNOSIS — M25562 Pain in left knee: Secondary | ICD-10-CM | POA: Diagnosis not present

## 2022-06-03 NOTE — Progress Notes (Unsigned)
TeleHealth Visit:  This visit was completed with telemedicine (audio/video) technology. Roger Guerra has verbally consented to this TeleHealth visit. The patient is located at home, the provider is located at home. The participants in this visit include the listed provider and patient. The visit was conducted today via MyChart video.  OBESITY Roger Guerra is here to discuss his progress with his obesity treatment plan along with follow-up of his obesity related diagnoses.   Today's visit was # 20 Starting weight: 259 lbs Starting date: 02/17/2021 Weight at last in office visit: 245 lbs on 05/10/22 Total weight loss: 14 lbs at last in office visit on 05/10/22. Today's reported weight: *** lbs No weight reported.  Nutrition Plan: keeping a food journal and adhering to recommended goals of 2000 calories and 100 gms protein.  Hunger is {EWCONTROLASSESSMENT:24261}. Cravings are {EWCONTROLASSESSMENT:24261}.  Current exercise: {exercise types:16438}  Interim History: ***  Assessment/Plan:  1. ***  2. ***  3. ***  Obesity: Current BMI *** Roger Guerra {CHL AMB IS/IS NOT:210130109} currently in the action stage of change. As such, his goal is to {MWMwtloss#1:210800005}.  He has agreed to {MWMwtlossportion/plan2:23431}.   Exercise goals: {MWM EXERCISE RECS:23473}  Behavioral modification strategies: {MWMwtlossdietstrategies3:23432}.  Roger Guerra has agreed to follow-up with our clinic in {NUMBER 1-10:22536} weeks.   No orders of the defined types were placed in this encounter.   There are no discontinued medications.   No orders of the defined types were placed in this encounter.     Objective:   VITALS: Per patient if applicable, see vitals. GENERAL: Alert and in no acute distress. CARDIOPULMONARY: No increased WOB. Speaking in clear sentences.  PSYCH: Pleasant and cooperative. Speech normal rate and rhythm. Affect is appropriate. Insight and judgement are appropriate. Attention is focused,  linear, and appropriate.  NEURO: Oriented as arrived to appointment on time with no prompting.   Lab Results  Component Value Date   CREATININE 0.94 05/11/2022   BUN 10 05/11/2022   NA 138 05/11/2022   K 4.8 05/11/2022   CL 99 05/11/2022   CO2 24 05/11/2022   Lab Results  Component Value Date   ALT 58 (H) 05/11/2022   AST 28 05/11/2022   ALKPHOS 60 05/11/2022   BILITOT 0.4 05/11/2022   Lab Results  Component Value Date   HGBA1C 7.2 (H) 05/11/2022   HGBA1C 7.4 (H) 02/18/2022   HGBA1C 8.3 (H) 10/06/2021   HGBA1C 6.6 04/14/2021   HGBA1C 6.8 (H) 02/17/2021   No results found for: "INSULIN" Lab Results  Component Value Date   TSH 1.470 02/17/2021   Lab Results  Component Value Date   CHOL 99 (L) 05/11/2022   HDL 34 (L) 05/11/2022   LDLCALC 48 05/11/2022   TRIG 81 05/11/2022   CHOLHDL 2.9 05/11/2022   Lab Results  Component Value Date   WBC 8.3 05/11/2022   HGB 17.0 05/11/2022   HCT 49.1 05/11/2022   MCV 88 05/11/2022   PLT 279 05/11/2022   No results found for: "IRON", "TIBC", "FERRITIN" Lab Results  Component Value Date   VD25OH 33.6 10/06/2021   VD25OH 42.2 06/16/2021    Attestation Statements:   Reviewed by clinician on day of visit: allergies, medications, problem list, medical history, surgical history, family history, social history, and previous encounter notes.  ***(delete if time-based billing not used) Time spent on visit including the items listed below was *** minutes.  -preparing to see the patient (e.g., review of tests, history, previous notes) -obtaining and/or reviewing separately obtained history -counseling  and educating the patient/family/caregiver -documenting clinical information in the electronic or other health record -ordering medications, tests, or procedures -independently interpreting results and communicating results to the patient/ family/caregiver -referring and communicating with other health care professionals  -care  coordination

## 2022-06-07 ENCOUNTER — Encounter (INDEPENDENT_AMBULATORY_CARE_PROVIDER_SITE_OTHER): Payer: Self-pay | Admitting: Family Medicine

## 2022-06-07 ENCOUNTER — Telehealth (INDEPENDENT_AMBULATORY_CARE_PROVIDER_SITE_OTHER): Payer: Medicare HMO | Admitting: Family Medicine

## 2022-06-07 DIAGNOSIS — Z6835 Body mass index (BMI) 35.0-35.9, adult: Secondary | ICD-10-CM

## 2022-06-07 DIAGNOSIS — E785 Hyperlipidemia, unspecified: Secondary | ICD-10-CM | POA: Diagnosis not present

## 2022-06-07 DIAGNOSIS — E1165 Type 2 diabetes mellitus with hyperglycemia: Secondary | ICD-10-CM | POA: Diagnosis not present

## 2022-06-07 DIAGNOSIS — E669 Obesity, unspecified: Secondary | ICD-10-CM | POA: Diagnosis not present

## 2022-06-07 DIAGNOSIS — Z7985 Long-term (current) use of injectable non-insulin antidiabetic drugs: Secondary | ICD-10-CM

## 2022-06-07 DIAGNOSIS — E1169 Type 2 diabetes mellitus with other specified complication: Secondary | ICD-10-CM | POA: Diagnosis not present

## 2022-06-07 MED ORDER — GLUCOSE BLOOD VI STRP: ORAL_STRIP | 1 refills | Status: DC

## 2022-06-07 MED ORDER — RYBELSUS 7 MG PO TABS: 7.0000 mg | ORAL_TABLET | Freq: Every day | ORAL | 0 refills | Status: DC

## 2022-06-07 MED ORDER — RYBELSUS 7 MG PO TABS
ORAL_TABLET | ORAL | 0 refills | Status: DC
Start: 2022-06-07 — End: 2022-08-17

## 2022-06-08 DIAGNOSIS — M5412 Radiculopathy, cervical region: Secondary | ICD-10-CM | POA: Diagnosis not present

## 2022-06-10 ENCOUNTER — Encounter: Payer: Self-pay | Admitting: Nurse Practitioner

## 2022-06-10 ENCOUNTER — Ambulatory Visit (INDEPENDENT_AMBULATORY_CARE_PROVIDER_SITE_OTHER): Payer: Medicare HMO | Admitting: Nurse Practitioner

## 2022-06-10 DIAGNOSIS — J069 Acute upper respiratory infection, unspecified: Secondary | ICD-10-CM

## 2022-06-10 MED ORDER — PREDNISONE 20 MG PO TABS: 40.0000 mg | ORAL_TABLET | Freq: Every day | ORAL | 0 refills | Status: AC

## 2022-06-10 MED ORDER — HYDROCODONE BIT-HOMATROP MBR 5-1.5 MG/5ML PO SOLN: 5.0000 mL | Freq: Four times a day (QID) | ORAL | 0 refills | Status: DC | PRN

## 2022-06-10 MED ORDER — AZITHROMYCIN 250 MG PO TABS: ORAL_TABLET | ORAL | 0 refills | Status: DC

## 2022-06-10 NOTE — Patient Instructions (Signed)
1. Take meds as prescribed 2. Use a cool mist humidifier especially during the winter months and when heat has been humid. 3. Use saline nose sprays frequently 4. Saline irrigations of the nose can be very helpful if done frequently.  * 4X daily for 1 week*  * Use of a nettie pot can be helpful with this. Follow directions with this* 5. Drink plenty of fluids 6. Keep thermostat turn down low 7.For any cough or congestion- hycodan 8. For fever or aces or pains- take tylenol or ibuprofen appropriate for age and weight.  * for fevers greater than 101 orally you may alternate ibuprofen and tylenol every  3 hours.    

## 2022-06-10 NOTE — Progress Notes (Signed)
Virtual Visit  Note Due to COVID-19 pandemic this visit was conducted virtually. This visit type was conducted due to national recommendations for restrictions regarding the COVID-19 Pandemic (e.g. social distancing, sheltering in place) in an effort to limit this patient's exposure and mitigate transmission in our community. All issues noted in this document were discussed and addressed.  A physical exam was not performed with this format.  I connected with Roger Guerra on 06/10/22 at 8:15 by telephone and verified that I am speaking with the correct person using two identifiers. Roger Guerra is currently located at home and his wife is currently with him during visit. The provider, Mary-Margaret Hassell Done, FNP is located in their office at time of visit.  I discussed the limitations, risks, security and privacy concerns of performing an evaluation and management service by telephone and the availability of in person appointments. I also discussed with the patient that there may be a patient responsible charge related to this service. The patient expressed understanding and agreed to proceed.   History and Present Illness:  Patient calls in with URI symptoms. His wife was sick last week.  URI  This is a new problem. The current episode started in the past 7 days. The problem has been gradually worsening. The maximum temperature recorded prior to his arrival was 100.4 - 100.9 F. The fever has been present for 1 to 2 days. Associated symptoms include congestion, coughing, headaches and rhinorrhea. Pertinent negatives include no sore throat. He has tried decongestant for the symptoms. The treatment provided mild relief.      Review of Systems  HENT:  Positive for congestion and rhinorrhea. Negative for sore throat.   Respiratory:  Positive for cough.   Neurological:  Positive for headaches.     Observations/Objective: Alert and oriented- answers all questions appropriately No  distress Raspy voice Deep cough  Assessment and Plan: Roger Guerra in today with chief complaint of No chief complaint on file.   1. URI with cough and congestion 1. Take meds as prescribed 2. Use a cool mist humidifier especially during the winter months and when heat has been humid. 3. Use saline nose sprays frequently 4. Saline irrigations of the nose can be very helpful if done frequently.  * 4X daily for 1 week*  * Use of a nettie pot can be helpful with this. Follow directions with this* 5. Drink plenty of fluids 6. Keep thermostat turn down low 7.For any cough or congestion- hycodan 8. For fever or aces or pains- take tylenol or ibuprofen appropriate for age and weight.  * for fevers greater than 101 orally you may alternate ibuprofen and tylenol every  3 hours.    Meds ordered this encounter  Medications   azithromycin (ZITHROMAX Z-PAK) 250 MG tablet    Sig: As directed    Dispense:  6 tablet    Refill:  0    Order Specific Question:   Supervising Provider    Answer:   Caryl Pina A [[id number]]   predniSONE (DELTASONE) 20 MG tablet    Sig: Take 2 tablets (40 mg total) by mouth daily with breakfast for 5 days. 2 po daily for 5 days    Dispense:  10 tablet    Refill:  0    Order Specific Question:   Supervising Provider    Answer:   Caryl Pina A [[id number]]   HYDROcodone bit-homatropine (HYCODAN) 5-1.5 MG/5ML syrup    Sig: Take 5 mLs by mouth every 6 (six)  hours as needed for cough.    Dispense:  120 mL    Refill:  0    Order Specific Question:   Supervising Provider    Answer:   Caryl Pina A [[id number]]        Follow Up Instructions: prn    I discussed the assessment and treatment plan with the patient. The patient was provided an opportunity to ask questions and all were answered. The patient agreed with the plan and demonstrated an understanding of the instructions.   The patient was advised to call back or seek an in-person evaluation  if the symptoms worsen or if the condition fails to improve as anticipated.  The above assessment and management plan was discussed with the patient. The patient verbalized understanding of and has agreed to the management plan. Patient is aware to call the clinic if symptoms persist or worsen. Patient is aware when to return to the clinic for a follow-up visit. Patient educated on when it is appropriate to go to the emergency department.   Time call ended:  8:30  I provided 15 minutes of  non face-to-face time during this encounter.    Mary-Margaret Hassell Done, FNP

## 2022-06-18 ENCOUNTER — Ambulatory Visit: Payer: Medicare HMO | Admitting: Urology

## 2022-06-21 ENCOUNTER — Ambulatory Visit: Payer: Medicare HMO | Admitting: Urology

## 2022-06-21 VITALS — BP 131/83 | HR 87

## 2022-06-21 DIAGNOSIS — R339 Retention of urine, unspecified: Secondary | ICD-10-CM | POA: Diagnosis not present

## 2022-06-21 DIAGNOSIS — R300 Dysuria: Secondary | ICD-10-CM

## 2022-06-21 DIAGNOSIS — N138 Other obstructive and reflux uropathy: Secondary | ICD-10-CM | POA: Diagnosis not present

## 2022-06-21 DIAGNOSIS — N401 Enlarged prostate with lower urinary tract symptoms: Secondary | ICD-10-CM | POA: Diagnosis not present

## 2022-06-21 MED ORDER — CIPROFLOXACIN HCL 500 MG PO TABS
500.0000 mg | ORAL_TABLET | Freq: Once | ORAL | Status: AC
  Administered 2022-06-21: 500 mg via ORAL

## 2022-06-21 MED ORDER — FINASTERIDE 5 MG PO TABS: 5.0000 mg | ORAL_TABLET | Freq: Every day | ORAL | 3 refills | Status: DC

## 2022-06-21 NOTE — Progress Notes (Unsigned)
post void residual=351

## 2022-06-21 NOTE — Progress Notes (Unsigned)
Assessment: 1. Incomplete bladder emptying - Urinalysis, Routine w reflex microscopic - BLADDER SCAN AMB NON-IMAGING  2. Benign prostatic hyperplasia with urinary obstruction - Urinalysis, Routine w reflex microscopic - BLADDER SCAN AMB NON-IMAGING    Plan: ***  Chief Complaint: No chief complaint on file.   HPI: Roger Guerra is a 66 y.o. male who presents for continued evaluation of BPH and incomplete emptying. Pt c/o straining and post void dribbling on increased dose of silodocin. No dysuria, hematuria.  Recent increase in frequency attributed to several treatments with steroids which have increased his blood sugars to 3 and 400.  Nocturia x2.  UA=clear except 3+ glucose PVR=351 IPSS=  17         QOL=3  03/17/22 Roger Guerra is a 65yo here for followup for BPH and incomplete emptying. PVR is 405cc. IPSS 19 QOL 3. He remains on rapaflo 8mg  daily. Urine stream strong. NO straining to urinate. Nocturia 2-3x. No other complaints today  Portions of the above documentation were copied from a prior visit for review purposes only.  Allergies: Allergies  Allergen Reactions   Invokana [Canagliflozin] Other (See Comments)    Yeast infection   Statins Other (See Comments)    Severe muscle cramps    PMH: Past Medical History:  Diagnosis Date   Anxiety disorder    Arthritis    Atherosclerosis    Back pain    Constipation    Coronary artery calcification seen on CT scan    Depression    Diabetes (HCC)    Erectile dysfunction    GERD (gastroesophageal reflux disease)    Heartburn    History of blood clots    History of DVT (deep vein thrombosis)    History of DVT of lower extremity    Right leg 2002    History of pneumonia    Hyperlipidemia    Hypogonadism in male    Hypopituitarism (HCC)    Joint pain    Nerve root and plexus disorder    Obesity    Obstructive sleep apnea    OSA on CPAP    Osteoarthritis    Sciatica    Sleep apnea    SOBOE (shortness of  breath on exertion)    Spinal stenosis    Spondylosis    Swelling of both lower extremities    Type 2 diabetes mellitus (HCC)    Vitamin D deficiency     PSH: Past Surgical History:  Procedure Laterality Date   BLADDER SURGERY  2015   removal of skin tag near urethra   COLONOSCOPY N/A 12/10/2016   Procedure: COLONOSCOPY;  Surgeon: Corbin Ade, MD;  [facility];  Service: Endoscopy;  Laterality: N/A;  8:30 AM   COLONOSCOPY WITH PROPOFOL N/A 04/29/2022   Procedure: COLONOSCOPY WITH PROPOFOL;  Surgeon: Corbin Ade, MD;  [facility];  Service: Endoscopy;  Laterality: N/A;  8:00am   KNEE ARTHROSCOPY WITH MEDIAL MENISECTOMY Right 08/25/2017   Procedure: Right knee arthroscopy, partial medial menisectomy and debridement;  Surgeon: Jene Every, MD;  Location: [facility];  Service: Orthopedics;  Laterality: Right;  60 mins   POLYPECTOMY  12/10/2016   Procedure: POLYPECTOMY;  Surgeon: Corbin Ade, MD;  [facility];  Service: Endoscopy;;  hepatic flexure   PTOSIS REPAIR Bilateral 02/2022   TONSILLECTOMY      SH: Social History   Tobacco Use   Smoking status: Former    Packs/day: 2.50    Years:  15.00    Total pack years: 37.50    Types: Cigarettes   Smokeless tobacco: Never  Vaping Use   Vaping Use: Never used  Substance Use Topics   Alcohol use: No    Alcohol/week: 0.0 standard drinks of alcohol   Drug use: No    ROS: All other review of systems were reviewed and are negative except what is noted above in HPI  PE: BP 131/83   Pulse 87  GENERAL APPEARANCE:  Well appearing, well developed, well nourished, NAD HEENT:  Atraumatic, normocephalic NECK:  Supple. Trachea midline ABDOMEN:  Soft, non-tender, no masses EXTREMITIES:  Moves all extremities well, without clubbing, cyanosis, or edema NEUROLOGIC:  Alert and oriented x 3, normal gait, CN II-XII grossly intact MENTAL STATUS:  appropriate BACK:  Non-tender to palpation, No  CVAT SKIN:  Warm, dry, and intact   Results: Laboratory Data: Lab Results  Component Value Date   WBC 8.3 05/11/2022   HGB 17.0 05/11/2022   HCT 49.1 05/11/2022   MCV 88 05/11/2022   PLT 279 05/11/2022    Lab Results  Component Value Date   CREATININE 0.94 05/11/2022    No results found for: "PSA"  Lab Results  Component Value Date   TESTOSTERONE 592 06/16/2021    Lab Results  Component Value Date   HGBA1C 7.2 (H) 05/11/2022    Urinalysis    Component Value Date/Time   APPEARANCEUR Clear 03/17/2022 1333   GLUCOSEU 3+ (A) 03/17/2022 1333   BILIRUBINUR Negative 03/17/2022 1333   PROTEINUR Negative 03/17/2022 1333   NITRITE Negative 03/17/2022 1333   LEUKOCYTESUR Negative 03/17/2022 1333    Lab Results  Component Value Date   LABMICR 5.1 05/11/2022    Pertinent Imaging: No results found for this or any previous visit.  No results found for this or any previous visit.  No results found for this or any previous visit.  No results found for this or any previous visit.  No results found for this or any previous visit.  No valid procedures specified. No results found for this or any previous visit.  No results found for this or any previous visit.  No results found for this or any previous visit (from the past 24 hour(s)).

## 2022-06-21 NOTE — Progress Notes (Unsigned)
   06/21/22  CC: difficulty urinating   HPI: Roger Guerra is a [phone] here for cystoscopy for difficulty urinating Blood pressure 131/83, pulse 87. NED. A&Ox3.   No respiratory distress   Abd soft, NT, ND Normal phallus with bilateral descended testicles  Cystoscopy Procedure Note  Patient identification was confirmed, informed consent was obtained, and patient was prepped using Betadine solution.  Lidocaine jelly was administered per urethral meatus.     Pre-Procedure: - Inspection reveals a normal caliber ureteral meatus.  Procedure: The flexible cystoscope was introduced without difficulty - No urethral strictures/lesions are present. - Enlarged prostate multiple prostatic varices - Elevated bladder neck - Bilateral ureteral orifices identified - Bladder mucosa  reveals no ulcers, tumors, or lesions - No bladder stones - No trabeculation  Retroflexion shows no intravesical prostatic protrusion   Post-Procedure: - Patient tolerated the procedure well  Assessment/ Plan: We discussed the management of his BPH including continued medical therapy, Rezum, Urolift, TURP and simple prostatectomy. After discussing the options the patient has elected to proceed with medical therapy. We will add finasteride '5mg'$  daily. Risks/benefits/alternatives discussed.   No follow-ups on file.  Nicolette Bang, MD

## 2022-06-22 DIAGNOSIS — H02834 Dermatochalasis of left upper eyelid: Secondary | ICD-10-CM | POA: Diagnosis not present

## 2022-06-22 DIAGNOSIS — H0279 Other degenerative disorders of eyelid and periocular area: Secondary | ICD-10-CM | POA: Diagnosis not present

## 2022-06-22 DIAGNOSIS — H57813 Brow ptosis, bilateral: Secondary | ICD-10-CM | POA: Diagnosis not present

## 2022-06-22 DIAGNOSIS — Z09 Encounter for follow-up examination after completed treatment for conditions other than malignant neoplasm: Secondary | ICD-10-CM | POA: Diagnosis not present

## 2022-06-22 DIAGNOSIS — H02831 Dermatochalasis of right upper eyelid: Secondary | ICD-10-CM | POA: Diagnosis not present

## 2022-06-22 LAB — URINALYSIS, ROUTINE W REFLEX MICROSCOPIC
Bilirubin, UA: NEGATIVE
Leukocytes,UA: NEGATIVE
Nitrite, UA: NEGATIVE
Protein,UA: NEGATIVE
RBC, UA: NEGATIVE
Specific Gravity, UA: 1.005 (ref 1.005–1.030)
Urobilinogen, Ur: 0.2 mg/dL (ref 0.2–1.0)
pH, UA: 5.5 (ref 5.0–7.5)

## 2022-06-30 ENCOUNTER — Other Ambulatory Visit: Payer: Self-pay | Admitting: Nurse Practitioner

## 2022-06-30 DIAGNOSIS — E1169 Type 2 diabetes mellitus with other specified complication: Secondary | ICD-10-CM

## 2022-07-05 ENCOUNTER — Ambulatory Visit: Payer: Self-pay | Admitting: Bariatrics

## 2022-07-14 DIAGNOSIS — M5030 Other cervical disc degeneration, unspecified cervical region: Secondary | ICD-10-CM | POA: Diagnosis not present

## 2022-07-14 DIAGNOSIS — M5412 Radiculopathy, cervical region: Secondary | ICD-10-CM | POA: Diagnosis not present

## 2022-07-19 ENCOUNTER — Other Ambulatory Visit: Payer: Self-pay | Admitting: Nurse Practitioner

## 2022-07-19 DIAGNOSIS — M5030 Other cervical disc degeneration, unspecified cervical region: Secondary | ICD-10-CM

## 2022-08-02 ENCOUNTER — Telehealth (INDEPENDENT_AMBULATORY_CARE_PROVIDER_SITE_OTHER): Payer: Medicare HMO | Admitting: Family Medicine

## 2022-08-03 ENCOUNTER — Telehealth (INDEPENDENT_AMBULATORY_CARE_PROVIDER_SITE_OTHER): Payer: Medicare HMO | Admitting: Family Medicine

## 2022-08-08 DIAGNOSIS — M25562 Pain in left knee: Secondary | ICD-10-CM | POA: Diagnosis not present

## 2022-08-09 ENCOUNTER — Other Ambulatory Visit (HOSPITAL_COMMUNITY): Payer: Self-pay | Admitting: Specialist

## 2022-08-09 ENCOUNTER — Ambulatory Visit (HOSPITAL_COMMUNITY)
Admission: RE | Admit: 2022-08-09 | Discharge: 2022-08-09 | Disposition: A | Discharge status: Home / Self Care | Payer: Medicare HMO | Source: Ambulatory Visit | Attending: Cardiology | Admitting: Cardiology

## 2022-08-09 DIAGNOSIS — M79604 Pain in right leg: Secondary | ICD-10-CM | POA: Diagnosis not present

## 2022-08-09 DIAGNOSIS — M7989 Other specified soft tissue disorders: Secondary | ICD-10-CM

## 2022-08-09 DIAGNOSIS — M79661 Pain in right lower leg: Secondary | ICD-10-CM | POA: Diagnosis not present

## 2022-08-09 DIAGNOSIS — M79605 Pain in left leg: Secondary | ICD-10-CM | POA: Diagnosis not present

## 2022-08-09 DIAGNOSIS — M25562 Pain in left knee: Secondary | ICD-10-CM | POA: Diagnosis not present

## 2022-08-17 ENCOUNTER — Ambulatory Visit (INDEPENDENT_AMBULATORY_CARE_PROVIDER_SITE_OTHER): Payer: Medicare HMO

## 2022-08-17 ENCOUNTER — Ambulatory Visit (INDEPENDENT_AMBULATORY_CARE_PROVIDER_SITE_OTHER): Payer: Medicare HMO | Admitting: Nurse Practitioner

## 2022-08-17 ENCOUNTER — Encounter: Payer: Self-pay | Admitting: Nurse Practitioner

## 2022-08-17 VITALS — BP 141/88 | HR 84 | Temp 97.3°F | Resp 20 | Ht 71.0 in | Wt 249.0 lb

## 2022-08-17 DIAGNOSIS — Z01818 Encounter for other preprocedural examination: Secondary | ICD-10-CM | POA: Diagnosis not present

## 2022-08-17 DIAGNOSIS — Z23 Encounter for immunization: Secondary | ICD-10-CM | POA: Diagnosis not present

## 2022-08-17 NOTE — Progress Notes (Signed)
   Subjective:    Patient ID: Purnell Daigle, male    DOB: 08/18/1956, 66 y.o.   MRN: [id number]   Chief Complaint: Surgical clearance   HPI Patient is going  to have a total knee replacement. He is here today for surgical clearance.     Review of Systems  Constitutional:  Negative for diaphoresis.  Eyes:  Negative for pain.  Respiratory:  Negative for shortness of breath.   Cardiovascular:  Negative for chest pain, palpitations and leg swelling.  Gastrointestinal:  Negative for abdominal pain.  Endocrine: Negative for polydipsia.  Skin:  Negative for rash.  Neurological:  Negative for dizziness, weakness and headaches.  Hematological:  Does not bruise/bleed easily.  All other systems reviewed and are negative.      Objective:   Physical Exam Vitals and nursing note reviewed.  Constitutional:      Appearance: Normal appearance. He is well-developed.  HENT:     Head: Normocephalic.     Nose: Nose normal.     Mouth/Throat:     Mouth: Mucous membranes are moist.     Pharynx: Oropharynx is clear.  Eyes:     Pupils: Pupils are equal, round, and reactive to light.  Neck:     Thyroid: No thyroid mass or thyromegaly.     Vascular: No carotid bruit or JVD.     Trachea: Phonation normal.  Cardiovascular:     Rate and Rhythm: Normal rate and regular rhythm.  Pulmonary:     Effort: Pulmonary effort is normal. No respiratory distress.     Breath sounds: Normal breath sounds.  Abdominal:     General: Bowel sounds are normal.     Palpations: Abdomen is soft.     Tenderness: There is no abdominal tenderness.  Musculoskeletal:        General: Normal range of motion.     Cervical back: Normal range of motion and neck supple.  Lymphadenopathy:     Cervical: No cervical adenopathy.  Skin:    General: Skin is warm and dry.  Neurological:     Mental Status: He is alert and oriented to person, place, and time.  Psychiatric:        Behavior: Behavior normal.        Thought  Content: Thought content normal.        Judgment: Judgment normal.    BP (!) 141/88   Pulse 84   Temp (!) 97.3 F (36.3 C) (Temporal)   Resp 20   Ht '5\' 11"'$  (1.803 m)   Wt 249 lb (112.9 kg)   SpO2 97%   BMI 34.73 kg/m    Chest xray clear-Preliminary reading by Ronnald Collum, FNP  [facility]  EKG- Kerry Hough, FNP      Assessment & Plan:   [name] in today with chief complaint of Surgical clearance   1. Preoperative clearance Cleared for surgery Will need labs prior to surgery - DG Chest 2 View - EKG 12-Lead    The above assessment and management plan was discussed with the patient. The patient verbalized understanding of and has agreed to the management plan. Patient is aware to call the clinic if symptoms persist or worsen. Patient is aware when to return to the clinic for a follow-up visit. Patient educated on when it is appropriate to go to the emergency department.   Mary-Margaret Hassell Done, FNP

## 2022-08-23 NOTE — Progress Notes (Signed)
Surgical orders requested via Epic inbox.

## 2022-08-25 ENCOUNTER — Ambulatory Visit: Payer: Self-pay | Admitting: Orthopedic Surgery

## 2022-08-25 DIAGNOSIS — Z01818 Encounter for other preprocedural examination: Secondary | ICD-10-CM

## 2022-08-25 NOTE — Progress Notes (Signed)
Sent message, via epic in basket, requesting orders in epic from surgeon.  

## 2022-08-26 NOTE — Patient Instructions (Signed)
DUE TO COVID-19 ONLY TWO VISITORS  (aged 66 and older)  ARE ALLOWED TO COME WITH YOU AND STAY IN THE WAITING ROOM ONLY DURING PRE OP AND PROCEDURE.   **NO VISITORS ARE ALLOWED IN THE SHORT STAY AREA OR RECOVERY ROOM!!**  IF YOU WILL BE ADMITTED INTO THE HOSPITAL YOU ARE ALLOWED ONLY FOUR SUPPORT PEOPLE DURING VISITATION HOURS ONLY (7 AM -8PM)   The support person(s) must pass our screening, gel in and out, and wear a mask at all times, including in the patient's room. Patients must also wear a mask when staff or their support person are in the room. Visitors GUEST BADGE MUST BE WORN VISIBLY  One adult visitor may remain with you overnight and MUST be in the room by 8 P.M.     Your procedure is scheduled on: 09/02/22   Report to [facility] Main Entrance    Report to admitting at 5:15 AM   Call this number if you have problems the morning of surgery [phone]   Do not eat food :After Midnight.   After Midnight you may have the following liquids until _4:30_____ AM/ DAY OF SURGERY  Water Black Coffee (sugar ok, NO MILK/CREAM OR CREAMERS)  Tea (sugar ok, NO MILK/CREAM OR CREAMERS) regular and decaf                             Plain Jell-O (NO RED)                                           Fruit ices (not with fruit pulp, NO RED)                                     Popsicles (NO RED)                                                                  Juice: apple, WHITE grape, WHITE cranberry Sports drinks like Gatorade (NO RED)                  The day of surgery:  Drink ONE  G2 at 4:15 AM the morning of surgery. Drink in one sitting. Do not sip.  This drink was given to you during your hospital  pre-op appointment visit. Nothing else to drink after completing the   G2. At 4:30 AM          If you have questions, please contact your surgeon's office.     Oral Hygiene is also important to reduce your risk of infection.                                    Remember -  BRUSH YOUR TEETH THE MORNING OF SURGERY WITH YOUR REGULAR TOOTHPASTE   Do NOT smoke after Midnight   Take these medicines the morning of surgery with A SIP OF WATER: Zetia-Ezetimibe  Finasteride-Proscar                                                                                                                           Silodosin-Rapaflo                                                                                                                            Omeprazole-Prilosec  DO NOT TAKE ANY ORAL DIABETIC MEDICATIONS DAY OF YOUR SURGERY  Bring CPAP mask and tubing day of surgery.                              You may not have any metal on your body including  jewelry, and body piercing             Do not wear  lotions, powders, cologne, or deodorant                Men may shave face and neck.   Do not bring valuables to the hospital. [place].   Contacts, dentures or bridgework may not be worn into surgery.   Bring small overnight bag day of surgery.   DO NOT [place]. PHARMACY WILL DISPENSE MEDICATIONS LISTED ON YOUR MEDICATION LIST TO YOU DURING YOUR ADMISSION [place]!     Special Instructions: Bring a copy of your healthcare power of attorney and living will documents  the day of surgery if you haven't scanned them before.              Please read over the following fact sheets you were given: IF YOU HAVE QUESTIONS ABOUT YOUR PRE-OP INSTRUCTIONS PLEASE CALL [phone]    [facility] - Preparing for Surgery Before surgery, you can play an important role.  Because skin is not sterile, your skin needs to be as free of germs as possible.  You can reduce the number of germs on your skin by washing with CHG (chlorahexidine gluconate) soap before surgery.  CHG is an  antiseptic cleaner which kills germs and bonds with the skin to continue killing germs even after washing. Please DO NOT use if you have an allergy to CHG or antibacterial soaps.  If your skin becomes reddened/irritated stop using the CHG and inform  your nurse when you arrive at Short Stay..  You may shave your face/neck. Please follow these instructions carefully:  1.  Shower with CHG Soap the night before surgery and the  morning of Surgery.  2.  If you choose to wash your hair, wash your hair first as usual with your  normal  shampoo.  3.  After you shampoo, rinse your hair and body thoroughly to remove the  shampoo.                            4.  Use CHG as you would any other liquid soap.  You can apply chg directly  to the skin and wash                       Gently with a scrungie or clean washcloth.  5.  Apply the CHG Soap to your body ONLY FROM THE NECK DOWN.   Do not use on face/ open                           Wound or open sores. Avoid contact with eyes, ears mouth and genitals (private parts).                       Wash face,  Genitals (private parts) with your normal soap.             6.  Wash thoroughly, paying special attention to the area where your surgery  will be performed.  7.  Thoroughly rinse your body with warm water from the neck down.  8.  DO NOT shower/wash with your normal soap after using and rinsing off  the CHG Soap.                9.  Pat yourself dry with a clean towel.            10.  Wear clean pajamas.            11.  Place clean sheets on your bed the night of your first shower and do not  sleep with pets. Day of Surgery : Do not apply any lotions/deodorants the morning of surgery.  Please wear clean clothes to the hospital/surgery center.  FAILURE TO FOLLOW THESE INSTRUCTIONS MAY RESULT IN THE CANCELLATION OF YOUR SURGERY    ________________________________________________________________________  Incentive Spirometer  An incentive spirometer is a tool  that can help keep your lungs clear and active. This tool measures how well you are filling your lungs with each breath. Taking long deep breaths may help reverse or decrease the chance of developing breathing (pulmonary) problems (especially infection) following: A long period of time when you are unable to move or be active. BEFORE THE PROCEDURE  If the spirometer includes an indicator to show your best effort, your nurse or respiratory therapist will set it to a desired goal. If possible, sit up straight or lean slightly forward. Try not to slouch. Hold the incentive spirometer in an upright position. INSTRUCTIONS FOR USE  Sit on the edge of your bed if possible, or sit up as far as you can in bed or on a chair. Hold the incentive spirometer in an upright position. Breathe out normally. Place the mouthpiece in your mouth and seal your lips tightly around it. Breathe in slowly and as deeply as possible, raising the piston or  the ball toward the top of the column. Hold your breath for 3-5 seconds or for as long as possible. Allow the piston or ball to fall to the bottom of the column. Remove the mouthpiece from your mouth and breathe out normally. Rest for a few seconds and repeat Steps 1 through 7 at least 10 times every 1-2 hours when you are awake. Take your time and take a few normal breaths between deep breaths. The spirometer may include an indicator to show your best effort. Use the indicator as a goal to work toward during each repetition. After each set of 10 deep breaths, practice coughing to be sure your lungs are clear. If you have an incision (the cut made at the time of surgery), support your incision when coughing by placing a pillow or rolled up towels firmly against it. Once you are able to get out of bed, walk around indoors and cough well. You may stop using the incentive spirometer when instructed by your caregiver.  RISKS AND COMPLICATIONS Take your time so you do not get  dizzy or light-headed. If you are in pain, you may need to take or ask for pain medication before doing incentive spirometry. It is harder to take a deep breath if you are having pain. AFTER USE Rest and breathe slowly and easily. It can be helpful to keep track of a log of your progress. Your caregiver can provide you with a simple table to help with this. If you are using the spirometer at home, follow these instructions: [place] IF:  You are having difficultly using the spirometer. You have trouble using the spirometer as often as instructed. Your pain medication is not giving enough relief while using the spirometer. You develop fever of 100.5 F (38.1 C) or higher. SEEK IMMEDIATE MEDICAL CARE IF:  You cough up bloody sputum that had not been present before. You develop fever of 102 F (38.9 C) or greater. You develop worsening pain at or near the incision site. MAKE SURE YOU:  Understand these instructions. Will watch your condition. Will get help right away if you are not doing well or get worse. Document Released: 01/24/2007 Document Revised: 12/06/2011 Document Reviewed: 03/27/2007 [facility] Patient Information 2014 [place], [place].   ________________________________________________________________________

## 2022-08-27 ENCOUNTER — Encounter (HOSPITAL_COMMUNITY): Payer: Self-pay

## 2022-08-27 ENCOUNTER — Encounter (HOSPITAL_COMMUNITY)
Admission: RE | Admit: 2022-08-27 | Discharge: 2022-08-27 | Disposition: A | Discharge status: Home / Self Care | Payer: Medicare HMO | Source: Ambulatory Visit | Attending: Nurse Practitioner | Admitting: Nurse Practitioner

## 2022-08-27 DIAGNOSIS — M25562 Pain in left knee: Secondary | ICD-10-CM | POA: Diagnosis not present

## 2022-08-27 DIAGNOSIS — M1712 Unilateral primary osteoarthritis, left knee: Secondary | ICD-10-CM | POA: Diagnosis not present

## 2022-08-30 ENCOUNTER — Encounter: Payer: Self-pay | Admitting: Nurse Practitioner

## 2022-08-30 ENCOUNTER — Ambulatory Visit (INDEPENDENT_AMBULATORY_CARE_PROVIDER_SITE_OTHER): Payer: Medicare HMO | Admitting: Nurse Practitioner

## 2022-08-30 VITALS — BP 142/83 | HR 94 | Temp 98.0°F | Resp 20 | Ht 71.0 in | Wt 250.0 lb

## 2022-08-30 DIAGNOSIS — E1165 Type 2 diabetes mellitus with hyperglycemia: Secondary | ICD-10-CM | POA: Diagnosis not present

## 2022-08-30 MED ORDER — GLIPIZIDE 5 MG PO TABS: 5.0000 mg | ORAL_TABLET | Freq: Two times a day (BID) | ORAL | 3 refills | Status: DC

## 2022-08-30 MED ORDER — EMPAGLIFLOZIN 10 MG PO TABS: 10.0000 mg | ORAL_TABLET | Freq: Every day | ORAL | 0 refills | Status: DC

## 2022-08-30 NOTE — Patient Instructions (Signed)

## 2022-08-30 NOTE — Progress Notes (Signed)
   Subjective:    Patient ID: Roger Guerra, male    DOB: 27-Jul-1956, 66 y.o.   MRN: [id number]  Roger Guerra in today with chief complaint of Diabetes   1. Type 2 diabetes mellitus with hyperglycemia, without long-term current use of insulin [facility]) Patient went for his preop visit and they looked through his glucometer and had lots of elevated blood sugars . They decided to cancel his surgery and have him check with [place]. His last hgba1c was 7.2%. he says his blood sugars have been running high. He was going to the wellness center for his diabetes. He has stopped going there. He is currently on no meds. He has been on trulicity, victozia and ozempic. Metformin gives him diarrhea so he cannot tolerate that.          Review of Systems  Constitutional:  Negative for diaphoresis.  Eyes:  Negative for pain.  Respiratory:  Negative for shortness of breath.   Cardiovascular:  Negative for chest pain, palpitations and leg swelling.  Gastrointestinal:  Negative for abdominal pain.  Endocrine: Negative for polydipsia.  Skin:  Negative for rash.  Neurological:  Negative for dizziness, weakness and headaches.  Hematological:  Does not bruise/bleed easily.  All other systems reviewed and are negative.      Objective:   Physical Exam Constitutional:      Appearance: Normal appearance. He is obese.  Cardiovascular:     Rate and Rhythm: Normal rate and regular rhythm.  Pulmonary:     Breath sounds: Normal breath sounds.  Skin:    General: Skin is warm.  Neurological:     General: No focal deficit present.     Mental Status: He is alert and oriented to person, place, and time.  Psychiatric:        Mood and Affect: Mood normal.        Behavior: Behavior normal.    BP (!) 142/83   Pulse 94   Temp 98 F (36.7 C) (Temporal)   Resp 20   Ht '5\' 11"'$  (1.803 m)   Wt 250 lb (113.4 kg)   SpO2 94%   BMI 34.87 kg/m   HGBA1c 12.1       Assessment & Plan:   Roger Guerra in  today with chief complaint of Diabetes   1. Type 2 diabetes mellitus with hyperglycemia, without long-term current use of insulin (HCC) Added jardiance 1 daily- samples given Glipizide BID added Continue to keep diary of blood sugars - Bayer DCA Hb A1c Waived    The above assessment and management plan was discussed with the patient. The patient verbalized understanding of and has agreed to the management plan. Patient is aware to call the clinic if symptoms persist or worsen. Patient is aware when to return to the clinic for a follow-up visit. Patient educated on when it is appropriate to go to the emergency department.   Mary-Margaret Hassell Done, FNP

## 2022-08-31 ENCOUNTER — Other Ambulatory Visit: Payer: Self-pay | Admitting: Nurse Practitioner

## 2022-08-31 DIAGNOSIS — M5030 Other cervical disc degeneration, unspecified cervical region: Secondary | ICD-10-CM

## 2022-08-31 LAB — BAYER DCA HB A1C WAIVED: HB A1C (BAYER DCA - WAIVED): 12.1 % — ABNORMAL HIGH (ref 4.8–5.6)

## 2022-09-02 ENCOUNTER — Encounter (HOSPITAL_COMMUNITY): Admission: RE | Payer: Self-pay | Source: Home / Self Care

## 2022-09-02 ENCOUNTER — Ambulatory Visit (HOSPITAL_COMMUNITY): Admission: RE | Admit: 2022-09-02 | Payer: Medicare HMO | Source: Home / Self Care | Admitting: Specialist

## 2022-09-02 DIAGNOSIS — Z01818 Encounter for other preprocedural examination: Secondary | ICD-10-CM

## 2022-09-02 DIAGNOSIS — E1165 Type 2 diabetes mellitus with hyperglycemia: Secondary | ICD-10-CM

## 2022-09-02 SURGERY — ARTHROPLASTY, KNEE, TOTAL
Anesthesia: Spinal | Site: Knee | Laterality: Left

## 2022-09-08 ENCOUNTER — Telehealth: Payer: Self-pay | Admitting: Nurse Practitioner

## 2022-09-08 ENCOUNTER — Ambulatory Visit (INDEPENDENT_AMBULATORY_CARE_PROVIDER_SITE_OTHER): Payer: Medicare HMO | Admitting: Urology

## 2022-09-08 VITALS — BP 162/90 | Temp 102.0°F

## 2022-09-08 DIAGNOSIS — N138 Other obstructive and reflux uropathy: Secondary | ICD-10-CM | POA: Diagnosis not present

## 2022-09-08 DIAGNOSIS — N481 Balanitis: Secondary | ICD-10-CM

## 2022-09-08 DIAGNOSIS — R339 Retention of urine, unspecified: Secondary | ICD-10-CM | POA: Diagnosis not present

## 2022-09-08 DIAGNOSIS — N401 Enlarged prostate with lower urinary tract symptoms: Secondary | ICD-10-CM

## 2022-09-08 LAB — URINALYSIS, ROUTINE W REFLEX MICROSCOPIC
Bilirubin, UA: NEGATIVE
Leukocytes,UA: NEGATIVE
Nitrite, UA: NEGATIVE
RBC, UA: NEGATIVE
Specific Gravity, UA: 1.02 (ref 1.005–1.030)
Urobilinogen, Ur: 0.2 mg/dL (ref 0.2–1.0)
pH, UA: 5 (ref 5.0–7.5)

## 2022-09-08 MED ORDER — FINASTERIDE 5 MG PO TABS: 5.0000 mg | ORAL_TABLET | Freq: Every day | ORAL | 3 refills | Status: DC

## 2022-09-08 MED ORDER — CLOTRIMAZOLE-BETAMETHASONE 1-0.05 % EX CREA: 1.0000 | TOPICAL_CREAM | Freq: Two times a day (BID) | CUTANEOUS | 1 refills | Status: DC

## 2022-09-08 MED ORDER — SILODOSIN 8 MG PO CAPS: 8.0000 mg | ORAL_CAPSULE | Freq: Two times a day (BID) | ORAL | 3 refills | Status: DC

## 2022-09-08 NOTE — Telephone Encounter (Signed)
Left message for patient to call back and schedule Medicare Annual Wellness Visit (AWV) to be completed by video or phone.   Last AWV: 10/02/2021   Please schedule at anytime with [place]     45 minute appointment  Any questions, please contact me at [phone]

## 2022-09-08 NOTE — Progress Notes (Signed)
post void residual=150 

## 2022-09-08 NOTE — Progress Notes (Signed)
09/08/2022 1:42 PM   Roger Guerra [birth year] [id number]  Referring provider: Chevis Pretty, [place],  [place] [address]  Followup BPH   HPI: Mr Roger Guerra is a 45YK here for followup For BPH with nocturia and urinary frequency. IPSS 14 QOL 3 on rapaflo '8mg'$  BID and finasteride. He is doing better since last visit. He had a recent A1c of 12 after he ran out of DMII meds. His blood sugars have improved since he restarted his medications. He is currently on Jardiance until January first.    PMH: Past Medical History:  Diagnosis Date   Anxiety disorder    Arthritis    Atherosclerosis    Back pain    Constipation    Coronary artery calcification seen on CT scan    Depression    Diabetes [facility])    Erectile dysfunction    GERD (gastroesophageal reflux disease)    Heartburn    History of blood clots    History of DVT (deep vein thrombosis)    History of DVT of lower extremity    Right leg 2002    History of pneumonia    Hyperlipidemia    Hypogonadism in male    Hypopituitarism ([place])    Joint pain    Nerve root and plexus disorder    Obesity    Obstructive sleep apnea    OSA on CPAP    Osteoarthritis    Sciatica    Sleep apnea    SOBOE (shortness of breath on exertion)    Spinal stenosis    Spondylosis    Swelling of both lower extremities    Type 2 diabetes mellitus (HCC)    Vitamin D deficiency     Surgical History: Past Surgical History:  Procedure Laterality Date   BLADDER SURGERY  2015   removal of skin tag near urethra   COLONOSCOPY N/A 12/10/2016   Procedure: COLONOSCOPY;  Surgeon: Daneil Dolin, MD;  Location: [facility];  Service: Endoscopy;  Laterality: N/A;  8:30 AM   COLONOSCOPY WITH PROPOFOL N/A 04/29/2022   Procedure: COLONOSCOPY WITH PROPOFOL;  Surgeon: Daneil Dolin, MD;  Location: [facility];  Service: Endoscopy;  Laterality: N/A;  8:00am   KNEE ARTHROSCOPY WITH MEDIAL MENISECTOMY Right 08/25/2017    Procedure: Right knee arthroscopy, partial medial menisectomy and debridement;  Surgeon: Susa Day, MD;  Location: [facility] OR[facility];  Service: Orthopedics;  Laterality: Right;  60 mins   POLYPECTOMY  12/10/2016   Procedure: POLYPECTOMY;  Surgeon: Daneil Dolin, MD;  Location: [facility];  Service: Endoscopy;;  hepatic flexure   PTOSIS REPAIR Bilateral 02/2022   TONSILLECTOMY      Home Medications:  Allergies as of 09/08/2022       Reactions   Invokana [canagliflozin] Other (See Comments)   Yeast infection   Statins Other (See Comments)   Severe muscle cramps        Medication List        Accurate as of September 08, 2022  1:42 PM. If you have any questions, ask your nurse or doctor.          5-HTP PO Take 150 mg by mouth daily.   AMBULATORY NON FORMULARY MEDICATION Medication Name: Trimix  PGE 30 mcg Pap 30 mg Phent 1 mg What changed:  when to take this reasons to take this   aspirin 81 MG tablet Take 81 mg by mouth daily.   carisoprodol 350 MG tablet Commonly known as:  SOMA TAKE (1) TABLET BY MOUTH THREE TIMES DAILY AS NEEDED.   cetirizine 10 MG tablet Commonly known as: ZYRTEC Take 10 mg by mouth every evening.   co-enzyme Q-10 30 MG capsule Take 30 mg by mouth daily.   empagliflozin 10 MG Tabs tablet Commonly known as: Jardiance Take 1 tablet (10 mg total) by mouth daily before breakfast.   ezetimibe 10 MG tablet Commonly known as: ZETIA TAKE 1 TABLET BY MOUTH DAILY   fexofenadine 180 MG tablet Commonly known as: ALLEGRA Take 180 mg by mouth every morning.   finasteride 5 MG tablet Commonly known as: PROSCAR Take 1 tablet (5 mg total) by mouth daily.   Fish Oil 1000 MG Caps Take 1,000 Units by mouth daily.   glipiZIDE 5 MG tablet Commonly known as: GLUCOTROL Take 1 tablet (5 mg total) by mouth 2 (two) times daily before a meal.   glucose blood test strip Use OneTouch Ultra test strips to check blood sugar once daily. DX:E11.69    hydrocortisone cream 1 % Apply to affected area 2 times daily   ibuprofen 800 MG tablet Commonly known as: ADVIL TAKE (1) TABLET BY MOUTH IN THE MORNING AND AT BEDTIME.   Luer Lock Safety Syringes 22G X 1" 3 ML Misc Generic drug: SYRINGE-NEEDLE (DISP) 3 ML For testosterone  injections   magnesium oxide 400 (240 Mg) MG tablet Commonly known as: MAG-OX Take 400 mg by mouth daily.   Mederma Gel Apply 1 Application topically 2 (two) times daily.   multivitamin with minerals Tabs tablet Take 1 tablet by mouth daily.   naproxen sodium 220 MG tablet Commonly known as: ALEVE Take 220 mg by mouth daily as needed (pain).   omeprazole 20 MG tablet Commonly known as: PRILOSEC OTC Take 1 tablet (20 mg total) by mouth daily as needed (acid reflux).   ondansetron 4 MG tablet Commonly known as: Zofran Take 1 tablet (4 mg total) by mouth every 8 (eight) hours as needed for nausea or vomiting.   oxyCODONE-acetaminophen 5-325 MG tablet Commonly known as: PERCOCET/ROXICET Take 1 tablet by mouth 2 (two) times daily as needed for moderate pain.   Praluent 75 MG/ML Soaj Generic drug: Alirocumab Inject 1 Dose into the skin every 14 (fourteen) days.   silodosin 8 MG Caps capsule Commonly known as: RAPAFLO Take 1 capsule (8 mg total) by mouth in the morning and at bedtime.   sodium chloride 0.65 % Soln nasal spray Commonly known as: OCEAN Place 1 spray into both nostrils 2 (two) times daily.   testosterone cypionate 200 MG/ML injection Commonly known as: DEPOTESTOSTERONE CYPIONATE Inject 1 mL (200 mg total) into the muscle every 14 (fourteen) days. Give 1 ml every 10 days What changed: additional instructions   VITAMIN D3 PO Take 1 tablet by mouth daily.   vitamin E 180 MG (400 UNITS) capsule Take 400 Units by mouth daily.   zinc gluconate 50 MG tablet Take 50 mg by mouth daily.        Allergies:  Allergies  Allergen Reactions   Invokana [Canagliflozin] Other (See  Comments)    Yeast infection   Statins Other (See Comments)    Severe muscle cramps    Family History: Family History  Problem Relation Age of Onset   Diabetes Mother    Lung cancer Father    Heart disease Father    Stroke Father    Cancer Father    Diabetes Sister    Diabetes Other  Siblings    Social History:  reports that he has quit smoking. His smoking use included cigarettes. He has a 37.50 pack-year smoking history. He has never used smokeless tobacco. He reports that he does not drink alcohol and does not use drugs.  ROS: All other review of systems were reviewed and are negative except what is noted above in HPI  Physical Exam: BP (!) 162/90   Temp (!) 102 F (38.9 C)   Constitutional:  Alert and oriented, No acute distress. HEENT: [place] AT, moist mucus membranes.  Trachea midline, no masses. Cardiovascular: No clubbing, cyanosis, or edema. Respiratory: Normal respiratory effort, no increased work of breathing. GI: Abdomen is soft, nontender, nondistended, no abdominal masses GU: No CVA tenderness.  Lymph: No cervical or inguinal lymphadenopathy. Skin: No rashes, bruises or suspicious lesions. Neurologic: Grossly intact, no focal deficits, moving all 4 extremities. Psychiatric: Normal mood and affect.  Laboratory Data: Lab Results  Component Value Date   WBC 8.3 05/11/2022   HGB 17.0 05/11/2022   HCT 49.1 05/11/2022   MCV 88 05/11/2022   PLT 279 05/11/2022    Lab Results  Component Value Date   CREATININE 0.94 05/11/2022    No results found for: "PSA"  Lab Results  Component Value Date   TESTOSTERONE 592 06/16/2021    Lab Results  Component Value Date   HGBA1C 12.1 (H) 08/30/2022    Urinalysis    Component Value Date/Time   APPEARANCEUR Clear 06/21/2022 1318   GLUCOSEU 3+ (A) 06/21/2022 1318   BILIRUBINUR Negative 06/21/2022 1318   PROTEINUR Negative 06/21/2022 1318   NITRITE Negative 06/21/2022 1318   LEUKOCYTESUR Negative  06/21/2022 1318    Lab Results  Component Value Date   LABMICR Comment 06/21/2022    Pertinent Imaging:  No results found for this or any previous visit.  No results found for this or any previous visit.  No results found for this or any previous visit.  No results found for this or any previous visit.  No results found for this or any previous visit.  No valid procedures specified. No results found for this or any previous visit.  No results found for this or any previous visit.   Assessment & Plan:    1. Incomplete bladder emptying -rapaflo '8mg'$  and finasteride '5mg'$  - Urinalysis, Routine w reflex microscopic  2. Benign prostatic hyperplasia with urinary obstruction Rapaflo '8mg'$  and finsteride '5mg'$  - BLADDER SCAN AMB NON-IMAGING  3. Balanitis -clotrimazole BID   No follow-ups on file.  Nicolette Bang, MD  [facility] [place]

## 2022-09-14 ENCOUNTER — Telehealth: Payer: Self-pay | Admitting: Nurse Practitioner

## 2022-09-14 DIAGNOSIS — G4733 Obstructive sleep apnea (adult) (pediatric): Secondary | ICD-10-CM

## 2022-09-14 NOTE — Telephone Encounter (Signed)
Waiting on signed order from provider

## 2022-09-16 ENCOUNTER — Encounter: Payer: Self-pay | Admitting: Urology

## 2022-09-16 NOTE — Patient Instructions (Signed)

## 2022-09-16 NOTE — Telephone Encounter (Signed)
LMOVM order faxed to [facility] [phone]

## 2022-09-23 ENCOUNTER — Telehealth: Payer: Self-pay | Admitting: Nurse Practitioner

## 2022-09-23 DIAGNOSIS — G4733 Obstructive sleep apnea (adult) (pediatric): Secondary | ICD-10-CM | POA: Diagnosis not present

## 2022-09-23 NOTE — Telephone Encounter (Signed)
Needs a urine specimen- schedule for video visit tomorrow if nit face to face visit

## 2022-09-23 NOTE — Telephone Encounter (Signed)
Patient notified and Mychart virtual visit scheduled with DOD per MMM. Will bring in urine in the AM

## 2022-09-23 NOTE — Telephone Encounter (Signed)
Pt says he has been taking Jardiance for a few weeks and believes he has developed a UTI since starting the medicine. Says he had the same reaction when he started Invokana in the past. Pt says he is also currently have pain right plank. Wants to know if [facility] can send him in medicine for UTI.   Pt uses [facility].

## 2022-09-24 ENCOUNTER — Encounter: Payer: Self-pay | Admitting: Family Medicine

## 2022-09-24 ENCOUNTER — Telehealth (INDEPENDENT_AMBULATORY_CARE_PROVIDER_SITE_OTHER): Payer: Medicare HMO | Admitting: Family Medicine

## 2022-09-24 DIAGNOSIS — R300 Dysuria: Secondary | ICD-10-CM

## 2022-09-24 DIAGNOSIS — R399 Unspecified symptoms and signs involving the genitourinary system: Secondary | ICD-10-CM | POA: Diagnosis not present

## 2022-09-24 DIAGNOSIS — B379 Candidiasis, unspecified: Secondary | ICD-10-CM | POA: Diagnosis not present

## 2022-09-24 LAB — URINALYSIS
Bilirubin, UA: NEGATIVE
Leukocytes,UA: NEGATIVE
Nitrite, UA: NEGATIVE
RBC, UA: NEGATIVE
Specific Gravity, UA: 1.025 (ref 1.005–1.030)
Urobilinogen, Ur: 0.2 mg/dL (ref 0.2–1.0)
pH, UA: 5 (ref 5.0–7.5)

## 2022-09-24 MED ORDER — FLUCONAZOLE 150 MG PO TABS: ORAL_TABLET | ORAL | 0 refills | Status: DC

## 2022-09-24 NOTE — Progress Notes (Signed)
Virtual Visit  Note Due to COVID-19 pandemic this visit was conducted virtually. This visit type was conducted due to national recommendations for restrictions regarding the COVID-19 Pandemic (e.g. social distancing, sheltering in place) in an effort to limit this patient's exposure and mitigate transmission in our community. All issues noted in this document were discussed and addressed.  A physical exam was not performed with this format.  I connected with Roger Guerra on 09/24/22 at 1012 by telephone and verified that I am speaking with the correct person using two identifiers. Roger Guerra is currently located in his car and no one is currently with him during the visit. The provider, Gwenlyn Perking, FNP is located in their office at time of visit.  I discussed the limitations, risks, security and privacy concerns of performing an evaluation and management service by telephone and the availability of in person appointments. I also discussed with the patient that there may be a patient responsible charge related to this service. The patient expressed understanding and agreed to proceed.  CC: urinary odor  History and Present Illness:  HPI Roger Guerra was unable to connect via video, so visit was done via telephone audio only. Roger Guerra reports urinary odor with right sided flank pain since yesterday. He also has dysuria, frequency, and urgency. Frequency and urgency are chronic issues however. He has redness around the top of his penis. He denies swelling, pain discoloration, or discharge. Denies fever or hematuria. He has been using a steroid cream that his urologist has given him with improvement of the redness. He recently start on jardiance for uncontrolled DM. He was previously on invokanna years ago but had to discontinue it due to yeast infections and UTIs.     ROS As per HPI.   Observations/Objective: Alert and oriented x 3. Able to speak in full sentences without difficulty.    Urine dipstick shows negative for all components, positive for glucose, protein, ketones.   Assessment and Plan: Roger Guerra was seen today for urinary odor.  Diagnoses and all orders for this visit:  Dysuria UA does not indicate UTI today. Culture pending. Discussed likely due to yeast.  -     Urine Culture -     Urinalysis  Yeast infection Diflucan as below. Stop jardiance until follow up with PCP next week. Denies swelling, pain, discoloration, discharge. Strict return precautions given.  -     fluconazole (DIFLUCAN) 150 MG tablet; Take one tablet by mouth now. Repeat in 3 days.   Follow Up Instructions: Return to office for new or worsening symptoms, or if symptoms persist.     I discussed the assessment and treatment plan with the patient. The patient was provided an opportunity to ask questions and all were answered. The patient agreed with the plan and demonstrated an understanding of the instructions.   The patient was advised to call back or seek an in-person evaluation if the symptoms worsen or if the condition fails to improve as anticipated.  The above assessment and management plan was discussed with the patient. The patient verbalized understanding of and has agreed to the management plan. Patient is aware to call the clinic if symptoms persist or worsen. Patient is aware when to return to the clinic for a follow-up visit. Patient educated on when it is appropriate to go to the emergency department.   Time call ended:  1024  I provided 12 minutes of  non face-to-face time during this encounter.    Gwenlyn Perking, FNP

## 2022-09-26 LAB — URINE CULTURE

## 2022-09-29 ENCOUNTER — Encounter: Payer: Self-pay | Admitting: Pharmacist

## 2022-09-29 DIAGNOSIS — G720 Drug-induced myopathy: Secondary | ICD-10-CM

## 2022-09-29 NOTE — Progress Notes (Signed)
[place] [facility])                                            [place] Team                                        Statin Quality Measure Assessment    09/29/2022  Roger Guerra 18-May-1956 [id number]  Per review of chart and payor information, patient has a diagnosis of diabetes but is not currently filling a statin prescription.  This places patient into the SUPD (Statin Use In Patients with Diabetes) measure for CMS.    Patient has documented trials of statins  with reported muscle pain , but no corresponding CPT codes that would exclude patient from SUPD measure. (For the current benefit year)  He has an upcoming follow up appointment on 09/30/22, if deemed therapeutically appropriate, one of the listed statin exclusion codes could be associated with the visit. (This would remove the patient from the measure).  Patient is on ezetimibe.  The ASCVD Risk score (Arnett DK, et al., 2019) failed to calculate for the following reasons:   The valid total cholesterol range is 130 to 320 mg/dL 05/11/2022     Component Value Date/Time   CHOL 99 (L) 05/11/2022 1018   TRIG 81 05/11/2022 1018   HDL 34 (L) 05/11/2022 1018   CHOLHDL 2.9 05/11/2022 1018   LDLCALC 48 05/11/2022 1018    Please consider ONE of the following recommendations:  Initiate high intensity statin Atorvastatin '40mg'$  once daily, #90, 3 refills   Rosuvastatin '20mg'$  once daily, #90, 3 refills    Initiate moderate intensity          statin with reduced frequency if prior          statin intolerance 1x weekly, #13, 3 refills   2x weekly, #26, 3 refills   3x weekly, #39, 3 refills    Code for past statin intolerance or  other exclusions (required annually)   Provider Requirements:  Associate code during an office visit or telehealth encounter  Drug Induced Myopathy G72.0   Myopathy, unspecified G72.9   Myositis, unspecified M60.9   Rhabdomyolysis I94.85    Alcoholic fatty liver I62.7   Cirrhosis of liver K74.69   Prediabetes R73.03   PCOS E28.2   Toxic liver disease, unspecified K71.9        Plan:  Route note to PCP prior to the upcoming appointment.   Elayne Guerin, PharmD, [place] Clinical Pharmacist [phone]

## 2022-09-30 ENCOUNTER — Encounter: Payer: Self-pay | Admitting: Nurse Practitioner

## 2022-09-30 ENCOUNTER — Ambulatory Visit (INDEPENDENT_AMBULATORY_CARE_PROVIDER_SITE_OTHER): Payer: Medicare HMO | Admitting: Nurse Practitioner

## 2022-09-30 VITALS — BP 126/81 | HR 75 | Temp 97.9°F | Resp 20 | Ht 71.0 in | Wt 256.0 lb

## 2022-09-30 DIAGNOSIS — E1165 Type 2 diabetes mellitus with hyperglycemia: Secondary | ICD-10-CM | POA: Diagnosis not present

## 2022-09-30 DIAGNOSIS — Z7984 Long term (current) use of oral hypoglycemic drugs: Secondary | ICD-10-CM

## 2022-09-30 LAB — BAYER DCA HB A1C WAIVED: HB A1C (BAYER DCA - WAIVED): 9.7 % — ABNORMAL HIGH (ref 4.8–5.6)

## 2022-09-30 MED ORDER — GLUCOSE BLOOD VI STRP: ORAL_STRIP | 1 refills | Status: DC

## 2022-09-30 MED ORDER — RYBELSUS 3 MG PO TABS: 3.0000 mg | ORAL_TABLET | Freq: Every day | ORAL | 0 refills | Status: DC

## 2022-09-30 NOTE — Progress Notes (Signed)
Subjective:    Patient ID: Roger Guerra, male    DOB: 1956-05-14, 67 y.o.   MRN: [id number]   Chief Complaint: diabetes  HPI Patient was suppose to have a total knee replacement but his HGBA1c was to elevated. He had stopped taking his medication. We started him on jardiance and glipizide a month ago and he is here to have HGBA1c checked to make sure it is improving. His fasting blood sugars since starting medication have been running around avg 161 the past 30 days. He has really been trying to watch his diet but has been doing no exercise. He stopped his janumet because of back pain. He has not had in a week. He had the same reaction with invokana.  Lab Results  Component Value Date   HGBA1C 12.1 (H) 08/30/2022       Review of Systems  Constitutional:  Negative for diaphoresis.  Eyes:  Negative for pain.  Respiratory:  Negative for shortness of breath.   Cardiovascular:  Negative for chest pain, palpitations and leg swelling.  Gastrointestinal:  Negative for abdominal pain.  Endocrine: Negative for polydipsia.  Skin:  Negative for rash.  Neurological:  Negative for dizziness, weakness and headaches.  Hematological:  Does not bruise/bleed easily.  All other systems reviewed and are negative.      Objective:   Physical Exam Vitals and nursing note reviewed.  Constitutional:      Appearance: Normal appearance. He is well-developed.  Neck:     Thyroid: No thyroid mass or thyromegaly.     Vascular: No carotid bruit or JVD.     Trachea: Phonation normal.  Cardiovascular:     Rate and Rhythm: Normal rate and regular rhythm.  Pulmonary:     Effort: Pulmonary effort is normal. No respiratory distress.     Breath sounds: Normal breath sounds.  Abdominal:     General: Bowel sounds are normal.     Palpations: Abdomen is soft.     Tenderness: There is no abdominal tenderness.  Musculoskeletal:        General: Normal range of motion.     Cervical back: Normal range of  motion and neck supple.  Lymphadenopathy:     Cervical: No cervical adenopathy.  Skin:    General: Skin is warm and dry.  Neurological:     Mental Status: He is alert and oriented to person, place, and time.  Psychiatric:        Behavior: Behavior normal.        Thought Content: Thought content normal.        Judgment: Judgment normal.   BP 126/81   Pulse 75   Temp 97.9 F (36.6 C) (Temporal)   Resp 20   Ht '5\' 11"'$  (1.803 m)   Wt 256 lb (116.1 kg)   SpO2 94%   BMI 35.70 kg/m    Hgba1c 9.7%      Assessment & Plan:   [name] in today with chief complaint of Diabetes   1. Type 2 diabetes mellitus with hyperglycemia, without long-term current use of insulin (HCC) Added rybelsus '3mg'$  1 po daily - carb counting Bayer DCA Hb A1c Waived - Semaglutide (RYBELSUS) 3 MG TABS; Take 3 mg by mouth daily.  Dispense: 30 tablet; Refill: 0    The above assessment and management plan was discussed with the patient. The patient verbalized understanding of and has agreed to the management plan. Patient is aware to call the clinic if symptoms persist or worsen.  Patient is aware when to return to the clinic for a follow-up visit. Patient educated on when it is appropriate to go to the emergency department.   Mary-Margaret Hassell Done, FNP

## 2022-10-05 NOTE — Telephone Encounter (Signed)
Ok guie it anoth rweek

## 2022-10-06 ENCOUNTER — Telehealth: Payer: Self-pay | Admitting: Internal Medicine

## 2022-10-06 MED ORDER — REPATHA SURECLICK 140 MG/ML ~~LOC~~ SOAJ: 1.0000 mL | SUBCUTANEOUS | 1 refills | Status: DC

## 2022-10-06 NOTE — Telephone Encounter (Signed)
Patient has [employer] which changed formulary from [place] for 2024. He is on Praluent '75mg'$ /mL. Last LDL was 48 (August 2023)  Routed to MD to advise on change

## 2022-10-06 NOTE — Telephone Encounter (Signed)
Received prior auth request for Repatha. This has been submitted, Key: [id number], and approved through 09/27/23.

## 2022-10-06 NOTE — Telephone Encounter (Signed)
Per MD, OK for med change.  Rx(s) sent to pharmacy electronically. Update sent to patient in [place]

## 2022-10-11 ENCOUNTER — Other Ambulatory Visit: Payer: Self-pay | Admitting: Pharmacist

## 2022-10-11 MED ORDER — REPATHA SURECLICK 140 MG/ML ~~LOC~~ SOAJ: 1.0000 mL | SUBCUTANEOUS | 1 refills | Status: DC

## 2022-10-12 ENCOUNTER — Other Ambulatory Visit: Payer: Self-pay | Admitting: *Deleted

## 2022-10-12 DIAGNOSIS — M5030 Other cervical disc degeneration, unspecified cervical region: Secondary | ICD-10-CM

## 2022-10-12 DIAGNOSIS — K219 Gastro-esophageal reflux disease without esophagitis: Secondary | ICD-10-CM

## 2022-10-12 DIAGNOSIS — E1165 Type 2 diabetes mellitus with hyperglycemia: Secondary | ICD-10-CM

## 2022-10-12 DIAGNOSIS — E1169 Type 2 diabetes mellitus with other specified complication: Secondary | ICD-10-CM

## 2022-10-12 MED ORDER — ONDANSETRON HCL 4 MG PO TABS: 4.0000 mg | ORAL_TABLET | Freq: Three times a day (TID) | ORAL | 0 refills | Status: DC | PRN

## 2022-10-12 MED ORDER — IBUPROFEN 800 MG PO TABS: ORAL_TABLET | ORAL | 0 refills | Status: DC

## 2022-10-12 MED ORDER — GLIPIZIDE 5 MG PO TABS: 5.0000 mg | ORAL_TABLET | Freq: Two times a day (BID) | ORAL | 0 refills | Status: DC

## 2022-10-12 MED ORDER — EZETIMIBE 10 MG PO TABS: 10.0000 mg | ORAL_TABLET | Freq: Every day | ORAL | 0 refills | Status: DC

## 2022-10-12 MED ORDER — ONETOUCH ULTRA VI STRP: ORAL_STRIP | 3 refills | Status: AC

## 2022-10-12 MED ORDER — TESTOSTERONE CYPIONATE 200 MG/ML IM SOLN: 200.0000 mg | INTRAMUSCULAR | 3 refills | Status: DC

## 2022-10-12 MED ORDER — OMEPRAZOLE MAGNESIUM 20 MG PO TBEC: 20.0000 mg | DELAYED_RELEASE_TABLET | Freq: Every day | ORAL | 0 refills | Status: DC | PRN

## 2022-10-12 MED ORDER — RYBELSUS 3 MG PO TABS: 3.0000 mg | ORAL_TABLET | Freq: Every day | ORAL | 0 refills | Status: DC

## 2022-10-12 NOTE — Telephone Encounter (Signed)
Fax from [facility] requesting refills on 12 medications. A couple denied d/t prescriber not at our office, managed by Cardiology or Urology.  I denied Soma - controlled med, needs to be refilled during visit.  PCP please address the following: Last OV 09/30/22 no Dx Last RF Testosterone 02/18/22, please verify directions Ibuprofen 800 mg 09/01/22 Ondansetron 09/01/22 by Jearld Lesch DO at [facility] Next OV 10/28/22

## 2022-10-14 MED ORDER — GLIPIZIDE 10 MG PO TABS: 10.0000 mg | ORAL_TABLET | Freq: Two times a day (BID) | ORAL | 3 refills | Status: DC

## 2022-10-18 ENCOUNTER — Telehealth: Payer: Self-pay | Admitting: Nurse Practitioner

## 2022-10-18 NOTE — Telephone Encounter (Signed)
Paient has refused statin in past- will he consider it n ow

## 2022-10-18 NOTE — Telephone Encounter (Signed)
Please review

## 2022-10-19 ENCOUNTER — Other Ambulatory Visit: Payer: Self-pay | Admitting: *Deleted

## 2022-10-19 ENCOUNTER — Telehealth: Payer: Self-pay | Admitting: Nurse Practitioner

## 2022-10-19 NOTE — Telephone Encounter (Signed)
Calling for clarification on a medication    Ref # [id number]

## 2022-10-19 NOTE — Telephone Encounter (Signed)
Clarification for testosterone Rx, 2 sigs present. Gave the correct sig of inject once every 14 days.

## 2022-10-21 ENCOUNTER — Encounter: Payer: Self-pay | Admitting: Physical Therapy

## 2022-10-21 ENCOUNTER — Other Ambulatory Visit: Payer: Self-pay

## 2022-10-21 ENCOUNTER — Ambulatory Visit: Payer: Medicare HMO | Attending: Orthopedic Surgery | Admitting: Physical Therapy

## 2022-10-21 DIAGNOSIS — M25562 Pain in left knee: Secondary | ICD-10-CM | POA: Insufficient documentation

## 2022-10-21 NOTE — Therapy (Addendum)
OUTPATIENT PHYSICAL THERAPY LOWER EXTREMITY EVALUATION   Patient Name: Roger Guerra MRN: [id number] DOB:11/21/1955, 67 y.o., male Today's Date: 10/21/2022  END OF SESSION:  PT End of Session - 10/21/22 1436     Visit Number 1    Number of Visits 8    Date for PT Re-Evaluation 11/18/22    Authorization Type FOTO.    PT Start Time 0145    PT Stop Time 0234    PT Time Calculation (min) 49 min    Activity Tolerance Patient tolerated treatment well    Behavior During Therapy WFL for tasks assessed/performed             Past Medical History:  Diagnosis Date   Anxiety disorder    Arthritis    Atherosclerosis    Back pain    Constipation    Coronary artery calcification seen on CT scan    Depression    Diabetes (HCC)    Erectile dysfunction    GERD (gastroesophageal reflux disease)    Heartburn    History of blood clots    History of DVT (deep vein thrombosis)    History of DVT of lower extremity    Right leg 2002    History of pneumonia    Hyperlipidemia    Hypogonadism in male    Hypopituitarism ([place])    Joint pain    Nerve root and plexus disorder    Obesity    Obstructive sleep apnea    OSA on CPAP    Osteoarthritis    Sciatica    Sleep apnea    SOBOE (shortness of breath on exertion)    Spinal stenosis    Spondylosis    Swelling of both lower extremities    Type 2 diabetes mellitus ([place])    Vitamin D deficiency    Past Surgical History:  Procedure Laterality Date   BLADDER SURGERY  2015   removal of skin tag near urethra   COLONOSCOPY N/A 12/10/2016   Procedure: COLONOSCOPY;  Surgeon: Daneil Dolin, MD;  Location: [facility];  Service: Endoscopy;  Laterality: N/A;  8:30 AM   COLONOSCOPY WITH PROPOFOL N/A 04/29/2022   Procedure: COLONOSCOPY WITH PROPOFOL;  Surgeon: Daneil Dolin, MD;  Location: [facility];  Service: Endoscopy;  Laterality: N/A;  8:00am   KNEE ARTHROSCOPY WITH MEDIAL MENISECTOMY Right 08/25/2017   Procedure: Right knee  arthroscopy, partial medial menisectomy and debridement;  Surgeon: Susa Day, MD;  Location: [facility];  Service: Orthopedics;  Laterality: Right;  60 mins   POLYPECTOMY  12/10/2016   Procedure: POLYPECTOMY;  Surgeon: Daneil Dolin, MD;  Location: [facility];  Service: Endoscopy;;  hepatic flexure   PTOSIS REPAIR Bilateral 02/2022   TONSILLECTOMY     Patient Active Problem List   Diagnosis Date Noted   Type 2 diabetes mellitus with hyperglycemia, without long-term current use of insulin ([place]) 05/10/2022   Cervical radiculopathy 03/01/2022   DDD (degenerative disc disease), cervical 03/01/2022   Gastroesophageal reflux disease 10/27/2021   Class 2 severe obesity with serious comorbidity and body mass index (BMI) of 36.0 to 36.9 in adult [facility]) 08/12/2021   Hypopituitarism ([place]) 05/05/2021   Hyperlipidemia associated with type 2 diabetes mellitus ([place]) 03/03/2021   Vitamin D deficiency 03/03/2021   Depression 03/03/2021   OSA on CPAP 02/17/2021   At risk for heart disease 02/17/2021   Bilateral carpal tunnel syndrome 11/04/2020   Male hypogonadism 07/24/2015    REFERRING PROVIDER: Lacie Draft PA-C  REFERRING DIAG: OA of left knee.  THERAPY DIAG:  Acute pain of left knee  Rationale for Evaluation and Treatment: Rehabilitation  ONSET DATE: October 2023.  SUBJECTIVE:   SUBJECTIVE STATEMENT: The patient presents to the clinic with c/o left knee pain that began in October of last year.  His pain-level today is a 5/10 but can rise to high levels with increased activity.  Meds and sitting down decrease his pain.  His pain is rated at a 5/10 today and described as an ache and sore.    PERTINENT HISTORY: H/o back pain and right knee pain. PAIN:  Are you having pain? Yes: NPRS scale: 5/10 Pain location: Left knee. Pain description: As above. Aggravating factors: As above. Relieving factors: As above.  PRECAUTIONS: Other: Therex that does not increase pain.  WEIGHT  BEARING RESTRICTIONS: No  FALLS:  Has patient fallen in last 6 months? No  LIVING ENVIRONMENT: Lives with: lives with their spouse Lives in: House/apartment Stairs: Perform in non-reciprocating fashion. Has following equipment at home: None  OCCUPATION: Retired.    PLOF: Independent  PATIENT GOALS: Get knee replaced.  NEXT MD VISIT:   OBJECTIVE:    PATIENT SURVEYS:  FOTO 50.  EDEMA:  Circumferential: No left knee edema.  PALPATION: Tender to palpation over left knee medial joint line.  LOWER EXTREMITY ROM: Full active left knee range of motion.  LOWER EXTREMITY MMT: Normal left knee strength.  LOWER EXTREMITY SPECIAL TESTS:  Stable left knee joint. GAIT: WNL.  TODAY'S TREATMENT:                                                                                                                              DATE: Vasopneumatic and IFC at 80-150 Hx on 40% scan x 20 minutes to patient's left knee medial joint line.  Patient tolerated treatment without complaint with normal modality response following removal of modality.     PATIENT EDUCATION:  Education details: Discussed in detail designing an exercise program that does not increase his pain and avoid activities that increase pain. Person educated: Patient Education method: Explanation  ASSESSMENT:  CLINICAL IMPRESSION: The patient presents to [facility] with c/o left knee pain that really began to bother him in October of 2023.  He is full active range of motion and normal strength with good quadriceps development.  His knee is stable.  No edema.  He has medial joint line tenderness.  His pain-level rises to high levels with increased activities. He hopes to have a knee replacement in the near future.    OBJECTIVE IMPAIRMENTS: decreased activity tolerance and pain.   ACTIVITY LIMITATIONS: bending and squatting  PARTICIPATION LIMITATIONS: yard work  [employer] POTENTIAL: Good  CLINICAL DECISION MAKING:  Stable/uncomplicated  EVALUATION COMPLEXITY: Low   GOALS: LONG TERM GOALS: Target date: 11/18/22.  Ind with a HEP. Goal status: INITIAL  2.  Perform ADL's with pain not > 4/10. Goal status: INITIAL  3.  Perform a  reciprocating stair gait with one railing and pain not > 4/10. Goal status: INITIAL  PLAN:  PT FREQUENCY: 2x/week  PT DURATION: 4 weeks  PLANNED INTERVENTIONS: Therapeutic exercises, Therapeutic activity, Gait training, Patient/Family education, Self Care, Electrical stimulation, Cryotherapy, Moist heat, Vasopneumatic device, and Manual therapy, Iontophoresis 4 mg/ml  PLAN FOR NEXT SESSION: O and CKC that does increase knee pain.  Recumbent bike.  Modalities as needed.   Shulamis Wenberg, [place], PT 10/21/2022, 2:59 PM

## 2022-10-24 NOTE — Patient Instructions (Signed)
Roger Guerra , Thank you for taking time to come for your Medicare Wellness Visit. I appreciate your ongoing commitment to your health goals. Please review the following plan we discussed and let me know if I can assist you in the future.   These are the goals we discussed:  Goals      DIET - EAT MORE FRUITS AND VEGETABLES     Exercise 150 min/wk Moderate Activity        This is a list of the screening recommended for you and due dates:  Health Maintenance  Topic Date Due   Pneumonia Vaccine (1 - PCV) Never done   Hepatitis C Screening: USPSTF Recommendation to screen - Ages 53-79 yo.  Never done   Screening for Lung Cancer  02/06/2022   Complete foot exam   05/05/2022   COVID-19 Vaccine (4 - 2023-24 season) 05/28/2022   Medicare Annual Wellness Visit  10/02/2022   Eye exam for diabetics  11/11/2022   Hemoglobin A1C  03/31/2023   Yearly kidney function blood test for diabetes  05/12/2023   Yearly kidney health urinalysis for diabetes  05/12/2023   DTaP/Tdap/Td vaccine (2 - Td or Tdap) 05/06/2031   Colon Cancer Screening  04/29/2032   Flu Shot  Completed   Zoster (Shingles) Vaccine  Completed   HPV Vaccine  Aged Out    Advanced directives: Forms are available if you choose in the future to pursue completion.  This is recommended in order to make sure that your health wishes are honored in the event that you are unable to verbalize them to the provider.    Conditions/risks identified: Aim for 30 minutes of exercise or brisk walking, 6-8 glasses of water, and 5 servings of fruits and vegetables each day.   Next appointment: Follow up in one year for your annual wellness visit.   Preventive Care 8 Years and Older, Male  Preventive care refers to lifestyle choices and visits with your health care provider that can promote health and wellness. What does preventive care include? A yearly physical exam. This is also called an annual well check. Dental exams once or twice a  year. Routine eye exams. Ask your health care provider how often you should have your eyes checked. Personal lifestyle choices, including: Daily care of your teeth and gums. Regular physical activity. Eating a healthy diet. Avoiding tobacco and drug use. Limiting alcohol use. Practicing safe sex. Taking low doses of aspirin every day. Taking vitamin and mineral supplements as recommended by your health care provider. What happens during an annual well check? The services and screenings done by your health care provider during your annual well check will depend on your age, overall health, lifestyle risk factors, and family history of disease. Counseling  Your health care provider may ask you questions about your: Alcohol use. Tobacco use. Drug use. Emotional well-being. Home and relationship well-being. Sexual activity. Eating habits. History of falls. Memory and ability to understand (cognition). Work and work Statistician. Screening  You may have the following tests or measurements: Height, weight, and BMI. Blood pressure. Lipid and cholesterol levels. These may be checked every 5 years, or more frequently if you are over 76 years old. Skin check. Lung cancer screening. You may have this screening every year starting at age 5 if you have a 30-pack-year history of smoking and currently smoke or have quit within the past 15 years. Fecal occult blood test (FOBT) of the stool. You may have this test every year  starting at age 2. Flexible sigmoidoscopy or colonoscopy. You may have a sigmoidoscopy every 5 years or a colonoscopy every 10 years starting at age 54. Prostate cancer screening. Recommendations will vary depending on your family history and other risks. Hepatitis C blood test. Hepatitis B blood test. Sexually transmitted disease (STD) testing. Diabetes screening. This is done by checking your blood sugar (glucose) after you have not eaten for a while (fasting). You may  have this done every 1-3 years. Abdominal aortic aneurysm (AAA) screening. You may need this if you are a current or former smoker. Osteoporosis. You may be screened starting at age 58 if you are at high risk. Talk with your health care provider about your test results, treatment options, and if necessary, the need for more tests. Vaccines  Your health care provider may recommend certain vaccines, such as: Influenza vaccine. This is recommended every year. Tetanus, diphtheria, and acellular pertussis (Tdap, Td) vaccine. You may need a Td booster every 10 years. Zoster vaccine. You may need this after age 79. Pneumococcal 13-valent conjugate (PCV13) vaccine. One dose is recommended after age 61. Pneumococcal polysaccharide (PPSV23) vaccine. One dose is recommended after age 43. Talk to your health care provider about which screenings and vaccines you need and how often you need them. This information is not intended to replace advice given to you by your health care provider. Make sure you discuss any questions you have with your health care provider. Document Released: 10/10/2015 Document Revised: 06/02/2016 Document Reviewed: 07/15/2015 Elsevier Interactive Patient Education  2017 [place] Prevention in the Home Falls can cause injuries. They can happen to people of all ages. There are many things you can do to make your home safe and to help prevent falls. What can I do on the outside of my home? Regularly fix the edges of walkways and driveways and fix any cracks. Remove anything that might make you trip as you walk through a door, such as a raised step or threshold. Trim any bushes or trees on the path to your home. Use bright outdoor lighting. Clear any walking paths of anything that might make someone trip, such as rocks or tools. Regularly check to see if handrails are loose or broken. Make sure that both sides of any steps have handrails. Any raised decks and porches  should have guardrails on the edges. Have any leaves, snow, or ice cleared regularly. Use sand or salt on walking paths during winter. Clean up any spills in your garage right away. This includes oil or grease spills. What can I do in the bathroom? Use night lights. Install grab bars by the toilet and in the tub and shower. Do not use towel bars as grab bars. Use non-skid mats or decals in the tub or shower. If you need to sit down in the shower, use a plastic, non-slip stool. Keep the floor dry. Clean up any water that spills on the floor as soon as it happens. Remove soap buildup in the tub or shower regularly. Attach bath mats securely with double-sided non-slip rug tape. Do not have throw rugs and other things on the floor that can make you trip. What can I do in the bedroom? Use night lights. Make sure that you have a light by your bed that is easy to reach. Do not use any sheets or blankets that are too big for your bed. They should not hang down onto the floor. Have a firm chair that has side arms.  You can use this for support while you get dressed. Do not have throw rugs and other things on the floor that can make you trip. What can I do in the kitchen? Clean up any spills right away. Avoid walking on wet floors. Keep items that you use a lot in easy-to-reach places. If you need to reach something above you, use a strong step stool that has a grab bar. Keep electrical cords out of the way. Do not use floor polish or wax that makes floors slippery. If you must use wax, use non-skid floor wax. Do not have throw rugs and other things on the floor that can make you trip. What can I do with my stairs? Do not leave any items on the stairs. Make sure that there are handrails on both sides of the stairs and use them. Fix handrails that are broken or loose. Make sure that handrails are as long as the stairways. Check any carpeting to make sure that it is firmly attached to the stairs.  Fix any carpet that is loose or worn. Avoid having throw rugs at the top or bottom of the stairs. If you do have throw rugs, attach them to the floor with carpet tape. Make sure that you have a light switch at the top of the stairs and the bottom of the stairs. If you do not have them, ask someone to add them for you. What else can I do to help prevent falls? Wear shoes that: Do not have high heels. Have rubber bottoms. Are comfortable and fit you well. Are closed at the toe. Do not wear sandals. If you use a stepladder: Make sure that it is fully opened. Do not climb a closed stepladder. Make sure that both sides of the stepladder are locked into place. Ask someone to hold it for you, if possible. Clearly mark and make sure that you can see: Any grab bars or handrails. First and last steps. Where the edge of each step is. Use tools that help you move around (mobility aids) if they are needed. These include: Canes. Walkers. Scooters. Crutches. Turn on the lights when you go into a dark area. Replace any light bulbs as soon as they burn out. Set up your furniture so you have a clear path. Avoid moving your furniture around. If any of your floors are uneven, fix them. If there are any pets around you, be aware of where they are. Review your medicines with your doctor. Some medicines can make you feel dizzy. This can increase your chance of falling. Ask your doctor what other things that you can do to help prevent falls. This information is not intended to replace advice given to you by your health care provider. Make sure you discuss any questions you have with your health care provider. Document Released: 07/10/2009 Document Revised: 02/19/2016 Document Reviewed: 10/18/2014 Elsevier Interactive Patient Education  2017 [employer].

## 2022-10-24 NOTE — Progress Notes (Unsigned)
Subjective:   Roger Guerra is a 67 y.o. male who presents for an Initial Medicare Annual Wellness Visit.  Review of Systems    ***       Objective:    There were no vitals filed for this visit. There is no height or weight on file to calculate BMI.     10/21/2022    2:37 PM 04/29/2022    7:12 AM 10/02/2021   11:37 AM 12/11/2020   12:06 PM 08/22/2017    1:55 PM 05/16/2017    5:15 PM 12/10/2016    7:35 AM  Advanced Directives  Does Patient Have a Medical Advance Directive? No No No No No No No  Would patient like information on creating a medical advance directive?  No - Patient declined No - Patient declined    No - Patient declined    Current Medications (verified) Outpatient Encounter Medications as of 10/25/2022  Medication Sig   5-Hydroxytryptophan (5-HTP PO) Take 150 mg by mouth daily.   AMBULATORY NON FORMULARY MEDICATION Medication Name: Trimix  PGE 30 mcg Pap 30 mg Phent 1 mg (Patient taking differently: daily as needed (ED). Medication Name: Trimix  PGE 30 mcg Pap 30 mg Phent 1 mg)   aspirin 81 MG tablet Take 81 mg by mouth daily.   carisoprodol (SOMA) 350 MG tablet TAKE (1) TABLET BY MOUTH THREE TIMES DAILY AS NEEDED.   cetirizine (ZYRTEC) 10 MG tablet Take 10 mg by mouth every evening.    Cholecalciferol (VITAMIN D3 PO) Take 1 tablet by mouth daily.   clotrimazole-betamethasone (LOTRISONE) cream Apply 1 Application topically 2 (two) times daily.   co-enzyme Q-10 30 MG capsule Take 30 mg by mouth daily.   empagliflozin (JARDIANCE) 10 MG TABS tablet Take 1 tablet (10 mg total) by mouth daily before breakfast.   Evolocumab (REPATHA SURECLICK) 867 MG/ML SOAJ Inject 140 mg into the skin every 14 (fourteen) days.   ezetimibe (ZETIA) 10 MG tablet Take 1 tablet (10 mg total) by mouth daily.   fexofenadine (ALLEGRA) 180 MG tablet Take 180 mg by mouth every morning.    finasteride (PROSCAR) 5 MG tablet Take 1 tablet (5 mg total) by mouth daily.   fluconazole  (DIFLUCAN) 150 MG tablet Take one tablet by mouth now. Repeat in 3 days.   glipiZIDE (GLUCOTROL) 10 MG tablet Take 1 tablet (10 mg total) by mouth 2 (two) times daily before a meal.   glucose blood (ONETOUCH ULTRA) test strip check blood sugar once daily. DX:E11.69   hydrocortisone cream 1 % Apply to affected area 2 times daily   ibuprofen (ADVIL) 800 MG tablet TAKE (1) TABLET BY MOUTH IN THE MORNING AND AT BEDTIME.   magnesium oxide (MAG-OX) 400 (240 Mg) MG tablet Take 400 mg by mouth daily.   Multiple Vitamin (MULTIVITAMIN WITH MINERALS) TABS tablet Take 1 tablet by mouth daily.   naproxen sodium (ALEVE) 220 MG tablet Take 220 mg by mouth daily as needed (pain).   Omega-3 Fatty Acids (FISH OIL) 1000 MG CAPS Take 1,000 Units by mouth daily.   omeprazole (PRILOSEC OTC) 20 MG tablet Take 1 tablet (20 mg total) by mouth daily as needed (acid reflux).   ondansetron (ZOFRAN) 4 MG tablet Take 1 tablet (4 mg total) by mouth every 8 (eight) hours as needed for nausea or vomiting.   oxyCODONE-acetaminophen (PERCOCET/ROXICET) 5-325 MG tablet Take 1 tablet by mouth 2 (two) times daily as needed for moderate pain.   Scar Treatment Products (Fossil) GEL  Apply 1 Application topically 2 (two) times daily.   Semaglutide (RYBELSUS) 3 MG TABS Take 3 mg by mouth daily.   silodosin (RAPAFLO) 8 MG CAPS capsule Take 1 capsule (8 mg total) by mouth in the morning and at bedtime.   sodium chloride (OCEAN) 0.65 % SOLN nasal spray Place 1 spray into both nostrils 2 (two) times daily.   SYRINGE-NEEDLE, DISP, 3 ML (LUER LOCK SAFETY SYRINGES) 22G X 1" 3 ML MISC For testosterone  injections   vitamin E 400 UNIT capsule Take 400 Units by mouth daily.   zinc gluconate 50 MG tablet Take 50 mg by mouth daily.   No facility-administered encounter medications on file as of 10/25/2022.    Allergies (verified) Invokana [canagliflozin] and Statins   History: Past Medical History:  Diagnosis Date   Anxiety disorder     Arthritis    Atherosclerosis    Back pain    Constipation    Coronary artery calcification seen on CT scan    Depression    Diabetes (HCC)    Erectile dysfunction    GERD (gastroesophageal reflux disease)    Heartburn    History of blood clots    History of DVT (deep vein thrombosis)    History of DVT of lower extremity    Right leg 2002    History of pneumonia    Hyperlipidemia    Hypogonadism in male    Hypopituitarism ([place])    Joint pain    Nerve root and plexus disorder    Obesity    Obstructive sleep apnea    OSA on CPAP    Osteoarthritis    Sciatica    Sleep apnea    SOBOE (shortness of breath on exertion)    Spinal stenosis    Spondylosis    Swelling of both lower extremities    Type 2 diabetes mellitus ([place])    Vitamin D deficiency    Past Surgical History:  Procedure Laterality Date   BLADDER SURGERY  2015   removal of skin tag near urethra   COLONOSCOPY N/A 12/10/2016   Procedure: COLONOSCOPY;  Surgeon: Daneil Dolin, MD;  Location: [facility];  Service: Endoscopy;  Laterality: N/A;  8:30 AM   COLONOSCOPY WITH PROPOFOL N/A 04/29/2022   Procedure: COLONOSCOPY WITH PROPOFOL;  Surgeon: Daneil Dolin, MD;  Location: [facility];  Service: Endoscopy;  Laterality: N/A;  8:00am   KNEE ARTHROSCOPY WITH MEDIAL MENISECTOMY Right 08/25/2017   Procedure: Right knee arthroscopy, partial medial menisectomy and debridement;  Surgeon: Susa Day, MD;  Location: [facility];  Service: Orthopedics;  Laterality: Right;  60 mins   POLYPECTOMY  12/10/2016   Procedure: POLYPECTOMY;  Surgeon: Daneil Dolin, MD;  Location: [facility];  Service: Endoscopy;;  hepatic flexure   PTOSIS REPAIR Bilateral 02/2022   TONSILLECTOMY     Family History  Problem Relation Age of Onset   Diabetes Mother    Lung cancer Father    Heart disease Father    Stroke Father    Cancer Father    Diabetes Sister    Diabetes Other        Siblings   Social History   Socioeconomic History    Marital status: Married    Spouse name: Buster Schueller   Number of children: 2   Years of education: Not on file   Highest education level: Master's degree (e.g., MA, MS, MEng, MEd, MSW, MBA)  Occupational History   Occupation: Retired Pharmacist, hospital  but work Games developer  Tobacco Use   Smoking status: Former    Packs/day: 2.50    Years: 15.00    Total pack years: 37.50    Types: Cigarettes   Smokeless tobacco: Never  Vaping Use   Vaping Use: Never used  Substance and Sexual Activity   Alcohol use: No    Alcohol/week: 0.0 standard drinks of alcohol   Drug use: No   Sexual activity: Not on file  Other Topics Concern   Not on file  Social History Narrative   Not on file   Social Determinants of Health   Financial Resource Strain: Not on file  Food Insecurity: Not on file  Transportation Needs: Not on file  Physical Activity: Not on file  Stress: Not on file  Social Connections: Not on file    Tobacco Counseling Counseling given: Not Answered   Clinical Intake:              How often do you need to have someone help you when you read instructions, pamphlets, or other written materials from your doctor or pharmacy?: (P) 1 - Never  Diabetic?Yes  Nutrition Risk Assessment:  Has the patient had any N/V/D within the last 2 months?  {YES/NO:21197} Does the patient have any non-healing wounds?  {YES/NO:21197} Has the patient had any unintentional weight loss or weight gain?  {YES/NO:21197}  Diabetes:  Is the patient diabetic?  {YES/NO:21197} If diabetic, was a CBG obtained today?  {YES/NO:21197} Did the patient bring in their glucometer from home?  {YES/NO:21197} How often do you monitor your CBG's? ***.   Financial Strains and Diabetes Management:  Are you having any financial strains with the device, your supplies or your medication? {YES/NO:21197}.  Does the patient want to be seen by Chronic Care Management for management of their diabetes?   {YES/NO:21197} Would the patient like to be referred to a Nutritionist or for Diabetic Management?  {YES/NO:21197}  Diabetic Exams:  Diabetic Eye Exam: Completed 11/11/21 {Diabetic Foot Exam:2101802}          Activities of Daily Living    10/21/2022    7:31 PM  In your present state of health, do you have any difficulty performing the following activities:  Hearing? 0  Vision? 0  Difficulty concentrating or making decisions? 0  Walking or climbing stairs? 1  Dressing or bathing? 0  Doing errands, shopping? 0  Preparing Food and eating ? N  Using the Toilet? N  In the past six months, have you accidently leaked urine? Y  Do you have problems with loss of bowel control? N  Managing your Medications? N  Managing your Finances? N  Housekeeping or managing your Housekeeping? N    Patient Care Team: Chevis Pretty, FNP as PCP - General (Family Medicine) Satira Sark, MD as PCP - Cardiology (Cardiology) Sadie Haber, MD as Referring Physician (Urology)  Indicate any recent Medical Services you may have received from other than [facility] providers in the past year (date may be approximate).     Assessment:   This is a routine wellness examination for Donis.  Hearing/Vision screen No results found.  Dietary issues and exercise activities discussed:     Goals Addressed   None    Depression Screen    09/30/2022    3:04 PM 08/17/2022   11:35 AM 05/11/2022   10:18 AM 11/05/2021    9:35 AM 10/02/2021   11:38 AM 05/05/2021   10:09 AM 05/05/2021    9:43  AM  PHQ 2/9 Scores  PHQ - 2 Score 0 0 0 0 0 0   PHQ- 9 Score '2 1 2 '$ 0 2 0   Exception Documentation       Other- indicate reason in comment box  Not completed       Patient is seeing a specialist for this    Fall Risk    10/21/2022    7:31 PM 09/30/2022    3:04 PM 08/17/2022   11:35 AM 05/11/2022   10:18 AM 11/05/2021    9:35 AM  [place] in the past year? 0 0 0 0 1  Number falls in past yr: 0    0   Injury with Fall? 0    0  Risk for fall due to :     History of fall(s)  Follow up     Education provided    [place]:  Any stairs in or around the home? {YES/NO:21197} If so, are there any without handrails? {YES/NO:21197} Home free of loose throw rugs in walkways, pet beds, electrical cords, etc? {YES/NO:21197} Adequate lighting in your home to reduce risk of falls? {YES/NO:21197}  ASSISTIVE DEVICES UTILIZED TO PREVENT FALLS:  Life alert? {YES/NO:21197} Use of a cane, walker or w/c? {YES/NO:21197} Grab bars in the bathroom? {YES/NO:21197} Shower chair or bench in shower? {YES/NO:21197} Elevated toilet seat or a handicapped toilet? {YES/NO:21197}  TIMED UP AND GO:  Was the test performed? No . Telephonic visit   Cognitive Function:    10/02/2021   11:39 AM  MMSE - Mini Mental State Exam  Orientation to time 5  Orientation to Place 5  Registration 3  Attention/ Calculation 5  Recall 3  Language- name 2 objects 2  Language- repeat 1  Language- follow 3 step command 3  Language- read & follow direction 1  Write a sentence 1  Copy design 1  Total score 30        Immunizations Immunization History  Administered Date(s) Administered   Fluad Quad(high Dose 65+) 08/14/2021, 10/02/2021, 08/17/2022   Influenza,inj,Quad PF,6+ Mos 07/28/2015   Moderna SARS-COV2 Booster Vaccination 01/15/2020, 08/28/2020   Moderna Sars-Covid-2 Vaccination 12/15/2019   Tdap 05/05/2021   Zoster Recombinat (Shingrix) 05/10/2017, 07/16/2017    TDAP status: Up to date  Flu Vaccine status: Up to date  {Pneumococcal vaccine status:2101807}  Covid-19 vaccine status: Information provided on how to obtain vaccines.   Qualifies for Shingles Vaccine? Yes   Zostavax completed No   Shingrix Completed?: Yes  Screening Tests Health Maintenance  Topic Date Due   Pneumonia Vaccine 38+ Years old (1 - PCV) Never done   Hepatitis C Screening  Never done    Lung Cancer Screening  02/06/2022   FOOT EXAM  05/05/2022   COVID-19 Vaccine (4 - 2023-24 season) 05/28/2022   Medicare Annual Wellness (AWV)  10/02/2022   OPHTHALMOLOGY EXAM  11/11/2022   HEMOGLOBIN A1C  03/31/2023   Diabetic kidney evaluation - eGFR measurement  05/12/2023   Diabetic kidney evaluation - Urine ACR  05/12/2023   DTaP/Tdap/Td (2 - Td or Tdap) 05/06/2031   COLONOSCOPY (Pts 45-53yr Insurance coverage will need to be confirmed)  04/29/2032   INFLUENZA VACCINE  Completed   Zoster Vaccines- Shingrix  Completed   HPV VACCINES  Aged Out    Health Maintenance  Health Maintenance Due  Topic Date Due   Pneumonia Vaccine 67 Years old (1 - PCV) Never done   Hepatitis  C Screening  Never done   Lung Cancer Screening  02/06/2022   FOOT EXAM  05/05/2022   COVID-19 Vaccine (4 - 2023-24 season) 05/28/2022   Medicare Annual Wellness (AWV)  10/02/2022    Colorectal cancer screening: Type of screening: Colonoscopy. Completed 04/29/22. Repeat every 10 years  Lung Cancer Screening: (Low Dose CT Chest recommended if Age 42-80 years, 30 pack-year currently smoking OR have quit w/in 15years.) does qualify.   Lung Cancer Screening Referral: ***  Additional Screening:  Hepatitis C Screening: {DOES NOT does:27190::"does not"} qualify; Completed ***  Vision Screening: Recommended annual ophthalmology exams for early detection of glaucoma and other disorders of the eye. Is the patient up to date with their annual eye exam?  {YES/NO:21197} Who is the provider or what is the name of the office in which the patient attends annual eye exams? *** If pt is not established with a provider, would they like to be referred to a provider to establish care? {YES/NO:21197}.   Dental Screening: Recommended annual dental exams for proper oral hygiene  Community Resource Referral / Chronic Care Management: CRR required this visit?  {YES/NO:21197}  CCM required this visit?  {YES/NO:21197}      Plan:     I have personally reviewed and noted the following in the patient's chart:   Medical and social history Use of alcohol, tobacco or illicit drugs  Current medications and supplements including opioid prescriptions. {Opioid Prescriptions:[phone]} Functional ability and status Nutritional status Physical activity Advanced directives List of other physicians Hospitalizations, surgeries, and ER visits in previous 12 months Vitals Screenings to include cognitive, depression, and falls Referrals and appointments  In addition, I have reviewed and discussed with patient certain preventive protocols, quality metrics, and best practice recommendations. A written personalized care plan for preventive services as well as general preventive health recommendations were provided to patient.     Vanetta Mulders, [place]   03/10/4314   Due to this being a virtual visit, the after visit summary with patients personalized plan was offered to patient via mail or my-chart. ***Patient declined at this time./ Patient would like to access on my-chart/ per request, patient was mailed a copy of AVS./ Patient preferred to pick up at office at next visit  Nurse Notes: ***

## 2022-10-25 ENCOUNTER — Ambulatory Visit (INDEPENDENT_AMBULATORY_CARE_PROVIDER_SITE_OTHER): Payer: Medicare HMO

## 2022-10-25 VITALS — Ht 71.0 in | Wt 256.0 lb

## 2022-10-25 DIAGNOSIS — Z0000 Encounter for general adult medical examination without abnormal findings: Secondary | ICD-10-CM

## 2022-10-26 ENCOUNTER — Ambulatory Visit: Payer: Medicare HMO | Admitting: Physical Therapy

## 2022-10-26 DIAGNOSIS — M25562 Pain in left knee: Secondary | ICD-10-CM

## 2022-10-26 NOTE — Therapy (Signed)
OUTPATIENT PHYSICAL THERAPY LOWER EXTREMITY EVALUATION   Patient Name: Roger Guerra MRN: [id number] DOB:[birth year], 67 y.o., male Today's Date: 10/26/2022  END OF SESSION:  PT End of Session - 10/26/22 1443     Visit Number 2    Number of Visits 8    Date for PT Re-Evaluation 11/18/22    Authorization Type FOTO.    PT Start Time 0230    PT Stop Time 0320    PT Time Calculation (min) 50 min    Activity Tolerance Patient tolerated treatment well    Behavior During Therapy WFL for tasks assessed/performed             Past Medical History:  Diagnosis Date   Anxiety disorder    Arthritis    Atherosclerosis    Back pain    Constipation    Coronary artery calcification seen on CT scan    Depression    Diabetes (HCC)    Erectile dysfunction    GERD (gastroesophageal reflux disease)    Heartburn    History of blood clots    History of DVT (deep vein thrombosis)    History of DVT of lower extremity    Right leg 2002    History of pneumonia    Hyperlipidemia    Hypogonadism in male    Hypopituitarism ([place])    Joint pain    Nerve root and plexus disorder    Obesity    Obstructive sleep apnea    OSA on CPAP    Osteoarthritis    Sciatica    Sleep apnea    SOBOE (shortness of breath on exertion)    Spinal stenosis    Spondylosis    Swelling of both lower extremities    Type 2 diabetes mellitus ([place])    Vitamin D deficiency    Past Surgical History:  Procedure Laterality Date   BLADDER SURGERY  2015   removal of skin tag near urethra   COLONOSCOPY N/A 12/10/2016   Procedure: COLONOSCOPY;  Surgeon: Daneil Dolin, MD;  [facility];  Service: Endoscopy;  Laterality: N/A;  8:30 AM   COLONOSCOPY WITH PROPOFOL N/A 04/29/2022   Procedure: COLONOSCOPY WITH PROPOFOL;  Surgeon: Daneil Dolin, MD;  [facility];  Service: Endoscopy;  Laterality: N/A;  8:00am   KNEE ARTHROSCOPY WITH MEDIAL MENISECTOMY Right 08/25/2017   Procedure: Right knee  arthroscopy, partial medial menisectomy and debridement;  Surgeon: Susa Day, MD;  [facility];  Service: Orthopedics;  Laterality: Right;  60 mins   POLYPECTOMY  12/10/2016   Procedure: POLYPECTOMY;  Surgeon: Daneil Dolin, MD;  [facility];  Service: Endoscopy;;  hepatic flexure   PTOSIS REPAIR Bilateral 02/2022   TONSILLECTOMY     Patient Active Problem List   Diagnosis Date Noted   Type 2 diabetes mellitus with hyperglycemia, without long-term current use of insulin ([place]) 05/10/2022   Cervical radiculopathy 03/01/2022   DDD (degenerative disc disease), cervical 03/01/2022   Gastroesophageal reflux disease 10/27/2021   Class 2 severe obesity with serious comorbidity and body mass index (BMI) of 36.0 to 36.9 in adult [facility]) 08/12/2021   Hypopituitarism ([place]) 05/05/2021   Hyperlipidemia associated with type 2 diabetes mellitus ([place]) 03/03/2021   Vitamin D deficiency 03/03/2021   Depression 03/03/2021   OSA on CPAP 02/17/2021   At risk for heart disease 02/17/2021   Bilateral carpal tunnel syndrome 11/04/2020   Male hypogonadism 07/24/2015    REFERRING PROVIDER: Lacie Draft PA-C  REFERRING DIAG: OA of left knee.  THERAPY DIAG:  Acute pain of left knee  Rationale for Evaluation and Treatment: Rehabilitation  ONSET DATE: October 2023.  SUBJECTIVE:   SUBJECTIVE STATEMENT: No new complaints. PERTINENT HISTORY: H/o back pain and right knee pain. PAIN:  Are you having pain? Yes: NPRS scale: 4/10 Pain location: Left knee. Pain description: As above. Aggravating factors: As above. Relieving factors: As above.  PRECAUTIONS: Other: Therex that does not increase pain.  WEIGHT BEARING RESTRICTIONS: No  FALLS:  Has patient fallen in last 6 months? No  LIVING ENVIRONMENT: Lives with: lives with their spouse Lives in: House/apartment Stairs: Perform in non-reciprocating fashion. Has following equipment at home: None  OCCUPATION: Retired.     PLOF: Independent  PATIENT GOALS: Get knee replaced.  NEXT MD VISIT:   OBJECTIVE:                                      EXERCISE LOG  Exercise Repetitions and Resistance Comments  Recumbent bike 15 minutes at level 3.   Knee ext 10# 3 minutes   Ham curls 30# 3 minutes   Leg press 3 plates  3 minutes   Wall slides  To fatigue.   IFC and vasopneumatic on low to patient's left medial knee knee x 15 minutes.   PATIENT SURVEYS:  FOTO 105.  EDEMA:  Circumferential: No left knee edema.  PALPATION: Tender to palpation over left knee medial joint line.  LOWER EXTREMITY ROM: Full active left knee range of motion.  LOWER EXTREMITY MMT: Normal left knee strength.  LOWER EXTREMITY SPECIAL TESTS:  Stable left knee joint. GAIT: WNL.     PATIENT EDUCATION:  Education details: Discussed in detail designing an exercise program that does not increase his pain and avoid activities that increase pain. Person educated: Patient Education method: Explanation  ASSESSMENT:  CLINICAL IMPRESSION: Patient did great with there ex today including weight machines.  No pain increase.  He is very motivated.  OBJECTIVE IMPAIRMENTS: decreased activity tolerance and pain.   ACTIVITY LIMITATIONS: bending and squatting  PARTICIPATION LIMITATIONS: yard work  [employer] POTENTIAL: Good  CLINICAL DECISION MAKING: Stable/uncomplicated  EVALUATION COMPLEXITY: Low   GOALS: LONG TERM GOALS: Target date: 11/18/22.  Ind with a HEP. Goal status: INITIAL  2.  Perform ADL's with pain not > 4/10. Goal status: INITIAL  3.  Perform a reciprocating stair gait with one railing and pain not > 4/10. Goal status: INITIAL  PLAN:  PT FREQUENCY: 2x/week  PT DURATION: 4 weeks  PLANNED INTERVENTIONS: Therapeutic exercises, Therapeutic activity, Gait training, Patient/Family education, Self Care, Electrical stimulation, Cryotherapy, Moist heat, Vasopneumatic device, and Manual therapy, Iontophoresis 4  mg/ml  PLAN FOR NEXT SESSION: O and CKC that does increase knee pain.  Recumbent bike.  Modalities as needed.   Michalla Ringer, [place], PT 10/26/2022, 3:38 PM

## 2022-10-27 ENCOUNTER — Encounter: Payer: Self-pay | Admitting: Pharmacist

## 2022-10-27 NOTE — Progress Notes (Signed)
[place] Team Statin Quality Measure Assessment   10/27/2022  Roger Guerra July 01, 1956 [id number]  Per review of chart and payor information, patient has a diagnosis of diabetes but is not currently filling a statin prescription.  This places patient into the Statin Use In Patients with Diabetes (SUPD) measure for CMS.    Patient has documented allergy to statin but no corresponding CPT codes that would exclude patient from SUPD measure. (For the current benefit year.) He has an upcoming appointment 10/28/22.  If deemed therapeutically appropriate, a statin exclusion code could be associated with the upcoming visit.              Component Value Date/Time   CHOL 99 (L) 05/11/2022 1018   TRIG 81 05/11/2022 1018   HDL 34 (L) 05/11/2022 1018   CHOLHDL 2.9 05/11/2022 1018   LDLCALC 48 05/11/2022 1018    Please consider ONE of the following recommendations:  Initiate moderate intensity statin Atorvastatin 10 mg once daily, #90, 3 refills   Rosuvastatin 5 mg once daily, #90, 3 refills    Initiate low intensity          statin with reduced frequency if prior          statin intolerance 1x weekly, #13, 3 refills   2x weekly, #26, 3 refills   3x weekly, #39, 3 refills    Code for past statin intolerance or  other exclusions (required annually)  Provider Requirements: Associate code during an office visit or telehealth encounter  Drug Induced Myopathy G72.0   Myopathy, unspecified G72.9   Myositis, unspecified M60.9   Rhabdomyolysis M62.82   Cirrhosis of liver K74.69   Prediabetes R73.03   PCOS E28.2   Plan:  Route note to patient's provider prior to upcoming appointment.  Elayne Guerin, PharmD, [place] Clinical Pharmacist [phone]

## 2022-10-28 ENCOUNTER — Encounter: Payer: Self-pay | Admitting: Nurse Practitioner

## 2022-10-28 ENCOUNTER — Ambulatory Visit (INDEPENDENT_AMBULATORY_CARE_PROVIDER_SITE_OTHER): Payer: Medicare HMO | Admitting: Nurse Practitioner

## 2022-10-28 VITALS — BP 132/84 | HR 93 | Temp 98.2°F | Resp 20 | Ht 71.0 in | Wt 259.0 lb

## 2022-10-28 DIAGNOSIS — Z7984 Long term (current) use of oral hypoglycemic drugs: Secondary | ICD-10-CM

## 2022-10-28 DIAGNOSIS — E1165 Type 2 diabetes mellitus with hyperglycemia: Secondary | ICD-10-CM | POA: Diagnosis not present

## 2022-10-28 DIAGNOSIS — M5030 Other cervical disc degeneration, unspecified cervical region: Secondary | ICD-10-CM | POA: Diagnosis not present

## 2022-10-28 LAB — BAYER DCA HB A1C WAIVED: HB A1C (BAYER DCA - WAIVED): 8.8 % — ABNORMAL HIGH (ref 4.8–5.6)

## 2022-10-28 MED ORDER — CARISOPRODOL 350 MG PO TABS: 350.0000 mg | ORAL_TABLET | Freq: Three times a day (TID) | ORAL | 2 refills | Status: DC

## 2022-10-28 NOTE — Progress Notes (Signed)
   Subjective:    Patient ID: Roger Guerra, male    DOB: [birth year], 67 y.o.   MRN: [id number]   Chief Complaint: Diabetes and Wants Soma refilled   Diabetes Pertinent negatives for hypoglycemia include no dizziness or headaches. Pertinent negatives for diabetes include no chest pain, no polydipsia and no weakness.   - patient has ben trying to get diabetes under control so that he can have total knee replacement.  He was last seen on 09/30/22 with hgbac1c of 9.7%. We added rybelsus to meds. Since then blood sugars have been runnng around 180 average - he is on SOma for arthritis. He has been on it for 6-7 years and take TID as needed. He says it helps.    Review of Systems  Constitutional:  Negative for diaphoresis.  Eyes:  Negative for pain.  Respiratory:  Negative for shortness of breath.   Cardiovascular:  Negative for chest pain, palpitations and leg swelling.  Gastrointestinal:  Negative for abdominal pain.  Endocrine: Negative for polydipsia.  Skin:  Negative for rash.  Neurological:  Negative for dizziness, weakness and headaches.  Hematological:  Does not bruise/bleed easily.  All other systems reviewed and are negative.      Objective:   Physical Exam Vitals reviewed.  Constitutional:      Appearance: Normal appearance. He is obese.  Cardiovascular:     Rate and Rhythm: Normal rate and regular rhythm.     Heart sounds: Normal heart sounds.  Pulmonary:     Effort: Pulmonary effort is normal.     Breath sounds: Normal breath sounds.  Skin:    General: Skin is warm.  Neurological:     General: No focal deficit present.     Mental Status: He is alert and oriented to person, place, and time.  Psychiatric:        Mood and Affect: Mood normal.        Behavior: Behavior normal.    BP 132/84   Pulse 93   Temp 98.2 F (36.8 C) (Temporal)   Resp 20   Ht '5\' 11"'$  (1.803 m)   Wt 259 lb (117.5 kg)   SpO2 94%   BMI 36.12 kg/m   HGBA1c 8.8%       Assessment  & Plan:   Roger Guerra in today with chief complaint of Diabetes and Wants Soma refilled   1. Type 2 diabetes mellitus with hyperglycemia, without long-term current use of insulin (HCC) Continue  to watch carbs in diet - Bayer DCA Hb A1c Waived  2. DDD (degenerative disc disease), cervical Back stretches Moist heat - carisoprodol (SOMA) 350 MG tablet; Take 1 tablet (350 mg total) by mouth 3 (three) times daily.  Dispense: 90 tablet; Refill: 2    The above assessment and management plan was discussed with the patient. The patient verbalized understanding of and has agreed to the management plan. Patient is aware to call the clinic if symptoms persist or worsen. Patient is aware when to return to the clinic for a follow-up visit. Patient educated on when it is appropriate to go to the emergency department.   Mary-Margaret Hassell Done, FNP

## 2022-10-29 ENCOUNTER — Ambulatory Visit: Payer: Medicare HMO | Attending: Orthopedic Surgery | Admitting: Physical Therapy

## 2022-10-29 ENCOUNTER — Encounter: Payer: Self-pay | Admitting: Physical Therapy

## 2022-10-29 DIAGNOSIS — M25562 Pain in left knee: Secondary | ICD-10-CM | POA: Insufficient documentation

## 2022-10-29 DIAGNOSIS — R293 Abnormal posture: Secondary | ICD-10-CM | POA: Diagnosis not present

## 2022-10-29 NOTE — Therapy (Deleted)
OUTPATIENT PHYSICAL THERAPY LOWER EXTREMITY EVALUATION   Patient Name: Roger Guerra MRN: [id number] DOB:March 28, 1956, 67 y.o., male Today's Date: 10/29/2022  END OF SESSION:  PT End of Session - 10/29/22 1116     Visit Number 3    Number of Visits 8    Date for PT Re-Evaluation 11/18/22    Authorization Type FOTO.    Activity Tolerance Patient tolerated treatment well    Behavior During Therapy WFL for tasks assessed/performed            Past Medical History:  Diagnosis Date   Anxiety disorder    Arthritis    Atherosclerosis    Back pain    Constipation    Coronary artery calcification seen on CT scan    Depression    Diabetes (HCC)    Erectile dysfunction    GERD (gastroesophageal reflux disease)    Heartburn    History of blood clots    History of DVT (deep vein thrombosis)    History of DVT of lower extremity    Right leg 2002    History of pneumonia    Hyperlipidemia    Hypogonadism in male    Hypopituitarism ([place])    Joint pain    Nerve root and plexus disorder    Obesity    Obstructive sleep apnea    OSA on CPAP    Osteoarthritis    Sciatica    Sleep apnea    SOBOE (shortness of breath on exertion)    Spinal stenosis    Spondylosis    Swelling of both lower extremities    Type 2 diabetes mellitus ([place])    Vitamin D deficiency    Past Surgical History:  Procedure Laterality Date   BLADDER SURGERY  2015   removal of skin tag near urethra   COLONOSCOPY N/A 12/10/2016   Procedure: COLONOSCOPY;  Surgeon: Daneil Dolin, MD;  [facility];  Service: Endoscopy;  Laterality: N/A;  8:30 AM   COLONOSCOPY WITH PROPOFOL N/A 04/29/2022   Procedure: COLONOSCOPY WITH PROPOFOL;  Surgeon: Daneil Dolin, MD;  [facility];  Service: Endoscopy;  Laterality: N/A;  8:00am   KNEE ARTHROSCOPY WITH MEDIAL MENISECTOMY Right 08/25/2017   Procedure: Right knee arthroscopy, partial medial menisectomy and debridement;  Surgeon: Susa Day, MD;   [facility];  Service: Orthopedics;  Laterality: Right;  60 mins   POLYPECTOMY  12/10/2016   Procedure: POLYPECTOMY;  Surgeon: Daneil Dolin, MD;  [facility];  Service: Endoscopy;;  hepatic flexure   PTOSIS REPAIR Bilateral 02/2022   TONSILLECTOMY     Patient Active Problem List   Diagnosis Date Noted   Type 2 diabetes mellitus with hyperglycemia, without long-term current use of insulin ([place]) 05/10/2022   Cervical radiculopathy 03/01/2022   DDD (degenerative disc disease), cervical 03/01/2022   Gastroesophageal reflux disease 10/27/2021   Class 2 severe obesity with serious comorbidity and body mass index (BMI) of 36.0 to 36.9 in adult [facility]) 08/12/2021   Hypopituitarism ([place]) 05/05/2021   Hyperlipidemia associated with type 2 diabetes mellitus ([place]) 03/03/2021   Vitamin D deficiency 03/03/2021   Depression 03/03/2021   OSA on CPAP 02/17/2021   At risk for heart disease 02/17/2021   Bilateral carpal tunnel syndrome 11/04/2020   Male hypogonadism 07/24/2015    REFERRING PROVIDER: Lacie Draft PA-C  REFERRING DIAG: OA of left knee.  THERAPY DIAG:  Acute pain of left knee  Abnormal posture  Rationale for Evaluation and  Treatment: Rehabilitation  ONSET DATE: October 2023.  SUBJECTIVE:   SUBJECTIVE STATEMENT: No new complaints. PERTINENT HISTORY: H/o back pain and right knee pain. PAIN:  Are you having pain? Yes: NPRS scale: 4/10 Pain location: Left knee. Pain description: As above. Aggravating factors: As above. Relieving factors: As above.  PRECAUTIONS: Other: Therex that does not increase pain.  PATIENT GOALS: Get knee replaced.  NEXT MD VISIT:   OBJECTIVE:                                    EXERCISE LOG  Exercise Repetitions and Resistance Comments  Recumbent bike 15 minutes at level 3.   Knee ext 10# 3 minutes   Ham curls 30# 3 minutes   Leg press 3 plates  3 minutes   Wall slides  To fatigue.   IFC and vasopneumatic on low to  patient's left medial knee knee x 15 minutes.   PATIENT SURVEYS:  FOTO 17.  PATIENT EDUCATION:  Education details: Discussed in detail designing an exercise program that does not increase his pain and avoid activities that increase pain. Person educated: Patient Education method: Explanation  ASSESSMENT:  CLINICAL IMPRESSION: Patient did great with there ex today including weight machines.  No pain increase.  He is very motivated.  OBJECTIVE IMPAIRMENTS: decreased activity tolerance and pain.   ACTIVITY LIMITATIONS: bending and squatting  PARTICIPATION LIMITATIONS: yard work  [employer] POTENTIAL: Good  CLINICAL DECISION MAKING: Stable/uncomplicated  EVALUATION COMPLEXITY: Low  GOALS: LONG TERM GOALS: Target date: 11/18/22.  Ind with a HEP. Goal status: INITIAL  2.  Perform ADL's with pain not > 4/10. Goal status: INITIAL  3.  Perform a reciprocating stair gait with one railing and pain not > 4/10. Goal status: INITIAL  PLAN:  PT FREQUENCY: 2x/week  PT DURATION: 4 weeks  PLANNED INTERVENTIONS: Therapeutic exercises, Therapeutic activity, Gait training, Patient/Family education, Self Care, Electrical stimulation, Cryotherapy, Moist heat, Vasopneumatic device, and Manual therapy, Iontophoresis 4 mg/ml  PLAN FOR NEXT SESSION: O and CKC that does increase knee pain.  Recumbent bike.  Modalities as needed.  Standley Brooking, PTA 10/29/2022, 11:39 AM

## 2022-10-29 NOTE — Therapy (Signed)
OUTPATIENT PHYSICAL THERAPY LOWER EXTREMITY EVALUATION   Patient Name: Roger Guerra MRN: [id number] DOB:Feb 17, 1956, 67 y.o., male Today's Date: 10/29/2022  END OF SESSION:  PT End of Session - 10/29/22 1116     Visit Number 3    Number of Visits 8    Date for PT Re-Evaluation 11/18/22    Authorization Type FOTO.    PT Start Time 1119    PT Stop Time 1213    PT Time Calculation (min) 54 min    Activity Tolerance Patient tolerated treatment well    Behavior During Therapy WFL for tasks assessed/performed            Past Medical History:  Diagnosis Date   Anxiety disorder    Arthritis    Atherosclerosis    Back pain    Constipation    Coronary artery calcification seen on CT scan    Depression    Diabetes (HCC)    Erectile dysfunction    GERD (gastroesophageal reflux disease)    Heartburn    History of blood clots    History of DVT (deep vein thrombosis)    History of DVT of lower extremity    Right leg 2002    History of pneumonia    Hyperlipidemia    Hypogonadism in male    Hypopituitarism ([place])    Joint pain    Nerve root and plexus disorder    Obesity    Obstructive sleep apnea    OSA on CPAP    Osteoarthritis    Sciatica    Sleep apnea    SOBOE (shortness of breath on exertion)    Spinal stenosis    Spondylosis    Swelling of both lower extremities    Type 2 diabetes mellitus ([place])    Vitamin D deficiency    Past Surgical History:  Procedure Laterality Date   BLADDER SURGERY  2015   removal of skin tag near urethra   COLONOSCOPY N/A 12/10/2016   Procedure: COLONOSCOPY;  Surgeon: Daneil Dolin, MD;  [facility];  Service: Endoscopy;  Laterality: N/A;  8:30 AM   COLONOSCOPY WITH PROPOFOL N/A 04/29/2022   Procedure: COLONOSCOPY WITH PROPOFOL;  Surgeon: Daneil Dolin, MD;  [facility];  Service: Endoscopy;  Laterality: N/A;  8:00am   KNEE ARTHROSCOPY WITH MEDIAL MENISECTOMY Right 08/25/2017   Procedure: Right knee  arthroscopy, partial medial menisectomy and debridement;  Surgeon: Susa Day, MD;  [facility];  Service: Orthopedics;  Laterality: Right;  60 mins   POLYPECTOMY  12/10/2016   Procedure: POLYPECTOMY;  Surgeon: Daneil Dolin, MD;  [facility];  Service: Endoscopy;;  hepatic flexure   PTOSIS REPAIR Bilateral 02/2022   TONSILLECTOMY     Patient Active Problem List   Diagnosis Date Noted   Type 2 diabetes mellitus with hyperglycemia, without long-term current use of insulin ([place]) 05/10/2022   Cervical radiculopathy 03/01/2022   DDD (degenerative disc disease), cervical 03/01/2022   Gastroesophageal reflux disease 10/27/2021   Class 2 severe obesity with serious comorbidity and body mass index (BMI) of 36.0 to 36.9 in adult [facility]) 08/12/2021   Hypopituitarism ([place]) 05/05/2021   Hyperlipidemia associated with type 2 diabetes mellitus ([place]) 03/03/2021   Vitamin D deficiency 03/03/2021   Depression 03/03/2021   OSA on CPAP 02/17/2021   At risk for heart disease 02/17/2021   Bilateral carpal tunnel syndrome 11/04/2020   Male hypogonadism 07/24/2015   REFERRING PROVIDER: Lacie Draft PA-C  REFERRING  DIAG: OA of left knee.  THERAPY DIAG:  Acute pain of left knee  Abnormal posture  Rationale for Evaluation and Treatment: Rehabilitation  ONSET DATE: October 2023.  SUBJECTIVE:   SUBJECTIVE STATEMENT: Patient reporting more soreness and stiffness today.  PERTINENT HISTORY: H/o back pain and right knee pain.  PAIN:  Are you having pain? Yes: NPRS scale: 5/10 Pain location: Left knee. Pain description: Sore, stiff. Aggravating factors: As above. Relieving factors: As above.  PRECAUTIONS: Other: Therex that does not increase pain.  PATIENT GOALS: Get knee replaced.  NEXT MD VISIT: TBD  OBJECTIVE:                                    EXERCISE LOG  Exercise Repetitions and Resistance Comments  Recumbent bike 15 minutes at level 3.   Knee ext 10# 4  minutes   Ham curls 30# 4 minutes   Leg press 3 plates  4 minutes   Modalities  Date: 10/29/2022 Unattended Estim: Knee, IFC, 15 mins, Pain Vaso: Knee, Low, 15 mins, Pain  PATIENT SURVEYS:  FOTO 48.  PATIENT EDUCATION:  Education details: Discussed in detail designing an exercise program that does not increase his pain and avoid activities that increase pain. Person educated: Patient Education method: Explanation  ASSESSMENT:  CLINICAL IMPRESSION: Patient presented in clinic with reports of greater stiffness and soreness today. Patient more guarded and slower with transfers due to the discomfort. Patient able to tolerate therex with no complaints. Normal modalities response noted following removal of the modalities.  OBJECTIVE IMPAIRMENTS: decreased activity tolerance and pain.   ACTIVITY LIMITATIONS: bending and squatting  PARTICIPATION LIMITATIONS: yard work  [employer] POTENTIAL: Good  CLINICAL DECISION MAKING: Stable/uncomplicated  EVALUATION COMPLEXITY: Low  GOALS: LONG TERM GOALS: Target date: 11/18/22.  Ind with a HEP. Goal status: INITIAL  2.  Perform ADL's with pain not > 4/10. Goal status: INITIAL  3.  Perform a reciprocating stair gait with one railing and pain not > 4/10. Goal status: INITIAL  PLAN:  PT FREQUENCY: 2x/week  PT DURATION: 4 weeks  PLANNED INTERVENTIONS: Therapeutic exercises, Therapeutic activity, Gait training, Patient/Family education, Self Care, Electrical stimulation, Cryotherapy, Moist heat, Vasopneumatic device, and Manual therapy, Iontophoresis 4 mg/ml  PLAN FOR NEXT SESSION: O and CKC that does increase knee pain.  Recumbent bike.  Modalities as needed.  Standley Brooking, PTA 10/29/2022, 12:21 PM

## 2022-11-02 ENCOUNTER — Ambulatory Visit: Payer: Medicare HMO | Admitting: Physical Therapy

## 2022-11-02 DIAGNOSIS — R293 Abnormal posture: Secondary | ICD-10-CM | POA: Diagnosis not present

## 2022-11-02 DIAGNOSIS — M25562 Pain in left knee: Secondary | ICD-10-CM

## 2022-11-02 NOTE — Therapy (Signed)
OUTPATIENT PHYSICAL THERAPY LOWER EXTREMITY EVALUATION   Patient Name: Roger Guerra MRN: [id number] DOB:08/18/1956, 67 y.o., male Today's Date: 11/02/2022  END OF SESSION:  PT End of Session - 11/02/22 1605     Visit Number 4    Number of Visits 8    Date for PT Re-Evaluation 11/18/22    PT Start Time 0400    PT Stop Time 0456    PT Time Calculation (min) 56 min    Activity Tolerance Patient tolerated treatment well    Behavior During Therapy WFL for tasks assessed/performed            Past Medical History:  Diagnosis Date   Anxiety disorder    Arthritis    Atherosclerosis    Back pain    Constipation    Coronary artery calcification seen on CT scan    Depression    Diabetes (HCC)    Erectile dysfunction    GERD (gastroesophageal reflux disease)    Heartburn    History of blood clots    History of DVT (deep vein thrombosis)    History of DVT of lower extremity    Right leg 2002    History of pneumonia    Hyperlipidemia    Hypogonadism in male    Hypopituitarism ([name])    Joint pain    Nerve root and plexus disorder    Obesity    Obstructive sleep apnea    OSA on CPAP    Osteoarthritis    Sciatica    Sleep apnea    SOBOE (shortness of breath on exertion)    Spinal stenosis    Spondylosis    Swelling of both lower extremities    Type 2 diabetes mellitus (HCC)    Vitamin D deficiency    Past Surgical History:  Procedure Laterality Date   BLADDER SURGERY  2015   removal of skin tag near urethra   COLONOSCOPY N/A 12/10/2016   Procedure: COLONOSCOPY;  Surgeon: Daneil Dolin, MD;  [facility];  Service: Endoscopy;  Laterality: N/A;  8:30 AM   COLONOSCOPY WITH PROPOFOL N/A 04/29/2022   Procedure: COLONOSCOPY WITH PROPOFOL;  Surgeon: Daneil Dolin, MD;  [facility];  Service: Endoscopy;  Laterality: N/A;  8:00am   KNEE ARTHROSCOPY WITH MEDIAL MENISECTOMY Right 08/25/2017   Procedure: Right knee arthroscopy, partial medial  menisectomy and debridement;  Surgeon: Susa Day, MD;  [facility];  Service: Orthopedics;  Laterality: Right;  60 mins   POLYPECTOMY  12/10/2016   Procedure: POLYPECTOMY;  Surgeon: Daneil Dolin, MD;  [facility];  Service: Endoscopy;;  hepatic flexure   PTOSIS REPAIR Bilateral 02/2022   TONSILLECTOMY     Patient Active Problem List   Diagnosis Date Noted   Type 2 diabetes mellitus with hyperglycemia, without long-term current use of insulin ([place]) 05/10/2022   Cervical radiculopathy 03/01/2022   DDD (degenerative disc disease), cervical 03/01/2022   Gastroesophageal reflux disease 10/27/2021   Class 2 severe obesity with serious comorbidity and body mass index (BMI) of 36.0 to 36.9 in adult [facility]) 08/12/2021   Hypopituitarism ([place]) 05/05/2021   Hyperlipidemia associated with type 2 diabetes mellitus ([place]) 03/03/2021   Vitamin D deficiency 03/03/2021   Depression 03/03/2021   OSA on CPAP 02/17/2021   At risk for heart disease 02/17/2021   Bilateral carpal tunnel syndrome 11/04/2020   Male hypogonadism 07/24/2015   REFERRING PROVIDER: Lacie Draft PA-C  REFERRING DIAG: OA of left knee.  THERAPY DIAG:  Acute pain of left knee  Rationale for Evaluation and Treatment: Rehabilitation  ONSET DATE: October 2023.  SUBJECTIVE:   SUBJECTIVE STATEMENT: Patient reporting more soreness and stiffness today with pain at a 6.  PERTINENT HISTORY: H/o back pain and right knee pain.  PAIN:  Are you having pain? Yes: NPRS scale: 6/10 Pain location: Left knee. Pain description: Sore, stiff. Aggravating factors: As above. Relieving factors: As above.  PRECAUTIONS: Other: Therex that does not increase pain.  PATIENT GOALS: Get knee replaced.  NEXT MD VISIT: TBD  OBJECTIVE:                                    EXERCISE LOG  Exercise Repetitions and Resistance Comments  Recumbent bike 15 minutes at level 3.   Knee ext 10# 3 minutes   Ham curls 30# 3  minutes    Leg press 3 plates  3  minutes   Inverted BOSU in parallel bars in athlete stance for neuro-re-edu. 2 minutes.   Modalities  Date: 10/29/2022 Unattended Estim: Knee, IFC, 20 mins, Pain Vaso: Knee, Low, 20 mins, Pain  PATIENT SURVEYS:  FOTO 48.  PATIENT EDUCATION:  Education details: Discussed in detail designing an exercise program that does not increase his pain and avoid activities that increase pain. Person educated: Patient Education method: Explanation  ASSESSMENT:  CLINICAL IMPRESSION: Patient presented in clinic with reports of greater stiffness and soreness today with pain-level at a 6/10.  Patient able to tolerate therex with no complaints and did well with neuro re-edu activity (inverted BOSU ball in parallel bars).  Normal modalities response noted following removal of the modalities.  OBJECTIVE IMPAIRMENTS: decreased activity tolerance and pain.   ACTIVITY LIMITATIONS: bending and squatting  PARTICIPATION LIMITATIONS: yard work  [employer] POTENTIAL: Good  CLINICAL DECISION MAKING: Stable/uncomplicated  EVALUATION COMPLEXITY: Low  GOALS: LONG TERM GOALS: Target date: 11/18/22.  Ind with a HEP. Goal status: INITIAL  2.  Perform ADL's with pain not > 4/10. Goal status: INITIAL  3.  Perform a reciprocating stair gait with one railing and pain not > 4/10. Goal status: INITIAL  PLAN:  PT FREQUENCY: 2x/week  PT DURATION: 4 weeks  PLANNED INTERVENTIONS: Therapeutic exercises, Therapeutic activity, Gait training, Patient/Family education, Self Care, Electrical stimulation, Cryotherapy, Moist heat, Vasopneumatic device, and Manual therapy, Iontophoresis 4 mg/ml  PLAN FOR NEXT SESSION: O and CKC that does increase knee pain.  Recumbent bike.  Modalities as needed.  Jerell Demery, [place], PT 11/02/2022, 5:31 PM

## 2022-11-05 ENCOUNTER — Ambulatory Visit: Payer: Medicare HMO | Admitting: Physical Therapy

## 2022-11-05 DIAGNOSIS — M25562 Pain in left knee: Secondary | ICD-10-CM | POA: Diagnosis not present

## 2022-11-05 DIAGNOSIS — R293 Abnormal posture: Secondary | ICD-10-CM | POA: Diagnosis not present

## 2022-11-05 NOTE — Therapy (Signed)
OUTPATIENT PHYSICAL THERAPY LOWER EXTREMITY EVALUATION   Patient Name: Roger Guerra MRN: [id number] DOB:[birth year], 67 y.o., male Today's Date: 11/05/2022  END OF SESSION:  PT End of Session - 11/05/22 1057     Visit Number 5    Number of Visits 8    Date for PT Re-Evaluation 11/18/22    Authorization Type FOTO.    PT Start Time 1033    Activity Tolerance Patient tolerated treatment well    Behavior During Therapy WFL for tasks assessed/performed            Past Medical History:  Diagnosis Date   Anxiety disorder    Arthritis    Atherosclerosis    Back pain    Constipation    Coronary artery calcification seen on CT scan    Depression    Diabetes (HCC)    Erectile dysfunction    GERD (gastroesophageal reflux disease)    Heartburn    History of blood clots    History of DVT (deep vein thrombosis)    History of DVT of lower extremity    Right leg 2002    History of pneumonia    Hyperlipidemia    Hypogonadism in male    Hypopituitarism ([place])    Joint pain    Nerve root and plexus disorder    Obesity    Obstructive sleep apnea    OSA on CPAP    Osteoarthritis    Sciatica    Sleep apnea    SOBOE (shortness of breath on exertion)    Spinal stenosis    Spondylosis    Swelling of both lower extremities    Type 2 diabetes mellitus ([place])    Vitamin D deficiency    Past Surgical History:  Procedure Laterality Date   BLADDER SURGERY  2015   removal of skin tag near urethra   COLONOSCOPY N/A 12/10/2016   Procedure: COLONOSCOPY;  Surgeon: Daneil Dolin, MD;  [facility];  Service: Endoscopy;  Laterality: N/A;  8:30 AM   COLONOSCOPY WITH PROPOFOL N/A 04/29/2022   Procedure: COLONOSCOPY WITH PROPOFOL;  Surgeon: Daneil Dolin, MD;  [facility];  Service: Endoscopy;  Laterality: N/A;  8:00am   KNEE ARTHROSCOPY WITH MEDIAL MENISECTOMY Right 08/25/2017   Procedure: Right knee arthroscopy, partial medial menisectomy and debridement;  Surgeon:  Susa Day, MD;  [facility];  Service: Orthopedics;  Laterality: Right;  60 mins   POLYPECTOMY  12/10/2016   Procedure: POLYPECTOMY;  Surgeon: Daneil Dolin, MD;  [facility];  Service: Endoscopy;;  hepatic flexure   PTOSIS REPAIR Bilateral 02/2022   TONSILLECTOMY     Patient Active Problem List   Diagnosis Date Noted   Type 2 diabetes mellitus with hyperglycemia, without long-term current use of insulin ([place]) 05/10/2022   Cervical radiculopathy 03/01/2022   DDD (degenerative disc disease), cervical 03/01/2022   Gastroesophageal reflux disease 10/27/2021   Class 2 severe obesity with serious comorbidity and body mass index (BMI) of 36.0 to 36.9 in adult [facility]) 08/12/2021   Hypopituitarism ([place]) 05/05/2021   Hyperlipidemia associated with type 2 diabetes mellitus ([place]) 03/03/2021   Vitamin D deficiency 03/03/2021   Depression 03/03/2021   OSA on CPAP 02/17/2021   At risk for heart disease 02/17/2021   Bilateral carpal tunnel syndrome 11/04/2020   Male hypogonadism 07/24/2015   REFERRING PROVIDER: Lacie Draft PA-C  REFERRING DIAG: OA of left knee.  THERAPY DIAG:  Acute pain of left knee  Rationale  for Evaluation and Treatment: Rehabilitation  ONSET DATE: October 2023.  SUBJECTIVE:   SUBJECTIVE STATEMENT: Patient reporting more soreness and stiffness today with pain at a 5.  PERTINENT HISTORY: H/o back pain and right knee pain.  PAIN:  Are you having pain? Yes: NPRS scale: 5/10 Pain location: Left knee. Pain description: Sore, stiff. Aggravating factors: As above. Relieving factors: As above.  PRECAUTIONS: Other: Therex that does not increase pain.  PATIENT GOALS: Get knee replaced.  NEXT MD VISIT: TBD  OBJECTIVE:                                    EXERCISE LOG  Exercise Repetitions and Resistance Comments  Recumbent bike 15 minutes at level 3.   Knee ext 10# 3 minutes   Ham curls 50# 3  minutes   Leg press 3 plates  3  minutes    Inverted BOSU in parallel bars in athlete stance for neuro-re-edu. 3 minutes.       Modalities  Date: 11/05/2022 Unattended Estim: Knee, IFC, 20 mins, Pain Vaso: Knee, Low, 20 mins, Pain  PATIENT SURVEYS:  FOTO 48.  PATIENT EDUCATION:  Education details: Discussed in detail designing an exercise program that does not increase his pain and avoid activities that increase pain. Person educated: Patient Education method: Explanation  ASSESSMENT:  CLINICAL IMPRESSION: Patient's pain-level was at a 5 upon presentation to the clinic today.  He did well with treatment today.  He attempted a 4 ich lateral step-up but this reproduced his familiar pain so was not performed.  OBJECTIVE IMPAIRMENTS: decreased activity tolerance and pain.   ACTIVITY LIMITATIONS: bending and squatting  PARTICIPATION LIMITATIONS: yard work  [employer] POTENTIAL: Good  CLINICAL DECISION MAKING: Stable/uncomplicated  EVALUATION COMPLEXITY: Low  GOALS: LONG TERM GOALS: Target date: 11/18/22.  Ind with a HEP. Goal status: INITIAL  2.  Perform ADL's with pain not > 4/10. Goal status: INITIAL  3.  Perform a reciprocating stair gait with one railing and pain not > 4/10. Goal status: INITIAL  PLAN:  PT FREQUENCY: 2x/week  PT DURATION: 4 weeks  PLANNED INTERVENTIONS: Therapeutic exercises, Therapeutic activity, Gait training, Patient/Family education, Self Care, Electrical stimulation, Cryotherapy, Moist heat, Vasopneumatic device, and Manual therapy, Iontophoresis 4 mg/ml  PLAN FOR NEXT SESSION: O and CKC that does increase knee pain.  Recumbent bike.  Modalities as needed.  Yanett Conkright, [place], PT 11/05/2022, 12:37 PM

## 2022-11-09 ENCOUNTER — Ambulatory Visit: Payer: Medicare HMO | Admitting: Physical Therapy

## 2022-11-09 DIAGNOSIS — M25562 Pain in left knee: Secondary | ICD-10-CM | POA: Diagnosis not present

## 2022-11-09 DIAGNOSIS — R293 Abnormal posture: Secondary | ICD-10-CM

## 2022-11-09 NOTE — Therapy (Signed)
OUTPATIENT PHYSICAL THERAPY LOWER EXTREMITY EVALUATION   Patient Name: Roger Guerra MRN: [id number] DOB:[birth year], 67 y.o., male Today's Date: 11/09/2022  END OF SESSION:  PT End of Session - 11/09/22 1723     Visit Number 6    Number of Visits 8    Date for PT Re-Evaluation 11/18/22    Authorization Type FOTO.    PT Start Time [phone]    Activity Tolerance Patient tolerated treatment well    Behavior During Therapy WFL for tasks assessed/performed            Past Medical History:  Diagnosis Date   Anxiety disorder    Arthritis    Atherosclerosis    Back pain    Constipation    Coronary artery calcification seen on CT scan    Depression    Diabetes (HCC)    Erectile dysfunction    GERD (gastroesophageal reflux disease)    Heartburn    History of blood clots    History of DVT (deep vein thrombosis)    History of DVT of lower extremity    Right leg 2002    History of pneumonia    Hyperlipidemia    Hypogonadism in male    Hypopituitarism ([place])    Joint pain    Nerve root and plexus disorder    Obesity    Obstructive sleep apnea    OSA on CPAP    Osteoarthritis    Sciatica    Sleep apnea    SOBOE (shortness of breath on exertion)    Spinal stenosis    Spondylosis    Swelling of both lower extremities    Type 2 diabetes mellitus (HCC)    Vitamin D deficiency    Past Surgical History:  Procedure Laterality Date   BLADDER SURGERY  2015   removal of skin tag near urethra   COLONOSCOPY N/A 12/10/2016   Procedure: COLONOSCOPY;  Surgeon: Daneil Dolin, MD;  [facility];  Service: Endoscopy;  Laterality: N/A;  8:30 AM   COLONOSCOPY WITH PROPOFOL N/A 04/29/2022   Procedure: COLONOSCOPY WITH PROPOFOL;  Surgeon: Daneil Dolin, MD;  [facility];  Service: Endoscopy;  Laterality: N/A;  8:00am   KNEE ARTHROSCOPY WITH MEDIAL MENISECTOMY Right 08/25/2017   Procedure: Right knee arthroscopy, partial medial menisectomy and debridement;  Surgeon:  Susa Day, MD;  [facility];  Service: Orthopedics;  Laterality: Right;  60 mins   POLYPECTOMY  12/10/2016   Procedure: POLYPECTOMY;  Surgeon: Daneil Dolin, MD;  [facility];  Service: Endoscopy;;  hepatic flexure   PTOSIS REPAIR Bilateral 02/2022   TONSILLECTOMY     Patient Active Problem List   Diagnosis Date Noted   Type 2 diabetes mellitus with hyperglycemia, without long-term current use of insulin ([place]) 05/10/2022   Cervical radiculopathy 03/01/2022   DDD (degenerative disc disease), cervical 03/01/2022   Gastroesophageal reflux disease 10/27/2021   Class 2 severe obesity with serious comorbidity and body mass index (BMI) of 36.0 to 36.9 in adult [facility]) 08/12/2021   Hypopituitarism ([place]) 05/05/2021   Hyperlipidemia associated with type 2 diabetes mellitus ([place]) 03/03/2021   Vitamin D deficiency 03/03/2021   Depression 03/03/2021   OSA on CPAP 02/17/2021   At risk for heart disease 02/17/2021   Bilateral carpal tunnel syndrome 11/04/2020   Male hypogonadism 07/24/2015   REFERRING PROVIDER: Lacie Draft PA-C  REFERRING DIAG: OA of left knee.  THERAPY DIAG:  Acute pain of left knee  Abnormal  posture  Rationale for Evaluation and Treatment: Rehabilitation  ONSET DATE: October 2023.  SUBJECTIVE:   SUBJECTIVE STATEMENT: Patient reporting more soreness and stiffness today with pain at a 4.  PERTINENT HISTORY: H/o back pain and right knee pain.  PAIN:  Are you having pain? Yes: NPRS scale: 4/10 Pain location: Left knee. Pain description: Sore, stiff. Aggravating factors: As above. Relieving factors: As above.  PRECAUTIONS: Other: Therex that does not increase pain.  PATIENT GOALS: Get knee replaced.  NEXT MD VISIT: TBD  OBJECTIVE:                                    EXERCISE LOG  Exercise Repetitions and Resistance Comments  Nustep Progressing to level 8 x 15 minutes.   Knee ext 10# 3 minutes   Ham curls 40# 3  minutes   Leg press 3  plates  3  minutes   Inverted BOSU in parallel bars in athlete stance for neuro-re-edu. 3 minutes.       Modalities  Date: 11/05/2022 Unattended Estim: Knee, IFC, 20 mins, Pain Vaso: Knee, Low, 20 mins, Pain  PATIENT SURVEYS:  FOTO 48.  PATIENT EDUCATION:  Education details: Discussed in detail designing an exercise program that does not increase his pain and avoid activities that increase pain. Person educated: Patient Education method: Explanation  ASSESSMENT:  CLINICAL IMPRESSION: Patient's pain-level was at a 5 upon presentation to the clinic today.  He did well with treatment today.  He attempted a 4 ich lateral step-up but this reproduced his familiar pain so was not performed.  OBJECTIVE IMPAIRMENTS: decreased activity tolerance and pain.   ACTIVITY LIMITATIONS: bending and squatting  PARTICIPATION LIMITATIONS: yard work  [employer] POTENTIAL: Good  CLINICAL DECISION MAKING: Stable/uncomplicated  EVALUATION COMPLEXITY: Low  GOALS: LONG TERM GOALS: Target date: 11/18/22.  Ind with a HEP. Goal status: INITIAL  2.  Perform ADL's with pain not > 4/10. Goal status: INITIAL  3.  Perform a reciprocating stair gait with one railing and pain not > 4/10. Goal status: INITIAL  PLAN:  PT FREQUENCY: 2x/week  PT DURATION: 4 weeks  PLANNED INTERVENTIONS: Therapeutic exercises, Therapeutic activity, Gait training, Patient/Family education, Self Care, Electrical stimulation, Cryotherapy, Moist heat, Vasopneumatic device, and Manual therapy, Iontophoresis 4 mg/ml  PLAN FOR NEXT SESSION: O and CKC that does increase knee pain.  Recumbent bike.  Modalities as needed.  Isaack Preble, [place], PT 11/09/2022, 5:43 PM

## 2022-11-10 NOTE — Telephone Encounter (Signed)
We can recheck HGBA1c in 2 weeks. And see if below 8. Last one was 8.8%

## 2022-11-11 ENCOUNTER — Ambulatory Visit: Payer: Medicare HMO | Admitting: Nurse Practitioner

## 2022-11-12 ENCOUNTER — Ambulatory Visit: Payer: Medicare HMO | Admitting: *Deleted

## 2022-11-12 ENCOUNTER — Encounter: Payer: Self-pay | Admitting: *Deleted

## 2022-11-12 DIAGNOSIS — M25562 Pain in left knee: Secondary | ICD-10-CM

## 2022-11-12 DIAGNOSIS — R293 Abnormal posture: Secondary | ICD-10-CM | POA: Diagnosis not present

## 2022-11-12 NOTE — Therapy (Signed)
OUTPATIENT PHYSICAL THERAPY LOWER EXTREMITY    Patient Name: Roger Guerra MRN: [id number] DOB:1955-11-18, 67 y.o., male Today's Date: 11/12/2022  END OF SESSION:  PT End of Session - 11/12/22 1034     Visit Number 7    Number of Visits 8    Date for PT Re-Evaluation 11/18/22    Authorization Type FOTO.    PT Start Time 1030    PT Stop Time 1119    PT Time Calculation (min) 49 min            Past Medical History:  Diagnosis Date   Anxiety disorder    Arthritis    Atherosclerosis    Back pain    Constipation    Coronary artery calcification seen on CT scan    Depression    Diabetes (HCC)    Erectile dysfunction    GERD (gastroesophageal reflux disease)    Heartburn    History of blood clots    History of DVT (deep vein thrombosis)    History of DVT of lower extremity    Right leg 2002    History of pneumonia    Hyperlipidemia    Hypogonadism in male    Hypopituitarism ([place])    Joint pain    Nerve root and plexus disorder    Obesity    Obstructive sleep apnea    OSA on CPAP    Osteoarthritis    Sciatica    Sleep apnea    SOBOE (shortness of breath on exertion)    Spinal stenosis    Spondylosis    Swelling of both lower extremities    Type 2 diabetes mellitus ([place])    Vitamin D deficiency    Past Surgical History:  Procedure Laterality Date   BLADDER SURGERY  2015   removal of skin tag near urethra   COLONOSCOPY N/A 12/10/2016   Procedure: COLONOSCOPY;  Surgeon: Daneil Dolin, MD;  [facility];  Service: Endoscopy;  Laterality: N/A;  8:30 AM   COLONOSCOPY WITH PROPOFOL N/A 04/29/2022   Procedure: COLONOSCOPY WITH PROPOFOL;  Surgeon: Daneil Dolin, MD;  [facility];  Service: Endoscopy;  Laterality: N/A;  8:00am   KNEE ARTHROSCOPY WITH MEDIAL MENISECTOMY Right 08/25/2017   Procedure: Right knee arthroscopy, partial medial menisectomy and debridement;  Surgeon: Susa Day, MD;  [facility];  Service: Orthopedics;   Laterality: Right;  60 mins   POLYPECTOMY  12/10/2016   Procedure: POLYPECTOMY;  Surgeon: Daneil Dolin, MD;  [facility];  Service: Endoscopy;;  hepatic flexure   PTOSIS REPAIR Bilateral 02/2022   TONSILLECTOMY     Patient Active Problem List   Diagnosis Date Noted   Type 2 diabetes mellitus with hyperglycemia, without long-term current use of insulin ([place]) 05/10/2022   Cervical radiculopathy 03/01/2022   DDD (degenerative disc disease), cervical 03/01/2022   Gastroesophageal reflux disease 10/27/2021   Class 2 severe obesity with serious comorbidity and body mass index (BMI) of 36.0 to 36.9 in adult [facility]) 08/12/2021   Hypopituitarism ([place]) 05/05/2021   Hyperlipidemia associated with type 2 diabetes mellitus ([place]) 03/03/2021   Vitamin D deficiency 03/03/2021   Depression 03/03/2021   OSA on CPAP 02/17/2021   At risk for heart disease 02/17/2021   Bilateral carpal tunnel syndrome 11/04/2020   Male hypogonadism 07/24/2015   REFERRING PROVIDER: Lacie Draft PA-C  REFERRING DIAG: OA of left knee.  THERAPY DIAG:  Acute pain of left knee  Rationale for Evaluation and  Treatment: Rehabilitation  ONSET DATE: October 2023.  SUBJECTIVE:   SUBJECTIVE STATEMENT: Patient reporting more soreness and stiffness today with pain at a 4/10. LT hip is 7/10  PERTINENT HISTORY: H/o back pain and right knee pain.  PAIN:  Are you having pain? Yes: NPRS scale: 4/10 Pain location: Left knee. Pain description: Sore, stiff. Aggravating factors: As above. Relieving factors: As above.  PRECAUTIONS: Other: Therex that does not increase pain.  PATIENT GOALS: Get knee replaced.  NEXT MD VISIT: TBD  OBJECTIVE:                                    EXERCISE LOG        11-12-22  Exercise Repetitions and Resistance Comments  Nustep Progressing to level 6 x 15 minutes.   Knee ext 10# 3 minutes   Ham curls 40# 3  minutes   Leg press 2 plates  4  minutes   Inverted BOSU in parallel  bars in athlete stance for neuro-re-edu.        Modalities  Date: 11/05/2022 Unattended Estim: Knee, IFC, 20 mins, Pain Vaso: Knee, Low, 20 mins, Pain  PATIENT SURVEYS:  FOTO 48.  PATIENT EDUCATION:  Education details: Discussed in detail designing an exercise program that does not increase his pain and avoid activities that increase pain. Person educated: Patient Education method: Explanation  ASSESSMENT:  CLINICAL IMPRESSION: FOTO complete and DC Patient's pain-level was at a 5/10 upon presentation to the clinic today.  Was able to continue with light weight [place] and CKC strengthening exs for LT LE. DC next visit.     OBJECTIVE IMPAIRMENTS: decreased activity tolerance and pain.   ACTIVITY LIMITATIONS: bending and squatting  PARTICIPATION LIMITATIONS: yard work  [employer] POTENTIAL: Good  CLINICAL DECISION MAKING: Stable/uncomplicated  EVALUATION COMPLEXITY: Low  GOALS: LONG TERM GOALS: Target date: 11/18/22.  Ind with a HEP. Goal status: On-going  2.  Perform ADL's with pain not > 4/10. Goal status: On-going  3.  Perform a reciprocating stair gait with one railing and pain not > 4/10. Goal status: On-going  PLAN:  PT FREQUENCY: 2x/week  PT DURATION: 4 weeks  PLANNED INTERVENTIONS: Therapeutic exercises, Therapeutic activity, Gait training, Patient/Family education, Self Care, Electrical stimulation, Cryotherapy, Moist heat, Vasopneumatic device, and Manual therapy, Iontophoresis 4 mg/ml  PLAN FOR NEXT SESSION: DC next visit  Donivan Thammavong,[place], PTA 11/12/2022, 12:15 PM

## 2022-11-16 ENCOUNTER — Ambulatory Visit: Payer: Medicare HMO

## 2022-11-16 DIAGNOSIS — R293 Abnormal posture: Secondary | ICD-10-CM | POA: Diagnosis not present

## 2022-11-16 DIAGNOSIS — M25562 Pain in left knee: Secondary | ICD-10-CM | POA: Diagnosis not present

## 2022-11-16 NOTE — Therapy (Signed)
OUTPATIENT PHYSICAL THERAPY LOWER EXTREMITY    Patient Name: Roger Guerra MRN: [id number] DOB:1956/03/22, 67 y.o., male Today's Date: 11/16/2022  END OF SESSION:  PT End of Session - 11/16/22 1606     Visit Number 8    Number of Visits 8    Date for PT Re-Evaluation 11/18/22    Authorization Type FOTO.    PT Start Time 1600            Past Medical History:  Diagnosis Date   Anxiety disorder    Arthritis    Atherosclerosis    Back pain    Constipation    Coronary artery calcification seen on CT scan    Depression    Diabetes (HCC)    Erectile dysfunction    GERD (gastroesophageal reflux disease)    Heartburn    History of blood clots    History of DVT (deep vein thrombosis)    History of DVT of lower extremity    Right leg 2002    History of pneumonia    Hyperlipidemia    Hypogonadism in male    Hypopituitarism ([place])    Joint pain    Nerve root and plexus disorder    Obesity    Obstructive sleep apnea    OSA on CPAP    Osteoarthritis    Sciatica    Sleep apnea    SOBOE (shortness of breath on exertion)    Spinal stenosis    Spondylosis    Swelling of both lower extremities    Type 2 diabetes mellitus ([place])    Vitamin D deficiency    Past Surgical History:  Procedure Laterality Date   BLADDER SURGERY  2015   removal of skin tag near urethra   COLONOSCOPY N/A 12/10/2016   Procedure: COLONOSCOPY;  Surgeon: Daneil Dolin, MD;  [facility];  Service: Endoscopy;  Laterality: N/A;  8:30 AM   COLONOSCOPY WITH PROPOFOL N/A 04/29/2022   Procedure: COLONOSCOPY WITH PROPOFOL;  Surgeon: Daneil Dolin, MD;  [facility];  Service: Endoscopy;  Laterality: N/A;  8:00am   KNEE ARTHROSCOPY WITH MEDIAL MENISECTOMY Right 08/25/2017   Procedure: Right knee arthroscopy, partial medial menisectomy and debridement;  Surgeon: Susa Day, MD;  Location: [facility];  Service: Orthopedics;  Laterality: Right;  60 mins   POLYPECTOMY  12/10/2016    Procedure: POLYPECTOMY;  Surgeon: Daneil Dolin, MD;  [facility];  Service: Endoscopy;;  hepatic flexure   PTOSIS REPAIR Bilateral 02/2022   TONSILLECTOMY     Patient Active Problem List   Diagnosis Date Noted   Type 2 diabetes mellitus with hyperglycemia, without long-term current use of insulin ([place]) 05/10/2022   Cervical radiculopathy 03/01/2022   DDD (degenerative disc disease), cervical 03/01/2022   Gastroesophageal reflux disease 10/27/2021   Class 2 severe obesity with serious comorbidity and body mass index (BMI) of 36.0 to 36.9 in adult [facility]) 08/12/2021   Hypopituitarism ([place]) 05/05/2021   Hyperlipidemia associated with type 2 diabetes mellitus ([place]) 03/03/2021   Vitamin D deficiency 03/03/2021   Depression 03/03/2021   OSA on CPAP 02/17/2021   At risk for heart disease 02/17/2021   Bilateral carpal tunnel syndrome 11/04/2020   Male hypogonadism 07/24/2015   REFERRING PROVIDER: Lacie Draft PA-C  REFERRING DIAG: OA of left knee.  THERAPY DIAG:  Acute pain of left knee  Rationale for Evaluation and Treatment: Rehabilitation  ONSET DATE: October 2023.  SUBJECTIVE:   SUBJECTIVE STATEMENT: Patient reporting 3/10  left knee pain today.   PERTINENT HISTORY: H/o back pain and right knee pain.  PAIN:  Are you having pain? Yes: NPRS scale: 3/10 Pain location: Left knee. Pain description: Sore, stiff. Aggravating factors: As above. Relieving factors: As above.  PRECAUTIONS: Other: Therex that does not increase pain.  PATIENT GOALS: Get knee replaced.  NEXT MD VISIT: TBD  OBJECTIVE:                                    EXERCISE LOG        11-16-22  Exercise Repetitions and Resistance Comments  Nustep Progressing to level 6 x 15 minutes.   Knee ext  10# x 4 minutes   Ham curls  40# x 4  minutes   Leg press  2 plates x 4  minutes   Inverted BOSU in parallel bars in athlete stance for neuro-re-edu.        Modalities  Date: 11/16/2022 Unattended  Estim: Knee, IFC, 15 mins, Pain Vaso: Knee, Low, 15 mins, Pain  PATIENT SURVEYS:  FOTO 48.  PATIENT EDUCATION:  Education details: Discussed in detail designing an exercise program that does not increase his pain and avoid activities that increase pain. Person educated: Patient Education method: Explanation  ASSESSMENT:  CLINICAL IMPRESSION:  Pt arrives for today's treatment session reporting 3/10 left knee pain. Pt has made progress towards his goals, but has not met them at this time.  Pt reports that his left knee feels 30% better since beginning therapy.  Pt able to tolerate increased time with all cybex exercises today with increased fatigue, but no pain.  Normal responses to estim and vaso noted upon removal.  Pt reported decrease in pain at completion of today's treatment.  OBJECTIVE IMPAIRMENTS: decreased activity tolerance and pain.   ACTIVITY LIMITATIONS: bending and squatting  PARTICIPATION LIMITATIONS: yard work  [employer] POTENTIAL: Good  CLINICAL DECISION MAKING: Stable/uncomplicated  EVALUATION COMPLEXITY: Low  GOALS: LONG TERM GOALS: Target date: 11/18/22.  Ind with a HEP. Goal status: MET  2.  Perform ADL's with pain not > 4/10. Goal status: On-going  3.  Perform a reciprocating stair gait with one railing and pain not > 4/10. Goal status: On-going  PLAN:  PT FREQUENCY: 2x/week  PT DURATION: 4 weeks  PLANNED INTERVENTIONS: Therapeutic exercises, Therapeutic activity, Gait training, Patient/Family education, Self Care, Electrical stimulation, Cryotherapy, Moist heat, Vasopneumatic device, and Manual therapy, Iontophoresis 4 mg/ml  PLAN FOR NEXT SESSION: DC next visit  Kathrynn Ducking, PTA 11/16/2022, 4:06 PM

## 2022-11-25 ENCOUNTER — Encounter: Payer: Self-pay | Admitting: Pharmacist

## 2022-11-25 DIAGNOSIS — G720 Drug-induced myopathy: Secondary | ICD-10-CM

## 2022-11-25 NOTE — Progress Notes (Unsigned)
Error

## 2022-11-25 NOTE — Progress Notes (Signed)
[place] Team Statin Quality Measure Assessment   11/25/2022  Roger Guerra 07-04-1956 [id number]  Per review of chart and payor information, patient has a diagnosis of diabetes but is not currently filling a statin prescription.  This places patient into the Statin Use In Patients with Diabetes (SUPD) measure for CMS.    Patient has documented trials of statins with reported myopathy, but no corresponding CPT codes that would exclude patient from SUPD measure.   Patient was recently started on Repatha by Cardiology.  If a statin exclusion code is associated with the upcoming visit, it will remove the patient from the measure.  05/11/2022     Component Value Date/Time   CHOL 99 (L) 05/11/2022 1018   TRIG 81 05/11/2022 1018   HDL 34 (L) 05/11/2022 1018   CHOLHDL 2.9 05/11/2022 1018   LDLCALC 48 05/11/2022 1018    Please consider coding for statin intolerance at the upcoming visit.   Code for past statin intolerance or  other exclusions (required annually)  Provider Requirements: Associate code during an office visit or telehealth encounter  Drug Induced Myopathy G72.0   Myopathy, unspecified G72.9   Myositis, unspecified M60.9   Rhabdomyolysis M62.82   Cirrhosis of liver K74.69   Prediabetes R73.03   PCOS E28.2   Plan:  Route note to Provider prior to the upcoming visit.  Elayne Guerin, PharmD, [place] Clinical Pharmacist [phone]

## 2022-11-26 ENCOUNTER — Ambulatory Visit (INDEPENDENT_AMBULATORY_CARE_PROVIDER_SITE_OTHER): Payer: Medicare HMO | Admitting: Nurse Practitioner

## 2022-11-26 ENCOUNTER — Encounter: Payer: Self-pay | Admitting: Nurse Practitioner

## 2022-11-26 VITALS — BP 136/87 | HR 71 | Temp 97.8°F | Resp 20 | Ht 71.0 in | Wt 258.0 lb

## 2022-11-26 DIAGNOSIS — Z6836 Body mass index (BMI) 36.0-36.9, adult: Secondary | ICD-10-CM | POA: Diagnosis not present

## 2022-11-26 DIAGNOSIS — E785 Hyperlipidemia, unspecified: Secondary | ICD-10-CM

## 2022-11-26 DIAGNOSIS — E559 Vitamin D deficiency, unspecified: Secondary | ICD-10-CM | POA: Diagnosis not present

## 2022-11-26 DIAGNOSIS — E1169 Type 2 diabetes mellitus with other specified complication: Secondary | ICD-10-CM | POA: Diagnosis not present

## 2022-11-26 DIAGNOSIS — K219 Gastro-esophageal reflux disease without esophagitis: Secondary | ICD-10-CM

## 2022-11-26 DIAGNOSIS — G4733 Obstructive sleep apnea (adult) (pediatric): Secondary | ICD-10-CM

## 2022-11-26 DIAGNOSIS — E1165 Type 2 diabetes mellitus with hyperglycemia: Secondary | ICD-10-CM | POA: Diagnosis not present

## 2022-11-26 DIAGNOSIS — E230 Hypopituitarism: Secondary | ICD-10-CM

## 2022-11-26 DIAGNOSIS — F3289 Other specified depressive episodes: Secondary | ICD-10-CM

## 2022-11-26 DIAGNOSIS — R69 Illness, unspecified: Secondary | ICD-10-CM | POA: Diagnosis not present

## 2022-11-26 DIAGNOSIS — E291 Testicular hypofunction: Secondary | ICD-10-CM

## 2022-11-26 LAB — BAYER DCA HB A1C WAIVED: HB A1C (BAYER DCA - WAIVED): 9.3 % — ABNORMAL HIGH (ref 4.8–5.6)

## 2022-11-26 MED ORDER — LANTUS SOLOSTAR 100 UNIT/ML ~~LOC~~ SOPN: 20.0000 [IU] | PEN_INJECTOR | Freq: Every day | SUBCUTANEOUS | 99 refills | Status: DC

## 2022-11-26 NOTE — Patient Instructions (Signed)

## 2022-11-26 NOTE — Progress Notes (Signed)
Subjective:    Patient ID: Roger Guerra, male    DOB: 05/27/1956, 67 y.o.   MRN: [id number]   Chief Complaint: medical management of chronic issues     HPI:  Roger Guerra is a 67 y.o. who identifies as a male who was assigned male at birth.   Social history: Lives with: wife Work history: retired   Scientist, forensic in today for follow up of the following chronic medical issues:  1. Hyperlipidemia associated with type 2 diabetes mellitus ([place]) Does not wtahc diet and does very little exercise. Lab Results  Component Value Date   CHOL 99 (L) 05/11/2022   HDL 34 (L) 05/11/2022   LDLCALC 48 05/11/2022   TRIG 81 05/11/2022   CHOLHDL 2.9 05/11/2022     2. Type 2 diabetes mellitus with hyperglycemia, without long-term current use of insulin (HCC) Fasting blood sugars are running around 180 fasting. He needs to have surgery and hgba1c must be below 8. Lab Results  Component Value Date   HGBA1C 8.8 (H) 10/28/2022     3. Gastroesophageal reflux disease, unspecified whether esophagitis present Is on omeprazole daily and is doing well.  4. Male hypogonadism Lab Results  Component Value Date   TESTOSTERONE 592 06/16/2021     5. OSA on CPAP Wears cpap nightly- feels rested in mornings.  6. Hypopituitarism ([place]) No issue sthat he is aware of  7. Other depression Doing well currently    11/26/2022    2:35 PM 10/28/2022    3:07 PM 10/25/2022    3:37 PM  Depression screen PHQ 2/9  Decreased Interest 0 0 0  Down, Depressed, Hopeless 0 0 0  PHQ - 2 Score 0 0 0  Altered sleeping 0 0   Tired, decreased energy 0 0   Change in appetite 0 0   Feeling bad or failure about yourself  0 0   Trouble concentrating 0 0   Moving slowly or fidgety/restless 0 0   Suicidal thoughts 0 0   PHQ-9 Score 0 0   Difficult doing work/chores Not difficult at all Not difficult at all      8. Vitamin D deficiency Is on a daily vitamin d supplement Last vitamin D Lab Results  Component  Value Date   VD25OH 33.6 10/06/2021     9. Class 2 severe obesity with serious comorbidity and body mass index (BMI) of 36.0 to 36.9 in adult, unspecified obesity type ([place]) No recent weight changes Wt Readings from Last 3 Encounters:  11/26/22 258 lb (117 kg)  10/28/22 259 lb (117.5 kg)  10/25/22 256 lb (116.1 kg)   BMI Readings from Last 3 Encounters:  11/26/22 35.98 kg/m  10/28/22 36.12 kg/m  10/25/22 35.70 kg/m      New complaints: None today  Allergies  Allergen Reactions   Invokana [Canagliflozin] Other (See Comments)    Yeast infection   Statins Other (See Comments)    Severe muscle cramps   Outpatient Encounter Medications as of 11/26/2022  Medication Sig   5-Hydroxytryptophan (5-HTP PO) Take 150 mg by mouth daily.   AMBULATORY NON FORMULARY MEDICATION Medication Name: Trimix  PGE 30 mcg Pap 30 mg Phent 1 mg (Patient taking differently: daily as needed (ED). Medication Name: Trimix  PGE 30 mcg Pap 30 mg Phent 1 mg)   aspirin 81 MG tablet Take 81 mg by mouth daily.   carisoprodol (SOMA) 350 MG tablet Take 1 tablet (350 mg total) by mouth 3 (three) times daily.  cetirizine (ZYRTEC) 10 MG tablet Take 10 mg by mouth every evening.    Cholecalciferol (VITAMIN D3 PO) Take 1 tablet by mouth daily.   clotrimazole-betamethasone (LOTRISONE) cream Apply 1 Application topically 2 (two) times daily.   co-enzyme Q-10 30 MG capsule Take 30 mg by mouth daily.   Evolocumab (REPATHA SURECLICK) [id number] MG/ML SOAJ Inject 140 mg into the skin every 14 (fourteen) days.   ezetimibe (ZETIA) 10 MG tablet Take 1 tablet (10 mg total) by mouth daily.   fexofenadine (ALLEGRA) 180 MG tablet Take 180 mg by mouth every morning.    finasteride (PROSCAR) 5 MG tablet Take 1 tablet (5 mg total) by mouth daily.   fluconazole (DIFLUCAN) 150 MG tablet Take one tablet by mouth now. Repeat in 3 days.   glipiZIDE (GLUCOTROL) 10 MG tablet Take 1 tablet (10 mg total) by mouth 2 (two) times daily  before a meal.   glucose blood (ONETOUCH ULTRA) test strip check blood sugar once daily. DX:E11.69   hydrocortisone cream 1 % Apply to affected area 2 times daily   ibuprofen (ADVIL) 800 MG tablet TAKE (1) TABLET BY MOUTH IN THE MORNING AND AT BEDTIME.   magnesium oxide (MAG-OX) 400 (240 Mg) MG tablet Take 400 mg by mouth daily.   Multiple Vitamin (MULTIVITAMIN WITH MINERALS) TABS tablet Take 1 tablet by mouth daily.   naproxen sodium (ALEVE) 220 MG tablet Take 220 mg by mouth daily as needed (pain).   Omega-3 Fatty Acids (FISH OIL) 1000 MG CAPS Take 1,000 Units by mouth daily.   omeprazole (PRILOSEC OTC) 20 MG tablet Take 1 tablet (20 mg total) by mouth daily as needed (acid reflux).   ondansetron (ZOFRAN) 4 MG tablet Take 1 tablet (4 mg total) by mouth every 8 (eight) hours as needed for nausea or vomiting.   oxyCODONE-acetaminophen (PERCOCET/ROXICET) 5-325 MG tablet Take 1 tablet by mouth 2 (two) times daily as needed for moderate pain.   Scar Treatment Products (MEDERMA) GEL Apply 1 Application topically 2 (two) times daily.   Semaglutide (RYBELSUS) 3 MG TABS Take 3 mg by mouth daily.   silodosin (RAPAFLO) 8 MG CAPS capsule Take 1 capsule (8 mg total) by mouth in the morning and at bedtime.   sodium chloride (OCEAN) 0.65 % SOLN nasal spray Place 1 spray into both nostrils 2 (two) times daily.   SYRINGE-NEEDLE, DISP, 3 ML (LUER LOCK SAFETY SYRINGES) 22G X 1" 3 ML MISC For testosterone  injections   vitamin E 400 UNIT capsule Take 400 Units by mouth daily.   zinc gluconate 50 MG tablet Take 50 mg by mouth daily.   No facility-administered encounter medications on file as of 11/26/2022.    Past Surgical History:  Procedure Laterality Date   BLADDER SURGERY  2015   removal of skin tag near urethra   COLONOSCOPY N/A 12/10/2016   Procedure: COLONOSCOPY;  Surgeon: Daneil Dolin, MD;  [facility];  Service: Endoscopy;  Laterality: N/A;  8:30 AM   COLONOSCOPY WITH PROPOFOL N/A  04/29/2022   Procedure: COLONOSCOPY WITH PROPOFOL;  Surgeon: Daneil Dolin, MD;  [facility];  Service: Endoscopy;  Laterality: N/A;  8:00am   KNEE ARTHROSCOPY WITH MEDIAL MENISECTOMY Right 08/25/2017   Procedure: Right knee arthroscopy, partial medial menisectomy and debridement;  Surgeon: Susa Day, MD;  [facility];  Service: Orthopedics;  Laterality: Right;  60 mins   POLYPECTOMY  12/10/2016   Procedure: POLYPECTOMY;  Surgeon: Daneil Dolin, MD;  [facility];  Service: Endoscopy;;  hepatic flexure   PTOSIS REPAIR Bilateral 02/2022   TONSILLECTOMY      Family History  Problem Relation Age of Onset   Diabetes Mother    Lung cancer Father    Heart disease Father    Stroke Father    Cancer Father    Diabetes Sister    Diabetes Other        Siblings      Controlled substance contract: n/a     Review of Systems  Constitutional:  Negative for diaphoresis.  Eyes:  Negative for pain.  Respiratory:  Negative for shortness of breath.   Cardiovascular:  Negative for chest pain, palpitations and leg swelling.  Gastrointestinal:  Negative for abdominal pain.  Endocrine: Negative for polydipsia.  Skin:  Negative for rash.  Neurological:  Negative for dizziness, weakness and headaches.  Hematological:  Does not bruise/bleed easily.  All other systems reviewed and are negative.      Objective:   Physical Exam Vitals and nursing note reviewed.  Constitutional:      Appearance: Normal appearance. He is well-developed.  HENT:     Head: Normocephalic.     Nose: Nose normal.     Mouth/Throat:     Mouth: Mucous membranes are moist.     Pharynx: Oropharynx is clear.  Eyes:     Pupils: Pupils are equal, round, and reactive to light.  Neck:     Thyroid: No thyroid mass or thyromegaly.     Vascular: No carotid bruit or JVD.     Trachea: Phonation normal.  Cardiovascular:     Rate and Rhythm: Normal rate and regular rhythm.  Pulmonary:      Effort: Pulmonary effort is normal. No respiratory distress.     Breath sounds: Normal breath sounds.  Abdominal:     General: Bowel sounds are normal.     Palpations: Abdomen is soft.     Tenderness: There is no abdominal tenderness.  Musculoskeletal:        General: Normal range of motion.     Cervical back: Normal range of motion and neck supple.  Lymphadenopathy:     Cervical: No cervical adenopathy.  Skin:    General: Skin is warm and dry.  Neurological:     Mental Status: He is alert and oriented to person, place, and time.  Psychiatric:        Behavior: Behavior normal.        Thought Content: Thought content normal.        Judgment: Judgment normal.    BP 136/87   Pulse 71   Temp 97.8 F (36.6 C) (Temporal)   Resp 20   Ht '5\' 11"'$  (1.803 m)   Wt 258 lb (117 kg)   SpO2 94%   BMI 35.98 kg/m   Hgba1c 9.3%       Assessment & Plan:   Roger Guerra comes in today with chief complaint of Medical Management of Chronic Issues   Diagnosis and orders addressed:  1. Hyperlipidemia associated with type 2 diabetes mellitus (HCC) Low fat diet - CBC with Differential/Platelet - CMP14+EGFR - Lipid panel  2. Type 2 diabetes mellitus with other specified complication, without long-term current use of insulin (HCC) Stricter carb couting Added lantus 20u daily- is fasting blood sugars stay above 150 nede to call for lantus adjustment. - Bayer DCA Hb A1c Waived - insulin glargine (LANTUS SOLOSTAR) 100 UNIT/ML Solostar Pen; Inject 20 Units into the skin  daily.  Dispense: 15 mL; Refill: PRN  3. Gastroesophageal reflux disease, unspecified whether esophagitis present Avoid spicy foods Do not eat 2 hours prior to bedtime   4. Male hypogonadism  5. OSA on CPAP Continue to wear CPAP  6. Hypopituitarism ([place])  7. Other depression Stress management  8. Vitamin D deficiency Continue vitamin d supplement - VITAMIN D 25 Hydroxy (Vit-D Deficiency, Fractures)  9. Class  2 severe obesity with serious comorbidity and body mass index (BMI) of 36.0 to 36.9 in adult, unspecified obesity type ([place]) Discussed diet and exercise for person with BMI >25 Will recheck weight in 3-6 months    Labs pending Health Maintenance reviewed Diet and exercise encouraged  Follow up plan: 1 month   [place], FNP

## 2022-11-27 LAB — CBC WITH DIFFERENTIAL/PLATELET
Basophils Absolute: 0 10*3/uL (ref 0.0–0.2)
Basos: 1 %
EOS (ABSOLUTE): 0.2 10*3/uL (ref 0.0–0.4)
Eos: 2 %
Hematocrit: 49.8 % (ref 37.5–51.0)
Hemoglobin: 17.2 g/dL (ref 13.0–17.7)
Immature Grans (Abs): 0.1 10*3/uL (ref 0.0–0.1)
Immature Granulocytes: 1 %
Lymphocytes Absolute: 3.3 10*3/uL — ABNORMAL HIGH (ref 0.7–3.1)
Lymphs: 41 %
MCH: 31.7 pg (ref 26.6–33.0)
MCHC: 34.5 g/dL (ref 31.5–35.7)
MCV: 92 fL (ref 79–97)
Monocytes Absolute: 0.9 10*3/uL (ref 0.1–0.9)
Monocytes: 11 %
Neutrophils Absolute: 3.5 10*3/uL (ref 1.4–7.0)
Neutrophils: 44 %
Platelets: 276 10*3/uL (ref 150–450)
RBC: 5.42 x10E6/uL (ref 4.14–5.80)
RDW: 12.6 % (ref 11.6–15.4)
WBC: 7.9 10*3/uL (ref 3.4–10.8)

## 2022-11-27 LAB — LIPID PANEL
Chol/HDL Ratio: 2.6 ratio (ref 0.0–5.0)
Cholesterol, Total: 87 mg/dL — ABNORMAL LOW (ref 100–199)
HDL: 33 mg/dL — ABNORMAL LOW (ref >39)
LDL Chol Calc (NIH): 24 mg/dL (ref 0–99)
Triglycerides: 183 mg/dL — ABNORMAL HIGH (ref 0–149)
VLDL Cholesterol Cal: 30 mg/dL (ref 5–40)

## 2022-11-27 LAB — CMP14+EGFR
ALT: 36 IU/L (ref 0–44)
AST: 27 IU/L (ref 0–40)
Albumin/Globulin Ratio: 2.1 (ref 1.2–2.2)
Albumin: 4.7 g/dL (ref 3.9–4.9)
Alkaline Phosphatase: 49 IU/L (ref 44–121)
BUN/Creatinine Ratio: 14 (ref 10–24)
BUN: 12 mg/dL (ref 8–27)
Bilirubin Total: 0.3 mg/dL (ref 0.0–1.2)
CO2: 21 mmol/L (ref 20–29)
Calcium: 9.9 mg/dL (ref 8.6–10.2)
Chloride: 104 mmol/L (ref 96–106)
Creatinine, Ser: 0.86 mg/dL (ref 0.76–1.27)
Globulin, Total: 2.2 g/dL (ref 1.5–4.5)
Glucose: 174 mg/dL — ABNORMAL HIGH (ref 70–99)
Potassium: 4.4 mmol/L (ref 3.5–5.2)
Sodium: 141 mmol/L (ref 134–144)
Total Protein: 6.9 g/dL (ref 6.0–8.5)
eGFR: 95 mL/min/{1.73_m2} (ref >59)

## 2022-11-27 LAB — VITAMIN D 25 HYDROXY (VIT D DEFICIENCY, FRACTURES): Vit D, 25-Hydroxy: 58.4 ng/mL (ref 30.0–100.0)

## 2022-11-29 MED ORDER — PEN NEEDLES 31G X 6 MM MISC: 1.0000 | Freq: Every day | 1 refills | Status: AC

## 2022-11-29 MED ORDER — GLIPIZIDE 10 MG PO TABS: 10.0000 mg | ORAL_TABLET | Freq: Two times a day (BID) | ORAL | 3 refills | Status: DC

## 2022-12-06 DIAGNOSIS — E1169 Type 2 diabetes mellitus with other specified complication: Secondary | ICD-10-CM

## 2022-12-06 MED ORDER — LANTUS SOLOSTAR 100 UNIT/ML ~~LOC~~ SOPN: 28.0000 [IU] | PEN_INJECTOR | Freq: Every day | SUBCUTANEOUS | 99 refills | Status: DC

## 2022-12-16 DIAGNOSIS — E119 Type 2 diabetes mellitus without complications: Secondary | ICD-10-CM | POA: Diagnosis not present

## 2022-12-16 DIAGNOSIS — H35371 Puckering of macula, right eye: Secondary | ICD-10-CM | POA: Diagnosis not present

## 2022-12-16 DIAGNOSIS — H2513 Age-related nuclear cataract, bilateral: Secondary | ICD-10-CM | POA: Diagnosis not present

## 2022-12-16 LAB — HM DIABETES EYE EXAM

## 2022-12-21 ENCOUNTER — Encounter: Payer: Self-pay | Admitting: Nurse Practitioner

## 2022-12-21 ENCOUNTER — Ambulatory Visit (INDEPENDENT_AMBULATORY_CARE_PROVIDER_SITE_OTHER): Payer: Medicare HMO | Admitting: Nurse Practitioner

## 2022-12-21 VITALS — BP 137/87 | HR 79 | Temp 97.9°F | Resp 20 | Ht 71.0 in | Wt 263.0 lb

## 2022-12-21 DIAGNOSIS — E1169 Type 2 diabetes mellitus with other specified complication: Secondary | ICD-10-CM

## 2022-12-21 DIAGNOSIS — E559 Vitamin D deficiency, unspecified: Secondary | ICD-10-CM

## 2022-12-21 DIAGNOSIS — K219 Gastro-esophageal reflux disease without esophagitis: Secondary | ICD-10-CM

## 2022-12-21 DIAGNOSIS — M5030 Other cervical disc degeneration, unspecified cervical region: Secondary | ICD-10-CM

## 2022-12-21 DIAGNOSIS — R69 Illness, unspecified: Secondary | ICD-10-CM | POA: Diagnosis not present

## 2022-12-21 DIAGNOSIS — Z9189 Other specified personal risk factors, not elsewhere classified: Secondary | ICD-10-CM

## 2022-12-21 DIAGNOSIS — F3289 Other specified depressive episodes: Secondary | ICD-10-CM | POA: Diagnosis not present

## 2022-12-21 DIAGNOSIS — Z6836 Body mass index (BMI) 36.0-36.9, adult: Secondary | ICD-10-CM | POA: Diagnosis not present

## 2022-12-21 DIAGNOSIS — E291 Testicular hypofunction: Secondary | ICD-10-CM

## 2022-12-21 DIAGNOSIS — G4733 Obstructive sleep apnea (adult) (pediatric): Secondary | ICD-10-CM

## 2022-12-21 DIAGNOSIS — E230 Hypopituitarism: Secondary | ICD-10-CM | POA: Diagnosis not present

## 2022-12-21 DIAGNOSIS — E1165 Type 2 diabetes mellitus with hyperglycemia: Secondary | ICD-10-CM

## 2022-12-21 DIAGNOSIS — E785 Hyperlipidemia, unspecified: Secondary | ICD-10-CM | POA: Diagnosis not present

## 2022-12-21 LAB — BAYER DCA HB A1C WAIVED: HB A1C (BAYER DCA - WAIVED): 8.4 % — ABNORMAL HIGH (ref 4.8–5.6)

## 2022-12-21 MED ORDER — GLIPIZIDE 10 MG PO TABS: 10.0000 mg | ORAL_TABLET | Freq: Two times a day (BID) | ORAL | 1 refills | Status: DC

## 2022-12-21 MED ORDER — EZETIMIBE 10 MG PO TABS: 10.0000 mg | ORAL_TABLET | Freq: Every day | ORAL | 1 refills | Status: DC

## 2022-12-21 MED ORDER — LANTUS SOLOSTAR 100 UNIT/ML ~~LOC~~ SOPN: 35.0000 [IU] | PEN_INJECTOR | Freq: Every day | SUBCUTANEOUS | 99 refills | Status: DC

## 2022-12-21 MED ORDER — TRULICITY 3 MG/0.5ML ~~LOC~~ SOAJ: 3.0000 mg | SUBCUTANEOUS | 5 refills | Status: DC

## 2022-12-21 MED ORDER — CARISOPRODOL 350 MG PO TABS: 350.0000 mg | ORAL_TABLET | Freq: Three times a day (TID) | ORAL | 2 refills | Status: DC

## 2022-12-21 MED ORDER — OMEPRAZOLE MAGNESIUM 20 MG PO TBEC: 20.0000 mg | DELAYED_RELEASE_TABLET | Freq: Every day | ORAL | 1 refills | Status: DC | PRN

## 2022-12-21 MED ORDER — FINASTERIDE 5 MG PO TABS: 5.0000 mg | ORAL_TABLET | Freq: Every day | ORAL | 1 refills | Status: DC

## 2022-12-21 NOTE — Progress Notes (Signed)
Subjective:    Patient ID: Roger Guerra, male    DOB: 07/24/1956, 67 y.o.   MRN: [id number]   Chief Complaint: medical management of chronic issues     HPI:  Roger Guerra is a 67 y.o. who identifies as a male who was assigned male at birth.   Social history: Lives with: wife Work history: retired   Scientist, forensic in today for follow up of the following chronic medical issues:  1. Hyperlipidemia associated with type 2 diabetes mellitus ([place]) Does try to watch diet. Does not do much exercise due to chronic knee pain. Lab Results  Component Value Date   CHOL 87 (L) 11/26/2022   HDL 33 (L) 11/26/2022   LDLCALC 24 11/26/2022   TRIG 183 (H) 11/26/2022   CHOLHDL 2.6 11/26/2022     2. Type 2 diabetes mellitus with hyperglycemia, without long-term current use of insulin (HCC) He has been trying to get his blood sugars under control so he v=can have total knee replacement. His hgba1c has been coming down, but last check was still not low enough for surgery. Blood sugars are still 190-210. Lab Results  Component Value Date   HGBA1C 9.3 (H) 11/26/2022     3. Hypopituitarism ([place]) No issues that he is aware of  4. Male hypogonadism Lab Results  Component Value Date   TESTOSTERONE 592 06/16/2021     5. Other depression Is currently on no antidepressants    12/21/2022   12:00 PM 11/26/2022    2:35 PM 10/28/2022    3:07 PM  Depression screen PHQ 2/9  Decreased Interest 0 0 0  Down, Depressed, Hopeless 0 0 0  PHQ - 2 Score 0 0 0  Altered sleeping 0 0 0  Tired, decreased energy 0 0 0  Change in appetite 0 0 0  Feeling bad or failure about yourself  0 0 0  Trouble concentrating 0 0 0  Moving slowly or fidgety/restless 0 0 0  Suicidal thoughts 0 0 0  PHQ-9 Score 0 0 0  Difficult doing work/chores Not difficult at all Not difficult at all Not difficult at all     6. Vitamin D deficiency Is on daily vitamin d supplement  7. At risk for heart disease  8.  Gastroesophageal reflux disease, unspecified whether esophagitis present Is on omeprazole and is doing well.  9. OSA on CPAP Wears CPAP nightly  10. Class 2 severe obesity with serious comorbidity and body mass index (BMI) of 36.0 to 36.9 in adult, unspecified obesity type ([place]) No recent weight changes Wt Readings from Last 3 Encounters:  12/21/22 263 lb (119.3 kg)  11/26/22 258 lb (117 kg)  10/28/22 259 lb (117.5 kg)   BMI Readings from Last 3 Encounters:  12/21/22 36.68 kg/m  11/26/22 35.98 kg/m  10/28/22 36.12 kg/m     New complaints: None today  Allergies  Allergen Reactions   Invokana [Canagliflozin] Other (See Comments)    Yeast infection   Statins Other (See Comments)    Severe muscle cramps   Outpatient Encounter Medications as of 12/21/2022  Medication Sig   5-Hydroxytryptophan (5-HTP PO) Take 150 mg by mouth daily.   AMBULATORY NON FORMULARY MEDICATION Medication Name: Trimix  PGE 30 mcg Pap 30 mg Phent 1 mg (Patient taking differently: daily as needed (ED). Medication Name: Trimix  PGE 30 mcg Pap 30 mg Phent 1 mg)   aspirin 81 MG tablet Take 81 mg by mouth daily.   carisoprodol (SOMA) 350 MG tablet  Take 1 tablet (350 mg total) by mouth 3 (three) times daily.   cetirizine (ZYRTEC) 10 MG tablet Take 10 mg by mouth every evening.    Cholecalciferol (VITAMIN D3 PO) Take 1 tablet by mouth daily.   clotrimazole-betamethasone (LOTRISONE) cream Apply 1 Application topically 2 (two) times daily.   co-enzyme Q-10 30 MG capsule Take 30 mg by mouth daily.   Evolocumab (REPATHA SURECLICK) [id number] MG/ML SOAJ Inject 140 mg into the skin every 14 (fourteen) days.   ezetimibe (ZETIA) 10 MG tablet Take 1 tablet (10 mg total) by mouth daily.   fexofenadine (ALLEGRA) 180 MG tablet Take 180 mg by mouth every morning.    finasteride (PROSCAR) 5 MG tablet Take 1 tablet (5 mg total) by mouth daily.   glipiZIDE (GLUCOTROL) 10 MG tablet Take 1 tablet (10 mg total) by mouth 2 (two)  times daily before a meal.   glucose blood (ONETOUCH ULTRA) test strip check blood sugar once daily. DX:E11.69   hydrocortisone cream 1 % Apply to affected area 2 times daily   ibuprofen (ADVIL) 800 MG tablet TAKE (1) TABLET BY MOUTH IN THE MORNING AND AT BEDTIME.   insulin glargine (LANTUS SOLOSTAR) 100 UNIT/ML Solostar Pen Inject 28 Units into the skin daily.   Insulin Pen Needle (PEN NEEDLES) 31G X 6 MM MISC 1 each by Does not apply route daily.   magnesium oxide (MAG-OX) 400 (240 Mg) MG tablet Take 400 mg by mouth daily.   Multiple Vitamin (MULTIVITAMIN WITH MINERALS) TABS tablet Take 1 tablet by mouth daily.   naproxen sodium (ALEVE) 220 MG tablet Take 220 mg by mouth daily as needed (pain).   Omega-3 Fatty Acids (FISH OIL) 1000 MG CAPS Take 1,000 Units by mouth daily.   omeprazole (PRILOSEC OTC) 20 MG tablet Take 1 tablet (20 mg total) by mouth daily as needed (acid reflux).   ondansetron (ZOFRAN) 4 MG tablet Take 1 tablet (4 mg total) by mouth every 8 (eight) hours as needed for nausea or vomiting.   oxyCODONE-acetaminophen (PERCOCET/ROXICET) 5-325 MG tablet Take 1 tablet by mouth 2 (two) times daily as needed for moderate pain.   Scar Treatment Products (MEDERMA) GEL Apply 1 Application topically 2 (two) times daily.   Semaglutide (RYBELSUS) 3 MG TABS Take 3 mg by mouth daily.   silodosin (RAPAFLO) 8 MG CAPS capsule Take 1 capsule (8 mg total) by mouth in the morning and at bedtime.   sodium chloride (OCEAN) 0.65 % SOLN nasal spray Place 1 spray into both nostrils 2 (two) times daily.   SYRINGE-NEEDLE, DISP, 3 ML (LUER LOCK SAFETY SYRINGES) 22G X 1" 3 ML MISC For testosterone  injections   vitamin E 400 UNIT capsule Take 400 Units by mouth daily.   zinc gluconate 50 MG tablet Take 50 mg by mouth daily.   No facility-administered encounter medications on file as of 12/21/2022.    Past Surgical History:  Procedure Laterality Date   BLADDER SURGERY  2015   removal of skin tag near  urethra   COLONOSCOPY N/A 12/10/2016   Procedure: COLONOSCOPY;  Surgeon: Daneil Dolin, MD;  Location: [facility];  Service: Endoscopy;  Laterality: N/A;  8:30 AM   COLONOSCOPY WITH PROPOFOL N/A 04/29/2022   Procedure: COLONOSCOPY WITH PROPOFOL;  Surgeon: Daneil Dolin, MD;  Location: [facility];  Service: Endoscopy;  Laterality: N/A;  8:00am   KNEE ARTHROSCOPY WITH MEDIAL MENISECTOMY Right 08/25/2017   Procedure: Right knee arthroscopy, partial medial menisectomy and debridement;  Surgeon: Susa Day, MD;  Location: [facility];  Service: Orthopedics;  Laterality: Right;  60 mins   POLYPECTOMY  12/10/2016   Procedure: POLYPECTOMY;  Surgeon: Daneil Dolin, MD;  Location: [facility];  Service: Endoscopy;;  hepatic flexure   PTOSIS REPAIR Bilateral 02/2022   TONSILLECTOMY      Family History  Problem Relation Age of Onset   Diabetes Mother    Lung cancer Father    Heart disease Father    Stroke Father    Cancer Father    Diabetes Sister    Diabetes Other        Siblings      Controlled substance contract: n/a     Review of Systems  Constitutional:  Negative for diaphoresis.  Eyes:  Negative for pain.  Respiratory:  Negative for shortness of breath.   Cardiovascular:  Negative for chest pain, palpitations and leg swelling.  Gastrointestinal:  Negative for abdominal pain.  Endocrine: Negative for polydipsia.  Skin:  Negative for rash.  Neurological:  Negative for dizziness, weakness and headaches.  Hematological:  Does not bruise/bleed easily.  All other systems reviewed and are negative.      Objective:   Physical Exam Vitals and nursing note reviewed.  Constitutional:      Appearance: Normal appearance. He is well-developed.  HENT:     Head: Normocephalic.     Nose: Nose normal.     Mouth/Throat:     Mouth: Mucous membranes are moist.     Pharynx: Oropharynx is clear.  Eyes:     Pupils: Pupils are equal, round, and reactive to light.  Neck:      Thyroid: No thyroid mass or thyromegaly.     Vascular: No carotid bruit or JVD.     Trachea: Phonation normal.  Cardiovascular:     Rate and Rhythm: Normal rate and regular rhythm.  Pulmonary:     Effort: Pulmonary effort is normal. No respiratory distress.     Breath sounds: Normal breath sounds.  Abdominal:     General: Bowel sounds are normal.     Palpations: Abdomen is soft.     Tenderness: There is no abdominal tenderness.  Musculoskeletal:        General: Normal range of motion.     Cervical back: Normal range of motion and neck supple.  Lymphadenopathy:     Cervical: No cervical adenopathy.  Skin:    General: Skin is warm and dry.  Neurological:     Mental Status: He is alert and oriented to person, place, and time.  Psychiatric:        Behavior: Behavior normal.        Thought Content: Thought content normal.        Judgment: Judgment normal.     BP 137/87   Pulse 79   Temp 97.9 F (36.6 C) (Temporal)   Resp 20   Ht 5\' 11"  (1.803 m)   Wt 263 lb (119.3 kg)   SpO2 94%   BMI 36.68 kg/m   Hgba1c 8.4%      Assessment & Plan:   Roger Guerra comes in today with chief complaint of Medical Management of Chronic Issues   Diagnosis and orders addressed:  1. Hyperlipidemia associated with type 2 diabetes mellitus (HCC) Low fat diet - ezetimibe (ZETIA) 10 MG tablet; Take 1 tablet (10 mg total) by mouth daily.  Dispense: 90 tablet; Refill: 1  2. Type 2 diabetes mellitus with hyperglycemia, without long-term current use  of insulin (HCC) Continue carb counting Increase lantus to 123XX123 daily Added trulicity back - Bayer DCA Hb A1c Waived - Dulaglutide (TRULICITY) 3 0000000 SOPN; Inject 3 mg as directed once a week.  Dispense: 3 mL; Refill: 5  3. Hypopituitarism ([place]) Labs pending  4. Male hypogonadism 5. Other depression Stress management  6. Vitamin D deficiency Continue vitamin d supplement  7. At risk for heart disease  8. Gastroesophageal reflux  disease, unspecified whether esophagitis present Avoid spicy foods Do not eat 2 hours prior to bedtime  - omeprazole (PRILOSEC OTC) 20 MG tablet; Take 1 tablet (20 mg total) by mouth daily as needed (acid reflux).  Dispense: 90 tablet; Refill: 1  9. OSA on CPAP Continue to wear CPAP  10. Class 2 severe obesity with serious comorbidity and body mass index (BMI) of 36.0 to 36.9 in adult, unspecified obesity type ([place]) Discussed diet and exercise for person with BMI >25 Will recheck weight in 3-6 months   11. Type 2 diabetes mellitus with other specified complication, without long-term current use of insulin (HCC) - insulin glargine (LANTUS SOLOSTAR) 100 UNIT/ML Solostar Pen; Inject 35 Units into the skin daily.  Dispense: 15 mL; Refill: PRN  12. DDD (degenerative disc disease), cervical - carisoprodol (SOMA) 350 MG tablet; Take 1 tablet (350 mg total) by mouth 3 (three) times daily.  Dispense: 90 tablet; Refill: 2   Labs pending Health Maintenance reviewed Diet and exercise encouraged  Follow up plan: 1 month   [place], FNP

## 2022-12-24 DIAGNOSIS — G4733 Obstructive sleep apnea (adult) (pediatric): Secondary | ICD-10-CM | POA: Diagnosis not present

## 2023-01-18 ENCOUNTER — Other Ambulatory Visit: Payer: Self-pay | Admitting: Nurse Practitioner

## 2023-01-18 DIAGNOSIS — M5030 Other cervical disc degeneration, unspecified cervical region: Secondary | ICD-10-CM

## 2023-01-24 ENCOUNTER — Encounter: Payer: Self-pay | Admitting: Pharmacist

## 2023-01-24 DIAGNOSIS — G720 Drug-induced myopathy: Secondary | ICD-10-CM

## 2023-01-24 NOTE — Progress Notes (Signed)
.  [facility]) [facility] Quality [facility] Team Statin Quality Measure Assessment  01/24/2023  Roger Guerra 14-Aug-1956 [id number]  Per review of chart and payor information, patient has a diagnosis of diabetes but is not currently filling a statin prescription.  This places patient into the Statin Use In Patients with Diabetes (SUPD) measure for CMS.    Patient has documented trials of statins with reported severe muscle pain, but no corresponding CPT codes that would exclude patient from SUPD measure.  He has an upcoming visit on 01/25/23.  If deemed therapeutically appropriate, a statin exclusion code could be associated with the visit which would remove the patient from the measure.  (He is on Repatha and Ezetimibe)  The ASCVD Risk score (Arnett DK, et al., 2019) failed to calculate for the following reasons:   The valid total cholesterol range is 130 to 320 mg/dL 10/28/3084     Component Value Date/Time   CHOL 87 (L) 11/26/2022 1427   TRIG 183 (H) 11/26/2022 1427   HDL 33 (L) 11/26/2022 1427   CHOLHDL 2.6 11/26/2022 1427   LDLCALC 24 11/26/2022 1427    Please consider ONE of the following recommendations:  Code for past statin intolerance or  other exclusions (required annually)  Provider Requirements: Associate code during an office visit or telehealth encounter  Drug Induced Myopathy G72.0   Myopathy, unspecified G72.9   Myositis, unspecified M60.9   Rhabdomyolysis M62.82   Cirrhosis of liver K74.69   Prediabetes R73.03   PCOS E28.2    Plan: Route note to Provider prior to the upcoming appointment.  Beecher Mcardle, PharmD, BCACP [facility] Clinical Pharmacist [phone]

## 2023-01-25 ENCOUNTER — Ambulatory Visit (INDEPENDENT_AMBULATORY_CARE_PROVIDER_SITE_OTHER): Payer: Medicare HMO | Admitting: Nurse Practitioner

## 2023-01-25 ENCOUNTER — Encounter: Payer: Self-pay | Admitting: Nurse Practitioner

## 2023-01-25 VITALS — BP 126/76 | HR 80 | Temp 98.0°F | Resp 20 | Ht 71.0 in | Wt 262.0 lb

## 2023-01-25 DIAGNOSIS — E1165 Type 2 diabetes mellitus with hyperglycemia: Secondary | ICD-10-CM | POA: Diagnosis not present

## 2023-01-25 LAB — BAYER DCA HB A1C WAIVED: HB A1C (BAYER DCA - WAIVED): 8 % — ABNORMAL HIGH (ref 4.8–5.6)

## 2023-01-25 NOTE — Progress Notes (Signed)
   Subjective:    Patient ID: Roger Guerra, male    DOB: 03/05/1956, 67 y.o.   MRN: [id number]   Chief Complaint: diabetes only  HPI   Patient needs to have total knee replacement. His hgba1c was >12 when he was originally trying to schedule his surgery, they will not do surgery until his hgba1c is below 8. He has been working really hard on trying to get it down. He is here today to see if good enough  to schedule surgery.  Lab Results  Component Value Date   HGBA1C 8.4 (H) 12/21/2022      Patient Active Problem List   Diagnosis Date Noted   Type 2 diabetes mellitus with hyperglycemia, without long-term current use of insulin (HCC) 05/10/2022   Cervical radiculopathy 03/01/2022   DDD (degenerative disc disease), cervical 03/01/2022   Gastroesophageal reflux disease 10/27/2021   Class 2 severe obesity with serious comorbidity and body mass index (BMI) of 36.0 to 36.9 in adult [facility]) 08/12/2021   Hypopituitarism (HCC) 05/05/2021   Hyperlipidemia associated with type 2 diabetes mellitus (HCC) 03/03/2021   Vitamin D deficiency 03/03/2021   Depression 03/03/2021   OSA on CPAP 02/17/2021   At risk for heart disease 02/17/2021   Bilateral carpal tunnel syndrome 11/04/2020   Male hypogonadism 07/24/2015       Review of Systems  Constitutional:  Negative for diaphoresis.  Eyes:  Negative for pain.  Respiratory:  Negative for shortness of breath.   Cardiovascular:  Negative for chest pain, palpitations and leg swelling.  Gastrointestinal:  Negative for abdominal pain.  Endocrine: Negative for polydipsia.  Skin:  Negative for rash.  Neurological:  Negative for dizziness, weakness and headaches.  Hematological:  Does not bruise/bleed easily.  All other systems reviewed and are negative.      Objective:   Physical Exam Constitutional:      Appearance: Normal appearance.  Cardiovascular:     Rate and Rhythm: Normal rate and regular rhythm.     Heart sounds: Normal heart  sounds.  Pulmonary:     Breath sounds: Normal breath sounds.  Neurological:     General: No focal deficit present.     Mental Status: He is alert and oriented to person, place, and time.  Psychiatric:        Mood and Affect: Mood normal.        Behavior: Behavior normal.    BP 126/76   Pulse 80   Temp 98 F (36.7 C) (Temporal)   Resp 20   Ht 5\' 11"  (1.803 m)   Wt 262 lb (118.8 kg)   SpO2 94%   BMI 36.54 kg/m   HGBA1c 8.0%       Assessment & Plan:   Roger Guerra in today with chief complaint of Diabetes   1. Type 2 diabetes mellitus with hyperglycemia, without long-term current use of insulin (HCC) Continue  to watch carbs in diet Will send medically clearance letter for [facility] - Bayer DCA Hb A1c Waived    The above assessment and management plan was discussed with the patient. The patient verbalized understanding of and has agreed to the management plan. Patient is aware to call the clinic if symptoms persist or worsen. Patient is aware when to return to the clinic for a follow-up visit. Patient educated on when it is appropriate to go to the emergency department.   Roger Daphine Deutscher, FNP

## 2023-01-27 ENCOUNTER — Ambulatory Visit: Payer: Self-pay | Admitting: Orthopedic Surgery

## 2023-02-14 DIAGNOSIS — M25562 Pain in left knee: Secondary | ICD-10-CM | POA: Diagnosis not present

## 2023-02-17 ENCOUNTER — Ambulatory Visit: Payer: Self-pay | Admitting: Orthopedic Surgery

## 2023-02-17 DIAGNOSIS — E119 Type 2 diabetes mellitus without complications: Secondary | ICD-10-CM

## 2023-02-22 ENCOUNTER — Other Ambulatory Visit: Payer: Self-pay | Admitting: Nurse Practitioner

## 2023-02-22 DIAGNOSIS — M5030 Other cervical disc degeneration, unspecified cervical region: Secondary | ICD-10-CM

## 2023-02-22 NOTE — Patient Instructions (Signed)
SURGICAL WAITING ROOM VISITATION  Patients having surgery or a procedure may have no more than 2 support people in the waiting area - these visitors may rotate.    Children under the age of 66 must have an adult with them who is not the patient.  Due to an increase in RSV and influenza rates and associated hospitalizations, children ages 54 and under may not visit patients in [facility] hospitals.  If the patient needs to stay at the hospital during part of their recovery, the visitor guidelines for inpatient rooms apply. Pre-op nurse will coordinate an appropriate time for 1 support person to accompany patient in pre-op.  This support person may not rotate.    Please refer to the [facility] website for the visitor guidelines for Inpatients (after your surgery is over and you are in a regular room).    Your procedure is scheduled on: 03/02/23   Report to [facility] Main Entrance    Report to admitting at 6:00 AM   Call this number if you have problems the morning of surgery [phone]   Do not eat food :After Midnight.   After Midnight you may have the following liquids until 5:30 AM DAY OF SURGERY  Water Non-Citrus Juices (without pulp, NO RED-Apple, White grape, White cranberry) Black Coffee (NO MILK/CREAM OR CREAMERS, sugar ok)  Clear Tea (NO MILK/CREAM OR CREAMERS, sugar ok) regular and decaf                             Plain Jell-O (NO RED)                                           Fruit ices (not with fruit pulp, NO RED)                                     Popsicles (NO RED)                                                               Sports drinks like Gatorade (NO RED)               The day of surgery:  Drink ONE (1) Pre-Surgery G2 at 5:30 AM the morning of surgery. Drink in one sitting. Do not sip.  This drink was given to you during your hospital  pre-op appointment visit. Nothing else to drink after completing the  Pre-Surgery G2.          If you have  questions, please contact your surgeon's office.   FOLLOW BOWEL PREP AND ANY ADDITIONAL PRE OP INSTRUCTIONS YOU RECEIVED FROM YOUR SURGEON'S OFFICE!!!     Oral Hygiene is also important to reduce your risk of infection.                                    Remember - BRUSH YOUR TEETH THE MORNING OF SURGERY WITH YOUR REGULAR TOOTHPASTE  DENTURES WILL BE REMOVED PRIOR TO  SURGERY PLEASE DO NOT APPLY "Poly grip" OR ADHESIVES!!!   Take these medicines the morning of surgery with A SIP OF WATER: Zetia, Allegra, Finasteride, Omeprazole, Zofran, Percocet  DO NOT TAKE ANY ORAL DIABETIC MEDICATIONS DAY OF YOUR SURGERY  How to Manage Your Diabetes Before and After Surgery  Why is it important to control my blood sugar before and after surgery? Improving blood sugar levels before and after surgery helps healing and can limit problems. A way of improving blood sugar control is eating a healthy diet by:  Eating less sugar and carbohydrates  Increasing activity/exercise  Talking with your doctor about reaching your blood sugar goals High blood sugars (greater than 180 mg/dL) can raise your risk of infections and slow your recovery, so you will need to focus on controlling your diabetes during the weeks before surgery. Make sure that the doctor who takes care of your diabetes knows about your planned surgery including the date and location.  How do I manage my blood sugar before surgery? Check your blood sugar at least 4 times a day, starting 2 days before surgery, to make sure that the level is not too high or low. Check your blood sugar the morning of your surgery when you wake up and every 2 hours until you get to the Short Stay unit. If your blood sugar is less than 70 mg/dL, you will need to treat for low blood sugar: Do not take insulin. Treat a low blood sugar (less than 70 mg/dL) with  cup of clear juice (cranberry or apple), 4 glucose tablets, OR glucose gel. Recheck blood sugar in 15  minutes after treatment (to make sure it is greater than 70 mg/dL). If your blood sugar is not greater than 70 mg/dL on recheck, call [phone] for further instructions. Report your blood sugar to the short stay nurse when you get to Short Stay.  If you are admitted to the hospital after surgery: Your blood sugar will be checked by the staff and you will probably be given insulin after surgery (instead of oral diabetes medicines) to make sure you have good blood sugar levels. The goal for blood sugar control after surgery is 80-180 mg/dL.   WHAT DO I DO ABOUT MY DIABETES MEDICATION?  Do not take oral diabetes medicines (pills) the morning of surgery. Hold Trulicity for 7 days before surgery. Do not take 02/26/23.  THE DAY BEFORE SURGERY, Take Morning dose of Glipizide, no evening dose. Take 50% of Lantus.      THE MORNING OF SURGERY, do not take Glipizide.   DO NOT TAKE THE FOLLOWING 7 DAYS PRIOR TO SURGERY: Ozempic, Wegovy, Rybelsus (Semaglutide), Byetta (exenatide), Bydureon (exenatide ER), Victoza, Saxenda (liraglutide), or Trulicity (dulaglutide) Mounjaro (Tirzepatide) Adlyxin (Lixisenatide), Polyethylene Glycol Loxenatide.  Reviewed and Endorsed by [facility] Patient Education Committee, August 2015  Bring CPAP mask and tubing day of surgery.                              You may not have any metal on your body including jewelry, and body piercing             Do not wear lotions, powders, cologne, or deodorant              Men may shave face and neck.   Do not bring valuables to the hospital. [facility] IS NOT  RESPONSIBLE   FOR VALUABLES.   Contacts, glasses, dentures or bridgework may not be worn into surgery.   Bring small overnight bag day of surgery.   DO NOT BRING YOUR HOME MEDICATIONS TO THE HOSPITAL. PHARMACY WILL DISPENSE MEDICATIONS LISTED ON YOUR MEDICATION LIST TO YOU DURING YOUR ADMISSION IN THE HOSPITAL!              Please read over the  following fact sheets you were given: IF YOU HAVE QUESTIONS ABOUT YOUR PRE-OP INSTRUCTIONS PLEASE CALL [phone]ras      Pre-operative 5 CHG Bath Instructions   You can play a key role in reducing the risk of infection after surgery. Your skin needs to be as free of germs as possible. You can reduce the number of germs on your skin by washing with CHG (chlorhexidine gluconate) soap before surgery. CHG is an antiseptic soap that kills germs and continues to kill germs even after washing.   DO NOT use if you have an allergy to chlorhexidine/CHG or antibacterial soaps. If your skin becomes reddened or irritated, stop using the CHG and notify one of our RNs at [phone].   Please shower with the CHG soap starting 4 days before surgery using the following schedule:     Please keep in mind the following:  DO NOT shave, including legs and underarms, starting the day of your first shower.   You may shave your face at any point before/day of surgery.  Place clean sheets on your bed the day you start using CHG soap. Use a clean washcloth (not used since being washed) for each shower. DO NOT sleep with pets once you start using the CHG.   CHG Shower Instructions:  If you choose to wash your hair and private area, wash first with your normal shampoo/soap.  After you use shampoo/soap, rinse your hair and body thoroughly to remove shampoo/soap residue.  Turn the water OFF and apply about 3 tablespoons (45 ml) of CHG soap to a CLEAN washcloth.  Apply CHG soap ONLY FROM YOUR NECK DOWN TO YOUR TOES (washing for 3-5 minutes)  DO NOT use CHG soap on face, private areas, open wounds, or sores.  Pay special attention to the area where your surgery is being performed.  If you are having back surgery, having someone wash your back for you may be helpful. Wait 2 minutes after CHG soap is applied, then you may rinse off the CHG soap.  Pat dry with a clean towel  Put on clean clothes/pajamas   If  you choose to wear lotion, please use ONLY the CHG-compatible lotions on the back of this paper.     Additional instructions for the day of surgery: DO NOT APPLY any lotions, deodorants, cologne, or perfumes.   Put on clean/comfortable clothes.  Brush your teeth.  Ask your nurse before applying any prescription medications to the skin.      CHG Compatible Lotions   Aveeno Moisturizing lotion  Cetaphil Moisturizing Cream  Cetaphil Moisturizing Lotion  Clairol Herbal Essence Moisturizing Lotion, Dry Skin  Clairol Herbal Essence Moisturizing Lotion, Extra Dry Skin  Clairol Herbal Essence Moisturizing Lotion, Normal Skin  Curel Age Defying Therapeutic Moisturizing Lotion with Alpha Hydroxy  Curel Extreme Care Body Lotion  Curel Soothing Hands Moisturizing Hand Lotion  Curel Therapeutic Moisturizing Cream, Fragrance-Free  Curel Therapeutic Moisturizing Lotion, Fragrance-Free  Curel Therapeutic Moisturizing Lotion, Original Formula  Eucerin Daily Replenishing Lotion  Eucerin Dry Skin Therapy Plus Alpha Hydroxy Crme  Eucerin  Dry Skin Therapy Plus Alpha Hydroxy Lotion  Eucerin Original Crme  Eucerin Original Lotion  Eucerin Plus Crme Eucerin Plus Lotion  Eucerin TriLipid Replenishing Lotion  Keri Anti-Bacterial Hand Lotion  Keri Deep Conditioning Original Lotion Dry Skin Formula Softly Scented  Keri Deep Conditioning Original Lotion, Fragrance Free Sensitive Skin Formula  Keri Lotion Fast Absorbing Fragrance Free Sensitive Skin Formula  Keri Lotion Fast Absorbing Softly Scented Dry Skin Formula  Keri Original Lotion  Keri Skin Renewal Lotion Keri Silky Smooth Lotion  Keri Silky Smooth Sensitive Skin Lotion  Nivea Body Creamy Conditioning Oil  Nivea Body Extra Enriched Lotion  Nivea Body Original Lotion  Nivea Body Sheer Moisturizing Lotion Nivea Crme  Nivea Skin Firming Lotion  NutraDerm 30 Skin Lotion  NutraDerm Skin Lotion  NutraDerm Therapeutic Skin Cream  NutraDerm  Therapeutic Skin Lotion  ProShield Protective Hand Cream  Provon moisturizing lotion   ________________________________________________________________________  Incentive Spirometer  An incentive spirometer is a tool that can help keep your lungs clear and active. This tool measures how well you are filling your lungs with each breath. Taking long deep breaths may help reverse or decrease the chance of developing breathing (pulmonary) problems (especially infection) following: A long period of time when you are unable to move or be active. BEFORE THE PROCEDURE  If the spirometer includes an indicator to show your best effort, your nurse or respiratory therapist will set it to a desired goal. If possible, sit up straight or lean slightly forward. Try not to slouch. Hold the incentive spirometer in an upright position. INSTRUCTIONS FOR USE  Sit on the edge of your bed if possible, or sit up as far as you can in bed or on a chair. Hold the incentive spirometer in an upright position. Breathe out normally. Place the mouthpiece in your mouth and seal your lips tightly around it. Breathe in slowly and as deeply as possible, raising the piston or the ball toward the top of the column. Hold your breath for 3-5 seconds or for as long as possible. Allow the piston or ball to fall to the bottom of the column. Remove the mouthpiece from your mouth and breathe out normally. Rest for a few seconds and repeat Steps 1 through 7 at least 10 times every 1-2 hours when you are awake. Take your time and take a few normal breaths between deep breaths. The spirometer may include an indicator to show your best effort. Use the indicator as a goal to work toward during each repetition. After each set of 10 deep breaths, practice coughing to be sure your lungs are clear. If you have an incision (the cut made at the time of surgery), support your incision when coughing by placing a pillow or rolled up towels firmly  against it. Once you are able to get out of bed, walk around indoors and cough well. You may stop using the incentive spirometer when instructed by your caregiver.  RISKS AND COMPLICATIONS Take your time so you do not get dizzy or light-headed. If you are in pain, you may need to take or ask for pain medication before doing incentive spirometry. It is harder to take a deep breath if you are having pain. AFTER USE Rest and breathe slowly and easily. It can be helpful to keep track of a log of your progress. Your caregiver can provide you with a simple table to help with this. If you are using the spirometer at home, follow these instructions: [facility] MEDICAL CARE  IF:  You are having difficultly using the spirometer. You have trouble using the spirometer as often as instructed. Your pain medication is not giving enough relief while using the spirometer. You develop fever of 100.5 F (38.1 C) or higher. SEEK IMMEDIATE MEDICAL CARE IF:  You cough up bloody sputum that had not been present before. You develop fever of 102 F (38.9 C) or greater. You develop worsening pain at or near the incision site. MAKE SURE YOU:  Understand these instructions. Will watch your condition. Will get help right away if you are not doing well or get worse. Document Released: 01/24/2007 Document Revised: 12/06/2011 Document Reviewed: 03/27/2007 [facility] Patient Information 2014 [place], [place].   ________________________________________________________________________

## 2023-02-22 NOTE — Progress Notes (Signed)
COVID Vaccine Completed: yes  Date of COVID positive in last 90 days:  PCP - Mary-Margaret Daphine Deutscher, FNP Cardiologist - Zoila Shutter, MD LOV 04/20/22  Chest x-ray - 08/19/22 Epic EKG - 08/17/22 Epic Stress Test - 12/26/20 Epic ECHO - 12/26/20 Epic Cardiac Cath -  Pacemaker/ICD device last checked: Spinal Cord Stimulator:  Bowel Prep -   Sleep Study -  CPAP -   Fasting Blood Sugar -  Checks Blood Sugar _____ times a day  Last dose of GLP1 agonist-  Trulicity GLP1 instructions:     Last dose of SGLT-2 inhibitors-  N/A SGLT-2 instructions: N/A   Blood Thinner Instructions:  Time Aspirin Instructions: ASA 81 Last Dose:  Activity level:  Can go up a flight of stairs and perform activities of daily living without stopping and without symptoms of chest pain or shortness of breath.  Able to exercise without symptoms  Unable to go up a flight of stairs without symptoms of     Anesthesia review: OSA, DM2, DVT,   Patient denies shortness of breath, fever, cough and chest pain at PAT appointment  Patient verbalized understanding of instructions that were given to them at the PAT appointment. Patient was also instructed that they will need to review over the PAT instructions again at home before surgery.

## 2023-02-23 ENCOUNTER — Other Ambulatory Visit: Payer: Self-pay

## 2023-02-23 ENCOUNTER — Ambulatory Visit: Payer: Self-pay | Admitting: Orthopedic Surgery

## 2023-02-23 ENCOUNTER — Encounter (HOSPITAL_COMMUNITY)
Admission: RE | Admit: 2023-02-23 | Discharge: 2023-02-23 | Disposition: A | Discharge status: Home / Self Care | Payer: Medicare HMO | Source: Ambulatory Visit | Attending: Specialist | Admitting: Specialist

## 2023-02-23 ENCOUNTER — Encounter (HOSPITAL_COMMUNITY): Payer: Self-pay

## 2023-02-23 VITALS — BP 147/90 | HR 78 | Temp 98.0°F | Resp 14 | Ht 71.0 in | Wt 261.6 lb

## 2023-02-23 DIAGNOSIS — E119 Type 2 diabetes mellitus without complications: Secondary | ICD-10-CM

## 2023-02-23 DIAGNOSIS — Z01812 Encounter for preprocedural laboratory examination: Secondary | ICD-10-CM | POA: Insufficient documentation

## 2023-02-23 DIAGNOSIS — Z01818 Encounter for other preprocedural examination: Secondary | ICD-10-CM

## 2023-02-23 DIAGNOSIS — E1165 Type 2 diabetes mellitus with hyperglycemia: Secondary | ICD-10-CM | POA: Insufficient documentation

## 2023-02-23 HISTORY — DX: Radiculopathy, cervical region: M54.12

## 2023-02-23 LAB — CBC
HCT: 51.1 % (ref 39.0–52.0)
Hemoglobin: 17 g/dL (ref 13.0–17.0)
MCH: 30.1 pg (ref 26.0–34.0)
MCHC: 33.3 g/dL (ref 30.0–36.0)
MCV: 90.6 fL (ref 80.0–100.0)
Platelets: 272 10*3/uL (ref 150–400)
RBC: 5.64 MIL/uL (ref 4.22–5.81)
RDW: 14.5 % (ref 11.5–15.5)
WBC: 8.4 10*3/uL (ref 4.0–10.5)
nRBC: 0 % (ref 0.0–0.2)

## 2023-02-23 LAB — BASIC METABOLIC PANEL
Anion gap: 11 (ref 5–15)
BUN: 12 mg/dL (ref 8–23)
CO2: 23 mmol/L (ref 22–32)
Calcium: 9.3 mg/dL (ref 8.9–10.3)
Chloride: 105 mmol/L (ref 98–111)
Creatinine, Ser: 0.91 mg/dL (ref 0.61–1.24)
GFR, Estimated: >60 mL/min (ref >60)
Glucose, Bld: 121 mg/dL — ABNORMAL HIGH (ref 70–99)
Potassium: 4.2 mmol/L (ref 3.5–5.1)
Sodium: 139 mmol/L (ref 135–145)

## 2023-02-23 LAB — GLUCOSE, CAPILLARY: Glucose-Capillary: 130 mg/dL — ABNORMAL HIGH (ref 70–99)

## 2023-02-23 LAB — SURGICAL PCR SCREEN
MRSA, PCR: NEGATIVE
Staphylococcus aureus: NEGATIVE

## 2023-02-23 NOTE — H&P (Signed)
Roger Guerra is an 67 y.o. male.   Chief Complaint: left knee pain HPI: Patient is here for his H&P. He is scheduled for a left total knee replacement by Dr. Shelle Iron at [facility] on 03/02/23.  Dr. Shelle Iron and the patient mutually agreed to proceed with a total knee replacement. Risks and benefits of the procedure were discussed including stiffness, suboptimal range of motion, persistent pain, infection requiring removal of prosthesis and reinsertion, need for prophylactic antibiotics in the future, for example, dental procedures, possible need for manipulation, revision in the future and also anesthetic complications including DVT, PE, etc. We discussed the perioperative course, time in the hospital, postoperative recovery and the need for elevation to control swelling. We also discussed the predicted range of motion and the probability that squatting and kneeling would be unobtainable in the future. In addition, postoperative anticoagulation was discussed. We have obtained preoperative medical clearance as necessary. Provided illustrated handout and discussed it in detail. They will enroll in the total joint replacement educational forum at the hospital.  He was initially scheduled for surgery last year and then had to be rescheduled because his A1c was too high but he is now reduced his A1c to 8.  Past Medical History:  Diagnosis Date   Anxiety disorder    Arthritis    Atherosclerosis    Back pain    Cervical radiculopathy    Constipation    Coronary artery calcification seen on CT scan    Depression    Diabetes (HCC)    Erectile dysfunction    GERD (gastroesophageal reflux disease)    Heartburn    History of blood clots    History of DVT (deep vein thrombosis)    History of DVT of lower extremity    Right leg 2002    History of pneumonia    Hyperlipidemia    Hypogonadism in male    Hypopituitarism (HCC)    Joint pain    Nerve root and plexus disorder    Obesity     Obstructive sleep apnea    OSA on CPAP    Osteoarthritis    Sciatica    Sleep apnea    SOBOE (shortness of breath on exertion)    Spinal stenosis    Spondylosis    Swelling of both lower extremities    Type 2 diabetes mellitus (HCC)    Vitamin D deficiency     Past Surgical History:  Procedure Laterality Date   BLADDER SURGERY  2015   removal of skin tag near urethra   COLONOSCOPY N/A 12/10/2016   Procedure: COLONOSCOPY;  Surgeon: Corbin Ade, MD;  Location: [facility];  Service: Endoscopy;  Laterality: N/A;  8:30 AM   COLONOSCOPY WITH PROPOFOL N/A 04/29/2022   Procedure: COLONOSCOPY WITH PROPOFOL;  Surgeon: Corbin Ade, MD;  Location: [facility];  Service: Endoscopy;  Laterality: N/A;  8:00am   KNEE ARTHROSCOPY WITH MEDIAL MENISECTOMY Right 08/25/2017   Procedure: Right knee arthroscopy, partial medial menisectomy and debridement;  Surgeon: Jene Every, MD;  Location: [facility];  Service: Orthopedics;  Laterality: Right;  60 mins   POLYPECTOMY  12/10/2016   Procedure: POLYPECTOMY;  Surgeon: Corbin Ade, MD;  Location: [facility];  Service: Endoscopy;;  hepatic flexure   PTOSIS REPAIR Bilateral 02/2022   TONSILLECTOMY     WISDOM TOOTH EXTRACTION      Family History  Problem Relation Age of Onset   Diabetes Mother    Lung cancer  Father    Heart disease Father    Stroke Father    Cancer Father    Diabetes Sister    Diabetes Other        Siblings   Social History:  reports that he has quit smoking. His smoking use included cigarettes. He has a 37.50 pack-year smoking history. He has never used smokeless tobacco. He reports that he does not drink alcohol and does not use drugs.  Allergies:  Allergies  Allergen Reactions   Invokana [Canagliflozin] Other (See Comments)    Yeast infection   Statins Other (See Comments)    Severe muscle cramps    Current meds: Allegra Allergy Bayer Low Dose Aspirin 81 mg tablet,delayed release carisoprodoL 350 mg  tablet ezetimibe 10 mg tablet ibuprofen 800 mg tablet omeprazole 20 mg capsule,delayed release ondansetron HCL 4 mg tablet oxyCODONE-acetaminophen 5 mg-325 mg tablet Praluent Pen 75 mg/mL subcutaneous pen injector silodosin 8 mg capsule testosterone cypionate 200 mg/mL intramuscular oil ZyrTEC   Results for orders placed or performed during the hospital encounter of 02/23/23 (from the past 48 hour(s))  Surgical pcr screen     Status: None   Collection Time: 02/23/23 10:51 AM   Specimen: Nasal Mucosa; Nasal Swab  Result Value Ref Range   MRSA, PCR NEGATIVE NEGATIVE   Staphylococcus aureus NEGATIVE NEGATIVE    Comment: (NOTE) The Xpert SA Assay (FDA approved for NASAL specimens in patients 64 years of age and older), is one component of a comprehensive surveillance program. It is not intended to diagnose infection nor to guide or monitor treatment. Performed at [facility], [address] [place], [place] [address]   Basic metabolic panel per protocol     Status: Abnormal   Collection Time: 02/23/23 10:51 AM  Result Value Ref Range   Sodium 139 135 - 145 mmol/L   Potassium 4.2 3.5 - 5.1 mmol/L   Chloride 105 98 - 111 mmol/L   CO2 23 22 - 32 mmol/L   Glucose, Bld 121 (H) 70 - 99 mg/dL    Comment: Glucose reference range applies only to samples taken after fasting for at least 8 hours.   BUN 12 8 - 23 mg/dL   Creatinine, Ser 6.04 0.61 - 1.24 mg/dL   Calcium 9.3 8.9 - 54.0 mg/dL   GFR, Estimated >98 >11 mL/min    Comment: (NOTE) Calculated using the CKD-EPI Creatinine Equation (2021)    Anion gap 11 5 - 15    Comment: Performed at [facility], [address] [place], [place] [address]  CBC per protocol     Status: None   Collection Time: 02/23/23 10:51 AM  Result Value Ref Range   WBC 8.4 4.0 - 10.5 K/uL   RBC 5.64 4.22 - 5.81 MIL/uL   Hemoglobin 17.0 13.0 - 17.0 g/dL   HCT 29.5 62.1 - 30.8 %   MCV 90.6 80.0 - 100.0 fL   MCH  30.1 26.0 - 34.0 pg   MCHC 33.3 30.0 - 36.0 g/dL   RDW 65.7 84.6 - 96.2 %   Platelets 272 150 - 400 K/uL   nRBC 0.0 0.0 - 0.2 %    Comment: Performed at [facility], [address] [place], [place] [address]  Glucose, capillary     Status: Abnormal   Collection Time: 02/23/23 10:56 AM  Result Value Ref Range   Glucose-Capillary 130 (H) 70 - 99 mg/dL    Comment: Glucose reference range applies only  to samples taken after fasting for at least 8 hours.   No results found.  Review of Systems  Constitutional: Negative.   HENT: Negative.    Eyes: Negative.   Respiratory: Negative.    Cardiovascular: Negative.   Gastrointestinal: Negative.   Endocrine: Negative.   Genitourinary: Negative.   Musculoskeletal:  Positive for arthralgias, gait problem, joint swelling and myalgias.  Skin: Negative.   Hematological: Negative.     There were no vitals taken for this visit. Physical Exam Constitutional:      Appearance: Normal appearance.  HENT:     Head: Normocephalic and atraumatic.     Right Ear: External ear normal.     Left Ear: External ear normal.     Nose: Nose normal.     Mouth/Throat:     Pharynx: Oropharynx is clear.  Eyes:     Conjunctiva/sclera: Conjunctivae normal.  Cardiovascular:     Rate and Rhythm: Normal rate.     Pulses: Normal pulses.  Pulmonary:     Effort: Pulmonary effort is normal.     Breath sounds: Normal breath sounds.  Abdominal:     General: Bowel sounds are normal.  Musculoskeletal:     Cervical back: Normal range of motion.     Comments: Left knee exquisitely tender to medial joint line along the medial femoral condyle mild to moderate effusion his ranges 0-110. No calf pain or thigh pain. His range of the hip is unremarkable  Skin:    General: Skin is warm and dry.  Neurological:     Mental Status: He is alert.      Assessment/Plan Impression: Left knee end-stage osteoarthritis refractory to conservative  treatment  Plan: Pt with end-stage left knee DJD, bone-on-bone, refractory to conservative tx, scheduled for left total knee replacement by Dr. Shelle Iron on June 5. We again discussed the procedure itself as well as risks, complications and alternatives, including but not limited to DVT, PE, infx, bleeding, failure of procedure, need for secondary procedure including manipulation, nerve injury, ongoing pain/symptoms, anesthesia risk, even stroke or death. Also discussed typical post-op protocols, activity restrictions, need for PT, flexion/extension exercises, time out of work. Discussed need for DVT ppx post-op per protocol. Discussed dental ppx and infx prevention. Also discussed limitations post-operatively such as kneeling and squatting. All questions were answered. Patient desires to proceed with surgery as scheduled.  Will hold supplements, ASA and NSAIDs accordingly. Will remain NPO after midnight the night before surgery. Will present to [facility] for pre-op testing. Anticipate hospital stay to include at least 2 midnights given medical history and to ensure proper pain control. Plan Xarelto for DVT ppx post-op (hx of DVT). Plan oxycodone, Robaxin, Colace, Miralax. Plan return home with family postop and start outpatient PT in [place] postoperatively. Will follow up 10-14 days post-op for staple removal and xrays.  Of note he would like to get his right knee done in a few months. He also will be transitioning to a new PCP at the end of July, Dr. Dwana Melena.  Plan Left total knee replacement  Dorothy Spark, PA-C for Dr Shelle Iron 02/23/2023, 1:29 PM

## 2023-02-23 NOTE — H&P (View-Only) (Signed)
Roger Guerra is an 66 y.o. male.   Chief Complaint: left knee pain HPI: Patient is here for his H&P. He is scheduled for a left total knee replacement by Dr. Beane at [facility] on 03/02/23.  Dr. Beane and the patient mutually agreed to proceed with a total knee replacement. Risks and benefits of the procedure were discussed including stiffness, suboptimal range of motion, persistent pain, infection requiring removal of prosthesis and reinsertion, need for prophylactic antibiotics in the future, for example, dental procedures, possible need for manipulation, revision in the future and also anesthetic complications including DVT, PE, etc. We discussed the perioperative course, time in the hospital, postoperative recovery and the need for elevation to control swelling. We also discussed the predicted range of motion and the probability that squatting and kneeling would be unobtainable in the future. In addition, postoperative anticoagulation was discussed. We have obtained preoperative medical clearance as necessary. Provided illustrated handout and discussed it in detail. They will enroll in the total joint replacement educational forum at the hospital.  He was initially scheduled for surgery last year and then had to be rescheduled because his A1c was too high but he is now reduced his A1c to 8.  Past Medical History:  Diagnosis Date   Anxiety disorder    Arthritis    Atherosclerosis    Back pain    Cervical radiculopathy    Constipation    Coronary artery calcification seen on CT scan    Depression    Diabetes (HCC)    Erectile dysfunction    GERD (gastroesophageal reflux disease)    Heartburn    History of blood clots    History of DVT (deep vein thrombosis)    History of DVT of lower extremity    Right leg 2002    History of pneumonia    Hyperlipidemia    Hypogonadism in male    Hypopituitarism (HCC)    Joint pain    Nerve root and plexus disorder    Obesity     Obstructive sleep apnea    OSA on CPAP    Osteoarthritis    Sciatica    Sleep apnea    SOBOE (shortness of breath on exertion)    Spinal stenosis    Spondylosis    Swelling of both lower extremities    Type 2 diabetes mellitus (HCC)    Vitamin D deficiency     Past Surgical History:  Procedure Laterality Date   BLADDER SURGERY  2015   removal of skin tag near urethra   COLONOSCOPY N/A 12/10/2016   Procedure: COLONOSCOPY;  Surgeon: Eisen M Rourk, MD;  Location: [facility];  Service: Endoscopy;  Laterality: N/A;  8:30 AM   COLONOSCOPY WITH PROPOFOL N/A 04/29/2022   Procedure: COLONOSCOPY WITH PROPOFOL;  Surgeon: Rourk, Decarlos M, MD;  Location: [facility];  Service: Endoscopy;  Laterality: N/A;  8:00am   KNEE ARTHROSCOPY WITH MEDIAL MENISECTOMY Right 08/25/2017   Procedure: Right knee arthroscopy, partial medial menisectomy and debridement;  Surgeon: Beane, Jeffrey, MD;  Location: [facility];  Service: Orthopedics;  Laterality: Right;  60 mins   POLYPECTOMY  12/10/2016   Procedure: POLYPECTOMY;  Surgeon: Hughie M Rourk, MD;  Location: [facility];  Service: Endoscopy;;  hepatic flexure   PTOSIS REPAIR Bilateral 02/2022   TONSILLECTOMY     WISDOM TOOTH EXTRACTION      Family History  Problem Relation Age of Onset   Diabetes Mother    Lung cancer   Father    Heart disease Father    Stroke Father    Cancer Father    Diabetes Sister    Diabetes Other        Siblings   Social History:  reports that he has quit smoking. His smoking use included cigarettes. He has a 37.50 pack-year smoking history. He has never used smokeless tobacco. He reports that he does not drink alcohol and does not use drugs.  Allergies:  Allergies  Allergen Reactions   Invokana [Canagliflozin] Other (See Comments)    Yeast infection   Statins Other (See Comments)    Severe muscle cramps    Current meds: Allegra Allergy Bayer Low Dose Aspirin 81 mg tablet,delayed release carisoprodoL 350 mg  tablet ezetimibe 10 mg tablet ibuprofen 800 mg tablet omeprazole 20 mg capsule,delayed release ondansetron HCL 4 mg tablet oxyCODONE-acetaminophen 5 mg-325 mg tablet Praluent Pen 75 mg/mL subcutaneous pen injector silodosin 8 mg capsule testosterone cypionate 200 mg/mL intramuscular oil ZyrTEC   Results for orders placed or performed during the hospital encounter of 02/23/23 (from the past 48 hour(s))  Surgical pcr screen     Status: None   Collection Time: 02/23/23 10:51 AM   Specimen: Nasal Mucosa; Nasal Swab  Result Value Ref Range   MRSA, PCR NEGATIVE NEGATIVE   Staphylococcus aureus NEGATIVE NEGATIVE    Comment: (NOTE) The Xpert SA Assay (FDA approved for NASAL specimens in patients 22 years of age and older), is one component of a comprehensive surveillance program. It is not intended to diagnose infection nor to guide or monitor treatment. Performed at [facility], [address] [place], [place] [address]   Basic metabolic panel per protocol     Status: Abnormal   Collection Time: 02/23/23 10:51 AM  Result Value Ref Range   Sodium 139 135 - 145 mmol/L   Potassium 4.2 3.5 - 5.1 mmol/L   Chloride 105 98 - 111 mmol/L   CO2 23 22 - 32 mmol/L   Glucose, Bld 121 (H) 70 - 99 mg/dL    Comment: Glucose reference range applies only to samples taken after fasting for at least 8 hours.   BUN 12 8 - 23 mg/dL   Creatinine, Ser 0.91 0.61 - 1.24 mg/dL   Calcium 9.3 8.9 - 10.3 mg/dL   GFR, Estimated >60 >60 mL/min    Comment: (NOTE) Calculated using the CKD-EPI Creatinine Equation (2021)    Anion gap 11 5 - 15    Comment: Performed at [facility], [address] [place], [place] [address]  CBC per protocol     Status: None   Collection Time: 02/23/23 10:51 AM  Result Value Ref Range   WBC 8.4 4.0 - 10.5 K/uL   RBC 5.64 4.22 - 5.81 MIL/uL   Hemoglobin 17.0 13.0 - 17.0 g/dL   HCT 51.1 39.0 - 52.0 %   MCV 90.6 80.0 - 100.0 fL   MCH  30.1 26.0 - 34.0 pg   MCHC 33.3 30.0 - 36.0 g/dL   RDW 14.5 11.5 - 15.5 %   Platelets 272 150 - 400 K/uL   nRBC 0.0 0.0 - 0.2 %    Comment: Performed at [facility], [address] [place], [place] [address]  Glucose, capillary     Status: Abnormal   Collection Time: 02/23/23 10:56 AM  Result Value Ref Range   Glucose-Capillary 130 (H) 70 - 99 mg/dL    Comment: Glucose reference range applies only   to samples taken after fasting for at least 8 hours.   No results found.  Review of Systems  Constitutional: Negative.   HENT: Negative.    Eyes: Negative.   Respiratory: Negative.    Cardiovascular: Negative.   Gastrointestinal: Negative.   Endocrine: Negative.   Genitourinary: Negative.   Musculoskeletal:  Positive for arthralgias, gait problem, joint swelling and myalgias.  Skin: Negative.   Hematological: Negative.     There were no vitals taken for this visit. Physical Exam Constitutional:      Appearance: Normal appearance.  HENT:     Head: Normocephalic and atraumatic.     Right Ear: External ear normal.     Left Ear: External ear normal.     Nose: Nose normal.     Mouth/Throat:     Pharynx: Oropharynx is clear.  Eyes:     Conjunctiva/sclera: Conjunctivae normal.  Cardiovascular:     Rate and Rhythm: Normal rate.     Pulses: Normal pulses.  Pulmonary:     Effort: Pulmonary effort is normal.     Breath sounds: Normal breath sounds.  Abdominal:     General: Bowel sounds are normal.  Musculoskeletal:     Cervical back: Normal range of motion.     Comments: Left knee exquisitely tender to medial joint line along the medial femoral condyle mild to moderate effusion his ranges 0-110. No calf pain or thigh pain. His range of the hip is unremarkable  Skin:    General: Skin is warm and dry.  Neurological:     Mental Status: He is alert.      Assessment/Plan Impression: Left knee end-stage osteoarthritis refractory to conservative  treatment  Plan: Pt with end-stage left knee DJD, bone-on-bone, refractory to conservative tx, scheduled for left total knee replacement by Dr. Beane on June 5. We again discussed the procedure itself as well as risks, complications and alternatives, including but not limited to DVT, PE, infx, bleeding, failure of procedure, need for secondary procedure including manipulation, nerve injury, ongoing pain/symptoms, anesthesia risk, even stroke or death. Also discussed typical post-op protocols, activity restrictions, need for PT, flexion/extension exercises, time out of work. Discussed need for DVT ppx post-op per protocol. Discussed dental ppx and infx prevention. Also discussed limitations post-operatively such as kneeling and squatting. All questions were answered. Patient desires to proceed with surgery as scheduled.  Will hold supplements, ASA and NSAIDs accordingly. Will remain NPO after midnight the night before surgery. Will present to [facility] for pre-op testing. Anticipate hospital stay to include at least 2 midnights given medical history and to ensure proper pain control. Plan Xarelto for DVT ppx post-op (hx of DVT). Plan oxycodone, Robaxin, Colace, Miralax. Plan return home with family postop and start outpatient PT in [place] postoperatively. Will follow up 10-14 days post-op for staple removal and xrays.  Of note he would like to get his right knee done in a few months. He also will be transitioning to a new PCP at the end of July, Dr. Zack Hall.  Plan Left total knee replacement  Roger Guerra M Hisashi Amadon, PA-C for Dr Beane 02/23/2023, 1:29 PM    

## 2023-02-24 LAB — HEMOGLOBIN A1C
Hgb A1c MFr Bld: 7.5 % — ABNORMAL HIGH (ref 4.8–5.6)
Mean Plasma Glucose: 169 mg/dL

## 2023-02-24 NOTE — Progress Notes (Signed)
A1C 7.5 results routed to Dr. Shelle Iron.

## 2023-02-25 DIAGNOSIS — M546 Pain in thoracic spine: Secondary | ICD-10-CM | POA: Diagnosis not present

## 2023-02-25 DIAGNOSIS — M9902 Segmental and somatic dysfunction of thoracic region: Secondary | ICD-10-CM | POA: Diagnosis not present

## 2023-02-25 DIAGNOSIS — M9901 Segmental and somatic dysfunction of cervical region: Secondary | ICD-10-CM | POA: Diagnosis not present

## 2023-02-25 DIAGNOSIS — M542 Cervicalgia: Secondary | ICD-10-CM | POA: Diagnosis not present

## 2023-02-28 DIAGNOSIS — M9901 Segmental and somatic dysfunction of cervical region: Secondary | ICD-10-CM | POA: Diagnosis not present

## 2023-02-28 DIAGNOSIS — M9902 Segmental and somatic dysfunction of thoracic region: Secondary | ICD-10-CM | POA: Diagnosis not present

## 2023-02-28 DIAGNOSIS — M542 Cervicalgia: Secondary | ICD-10-CM | POA: Diagnosis not present

## 2023-02-28 DIAGNOSIS — M546 Pain in thoracic spine: Secondary | ICD-10-CM | POA: Diagnosis not present

## 2023-03-01 ENCOUNTER — Encounter (HOSPITAL_COMMUNITY): Payer: Self-pay | Admitting: Specialist

## 2023-03-01 MED ORDER — DEXTROSE 50 % IV SOLN
INTRAVENOUS | Status: AC
  Filled 2023-03-01: qty 50

## 2023-03-01 NOTE — Anesthesia Preprocedure Evaluation (Addendum)
Anesthesia Evaluation  Patient identified by MRN, date of birth, ID band Patient awake    Reviewed: Allergy & Precautions, NPO status , Patient's Chart, lab work & pertinent test results  Airway Mallampati: II  TM Distance: >3 FB     Dental  (+) Missing, Caps, Partial Lower   Pulmonary sleep apnea and Continuous Positive Airway Pressure Ventilation , former smoker   Pulmonary exam normal breath sounds clear to auscultation       Cardiovascular + CAD and + DVT  Normal cardiovascular exam Rhythm:Regular Rate:Normal  EKG 08/17/22 NSR, old inferior MI  Echo 12/26/20 1. Left ventricular ejection fraction, by estimation, is 60 to 65%. The  left ventricle has normal function. The left ventricle has no regional  wall motion abnormalities. There is mild left ventricular hypertrophy.  Left ventricular diastolic parameters were normal.   2. Right ventricular systolic function is low normal. The right  ventricular size is mildly enlarged. There is normal pulmonary artery  systolic pressure.   3. The mitral valve is normal in structure. No evidence of mitral valve  regurgitation. No evidence of mitral stenosis.   4. The aortic valve is tricuspid. Aortic valve regurgitation is not  visualized. No aortic stenosis is present.   5. The inferior vena cava is normal in size with greater than 50%  respiratory variability, suggesting right atrial pressure of 3 mmHg.      Neuro/Psych  PSYCHIATRIC DISORDERS Anxiety Depression     Neuromuscular disease    GI/Hepatic Neg liver ROS,GERD  Medicated,,  Endo/Other  diabetes, Well Controlled, Type 2, Insulin Dependent, Oral Hypoglycemic Agents  Hyperlipidemia Obesity Male hypogonadism  Renal/GU negative Renal ROS   BPH    Musculoskeletal  (+) Arthritis , Osteoarthritis,  DDD cervical spine Cervical radiculopathy Cervical spondylosis OA left knee   Abdominal  (+) + obese  Peds   Hematology negative hematology ROS (+)   Anesthesia Other Findings   Reproductive/Obstetrics ED                              Anesthesia Physical Anesthesia Plan  ASA: 3  Anesthesia Plan: Spinal   Post-op Pain Management: Regional block* and Minimal or no pain anticipated   Induction: Intravenous  PONV Risk Score and Plan: 2 and Treatment may vary due to age or medical condition, Propofol infusion and Ondansetron  Airway Management Planned: Natural Airway  Additional Equipment: None  Intra-op Plan:   Post-operative Plan:   Informed Consent: I have reviewed the patients History and Physical, chart, labs and discussed the procedure including the risks, benefits and alternatives for the proposed anesthesia with the patient or authorized representative who has indicated his/her understanding and acceptance.     Dental advisory given  Plan Discussed with: CRNA and Anesthesiologist  Anesthesia Plan Comments:          Anesthesia Quick Evaluation

## 2023-03-02 ENCOUNTER — Ambulatory Visit (HOSPITAL_COMMUNITY): Payer: Medicare HMO | Admitting: Physician Assistant

## 2023-03-02 ENCOUNTER — Encounter (HOSPITAL_COMMUNITY): Payer: Self-pay | Admitting: Specialist

## 2023-03-02 ENCOUNTER — Ambulatory Visit (HOSPITAL_COMMUNITY): Payer: Medicare HMO | Admitting: Certified Registered"

## 2023-03-02 ENCOUNTER — Other Ambulatory Visit: Payer: Self-pay

## 2023-03-02 ENCOUNTER — Encounter (HOSPITAL_COMMUNITY)
Admission: AD | Disposition: A | Discharge status: Home / Self Care | Payer: Self-pay | Source: Home / Self Care | Attending: Specialist

## 2023-03-02 ENCOUNTER — Inpatient Hospital Stay (HOSPITAL_COMMUNITY)
Admission: AD | Admit: 2023-03-02 | Discharge: 2023-03-06 | DRG: 470 | Disposition: A | Discharge status: Home / Self Care | Payer: Medicare HMO | Attending: Specialist | Admitting: Specialist

## 2023-03-02 ENCOUNTER — Inpatient Hospital Stay (HOSPITAL_COMMUNITY): Payer: Medicare HMO

## 2023-03-02 DIAGNOSIS — E669 Obesity, unspecified: Secondary | ICD-10-CM | POA: Diagnosis not present

## 2023-03-02 DIAGNOSIS — Z888 Allergy status to other drugs, medicaments and biological substances status: Secondary | ICD-10-CM

## 2023-03-02 DIAGNOSIS — I2510 Atherosclerotic heart disease of native coronary artery without angina pectoris: Secondary | ICD-10-CM | POA: Diagnosis not present

## 2023-03-02 DIAGNOSIS — Z471 Aftercare following joint replacement surgery: Secondary | ICD-10-CM | POA: Diagnosis not present

## 2023-03-02 DIAGNOSIS — Z87891 Personal history of nicotine dependence: Secondary | ICD-10-CM

## 2023-03-02 DIAGNOSIS — Z8249 Family history of ischemic heart disease and other diseases of the circulatory system: Secondary | ICD-10-CM | POA: Diagnosis not present

## 2023-03-02 DIAGNOSIS — N400 Enlarged prostate without lower urinary tract symptoms: Secondary | ICD-10-CM | POA: Diagnosis present

## 2023-03-02 DIAGNOSIS — Z833 Family history of diabetes mellitus: Secondary | ICD-10-CM

## 2023-03-02 DIAGNOSIS — M1712 Unilateral primary osteoarthritis, left knee: Secondary | ICD-10-CM | POA: Diagnosis not present

## 2023-03-02 DIAGNOSIS — Z86718 Personal history of other venous thrombosis and embolism: Secondary | ICD-10-CM | POA: Diagnosis not present

## 2023-03-02 DIAGNOSIS — Z823 Family history of stroke: Secondary | ICD-10-CM | POA: Diagnosis not present

## 2023-03-02 DIAGNOSIS — Z6836 Body mass index (BMI) 36.0-36.9, adult: Secondary | ICD-10-CM | POA: Diagnosis not present

## 2023-03-02 DIAGNOSIS — Z9989 Dependence on other enabling machines and devices: Secondary | ICD-10-CM | POA: Diagnosis not present

## 2023-03-02 DIAGNOSIS — E785 Hyperlipidemia, unspecified: Secondary | ICD-10-CM | POA: Diagnosis not present

## 2023-03-02 DIAGNOSIS — G8918 Other acute postprocedural pain: Secondary | ICD-10-CM | POA: Diagnosis not present

## 2023-03-02 DIAGNOSIS — Z801 Family history of malignant neoplasm of trachea, bronchus and lung: Secondary | ICD-10-CM

## 2023-03-02 DIAGNOSIS — G4733 Obstructive sleep apnea (adult) (pediatric): Secondary | ICD-10-CM

## 2023-03-02 DIAGNOSIS — E119 Type 2 diabetes mellitus without complications: Secondary | ICD-10-CM | POA: Diagnosis not present

## 2023-03-02 DIAGNOSIS — Z96652 Presence of left artificial knee joint: Secondary | ICD-10-CM | POA: Diagnosis not present

## 2023-03-02 DIAGNOSIS — F419 Anxiety disorder, unspecified: Secondary | ICD-10-CM | POA: Diagnosis present

## 2023-03-02 HISTORY — PX: TOTAL KNEE ARTHROPLASTY: SHX125

## 2023-03-02 LAB — GLUCOSE, CAPILLARY
Glucose-Capillary: 171 mg/dL — ABNORMAL HIGH (ref 70–99)
Glucose-Capillary: 122 mg/dL — ABNORMAL HIGH (ref 70–99)
Glucose-Capillary: 151 mg/dL — ABNORMAL HIGH (ref 70–99)
Glucose-Capillary: 144 mg/dL — ABNORMAL HIGH (ref 70–99)

## 2023-03-02 SURGERY — ARTHROPLASTY, KNEE, TOTAL
Anesthesia: Spinal | Site: Knee | Laterality: Left

## 2023-03-02 MED ORDER — DULAGLUTIDE 3 MG/0.5ML ~~LOC~~ SOAJ: 3.0000 mg | SUBCUTANEOUS | Status: DC

## 2023-03-02 MED ORDER — TAMSULOSIN HCL 0.4 MG PO CAPS
0.4000 mg | ORAL_CAPSULE | Freq: Two times a day (BID) | ORAL | Status: DC
  Administered 2023-03-02 – 2023-03-06 (×8): 0.4 mg via ORAL
  Filled 2023-03-02 (×8): qty 1

## 2023-03-02 MED ORDER — ALUM & MAG HYDROXIDE-SIMETH 200-200-20 MG/5ML PO SUSP: 30.0000 mL | ORAL | Status: DC | PRN

## 2023-03-02 MED ORDER — SODIUM CHLORIDE 0.9 % IR SOLN
Status: DC | PRN
  Administered 2023-03-02: 3000 mL

## 2023-03-02 MED ORDER — BUPIVACAINE LIPOSOME 1.3 % IJ SUSP
INTRAMUSCULAR | Status: DC | PRN
  Administered 2023-03-02: 20 mL

## 2023-03-02 MED ORDER — AMISULPRIDE (ANTIEMETIC) 5 MG/2ML IV SOLN
INTRAVENOUS | Status: AC
  Administered 2023-03-02: 10 mg via INTRAVENOUS
  Filled 2023-03-02: qty 4

## 2023-03-02 MED ORDER — METOCLOPRAMIDE HCL 5 MG/ML IJ SOLN: 5.0000 mg | Freq: Three times a day (TID) | INTRAMUSCULAR | Status: DC | PRN

## 2023-03-02 MED ORDER — LORAZEPAM 1 MG PO TABS
1.0000 mg | ORAL_TABLET | Freq: Four times a day (QID) | ORAL | Status: DC | PRN
  Administered 2023-03-02: 1 mg via ORAL
  Filled 2023-03-02 (×2): qty 1

## 2023-03-02 MED ORDER — 5-HTP 100 MG PO CAPS: 150.0000 mg | ORAL_CAPSULE | Freq: Every day | ORAL | Status: DC

## 2023-03-02 MED ORDER — PROPOFOL 500 MG/50ML IV EMUL
INTRAVENOUS | Status: DC | PRN
  Administered 2023-03-02: 85 ug/kg/min via INTRAVENOUS

## 2023-03-02 MED ORDER — HYDROMORPHONE HCL 1 MG/ML IJ SOLN
0.5000 mg | INTRAMUSCULAR | Status: DC | PRN
  Administered 2023-03-02 (×2): 1 mg via INTRAVENOUS
  Filled 2023-03-02 (×2): qty 1

## 2023-03-02 MED ORDER — METHOCARBAMOL 500 MG PO TABS
500.0000 mg | ORAL_TABLET | Freq: Four times a day (QID) | ORAL | Status: DC | PRN
  Administered 2023-03-02 – 2023-03-06 (×10): 500 mg via ORAL
  Filled 2023-03-02 (×10): qty 1

## 2023-03-02 MED ORDER — DIPHENHYDRAMINE HCL 12.5 MG/5ML PO ELIX: 12.5000 mg | ORAL_SOLUTION | ORAL | Status: DC | PRN

## 2023-03-02 MED ORDER — APIXABAN 2.5 MG PO TABS
2.5000 mg | ORAL_TABLET | Freq: Two times a day (BID) | ORAL | Status: DC
  Administered 2023-03-03 – 2023-03-06 (×7): 2.5 mg via ORAL
  Filled 2023-03-02 (×7): qty 1

## 2023-03-02 MED ORDER — FENTANYL CITRATE (PF) 100 MCG/2ML IJ SOLN
INTRAMUSCULAR | Status: AC
  Filled 2023-03-02: qty 2

## 2023-03-02 MED ORDER — MAGNESIUM CITRATE PO SOLN
1.0000 | Freq: Once | ORAL | Status: AC | PRN
  Administered 2023-03-05: 1 via ORAL
  Filled 2023-03-02: qty 296

## 2023-03-02 MED ORDER — ROPIVACAINE HCL 5 MG/ML IJ SOLN
INTRAMUSCULAR | Status: DC | PRN
  Administered 2023-03-02: 30 mL via PERINEURAL

## 2023-03-02 MED ORDER — ADULT MULTIVITAMIN W/MINERALS CH
1.0000 | ORAL_TABLET | Freq: Every day | ORAL | Status: DC
  Administered 2023-03-03 – 2023-03-06 (×4): 1 via ORAL
  Filled 2023-03-02 (×4): qty 1

## 2023-03-02 MED ORDER — SALINE SPRAY 0.65 % NA SOLN
1.0000 | Freq: Two times a day (BID) | NASAL | Status: DC
  Administered 2023-03-06: 1 via NASAL
  Filled 2023-03-02: qty 44

## 2023-03-02 MED ORDER — CEFAZOLIN SODIUM-DEXTROSE 2-4 GM/100ML-% IV SOLN
2.0000 g | Freq: Four times a day (QID) | INTRAVENOUS | Status: AC
  Administered 2023-03-02 (×2): 2 g via INTRAVENOUS
  Filled 2023-03-02 (×2): qty 100

## 2023-03-02 MED ORDER — EPINEPHRINE PF 1 MG/ML IJ SOLN
INTRAMUSCULAR | Status: AC
  Filled 2023-03-02: qty 1

## 2023-03-02 MED ORDER — OXYCODONE HCL 5 MG PO TABS
5.0000 mg | ORAL_TABLET | ORAL | Status: DC | PRN
  Administered 2023-03-02: 5 mg via ORAL
  Filled 2023-03-02: qty 1

## 2023-03-02 MED ORDER — OXYCODONE HCL 5 MG PO TABS: 5.0000 mg | ORAL_TABLET | Freq: Once | ORAL | Status: DC | PRN

## 2023-03-02 MED ORDER — FENTANYL CITRATE (PF) 100 MCG/2ML IJ SOLN
INTRAMUSCULAR | Status: DC | PRN
  Administered 2023-03-02: 50 ug via INTRAVENOUS

## 2023-03-02 MED ORDER — FINASTERIDE 5 MG PO TABS
5.0000 mg | ORAL_TABLET | Freq: Every day | ORAL | Status: DC
  Administered 2023-03-03 – 2023-03-06 (×4): 5 mg via ORAL
  Filled 2023-03-02 (×4): qty 1

## 2023-03-02 MED ORDER — ONDANSETRON HCL 4 MG/2ML IJ SOLN
4.0000 mg | Freq: Four times a day (QID) | INTRAMUSCULAR | Status: DC | PRN
  Administered 2023-03-02 – 2023-03-04 (×2): 4 mg via INTRAVENOUS
  Filled 2023-03-02 (×2): qty 2

## 2023-03-02 MED ORDER — GABAPENTIN 300 MG PO CAPS: 300.0000 mg | ORAL_CAPSULE | Freq: Three times a day (TID) | ORAL | Status: DC

## 2023-03-02 MED ORDER — CEFAZOLIN SODIUM-DEXTROSE 2-4 GM/100ML-% IV SOLN
INTRAVENOUS | Status: AC
  Filled 2023-03-02: qty 100

## 2023-03-02 MED ORDER — METHOCARBAMOL 500 MG IVPB - SIMPLE MED: 500.0000 mg | Freq: Four times a day (QID) | INTRAVENOUS | Status: DC | PRN

## 2023-03-02 MED ORDER — BISACODYL 5 MG PO TBEC: 5.0000 mg | DELAYED_RELEASE_TABLET | Freq: Every day | ORAL | Status: DC | PRN

## 2023-03-02 MED ORDER — OXYCODONE HCL 5 MG PO TABS
10.0000 mg | ORAL_TABLET | ORAL | Status: DC | PRN
  Administered 2023-03-02 – 2023-03-04 (×7): 15 mg via ORAL
  Filled 2023-03-02 (×7): qty 3

## 2023-03-02 MED ORDER — MENTHOL 3 MG MT LOZG: 1.0000 | LOZENGE | OROMUCOSAL | Status: DC | PRN

## 2023-03-02 MED ORDER — OXYCODONE HCL 10 MG PO TABS: 10.0000 mg | ORAL_TABLET | ORAL | 0 refills | Status: DC | PRN

## 2023-03-02 MED ORDER — BUPIVACAINE HCL (PF) 0.25 % IJ SOLN
INTRAMUSCULAR | Status: AC
  Filled 2023-03-02: qty 30

## 2023-03-02 MED ORDER — ONDANSETRON HCL 4 MG PO TABS: 4.0000 mg | ORAL_TABLET | Freq: Three times a day (TID) | ORAL | Status: DC | PRN

## 2023-03-02 MED ORDER — ONDANSETRON HCL 4 MG PO TABS
4.0000 mg | ORAL_TABLET | Freq: Four times a day (QID) | ORAL | Status: DC | PRN
  Administered 2023-03-04: 4 mg via ORAL
  Filled 2023-03-02 (×2): qty 1

## 2023-03-02 MED ORDER — METHOCARBAMOL 500 MG PO TABS: 500.0000 mg | ORAL_TABLET | Freq: Three times a day (TID) | ORAL | 1 refills | Status: DC | PRN

## 2023-03-02 MED ORDER — LORAZEPAM 1 MG PO TABS: 1.0000 mg | ORAL_TABLET | Freq: Four times a day (QID) | ORAL | Status: DC | PRN

## 2023-03-02 MED ORDER — MIDAZOLAM HCL 2 MG/2ML IJ SOLN
INTRAMUSCULAR | Status: DC | PRN
  Administered 2023-03-02: 1 mg via INTRAVENOUS

## 2023-03-02 MED ORDER — DEXMEDETOMIDINE HCL IN NACL 80 MCG/20ML IV SOLN
INTRAVENOUS | Status: AC
  Filled 2023-03-02: qty 20

## 2023-03-02 MED ORDER — CLOTRIMAZOLE 1 % EX CREA: TOPICAL_CREAM | Freq: Two times a day (BID) | CUTANEOUS | Status: DC

## 2023-03-02 MED ORDER — ONDANSETRON HCL 4 MG/2ML IJ SOLN: 4.0000 mg | Freq: Once | INTRAMUSCULAR | Status: DC | PRN

## 2023-03-02 MED ORDER — PROPOFOL 1000 MG/100ML IV EMUL
INTRAVENOUS | Status: AC
  Filled 2023-03-02: qty 100

## 2023-03-02 MED ORDER — BUPIVACAINE-EPINEPHRINE 0.25% -1:200000 IJ SOLN
INTRAMUSCULAR | Status: DC | PRN
  Administered 2023-03-02: 30 mL

## 2023-03-02 MED ORDER — 0.9 % SODIUM CHLORIDE (POUR BTL) OPTIME
TOPICAL | Status: DC | PRN
  Administered 2023-03-02: 1000 mL

## 2023-03-02 MED ORDER — VITAMIN D 25 MCG (1000 UNIT) PO TABS
5000.0000 [IU] | ORAL_TABLET | Freq: Every day | ORAL | Status: DC
  Administered 2023-03-03 – 2023-03-06 (×4): 5000 [IU] via ORAL
  Filled 2023-03-02 (×7): qty 5

## 2023-03-02 MED ORDER — TESTOSTERONE CYPIONATE 200 MG/ML IM SOLN: 100.0000 mg | INTRAMUSCULAR | Status: DC

## 2023-03-02 MED ORDER — PHENYLEPHRINE HCL (PRESSORS) 10 MG/ML IV SOLN
INTRAVENOUS | Status: AC
  Filled 2023-03-02: qty 1

## 2023-03-02 MED ORDER — DOCUSATE SODIUM 100 MG PO CAPS: 100.0000 mg | ORAL_CAPSULE | Freq: Two times a day (BID) | ORAL | 1 refills | Status: DC | PRN

## 2023-03-02 MED ORDER — HYDROMORPHONE HCL 1 MG/ML IJ SOLN
1.0000 mg | INTRAMUSCULAR | Status: DC | PRN
  Administered 2023-03-02 – 2023-03-03 (×2): 2 mg via INTRAVENOUS
  Filled 2023-03-02 (×2): qty 2

## 2023-03-02 MED ORDER — PHENYLEPHRINE 80 MCG/ML (10ML) SYRINGE FOR IV PUSH (FOR BLOOD PRESSURE SUPPORT)
PREFILLED_SYRINGE | INTRAVENOUS | Status: AC
  Filled 2023-03-02: qty 10

## 2023-03-02 MED ORDER — PROPOFOL 10 MG/ML IV BOLUS
INTRAVENOUS | Status: DC | PRN
  Administered 2023-03-02: 40 mg via INTRAVENOUS
  Administered 2023-03-02 (×2): 30 mg via INTRAVENOUS

## 2023-03-02 MED ORDER — AMISULPRIDE (ANTIEMETIC) 5 MG/2ML IV SOLN: 10.0000 mg | Freq: Once | INTRAVENOUS | Status: AC | PRN

## 2023-03-02 MED ORDER — GABAPENTIN 300 MG PO CAPS
300.0000 mg | ORAL_CAPSULE | Freq: Three times a day (TID) | ORAL | Status: DC
  Administered 2023-03-02 – 2023-03-06 (×11): 300 mg via ORAL
  Filled 2023-03-02 (×11): qty 1

## 2023-03-02 MED ORDER — OXYCODONE HCL 5 MG/5ML PO SOLN: 5.0000 mg | Freq: Once | ORAL | Status: DC | PRN

## 2023-03-02 MED ORDER — PROPOFOL 500 MG/50ML IV EMUL
INTRAVENOUS | Status: AC
  Filled 2023-03-02: qty 50

## 2023-03-02 MED ORDER — ZINC SULFATE 220 (50 ZN) MG PO CAPS
220.0000 mg | ORAL_CAPSULE | Freq: Every day | ORAL | Status: DC
  Administered 2023-03-03 – 2023-03-06 (×4): 220 mg via ORAL
  Filled 2023-03-02 (×4): qty 1

## 2023-03-02 MED ORDER — MIDAZOLAM HCL 2 MG/2ML IJ SOLN
INTRAMUSCULAR | Status: AC
  Filled 2023-03-02: qty 2

## 2023-03-02 MED ORDER — INSULIN ASPART 100 UNIT/ML IJ SOLN
0.0000 [IU] | Freq: Three times a day (TID) | INTRAMUSCULAR | Status: DC
  Administered 2023-03-02 – 2023-03-03 (×2): 3 [IU] via SUBCUTANEOUS
  Administered 2023-03-03: 5 [IU] via SUBCUTANEOUS
  Administered 2023-03-03 – 2023-03-06 (×8): 3 [IU] via SUBCUTANEOUS

## 2023-03-02 MED ORDER — POLYETHYLENE GLYCOL 3350 17 G PO PACK: 17.0000 g | PACK | Freq: Every day | ORAL | 0 refills | Status: DC

## 2023-03-02 MED ORDER — DOCUSATE SODIUM 100 MG PO CAPS
100.0000 mg | ORAL_CAPSULE | Freq: Two times a day (BID) | ORAL | Status: DC
  Administered 2023-03-02 – 2023-03-06 (×8): 100 mg via ORAL
  Filled 2023-03-02 (×8): qty 1

## 2023-03-02 MED ORDER — INSULIN GLARGINE-YFGN 100 UNIT/ML ~~LOC~~ SOLN: 100.0000 [IU] | Freq: Every day | SUBCUTANEOUS | Status: DC

## 2023-03-02 MED ORDER — TRANEXAMIC ACID-NACL 1000-0.7 MG/100ML-% IV SOLN
1000.0000 mg | INTRAVENOUS | Status: AC
  Administered 2023-03-02: 1000 mg via INTRAVENOUS

## 2023-03-02 MED ORDER — CEFAZOLIN SODIUM-DEXTROSE 2-4 GM/100ML-% IV SOLN
2.0000 g | INTRAVENOUS | Status: AC
  Administered 2023-03-02: 2 g via INTRAVENOUS

## 2023-03-02 MED ORDER — BUPIVACAINE IN DEXTROSE 0.75-8.25 % IT SOLN
INTRATHECAL | Status: DC | PRN
  Administered 2023-03-02: 2 mL via INTRATHECAL

## 2023-03-02 MED ORDER — OMEPRAZOLE 20 MG PO CPDR: 20.0000 mg | DELAYED_RELEASE_CAPSULE | Freq: Every day | ORAL | Status: DC | PRN

## 2023-03-02 MED ORDER — HYDROMORPHONE HCL 2 MG PO TABS
2.0000 mg | ORAL_TABLET | ORAL | Status: DC | PRN
  Administered 2023-03-03 – 2023-03-05 (×14): 4 mg via ORAL
  Administered 2023-03-06: 2 mg via ORAL
  Administered 2023-03-06: 4 mg via ORAL
  Administered 2023-03-06: 2 mg via ORAL
  Filled 2023-03-02 (×4): qty 2
  Filled 2023-03-02 (×2): qty 1
  Filled 2023-03-02 (×3): qty 2
  Filled 2023-03-02: qty 1
  Filled 2023-03-02 (×11): qty 2

## 2023-03-02 MED ORDER — BUPIVACAINE LIPOSOME 1.3 % IJ SUSP
INTRAMUSCULAR | Status: AC
  Filled 2023-03-02: qty 20

## 2023-03-02 MED ORDER — MAGNESIUM OXIDE -MG SUPPLEMENT 400 (240 MG) MG PO TABS
400.0000 mg | ORAL_TABLET | Freq: Every day | ORAL | Status: DC
  Administered 2023-03-03 – 2023-03-06 (×4): 400 mg via ORAL
  Filled 2023-03-02 (×4): qty 1

## 2023-03-02 MED ORDER — SODIUM CHLORIDE (PF) 0.9 % IJ SOLN
INTRAMUSCULAR | Status: AC
  Filled 2023-03-02: qty 40

## 2023-03-02 MED ORDER — EZETIMIBE 10 MG PO TABS
10.0000 mg | ORAL_TABLET | Freq: Every day | ORAL | Status: DC
  Administered 2023-03-03 – 2023-03-06 (×4): 10 mg via ORAL
  Filled 2023-03-02 (×4): qty 1

## 2023-03-02 MED ORDER — APIXABAN 2.5 MG PO TABS: 2.5000 mg | ORAL_TABLET | Freq: Two times a day (BID) | ORAL | 1 refills | Status: DC

## 2023-03-02 MED ORDER — DEXMEDETOMIDINE HCL IN NACL 80 MCG/20ML IV SOLN
INTRAVENOUS | Status: DC | PRN
  Administered 2023-03-02: 12 ug via INTRAVENOUS

## 2023-03-02 MED ORDER — INSULIN GLARGINE-YFGN 100 UNIT/ML ~~LOC~~ SOLN
35.0000 [IU] | Freq: Every day | SUBCUTANEOUS | Status: DC
  Administered 2023-03-03 – 2023-03-06 (×4): 35 [IU] via SUBCUTANEOUS
  Filled 2023-03-02 (×4): qty 0.35

## 2023-03-02 MED ORDER — PROPOFOL 10 MG/ML IV BOLUS
INTRAVENOUS | Status: AC
  Filled 2023-03-02: qty 20

## 2023-03-02 MED ORDER — TRANEXAMIC ACID-NACL 1000-0.7 MG/100ML-% IV SOLN
INTRAVENOUS | Status: AC
  Filled 2023-03-02: qty 100

## 2023-03-02 MED ORDER — CHLORHEXIDINE GLUCONATE 0.12 % MT SOLN
15.0000 mL | Freq: Once | OROMUCOSAL | Status: AC
  Administered 2023-03-02: 15 mL via OROMUCOSAL
  Filled 2023-03-02: qty 15

## 2023-03-02 MED ORDER — LACTATED RINGERS IV SOLN: INTRAVENOUS | Status: DC

## 2023-03-02 MED ORDER — ACETAMINOPHEN 500 MG PO TABS
1000.0000 mg | ORAL_TABLET | Freq: Four times a day (QID) | ORAL | Status: AC
  Administered 2023-03-02 – 2023-03-03 (×4): 1000 mg via ORAL
  Filled 2023-03-02 (×4): qty 2

## 2023-03-02 MED ORDER — METOCLOPRAMIDE HCL 5 MG PO TABS
5.0000 mg | ORAL_TABLET | Freq: Three times a day (TID) | ORAL | Status: DC | PRN
  Administered 2023-03-04: 10 mg via ORAL
  Filled 2023-03-02: qty 2

## 2023-03-02 MED ORDER — LIDOCAINE HCL (PF) 2 % IJ SOLN
INTRAMUSCULAR | Status: AC
  Filled 2023-03-02: qty 5

## 2023-03-02 MED ORDER — POTASSIUM CHLORIDE IN NACL 20-0.9 MEQ/L-% IV SOLN
INTRAVENOUS | Status: AC
  Filled 2023-03-02 (×2): qty 1000

## 2023-03-02 MED ORDER — RISAQUAD PO CAPS
1.0000 | ORAL_CAPSULE | Freq: Every day | ORAL | Status: DC
  Administered 2023-03-03 – 2023-03-06 (×4): 1 via ORAL
  Filled 2023-03-02 (×4): qty 1

## 2023-03-02 MED ORDER — POLYETHYLENE GLYCOL 3350 17 G PO PACK
17.0000 g | PACK | Freq: Every day | ORAL | Status: DC
  Administered 2023-03-02 – 2023-03-06 (×4): 17 g via ORAL
  Filled 2023-03-02 (×4): qty 1

## 2023-03-02 MED ORDER — PHENOL 1.4 % MT LIQD: 1.0000 | OROMUCOSAL | Status: DC | PRN

## 2023-03-02 MED ORDER — LORATADINE 10 MG PO TABS
10.0000 mg | ORAL_TABLET | Freq: Every day | ORAL | Status: DC
  Administered 2023-03-03 – 2023-03-06 (×4): 10 mg via ORAL
  Filled 2023-03-02 (×4): qty 1

## 2023-03-02 MED ORDER — HYDROMORPHONE HCL 1 MG/ML IJ SOLN: 0.2500 mg | INTRAMUSCULAR | Status: DC | PRN

## 2023-03-02 MED ORDER — SODIUM CHLORIDE 0.9% FLUSH
INTRAVENOUS | Status: DC | PRN
  Administered 2023-03-02: 40 mL

## 2023-03-02 MED ORDER — LORATADINE 10 MG PO TABS: 10.0000 mg | ORAL_TABLET | Freq: Every day | ORAL | Status: DC

## 2023-03-02 SURGICAL SUPPLY — 77 items
AGENT HMST SPONGE THK3/8 (HEMOSTASIS)
ATTUNE MED DOME PAT 38 KNEE (Knees) IMPLANT
ATTUNE PS FEM LT SZ 7 CEM KNEE (Femur) IMPLANT
ATTUNE PSRP INSR SZ7 5 KNEE (Insert) IMPLANT
BAG COUNTER SPONGE SURGICOUNT (BAG) IMPLANT
BAG DECANTER FOR FLEXI CONT (MISCELLANEOUS) ×1 IMPLANT
BAG SPEC THK2 15X12 ZIP CLS (MISCELLANEOUS)
BAG SPNG CNTER NS LX DISP (BAG)
BAG ZIPLOCK 12X15 (MISCELLANEOUS) IMPLANT
BASE TIBIAL ROT PLAT SZ 7 KNEE (Knees) IMPLANT
BLADE SAW SGTL 11.0X1.19X90.0M (BLADE) ×1 IMPLANT
BLADE SAW SGTL 13.0X1.19X90.0M (BLADE) ×1 IMPLANT
BLADE SURG SZ10 CARB STEEL (BLADE) ×2 IMPLANT
BNDG CMPR 5X4 KNIT ELC UNQ LF (GAUZE/BANDAGES/DRESSINGS) ×1
BNDG CMPR 5X62 HK CLSR LF (GAUZE/BANDAGES/DRESSINGS) ×1
BNDG CMPR MED 10X6 ELC LF (GAUZE/BANDAGES/DRESSINGS) ×1
BNDG ELASTIC 4INX 5YD STR LF (GAUZE/BANDAGES/DRESSINGS) ×1 IMPLANT
BNDG ELASTIC 6INX 5YD STR LF (GAUZE/BANDAGES/DRESSINGS) ×1 IMPLANT
BNDG ELASTIC 6X10 VLCR STRL LF (GAUZE/BANDAGES/DRESSINGS) IMPLANT
BOWL SMART MIX CTS (DISPOSABLE) ×1 IMPLANT
BSPLAT TIB 7 CMNT ROT PLAT STR (Knees) ×1 IMPLANT
CEMENT HV SMART SET (Cement) ×2 IMPLANT
COVER SURGICAL LIGHT HANDLE (MISCELLANEOUS) ×1 IMPLANT
CUFF TOURN SGL QUICK 34 (TOURNIQUET CUFF) ×1
CUFF TRNQT CYL 34X4.125X (TOURNIQUET CUFF) ×1 IMPLANT
DRAPE INCISE IOBAN 66X45 STRL (DRAPES) IMPLANT
DRAPE ORTHO SPLIT 77X108 STRL (DRAPES) ×2
DRAPE SHEET LG 3/4 BI-LAMINATE (DRAPES) ×1 IMPLANT
DRAPE SURG ORHT 6 SPLT 77X108 (DRAPES) ×2 IMPLANT
DRAPE TOP 10253 STERILE (DRAPES) ×1 IMPLANT
DRAPE U-SHAPE 47X51 STRL (DRAPES) ×1 IMPLANT
DRSG AQUACEL AG ADV 3.5X10 (GAUZE/BANDAGES/DRESSINGS) ×1 IMPLANT
DRSG TEGADERM 4X4.75 (GAUZE/BANDAGES/DRESSINGS) IMPLANT
DURAPREP 26ML APPLICATOR (WOUND CARE) ×1 IMPLANT
ELECT BLADE TIP CTD 4 INCH (ELECTRODE) IMPLANT
ELECT REM PT RETURN 15FT ADLT (MISCELLANEOUS) ×1 IMPLANT
EVACUATOR 1/8 PVC DRAIN (DRAIN) IMPLANT
GAUZE SPONGE 2X2 8PLY STRL LF (GAUZE/BANDAGES/DRESSINGS) IMPLANT
GLOVE BIO SURGEON STRL SZ7 (GLOVE) ×1 IMPLANT
GLOVE BIOGEL PI IND STRL 7.0 (GLOVE) ×1 IMPLANT
GLOVE BIOGEL PI IND STRL 8 (GLOVE) ×1 IMPLANT
GLOVE SURG SS PI 8.0 STRL IVOR (GLOVE) ×1 IMPLANT
GOWN STRL REUS W/ TWL XL LVL3 (GOWN DISPOSABLE) ×2 IMPLANT
GOWN STRL REUS W/TWL XL LVL3 (GOWN DISPOSABLE) ×2
HANDPIECE INTERPULSE COAX TIP (DISPOSABLE) ×1
HEMOSTAT SPONGE AVITENE ULTRA (HEMOSTASIS) IMPLANT
HOLDER FOLEY CATH W/STRAP (MISCELLANEOUS) IMPLANT
IMMOBILIZER KNEE 20 (SOFTGOODS) ×1
IMMOBILIZER KNEE 20 THIGH 36 (SOFTGOODS) ×1 IMPLANT
KIT TURNOVER KIT A (KITS) IMPLANT
MANIFOLD NEPTUNE II (INSTRUMENTS) ×1 IMPLANT
NS IRRIG 1000ML POUR BTL (IV SOLUTION) IMPLANT
PACK TOTAL KNEE CUSTOM (KITS) ×1 IMPLANT
PIN STEINMAN FIXATION KNEE (PIN) IMPLANT
PROTECTOR NERVE ULNAR (MISCELLANEOUS) ×1 IMPLANT
SAW OSC TIP CART 19.5X105X1.3 (SAW) IMPLANT
SEALER BIPOLAR AQUA 6.0 (INSTRUMENTS) IMPLANT
SET HNDPC FAN SPRY TIP SCT (DISPOSABLE) ×1 IMPLANT
SOLUTION PRONTOSAN WOUND 350ML (IRRIGATION / IRRIGATOR) ×1 IMPLANT
SPIKE FLUID TRANSFER (MISCELLANEOUS) ×1 IMPLANT
STAPLER VISISTAT (STAPLE) IMPLANT
STRIP CLOSURE SKIN 1/2X4 (GAUZE/BANDAGES/DRESSINGS) IMPLANT
SUT BONE WAX W31G (SUTURE) ×1 IMPLANT
SUT MNCRL AB 4-0 PS2 18 (SUTURE) IMPLANT
SUT STRATAFIX 0 PDS 27 VIOLET (SUTURE) ×1
SUT VIC AB 1 CT1 27 (SUTURE) ×3
SUT VIC AB 1 CT1 27XBRD ANTBC (SUTURE) ×3 IMPLANT
SUT VIC AB 2-0 CT1 27 (SUTURE) ×3
SUT VIC AB 2-0 CT1 TAPERPNT 27 (SUTURE) ×3 IMPLANT
SUTURE STRATFX 0 PDS 27 VIOLET (SUTURE) ×1 IMPLANT
SYR 3ML LL SCALE MARK (SYRINGE) IMPLANT
TIBIAL BASE ROT PLAT SZ 7 KNEE (Knees) ×1 IMPLANT
TRAY FOLEY MTR SLVR 16FR STAT (SET/KITS/TRAYS/PACK) ×1 IMPLANT
TUBE SUCTION HIGH CAP CLEAR NV (SUCTIONS) ×1 IMPLANT
WATER STERILE IRR 1000ML POUR (IV SOLUTION) ×1 IMPLANT
WIPE CHG 2% PREP (PERSONAL CARE ITEMS) ×1 IMPLANT
WRAP KNEE MAXI GEL POST OP (GAUZE/BANDAGES/DRESSINGS) ×1 IMPLANT

## 2023-03-02 NOTE — Transfer of Care (Signed)
Immediate Anesthesia Transfer of Care Note  Patient: Roger Guerra  Procedure(s) Performed: TOTAL KNEE ARTHROPLASTY (Left: Knee)  Patient Location: PACU  Anesthesia Type:Spinal  Level of Consciousness: drowsy and patient cooperative  Airway & Oxygen Therapy: Patient Spontanous Breathing and Patient connected to face mask oxygen  Post-op Assessment: Report given to RN and Post -op Vital signs reviewed and stable  Post vital signs: Reviewed and stable  Last Vitals:  Vitals Value Taken Time  BP 107/66 03/02/23 1108  Temp 36.4 C 03/02/23 1108  Pulse 84 03/02/23 1109  Resp 19 03/02/23 1109  SpO2 96 % 03/02/23 1109  Vitals shown include unvalidated device data.  Last Pain:  Vitals:   03/02/23 0701  TempSrc:   PainSc: 2          Complications: No notable events documented.

## 2023-03-02 NOTE — Discharge Instructions (Addendum)
Elevate leg above heart 6x a day for 20minutes each Use knee immobilizer while walking until can SLR x 10 Use knee immobilizer in bed to keep knee in extension Aquacel dressing may remain in place until follow up. May shower with aquacel dressing in place. If the dressing becomes saturated or peels off, you may remove aquacel dressing. Do not remove steri-strips if they are present. Place new dressing with gauze and tape or ACE bandage which should be kept clean and dry and changed daily.   INSTRUCTIONS AFTER JOINT REPLACEMENT   Remove items at home which could result in a fall. This includes throw rugs or furniture in walking pathways ICE to the affected joint every three hours while awake for 30 minutes at a time, for at least the first 3-5 days, and then as needed for pain and swelling.  Continue to use ice for pain and swelling. You may notice swelling that will progress down to the foot and ankle.  This is normal after surgery.  Elevate your leg when you are not up walking on it.   Continue to use the breathing machine you got in the hospital (incentive spirometer) which will help keep your temperature down.  It is common for your temperature to cycle up and down following surgery, especially at night when you are not up moving around and exerting yourself.  The breathing machine keeps your lungs expanded and your temperature down.   DIET:  As you were doing prior to hospitalization, we recommend a well-balanced diet.  DRESSING / WOUND CARE / SHOWERING  Keep the surgical dressing until follow up.  The dressing is water proof, so you can shower without any extra covering.  IF THE DRESSING FALLS OFF or the wound gets wet inside, change the dressing with sterile gauze.  Please use good hand washing techniques before changing the dressing.  Do not use any lotions or creams on the incision until instructed by your surgeon.    ACTIVITY  Increase activity slowly as tolerated, but follow the weight  bearing instructions below.   No driving for 6 weeks or until further direction given by your physician.  You cannot drive while taking narcotics.  No lifting or carrying greater than 10 lbs. until further directed by your surgeon. Avoid periods of inactivity such as sitting longer than an hour when not asleep. This helps prevent blood clots.  You may return to work once you are authorized by your doctor.     WEIGHT BEARING   Weight bearing as tolerated with assist device (walker, cane, etc) as directed, use it as long as suggested by your surgeon or therapist, typically at least 4-6 weeks.   EXERCISES  Results after joint replacement surgery are often greatly improved when you follow the exercise, range of motion and muscle strengthening exercises prescribed by your doctor. Safety measures are also important to protect the joint from further injury. Any time any of these exercises cause you to have increased pain or swelling, decrease what you are doing until you are comfortable again and then slowly increase them. If you have problems or questions, call your caregiver or physical therapist for advice.   Rehabilitation is important following a joint replacement. After just a few days of immobilization, the muscles of the leg can become weakened and shrink (atrophy).  These exercises are designed to build up the tone and strength of the thigh and leg muscles and to improve motion. Often times heat used for twenty to thirty   minutes before working out will loosen up your tissues and help with improving the range of motion but do not use heat for the first two weeks following surgery (sometimes heat can increase post-operative swelling).   These exercises can be done on a training (exercise) mat, on the floor, on a table or on a bed. Use whatever works the best and is most comfortable for you.    Use music or television while you are exercising so that the exercises are a pleasant break in your day.  This will make your life better with the exercises acting as a break in your routine that you can look forward to.   Perform all exercises about fifteen times, three times per day or as directed.  You should exercise both the operative leg and the other leg as well.  Exercises include:   Quad Sets - Tighten up the muscle on the front of the thigh (Quad) and hold for 5-10 seconds.   Straight Leg Raises - With your knee straight (if you were given a brace, keep it on), lift the leg to 60 degrees, hold for 3 seconds, and slowly lower the leg.  Perform this exercise against resistance later as your leg gets stronger.  Leg Slides: Lying on your back, slowly slide your foot toward your buttocks, bending your knee up off the floor (only go as far as is comfortable). Then slowly slide your foot back down until your leg is flat on the floor again.  Angel Wings: Lying on your back spread your legs to the side as far apart as you can without causing discomfort.  Hamstring Strength:  Lying on your back, push your heel against the floor with your leg straight by tightening up the muscles of your buttocks.  Repeat, but this time bend your knee to a comfortable angle, and push your heel against the floor.  You may put a pillow under the heel to make it more comfortable if necessary.   A rehabilitation program following joint replacement surgery can speed recovery and prevent re-injury in the future due to weakened muscles. Contact your doctor or a physical therapist for more information on knee rehabilitation.    CONSTIPATION  Constipation is defined medically as fewer than three stools per week and severe constipation as less than one stool per week.  Even if you have a regular bowel pattern at home, your normal regimen is likely to be disrupted due to multiple reasons following surgery.  Combination of anesthesia, postoperative narcotics, change in appetite and fluid intake all can affect your bowels.   YOU MUST  use at least one of the following options; they are listed in order of increasing strength to get the job done.  They are all available over the counter, and you may need to use some, POSSIBLY even all of these options:    Drink plenty of fluids (prune juice may be helpful) and high fiber foods Colace 100 mg by mouth twice a day  Senokot for constipation as directed and as needed Dulcolax (bisacodyl), take with full glass of water  Miralax (polyethylene glycol) once or twice a day as needed.  If you have tried all these things and are unable to have a bowel movement in the first 3-4 days after surgery call either your surgeon or your primary doctor.    If you experience loose stools or diarrhea, hold the medications until you stool forms back up.  If your symptoms do not get better within   1 week or if they get worse, check with your doctor.  If you experience "the worst abdominal pain ever" or develop nausea or vomiting, please contact the office immediately for further recommendations for treatment.   ITCHING:  If you experience itching with your medications, try taking only a single pain pill, or even half a pain pill at a time.  You can also use Benadryl over the counter for itching or also to help with sleep.   TED HOSE STOCKINGS:  Use stockings on both legs until for at least 2 weeks or as directed by physician office. They may be removed at night for sleeping.  MEDICATIONS:  See your medication summary on the "After Visit Summary" that nursing will review with you.  You may have some home medications which will be placed on hold until you complete the course of blood thinner medication.  It is important for you to complete the blood thinner medication as prescribed.  PRECAUTIONS:  If you experience chest pain or shortness of breath - call 911 immediately for transfer to the hospital emergency department.   If you develop a fever greater that 101 F, purulent drainage from wound, increased  redness or drainage from wound, foul odor from the wound/dressing, or calf pain - CONTACT YOUR SURGEON.                                                   FOLLOW-UP APPOINTMENTS:  If you do not already have a post-op appointment, please call the office for an appointment to be seen by your surgeon.  Guidelines for how soon to be seen are listed in your "After Visit Summary", but are typically between 1-4 weeks after surgery.  OTHER INSTRUCTIONS:   Knee Replacement:  Do not place pillow under knee, focus on keeping the knee straight while resting. CPM instructions: 0-90 degrees, 2 hours in the morning, 2 hours in the afternoon, and 2 hours in the evening. Place foam block, curve side up under heel at all times except when in CPM or when walking.  DO NOT modify, tear, cut, or change the foam block in any way.  POST-OPERATIVE OPIOID TAPER INSTRUCTIONS: It is important to wean off of your opioid medication as soon as possible. If you do not need pain medication after your surgery it is ok to stop day one. Opioids include: Codeine, Hydrocodone(Norco, Vicodin), Oxycodone(Percocet, oxycontin) and hydromorphone amongst others.  Long term and even short term use of opiods can cause: Increased pain response Dependence Constipation Depression Respiratory depression And more.  Withdrawal symptoms can include Flu like symptoms Nausea, vomiting And more Techniques to manage these symptoms Hydrate well Eat regular healthy meals Stay active Use relaxation techniques(deep breathing, meditating, yoga) Do Not substitute Alcohol to help with tapering If you have been on opioids for less than two weeks and do not have pain than it is ok to stop all together.  Plan to wean off of opioids This plan should start within one week post op of your joint replacement. Maintain the same interval or time between taking each dose and first decrease the dose.  Cut the total daily intake of opioids by one tablet each  day Next start to increase the time between doses. The last dose that should be eliminated is the evening dose.   MAKE SURE YOU:  Understand   these instructions.  Get help right away if you are not doing well or get worse.    Thank you for letting us be a part of your medical care team.  It is a privilege we respect greatly.  We hope these instructions will help you stay on track for a fast and full recovery!     Information on my medicine - ELIQUIS (apixaban)  Why was Eliquis prescribed for you? Eliquis was prescribed for you to reduce the risk of blood clots forming after orthopedic surgery.    What do You need to know about Eliquis? Take your Eliquis TWICE DAILY - one tablet in the morning and one tablet in the evening with or without food.  It would be best to take the dose about the same time each day.  If you have difficulty swallowing the tablet whole please discuss with your pharmacist how to take the medication safely.  Take Eliquis exactly as prescribed by your doctor and DO NOT stop taking Eliquis without talking to the doctor who prescribed the medication.  Stopping without other medication to take the place of Eliquis may increase your risk of developing a clot.  After discharge, you should have regular check-up appointments with your healthcare provider that is prescribing your Eliquis.  What do you do if you miss a dose? If a dose of ELIQUIS is not taken at the scheduled time, take it as soon as possible on the same day and twice-daily administration should be resumed.  The dose should not be doubled to make up for a missed dose.  Do not take more than one tablet of ELIQUIS at the same time.  Important Safety Information A possible side effect of Eliquis is bleeding. You should call your healthcare provider right away if you experience any of the following: Bleeding from an injury or your nose that does not stop. Unusual colored urine (red or dark brown) or  unusual colored stools (red or black). Unusual bruising for unknown reasons. A serious fall or if you hit your head (even if there is no bleeding).  Some medicines may interact with Eliquis and might increase your risk of bleeding or clotting while on Eliquis. To help avoid this, consult your healthcare provider or pharmacist prior to using any new prescription or non-prescription medications, including herbals, vitamins, non-steroidal anti-inflammatory drugs (NSAIDs) and supplements.  This website has more information on Eliquis (apixaban): http://www.eliquis.com/eliquis/home  

## 2023-03-02 NOTE — Anesthesia Procedure Notes (Signed)
Spinal  Patient location during procedure: OR Start time: 03/02/2023 8:33 AM End time: 03/02/2023 8:37 AM Reason for block: surgical anesthesia Staffing Performed: anesthesiologist  Anesthesiologist: Mal Amabile, MD Performed by: Mal Amabile, MD Authorized by: Mal Amabile, MD   Preanesthetic Checklist Completed: patient identified, IV checked, site marked, risks and benefits discussed, surgical consent, monitors and equipment checked, pre-op evaluation and timeout performed Spinal Block Patient position: sitting Prep: DuraPrep and site prepped and draped Patient monitoring: heart rate, cardiac monitor, continuous pulse ox and blood pressure Approach: midline Location: L3-4 Injection technique: single-shot Needle Needle type: Sprotte and Pencan  Needle gauge: 24 G Needle length: 9 cm Needle insertion depth: 7 cm Assessment Sensory level: T6 Events: CSF return Additional Notes Patient tolerated procedure well. Adequate sensory level.

## 2023-03-02 NOTE — Op Note (Signed)
NAME: Roger Guerra, Roger Guerra MEDICAL RECORD NO: [id number] ACCOUNT NO: [id number] DATE OF BIRTH: 09-Oct-1955 FACILITY: [facility] LOCATION: [facility] PHYSICIAN: Javier Docker, MD  Operative Report   DATE OF PROCEDURE: 03/02/2023  PREOPERATIVE DIAGNOSIS:  End-stage osteoarthrosis medial compartment, patellofemoral joint, left knee.  POSTOPERATIVE DIAGNOSIS:  End-stage osteoarthrosis medial compartment, patellofemoral joint, left knee.  PROCEDURE PERFORMED:  Left total knee arthroplasty utilizing Attune rotating platform 7 femur, 7 tibia, 5 mm insert, 38 patella.  ANESTHESIA:  Spinal.  ASSISTANT:  Andrez Grime, PA.  HISTORY:  A 67 year old male with end-stage osteoarthrosis medial compartment bone-on-bone indicated for replacement of the degenerated joint.  Risks and benefits discussed including bleeding, infection, damage to neurovascular structures, no change in  symptoms, worsening symptoms, DVT, PE, anesthetic complications, etc.  DESCRIPTION OF PROCEDURE:  With the patient in supine position, after induction of adequate spinal anesthesia, 2 grams Kefzol left lower extremity was prepped and draped and exsanguinated in usual sterile fashion.  Thigh tourniquet inflated to 220 mmHg.   Midline incision was then made over the knee.  Full thickness flaps were developed.  Median parapatellar arthrotomy was performed.  Soft tissue elevated medially, preserving the MCL.  Patella was gently everted, knee was flexed bone-on-bone arthrosis  noted medial compartment in the patellofemoral joint.  Leksell rongeur utilized to remove the osteophytes and remnants of the medial and lateral meniscus as well as the ACL.  Leksell was utilized to make a notch above the femoral notch as a starting  point for the femoral drill, which was drilled in line with the femur.  Entering the femur, irrigating that I chose a 5-degree left, 9 off the distal femur for a distal femoral cut.  This was then performed.  The patient had  fairly hard bone.  Following  this, I then turned attention to the tibia, subluxed the tibia.  Remnants of the menisci were removed.  The low side was medially.  We took 3 off the low side.  External alignment guide, bisecting the tibiotalar joint 3-degree slope parallel to the  shaft.  This was pinned and I performed the cut, protecting soft tissues posteriorly at all times.  I then tried our extension gap, which was full at, but slightly tight a 6 mm insert, full at 5 mm.  Knee was then flexed again, turned attention back to  the femur, sized the femur off the anterior cortex to be at 7 and 3 degrees of external rotation.  This was pinned placed our block and performed a distal femoral cut.  Anterior, posterior and chamfer cuts.  Soft tissue protected posteriorly at all  times.  Again, very hard bone.  We then turned our attention back to the tibia, sized to a 5 just the medial aspect of tibial tubercle.  This was pinned, I harvested bone centrally and impacted into the distal femur.  I then drilled centrally, using our  punch guide.  Following this, I placed a trial femur, it was slightly tight, removed it.  We had to plane some of the eburnated bone off the anterior cortex and the posterior condyles.  I re-impacted the femur.  It sat flush.  I placed a 5 mm insert,  reduced it had full extension, full flexion, good stability to varus valgus stress at 0-30 degrees, negative anterior drawer.  Cauterized the geniculates.  I then everted the patella, was measured to a 24 planed it to a 14, utilizing the patellar jig.   It was then sized to a 38 medializing  our her peg holes with the trial paddle parallel to the joint line and drilled our holes, placed a trial 38 patella, reduced it and had a good patellofemoral tracking, slightly lateral.  I then everted the patella,  performed lateral release from the inside and then re-reduced it and I had excellent patellofemoral tracking.  All components were then  removed.  I used pulsatile lavage to thoroughly clean the joint in all bony surfaces.  Cauterized the geniculates.   Checked posteriorly capsule and popliteus were intact.  Flexed knee, subluxed the tibia, thoroughly dried all surfaces.  Mixed cement on back table in the appropriate fashion.  I then placed cement in the tibial canal, digitally pressurizing the cement.   Cement was placed on the permanent tibial component and impacted into place with redundant cement removed.  I cemented and impacted the femur with cement on the femur and the component.  Placed a 5 trial insert, reduced it, held axial load throughout  the curing of the cement in extension.  Cemented and clamped the patella, placed Marcaine with epinephrine into the joint as well as Prontosan and covered the wound during the curing of the cement.  After curing the cement, tourniquet was deflated at 70  minutes.  I had moderate bleeding from the medial posterior joint line.  I used the electrocautery initially without avail.  I then flexed the knee, achieved hemostasis, selected Aquamantys for electrocautery once this is available.  I then began  cauterizing the posterolateral, posteromedial joint line and in flexion and then in extension as well.  I removed the insert.  Cauterizing posteromedial and posterolateral capsule.  This was done in flexion with the help of the lamina distractor.  I then  copiously irrigated with pulsatile lavage, meticulously removed all redundant cement.  We then flexed the knee again, used the Exparel through the medial capsule aspirating first without breaking the vacuum and injecting about 10 mL. This was in the  proximal tibia as well and over the periosteum of the femur quadriceps tendon.  Then, flexed the knee.  Pulsatile lavage and thoroughly cleaned all surfaces.  We then irrigated with Prontosan and selected a 5 permanent insert, reduced the knee, had full  flexion, good extension, good stability with varus  and valgus stressing at 0-30 degrees, negative anterior drawer.  There is no active bleeding.  I then reapproximated the patellar arthrotomy with towel clips and slight flexion, I then reapproximated the  arthrotomy with 1 Vicryl in interrupted figure-of-eight sutures oversewn with a running Stratafix.  Following this, I had excellent patellofemoral tracking.  No active bleeding.  Copiously irrigated with pulsatile lavage once again and Prontosan.  After  closure, the remainder of the Exparel was placed in the joint.  Closed the subcutaneous with 2-0 in multiple layers and skin with staples, irrigating throughout.  Sterile dressing applied, placed in immobilizer, extubated, and transported to the  recovery room in satisfactory condition.  The patient tolerated the procedure well.  No complications.  ASSISTANT:  Andrez Grime, PA, was used throughout the case for patient positioning, retraction, closure, exposure.  BLOOD LOSS:  100 mL.   PUS D: 03/02/2023 10:57:02 am T: 03/02/2023 11:38:00 am  JOB: [id number]/ [id number]

## 2023-03-02 NOTE — Anesthesia Procedure Notes (Signed)
Anesthesia Regional Block: Adductor canal block   Pre-Anesthetic Checklist: , timeout performed,  Correct Patient, Correct Site, Correct Laterality,  Correct Procedure, Correct Position, site marked,  Risks and benefits discussed,  Surgical consent,  Pre-op evaluation,  At surgeon's request and post-op pain management  Laterality: Left  Prep: chloraprep       Needles:  Injection technique: Single-shot  Needle Type: Echogenic Stimulator Needle     Needle Length: 10cm  Needle Gauge: 21   Needle insertion depth: 8 cm   Additional Needles:   Narrative:  Start time: 03/02/2023 8:10 AM End time: 03/02/2023 8:15 AM Injection made incrementally with aspirations every 5 mL.  Performed by: Personally  Anesthesiologist: Mal Amabile, MD  Additional Notes: Timeout performed. Patient sedated. Relevant anatomy ID'd using [place]. Incremental 2-58ml injection of LA with frequent aspiration. Patient tolerated procedure well.     Left Adductor Canal Block

## 2023-03-02 NOTE — Brief Op Note (Addendum)
03/02/2023  8:23 AM  PATIENT:  Roger Guerra  67 y.o. male  PRE-OPERATIVE DIAGNOSIS:  Left knee degenerative joint disease  POST-OPERATIVE DIAGNOSIS:  * No post-op diagnosis entered *  PROCEDURE:  Procedure(s): TOTAL KNEE ARTHROPLASTY (Left)  The aquamantis was utilized for this case to help facilitate better hemostasis as patient was felt to be at increased risk of bleeding because of history of coagulopathy and recent anticoagulation use.   SURGEON:  Surgeon(s) and Role:    * Jene Every, MD - Primary  PHYSICIAN ASSISTANT:   ASSISTANTS: Bissell   ANESTHESIA:   spinal  EBL:  100  BLOOD ADMINISTERED:none  DRAINS: none   LOCAL MEDICATIONS USED:  MARCAINE     SPECIMEN:  No Specimen  DISPOSITION OF SPECIMEN:  N/A  COUNTS:  YES  TOURNIQUET:  70 min  DICTATION: .Other Dictation: Dictation Number [id number]  PLAN OF CARE: Admit for overnight observation  PATIENT DISPOSITION:  PACU - hemodynamically stable.   Delay start of Pharmacological VTE agent (>24hrs) due to surgical blood loss or risk of bleeding: no

## 2023-03-02 NOTE — Evaluation (Signed)
Physical Therapy Evaluation Patient Details Name: Roger Guerra MRN: [id number] DOB: Feb 10, 1956 Today's Date: 03/02/2023  History of Present Illness  67 y.o. male admitted for L TKA 03/02/23. PMH: DM, depression/anxiety, DVT, spinal stenosis, OSA, obesity.  Clinical Impression  Pt is s/p TKA resulting in the deficits listed below (see PT Problem List). Supine to sit with mod A, sit to stand from elevated bed min A. Pt stood for ~30 seconds with RW then became lightheaded which was intensifying, so was assisted back to bed where vital signs were stable. Initiated TKA HEP.  Pt will benefit from acute skilled PT to increase their independence and safety with mobility to allow discharge.         Recommendations for follow up therapy are one component of a multi-disciplinary discharge planning process, led by the attending physician.  Recommendations may be updated based on patient status, additional functional criteria and insurance authorization.  Follow Up Recommendations       Assistance Recommended at Discharge Frequent or constant Supervision/Assistance  Patient can return home with the following  A little help with walking and/or transfers;A little help with bathing/dressing/bathroom;Assistance with cooking/housework;Assist for transportation;Help with stairs or ramp for entrance    Equipment Recommendations Rolling walker (2 wheels)  Recommendations for Other Services       Functional Status Assessment Patient has had a recent decline in their functional status and demonstrates the ability to make significant improvements in function in a reasonable and predictable amount of time.     Precautions / Restrictions        Mobility  Bed Mobility Overal bed mobility: Needs Assistance Bed Mobility: Supine to Sit, Sit to Supine     Supine to sit: Mod assist Sit to supine: Mod assist   General bed mobility comments: assist to raise trunk and support LLE; then assist for BLEs into  bed    Transfers Overall transfer level: Needs assistance Equipment used: Rolling walker (2 wheels) Transfers: Sit to/from Stand Sit to Stand: Min assist, From elevated surface           General transfer comment: VCs hand placement, min A to power up; pt reported feeling lightheaded after standing ~30 seconds, so returned to bed where vital signs were stable    Ambulation/Gait                  Stairs            Wheelchair Mobility    Modified Rankin (Stroke Patients Only)       Balance Overall balance assessment: Needs assistance Sitting-balance support: Feet supported, No upper extremity supported Sitting balance-Leahy Scale: Good     Standing balance support: Bilateral upper extremity supported, During functional activity, Reliant on assistive device for balance Standing balance-Leahy Scale: Poor                               Pertinent Vitals/Pain Pain Assessment Pain Assessment: 0-10 Pain Score: 8  Pain Location: L knee Pain Descriptors / Indicators: Sore Pain Intervention(s): Limited activity within patient's tolerance, Monitored during session, Premedicated before session, Ice applied    Home Living Family/patient expects to be discharged to:: Private residence Living Arrangements: Spouse/significant other Available Help at Discharge: Family;Available 24 hours/day Type of Home: House Home Access: Stairs to enter Entrance Stairs-Rails: Right Entrance Stairs-Number of Steps: 2   Home Layout: One level Home Equipment: Cane - single point;Grab bars - tub/shower  Prior Function Prior Level of Function : Independent/Modified Independent             Mobility Comments: walked with SPC ADLs Comments: independent     Hand Dominance        Extremity/Trunk Assessment   Upper Extremity Assessment Upper Extremity Assessment: Overall WFL for tasks assessed    Lower Extremity Assessment Lower Extremity Assessment: LLE  deficits/detail LLE: Unable to fully assess due to pain LLE Sensation: WNL    Cervical / Trunk Assessment Cervical / Trunk Assessment: Normal  Communication   Communication: No difficulties  Cognition Arousal/Alertness: Awake/alert Behavior During Therapy: WFL for tasks assessed/performed Overall Cognitive Status: Within Functional Limits for tasks assessed                                          General Comments      Exercises Total Joint Exercises Ankle Circles/Pumps: AROM, Both, 10 reps, Supine Heel Slides: AAROM, Left, 5 reps, Supine Goniometric ROM: 0-25* AAROM L knee   Assessment/Plan    PT Assessment Patient needs continued PT services  PT Problem List Decreased range of motion;Decreased activity tolerance;Decreased strength;Decreased mobility;Decreased balance;Pain;Decreased knowledge of use of DME       PT Treatment Interventions DME instruction;Therapeutic activities;Gait training;Therapeutic exercise;Stair training;Functional mobility training;Patient/family education    PT Goals (Current goals can be found in the Care Plan section)  Acute Rehab PT Goals Patient Stated Goal: decrease pain PT Goal Formulation: With patient/family Time For Goal Achievement: 03/09/23 Potential to Achieve Goals: Fair    Frequency 7X/week     Co-evaluation               AM-PAC PT "6 Clicks" Mobility  Outcome Measure Help needed turning from your back to your side while in a flat bed without using bedrails?: A Little Help needed moving from lying on your back to sitting on the side of a flat bed without using bedrails?: A Lot Help needed moving to and from a bed to a chair (including a wheelchair)?: A Lot Help needed standing up from a chair using your arms (e.g., wheelchair or bedside chair)?: A Lot Help needed to walk in hospital room?: Total Help needed climbing 3-5 steps with a railing? : Total 6 Click Score: 11    End of Session Equipment  Utilized During Treatment: Gait belt Activity Tolerance: Patient limited by pain;Treatment limited secondary to medical complications (Comment) (lightheadedness) Patient left: in bed;with call bell/phone within reach;with bed alarm set;with family/visitor present Nurse Communication: Mobility status PT Visit Diagnosis: Pain;Difficulty in walking, not elsewhere classified (R26.2);Muscle weakness (generalized) (M62.81) Pain - Right/Left: Left Pain - part of body: Knee    Time: 4034-7425 PT Time Calculation (min) (ACUTE ONLY): 31 min   Charges:   PT Evaluation $PT Eval Moderate Complexity: 1 Mod PT Treatments $Therapeutic Activity: 8-22 mins        Ralene Bathe Kistler PT 03/02/2023  Acute Rehabilitation Services  Office [phone]

## 2023-03-02 NOTE — Anesthesia Postprocedure Evaluation (Signed)
Anesthesia Post Note  Patient: Roger Guerra  Procedure(s) Performed: TOTAL KNEE ARTHROPLASTY (Left: Knee)     Patient location during evaluation: PACU Anesthesia Type: Spinal Level of consciousness: oriented and awake and alert Pain management: pain level controlled Vital Signs Assessment: post-procedure vital signs reviewed and stable Respiratory status: spontaneous breathing, respiratory function stable and nonlabored ventilation Cardiovascular status: blood pressure returned to baseline and stable Postop Assessment: no headache, no backache, no apparent nausea or vomiting, patient able to bend at knees and spinal receding Anesthetic complications: no   No notable events documented.  Last Vitals:  Vitals:   03/02/23 1151 03/02/23 1200  BP:  135/80  Pulse: 73 72  Resp: 18 15  Temp:    SpO2: 95% 94%    Last Pain:  Vitals:   03/02/23 1200  TempSrc:   PainSc: 0-No pain    LLE Motor Response: No movement due to regional block (03/02/23 1200) LLE Sensation: Decreased;Numbness (03/02/23 1200) RLE Motor Response: No movement due to regional block (03/02/23 1200) RLE Sensation: Decreased;Numbness (03/02/23 1200) L Sensory Level: L2-Upper inner thigh, upper buttock (03/02/23 1200) R Sensory Level: L2-Upper inner thigh, upper buttock (03/02/23 1200)  Petronella Shuford A.

## 2023-03-02 NOTE — Progress Notes (Signed)
PT Cancellation Note  Patient Details Name: Roger Guerra MRN: [id number] DOB: [birth year]   Cancelled Treatment:    Reason Eval/Treat Not Completed: Pain limiting ability to participate (pt tremulous and with increased respiratory rate 2* severe pain in L knee, RN notified. Will follow.)   Tamala Ser PT 03/02/2023  Acute Rehabilitation Services  Office [phone]

## 2023-03-02 NOTE — Interval H&P Note (Signed)
History and Physical Interval Note:  03/02/2023 8:22 AM  Roger Guerra  has presented today for surgery, with the diagnosis of Left knee degenerative joint disease.  The various methods of treatment have been discussed with the patient and family. After consideration of risks, benefits and other options for treatment, the patient has consented to  Procedure(s): TOTAL KNEE ARTHROPLASTY (Left) as a surgical intervention.  The patient's history has been reviewed, patient examined, no change in status, stable for surgery.  I have reviewed the patient's chart and labs.  Questions were answered to the patient's satisfaction.     Javier Docker

## 2023-03-03 ENCOUNTER — Encounter (HOSPITAL_COMMUNITY): Payer: Self-pay | Admitting: Specialist

## 2023-03-03 LAB — CBC
HCT: 42.8 % (ref 39.0–52.0)
Hemoglobin: 14.7 g/dL (ref 13.0–17.0)
MCH: 30.8 pg (ref 26.0–34.0)
MCHC: 34.3 g/dL (ref 30.0–36.0)
MCV: 89.7 fL (ref 80.0–100.0)
Platelets: 205 K/uL (ref 150–400)
RBC: 4.77 MIL/uL (ref 4.22–5.81)
RDW: 14 % (ref 11.5–15.5)
WBC: 16.3 K/uL — ABNORMAL HIGH (ref 4.0–10.5)
nRBC: 0 % (ref 0.0–0.2)

## 2023-03-03 LAB — BASIC METABOLIC PANEL
Anion gap: 9 (ref 5–15)
BUN: 9 mg/dL (ref 8–23)
CO2: 21 mmol/L — ABNORMAL LOW (ref 22–32)
Calcium: 8.3 mg/dL — ABNORMAL LOW (ref 8.9–10.3)
Chloride: 100 mmol/L (ref 98–111)
Creatinine, Ser: 0.79 mg/dL (ref 0.61–1.24)
GFR, Estimated: >60 mL/min (ref >60)
Glucose, Bld: 186 mg/dL — ABNORMAL HIGH (ref 70–99)
Potassium: 3.4 mmol/L — ABNORMAL LOW (ref 3.5–5.1)
Sodium: 130 mmol/L — ABNORMAL LOW (ref 135–145)

## 2023-03-03 LAB — GLUCOSE, CAPILLARY
Glucose-Capillary: 154 mg/dL — ABNORMAL HIGH (ref 70–99)
Glucose-Capillary: 193 mg/dL — ABNORMAL HIGH (ref 70–99)
Glucose-Capillary: 209 mg/dL — ABNORMAL HIGH (ref 70–99)
Glucose-Capillary: 181 mg/dL — ABNORMAL HIGH (ref 70–99)

## 2023-03-03 MED ORDER — KETOROLAC TROMETHAMINE 15 MG/ML IJ SOLN
15.0000 mg | Freq: Once | INTRAMUSCULAR | Status: AC
  Administered 2023-03-03: 15 mg via INTRAVENOUS
  Filled 2023-03-03: qty 1

## 2023-03-03 NOTE — Progress Notes (Addendum)
Physical Therapy Treatment Patient Details Name: Roger Guerra MRN: [id number] DOB: 05/30/1956 Today's Date: 03/03/2023   History of Present Illness 67 y.o. male admitted for L TKA 03/02/23. PMH: DM, depression/anxiety, DVT, spinal stenosis, OSA, obesity.    PT Comments    Pt ambulated 5' with RW, distance limited by pain and fatigue. No lightheadedness this session. He is not yet ready to DC home from a PT standpoint.     Recommendations for follow up therapy are one component of a multi-disciplinary discharge planning process, led by the attending physician.  Recommendations may be updated based on patient status, additional functional criteria and insurance authorization.  Follow Up Recommendations       Assistance Recommended at Discharge Frequent or constant Supervision/Assistance  Patient can return home with the following A little help with walking and/or transfers;A little help with bathing/dressing/bathroom;Assistance with cooking/housework;Assist for transportation;Help with stairs or ramp for entrance   Equipment Recommendations  Rolling walker (2 wheels)    Recommendations for Other Services       Precautions / Restrictions Precautions Precautions: Knee Precaution Booklet Issued: Yes (comment) Precaution Comments: reviewed no pillow under knee Restrictions Weight Bearing Restrictions: No     Mobility  Bed Mobility Overal bed mobility: Needs Assistance Bed Mobility: Supine to Sit     Supine to sit: Mod assist Sit to supine: Mod assist   General bed mobility comments: assist to raise trunk and support LLE    Transfers Overall transfer level: Needs assistance Equipment used: Rolling walker (2 wheels) Transfers: Sit to/from Stand Sit to Stand: Min assist, From elevated surface           General transfer comment: VCs hand placement, min A to power up    Ambulation/Gait Ambulation/Gait assistance: Min guard Gait Distance (Feet): 5 Feet Assistive  device: Rolling walker (2 wheels) Gait Pattern/deviations: Step-to pattern, Decreased step length - right, Decreased step length - left Gait velocity: decr     General Gait Details: VCs sequencing   Stairs             Wheelchair Mobility    Modified Rankin (Stroke Patients Only)       Balance Overall balance assessment: Needs assistance Sitting-balance support: Feet supported, No upper extremity supported Sitting balance-Leahy Scale: Good     Standing balance support: Bilateral upper extremity supported, During functional activity, Reliant on assistive device for balance Standing balance-Leahy Scale: Poor                              Cognition Arousal/Alertness: Awake/alert Behavior During Therapy: WFL for tasks assessed/performed Overall Cognitive Status: Within Functional Limits for tasks assessed                                          Exercises Total Joint Exercises Ankle Circles/Pumps: AROM, Both, 10 reps, Supine Quad Sets: AROM, Both, 5 reps, Supine  Heel Slides: AAROM, Left, Supine, 10 reps  Goniometric ROM: 0-30* AAROM L knee    General Comments        Pertinent Vitals/Pain Pain Assessment Pain Score: 8  Pain Location: L knee Pain Descriptors / Indicators: Sore Pain Intervention(s): Limited activity within patient's tolerance, Monitored during session, Premedicated before session, Ice applied    Home Living  Prior Function            PT Goals (current goals can now be found in the care plan section) Acute Rehab PT Goals Patient Stated Goal: decrease pain PT Goal Formulation: With patient/family Time For Goal Achievement: 03/09/23 Potential to Achieve Goals: Fair Progress towards PT goals: Progressing toward goals    Frequency    7X/week      PT Plan Current plan remains appropriate    Co-evaluation              AM-PAC PT "6 Clicks" Mobility   Outcome  Measure  Help needed turning from your back to your side while in a flat bed without using bedrails?: A Little Help needed moving from lying on your back to sitting on the side of a flat bed without using bedrails?: A Lot Help needed moving to and from a bed to a chair (including a wheelchair)?: A Little Help needed standing up from a chair using your arms (e.g., wheelchair or bedside chair)?: A Lot Help needed to walk in hospital room?: A Little Help needed climbing 3-5 steps with a railing? : Total 6 Click Score: 14    End of Session Equipment Utilized During Treatment: Gait belt Activity Tolerance: Patient limited by pain Patient left: in bed;with call bell/phone within reach;with bed alarm set;with family/visitor present Nurse Communication: Mobility status PT Visit Diagnosis: Pain;Difficulty in walking, not elsewhere classified (R26.2);Muscle weakness (generalized) (M62.81) Pain - Right/Left: Left Pain - part of body: Knee     Time: 0919-0950 PT Time Calculation (min) (ACUTE ONLY): 31 min  Charges:  $Gait Training: 8-22 mins                     Ralene Bathe Kistler PT 03/03/2023  Acute Rehabilitation Services  Office [phone]

## 2023-03-03 NOTE — Progress Notes (Signed)
Physical Therapy Treatment Patient Details Name: Roger Guerra MRN: [id number] DOB: 09-07-1956 Today's Date: 03/03/2023   History of Present Illness 67 y.o. male admitted for L TKA 03/02/23. PMH: DM, depression/anxiety, DVT, spinal stenosis, OSA, obesity.    PT Comments    Pt reports 9/10 pain L knee, he had pain medication prior to PT session. Mod assist for supine to sit, and for sit to stand from elevated bed. In standing pt became very lightheaded, eyes began to roll back, assisted pt back to bed where BP was 152/84, SpO2 88% on room air (applied 2L O2 and SpO2 came up to 94%), HR 94. Instructed pt/spouse in TKA HEP. He is not able to progress with mobility due to significant lightheadedness is standing, he could not stand long enough to obtain a BP.    Recommendations for follow up therapy are one component of a multi-disciplinary discharge planning process, led by the attending physician.  Recommendations may be updated based on patient status, additional functional criteria and insurance authorization.  Follow Up Recommendations       Assistance Recommended at Discharge Frequent or constant Supervision/Assistance  Patient can return home with the following A little help with walking and/or transfers;A little help with bathing/dressing/bathroom;Assistance with cooking/housework;Assist for transportation;Help with stairs or ramp for entrance   Equipment Recommendations  Rolling walker (2 wheels)    Recommendations for Other Services       Precautions / Restrictions Precautions Precautions: Knee Precaution Booklet Issued: Yes (comment) Precaution Comments: reviewed no pillow under knee Restrictions Weight Bearing Restrictions: No     Mobility  Bed Mobility Overal bed mobility: Needs Assistance Bed Mobility: Supine to Sit, Sit to Supine     Supine to sit: Mod assist Sit to supine: Mod assist   General bed mobility comments: assist to raise trunk and support LLE; then  assist for BLEs into bed    Transfers Overall transfer level: Needs assistance Equipment used: Rolling walker (2 wheels) Transfers: Sit to/from Stand Sit to Stand: Min assist, From elevated surface           General transfer comment: VCs hand placement, min A to power up; pt reported feeling lightheaded after standing ~30 seconds, so returned to bed where BP was 152/84, HR 94, SpO2 88% on room air so applied 2L O2 [place], then up to 94%    Ambulation/Gait                   Stairs             Wheelchair Mobility    Modified Rankin (Stroke Patients Only)       Balance Overall balance assessment: Needs assistance Sitting-balance support: Feet supported, No upper extremity supported Sitting balance-Leahy Scale: Good     Standing balance support: Bilateral upper extremity supported, During functional activity, Reliant on assistive device for balance Standing balance-Leahy Scale: Poor                              Cognition Arousal/Alertness: Awake/alert Behavior During Therapy: WFL for tasks assessed/performed Overall Cognitive Status: Within Functional Limits for tasks assessed                                          Exercises Total Joint Exercises Ankle Circles/Pumps: AROM, Both, 10 reps, Supine Quad Sets: AROM, Both, 5  reps, Supine Short Arc Quad: AAROM, Left, 5 reps, Supine Heel Slides: AAROM, Left, 5 reps, Supine Hip ABduction/ADduction: AAROM, Left, 5 reps, Supine Goniometric ROM: 0-30* AAROM L knee    General Comments        Pertinent Vitals/Pain Pain Assessment Pain Score: 9  Pain Location: L knee Pain Descriptors / Indicators: Sore Pain Intervention(s): Limited activity within patient's tolerance, Monitored during session, Premedicated before session, Ice applied    Home Living                          Prior Function            PT Goals (current goals can now be found in the care plan  section) Acute Rehab PT Goals Patient Stated Goal: decrease pain PT Goal Formulation: With patient/family Time For Goal Achievement: 03/09/23 Potential to Achieve Goals: Fair Progress towards PT goals: Progressing toward goals    Frequency    7X/week      PT Plan Current plan remains appropriate    Co-evaluation              AM-PAC PT "6 Clicks" Mobility   Outcome Measure  Help needed turning from your back to your side while in a flat bed without using bedrails?: A Little Help needed moving from lying on your back to sitting on the side of a flat bed without using bedrails?: A Lot Help needed moving to and from a bed to a chair (including a wheelchair)?: A Lot Help needed standing up from a chair using your arms (e.g., wheelchair or bedside chair)?: A Lot Help needed to walk in hospital room?: Total Help needed climbing 3-5 steps with a railing? : Total 6 Click Score: 11    End of Session Equipment Utilized During Treatment: Gait belt Activity Tolerance: Patient limited by pain;Treatment limited secondary to medical complications (Comment) (lightheadedness in standing) Patient left: in bed;with call bell/phone within reach;with bed alarm set;with family/visitor present Nurse Communication: Mobility status PT Visit Diagnosis: Pain;Difficulty in walking, not elsewhere classified (R26.2);Muscle weakness (generalized) (M62.81) Pain - Right/Left: Left Pain - part of body: Knee     Time: 0919-0950 PT Time Calculation (min) (ACUTE ONLY): 31 min  Charges:  $Therapeutic Exercise: 8-22 mins $Therapeutic Activity: 8-22 mins                     Ralene Bathe Kistler PT 03/03/2023  Acute Rehabilitation Services  Office [phone]

## 2023-03-03 NOTE — Progress Notes (Addendum)
Subjective: 1 Day Post-Op Procedure(s) (LRB): TOTAL KNEE ARTHROPLASTY (Left) Patient reports pain as 4 on 0-10 scale.   Denies CP or SOB.  Voiding without difficulty. Positive flatus. Objective: Vital signs in last 24 hours: Temp:  [97.6 F (36.4 C)-99.7 F (37.6 C)] 99.2 F (37.3 C) (06/06 0542) Pulse Rate:  [63-103] 103 (06/06 0542) Resp:  [12-18] 18 (06/06 0542) BP: (107-173)/(65-89) 141/84 (06/06 0542) SpO2:  [93 %-100 %] 96 % (06/06 0542)  Intake/Output from previous day: 06/05 0701 - 06/06 0700 In: 3666.5 [P.O.:1320; I.V.:2046.5; IV Piggyback:300] Out: 3200 [Urine:3100; Blood:100] Intake/Output this shift: No intake/output data recorded.  Recent Labs    03/03/23 0345  HGB 14.7   Recent Labs    03/03/23 0345  WBC 16.3*  RBC 4.77  HCT 42.8  PLT 205   Recent Labs    03/03/23 0345  NA 130*  K 3.4*  CL 100  CO2 21*  BUN 9  CREATININE 0.79  GLUCOSE 186*  CALCIUM 8.3*   No results for input(s): "LABPT", "INR" in the last 72 hours.  Neurologically intact ABD soft Neurovascular intact Sensation intact distally Intact pulses distally Dorsiflexion/Plantar flexion intact Incision: dressing C/D/I Compartment soft No DVT  Assessment/Plan:  1 Day Post-Op Procedure(s) (LRB): TOTAL KNEE ARTHROPLASTY (Left) Advance diet Up with therapy Plan for discharge tomorrow   Principal Problem:   Left knee DJD      Roger Guerra 03/03/2023, @[facility] 

## 2023-03-03 NOTE — Care Plan (Signed)
Ortho Bundle Case Management Note  Patient Details  Name: Roger Guerra MRN: [id number] Date of Birth: [birth year]                  L TKA on 03-02-23  DCP: Home with wife  DME: RW ordered through [employer]  PT: Kevan Ny on 03-07-23   DME Arranged:  Dan Humphreys rolling DME Agency:  [employer]    Additional Comments: Please contact me with any questions of if this plan should need to change.   Ennis Forts, RN,CCM [facility]  [phone] 03/03/2023, 9:58 AM

## 2023-03-03 NOTE — Progress Notes (Signed)
Placed patient on CPAP for the night. Patient provided home nasal pillows and tubing.

## 2023-03-03 NOTE — TOC Transition Note (Signed)
Transition of Care [facility]) - CM/SW Discharge Note  Patient Details  Name: Roger Guerra MRN: [id number] Date of Birth: 04/25/1956  [facility] of [facility]) CM/SW Contact:  Ewing Schlein, LCSW Phone Number: 03/03/2023, 9:56 AM  Clinical Narrative: Patient is expected to discharge home after working with PT. CSW met with patient to confirm discharge plan and needs. Patient will go home with OPPT at [facility]. Patient will need a rolling walker, which [employer] delivered to patient's room. TOC signing off.  Final next level of care: OP Rehab Barriers to Discharge: No Barriers Identified  Patient Goals and CMS Choice CMS Medicare.gov Compare Post Acute Care list provided to:: Patient Choice offered to / list presented to : Patient  Discharge Plan and Services Additional resources added to the After Visit Summary for        DME Arranged: Walker rolling DME Agency: [employer] Date DME Agency Contacted: 03/03/23 Representative spoke with at DME Agency: Loraine Leriche  Social Determinants of Health (SDOH) Interventions SDOH Screenings   Food Insecurity: No Food Insecurity (03/02/2023)  Housing: Patient Declined (03/02/2023)  Transportation Needs: No Transportation Needs (03/02/2023)  Utilities: Not At Risk (03/02/2023)  Alcohol Screen: Low Risk  (10/25/2022)  Depression (PHQ2-9): Low Risk  (01/25/2023)  Financial Resource Strain: Patient Declined (12/17/2022)  Physical Activity: Insufficiently Active (12/17/2022)  Social Connections: Unknown (12/17/2022)  Stress: No Stress Concern Present (12/17/2022)  Tobacco Use: Medium Risk (03/02/2023)   Readmission Risk Interventions     No data to display

## 2023-03-04 LAB — CBC
HCT: 40.3 % (ref 39.0–52.0)
Hemoglobin: 13.9 g/dL (ref 13.0–17.0)
MCH: 31 pg (ref 26.0–34.0)
MCHC: 34.5 g/dL (ref 30.0–36.0)
MCV: 90 fL (ref 80.0–100.0)
Platelets: 193 10*3/uL (ref 150–400)
RBC: 4.48 MIL/uL (ref 4.22–5.81)
RDW: 14.1 % (ref 11.5–15.5)
WBC: 17.2 10*3/uL — ABNORMAL HIGH (ref 4.0–10.5)
nRBC: 0 % (ref 0.0–0.2)

## 2023-03-04 LAB — GLUCOSE, CAPILLARY
Glucose-Capillary: 181 mg/dL — ABNORMAL HIGH (ref 70–99)
Glucose-Capillary: 189 mg/dL — ABNORMAL HIGH (ref 70–99)
Glucose-Capillary: 155 mg/dL — ABNORMAL HIGH (ref 70–99)
Glucose-Capillary: 154 mg/dL — ABNORMAL HIGH (ref 70–99)

## 2023-03-04 MED ORDER — HYDROMORPHONE HCL 2 MG PO TABS: 2.0000 mg | ORAL_TABLET | ORAL | 0 refills | Status: DC | PRN

## 2023-03-04 MED ORDER — GABAPENTIN 300 MG PO CAPS: 300.0000 mg | ORAL_CAPSULE | Freq: Three times a day (TID) | ORAL | 1 refills | Status: DC

## 2023-03-04 MED ORDER — PROMETHAZINE HCL 25 MG PO TABS
25.0000 mg | ORAL_TABLET | Freq: Four times a day (QID) | ORAL | Status: DC | PRN
  Administered 2023-03-04: 25 mg via ORAL
  Filled 2023-03-04: qty 1

## 2023-03-04 MED ORDER — SODIUM CHLORIDE 0.9 % IV BOLUS
500.0000 mL | Freq: Once | INTRAVENOUS | Status: AC
  Administered 2023-03-04: 500 mL via INTRAVENOUS

## 2023-03-04 NOTE — Progress Notes (Signed)
Physical Therapy Treatment Patient Details Name: Roger Guerra MRN: [id number] DOB: 21-May-1956 Today's Date: 03/04/2023   History of Present Illness 67 y.o. male admitted for L TKA 03/02/23. PMH: DM, depression/anxiety, DVT, spinal stenosis, OSA, obesity.    PT Comments    Pt ambulated 8' with RW, distance limited by significantly worsening dizziness and nausea. He was assisted to recliner where BP was 140/88, SpO2 95% on room air, HR 110. Dizziness and nausea are limiting progression of mobility. Pt puts forth good effort.    Recommendations for follow up therapy are one component of a multi-disciplinary discharge planning process, led by the attending physician.  Recommendations may be updated based on patient status, additional functional criteria and insurance authorization.  Follow Up Recommendations       Assistance Recommended at Discharge Frequent or constant Supervision/Assistance  Patient can return home with the following A little help with walking and/or transfers;A little help with bathing/dressing/bathroom;Assistance with cooking/housework;Assist for transportation;Help with stairs or ramp for entrance   Equipment Recommendations  Rolling walker (2 wheels)    Recommendations for Other Services       Precautions / Restrictions Precautions Precautions: Knee Precaution Booklet Issued: Yes (comment) Precaution Comments: reviewed no pillow under knee Restrictions Weight Bearing Restrictions: No     Mobility  Bed Mobility Overal bed mobility: Needs Assistance Bed Mobility: Supine to Sit     Supine to sit: Min assist     General bed mobility comments: assist to support LLE    Transfers Overall transfer level: Needs assistance Equipment used: Rolling walker (2 wheels) Transfers: Sit to/from Stand Sit to Stand: Min assist, From elevated surface           General transfer comment: VCs hand placement, min A to power up from elevated bed     Ambulation/Gait Ambulation/Gait assistance: Min guard Gait Distance (Feet): 8 Feet Assistive device: Rolling walker (2 wheels) Gait Pattern/deviations: Step-to pattern, Decreased step length - right, Decreased step length - left Gait velocity: decr     General Gait Details: VCs sequencing and positioning in RW, distance limited by dizziness/nausea, assisted to recliner where BP was 140/88, HR 110, SpO2 95% on room air   Stairs             Wheelchair Mobility    Modified Rankin (Stroke Patients Only)       Balance Overall balance assessment: Needs assistance Sitting-balance support: Feet supported, No upper extremity supported Sitting balance-Leahy Scale: Good     Standing balance support: Bilateral upper extremity supported, During functional activity, Reliant on assistive device for balance Standing balance-Leahy Scale: Poor                              Cognition Arousal/Alertness: Awake/alert Behavior During Therapy: WFL for tasks assessed/performed Overall Cognitive Status: Within Functional Limits for tasks assessed                                          Exercises Total Joint Exercises Ankle Circles/Pumps: AROM, Both, 10 reps, Supine Heel Slides: AAROM, Left, Supine, 10 reps    General Comments        Pertinent Vitals/Pain Pain Assessment Pain Score: 8  Pain Location: L knee Pain Descriptors / Indicators: Sore Pain Intervention(s): Limited activity within patient's tolerance, Monitored during session, Premedicated before session, Ice applied  Home Living                          Prior Function            PT Goals (current goals can now be found in the care plan section) Acute Rehab PT Goals Patient Stated Goal: decrease pain, hike PT Goal Formulation: With patient/family Time For Goal Achievement: 03/09/23 Potential to Achieve Goals: Fair Progress towards PT goals: Progressing toward goals     Frequency    7X/week      PT Plan Current plan remains appropriate    Co-evaluation              AM-PAC PT "6 Clicks" Mobility   Outcome Measure  Help needed turning from your back to your side while in a flat bed without using bedrails?: A Little Help needed moving from lying on your back to sitting on the side of a flat bed without using bedrails?: A Lot Help needed moving to and from a bed to a chair (including a wheelchair)?: A Little Help needed standing up from a chair using your arms (e.g., wheelchair or bedside chair)?: A Lot Help needed to walk in hospital room?: A Little Help needed climbing 3-5 steps with a railing? : Total 6 Click Score: 14    End of Session Equipment Utilized During Treatment: Gait belt Activity Tolerance: Patient limited by pain;Treatment limited secondary to medical complications (Comment) (dizziness/nausea) Patient left: with call bell/phone within reach;with family/visitor present;in chair;with chair alarm set Nurse Communication: Mobility status PT Visit Diagnosis: Pain;Difficulty in walking, not elsewhere classified (R26.2);Muscle weakness (generalized) (M62.81) Pain - Right/Left: Left Pain - part of body: Knee     Time: 1610-9604 PT Time Calculation (min) (ACUTE ONLY): 20 min  Charges:  $Gait Training: 8-22 mins                     Ralene Bathe Kistler PT 03/04/2023  Acute Rehabilitation Services  Office [phone]

## 2023-03-04 NOTE — Progress Notes (Signed)
Subjective: 2 Days Post-Op Procedure(s) (LRB): TOTAL KNEE ARTHROPLASTY (Left) Patient reports pain as mild.   Pain control much better  Objective: Vital signs in last 24 hours: Temp:  [97.7 F (36.5 C)-99.2 F (37.3 C)] 98.9 F (37.2 C) (06/07 0602) Pulse Rate:  [85-109] 109 (06/07 0602) Resp:  [18-20] 18 (06/07 0602) BP: (134-156)/(80-86) 134/84 (06/07 0602) SpO2:  [88 %-99 %] 94 % (06/07 0602)  Intake/Output from previous day: 06/06 0701 - 06/07 0700 In: 720 [P.O.:720] Out: 3050 [Urine:3050] Intake/Output this shift: No intake/output data recorded.  Recent Labs    03/03/23 0345 03/04/23 0401  HGB 14.7 13.9   Recent Labs    03/03/23 0345 03/04/23 0401  WBC 16.3* 17.2*  RBC 4.77 4.48  HCT 42.8 40.3  PLT 205 193   Recent Labs    03/03/23 0345  NA 130*  K 3.4*  CL 100  CO2 21*  BUN 9  CREATININE 0.79  GLUCOSE 186*  CALCIUM 8.3*   No results for input(s): "LABPT", "INR" in the last 72 hours.  Neurologically intact ABD soft Neurovascular intact Sensation intact distally Intact pulses distally Dorsiflexion/Plantar flexion intact Incision: dressing C/D/I and no drainage No cellulitis present Compartment soft No sign of DVT   Assessment/Plan: 2 Days Post-Op Procedure(s) (LRB): TOTAL KNEE ARTHROPLASTY (Left) Advance diet Up with therapy D/C IV fluids Plan for D/C later today after passes PT Discussed with Dr Tenna Delaine Doralee Albino 03/04/2023, 8:28 AM

## 2023-03-04 NOTE — Progress Notes (Signed)
Physical Therapy Treatment Patient Details Name: Roger Guerra MRN: [id number] DOB: 05-05-1956 Today's Date: 03/04/2023   History of Present Illness 67 y.o. male admitted for L TKA 03/02/23. PMH: DM, depression/anxiety, DVT, spinal stenosis, OSA, obesity.    PT Comments    Pt ambulated 6' with RW, distance limited by dizziness. Orthostatic vital: supine 153/88, HR 111; sitting 131/79, HR 131; standing 115/76, HR 138; Pt unable to tolerate standing for 3 minutes 2* dizziness and nausea. RN notified.    Recommendations for follow up therapy are one component of a multi-disciplinary discharge planning process, led by the attending physician.  Recommendations may be updated based on patient status, additional functional criteria and insurance authorization.  Follow Up Recommendations       Assistance Recommended at Discharge Frequent or constant Supervision/Assistance  Patient can return home with the following A little help with walking and/or transfers;A little help with bathing/dressing/bathroom;Assistance with cooking/housework;Assist for transportation;Help with stairs or ramp for entrance   Equipment Recommendations  Rolling walker (2 wheels)    Recommendations for Other Services       Precautions / Restrictions Precautions Precautions: Knee Precaution Booklet Issued: Yes (comment) Precaution Comments: reviewed no pillow under knee Restrictions Weight Bearing Restrictions: No     Mobility  Bed Mobility Overal bed mobility: Needs Assistance Bed Mobility: Supine to Sit     Supine to sit: Min assist     General bed mobility comments: assist to support LLE    Transfers Overall transfer level: Needs assistance Equipment used: Rolling walker (2 wheels) Transfers: Sit to/from Stand Sit to Stand: From elevated surface, Min guard           General transfer comment: VCs hand placement, min guard to power up from elevated bed    Ambulation/Gait Ambulation/Gait  assistance: Min guard Gait Distance (Feet): 6 Feet Assistive device: Rolling walker (2 wheels) Gait Pattern/deviations: Step-to pattern, Decreased step length - right, Decreased step length - left Gait velocity: decr     General Gait Details: VCs sequencing and positioning in RW, distance limited by dizziness/nausea, pt had significant drop is systolic BP with orthostatic vitals (supine 153/88, sit 131/79, stand 115/76), RN notified   Stairs             Wheelchair Mobility    Modified Rankin (Stroke Patients Only)       Balance Overall balance assessment: Needs assistance Sitting-balance support: Feet supported, No upper extremity supported Sitting balance-Leahy Scale: Good     Standing balance support: Bilateral upper extremity supported, During functional activity, Reliant on assistive device for balance Standing balance-Leahy Scale: Poor                              Cognition Arousal/Alertness: Awake/alert Behavior During Therapy: WFL for tasks assessed/performed Overall Cognitive Status: Within Functional Limits for tasks assessed                                          Exercises Total Joint Exercises Ankle Circles/Pumps: AROM, Both, 10 reps, Supine Quad Sets: AROM, Both, 5 reps, Supine Heel Slides: AAROM, Left, Supine, 10 reps    General Comments        Pertinent Vitals/Pain Pain Assessment Pain Score: 6  Pain Location: L knee Pain Descriptors / Indicators: Sore Pain Intervention(s): Limited activity within patient's tolerance, Monitored during session, Premedicated  before session, Ice applied    Home Living                          Prior Function            PT Goals (current goals can now be found in the care plan section) Acute Rehab PT Goals Patient Stated Goal: decrease pain, hike PT Goal Formulation: With patient/family Time For Goal Achievement: 03/09/23 Potential to Achieve Goals: Fair Progress  towards PT goals: Not progressing toward goals - comment (orthostatic hypotension limiting progress with mobility)    Frequency    7X/week      PT Plan Current plan remains appropriate    Co-evaluation              AM-PAC PT "6 Clicks" Mobility   Outcome Measure  Help needed turning from your back to your side while in a flat bed without using bedrails?: A Little Help needed moving from lying on your back to sitting on the side of a flat bed without using bedrails?: A Lot Help needed moving to and from a bed to a chair (including a wheelchair)?: A Little Help needed standing up from a chair using your arms (e.g., wheelchair or bedside chair)?: A Lot Help needed to walk in hospital room?: A Little Help needed climbing 3-5 steps with a railing? : Total 6 Click Score: 14    End of Session Equipment Utilized During Treatment: Gait belt Activity Tolerance: Patient limited by pain;Treatment limited secondary to medical complications (Comment) (dizziness/nausea) Patient left: with call bell/phone within reach;with family/visitor present;in chair;with chair alarm set;with nursing/sitter in room Nurse Communication: Mobility status;Other (comment) (nausea meds requested) PT Visit Diagnosis: Pain;Difficulty in walking, not elsewhere classified (R26.2);Muscle weakness (generalized) (M62.81) Pain - Right/Left: Left Pain - part of body: Knee     Time: 1610-9604 PT Time Calculation (min) (ACUTE ONLY): 26 min  Charges:  $Gait Training: 8-22 mins $Therapeutic Exercise: 8-22 mins                    Ralene Bathe Kistler PT 03/04/2023  Acute Rehabilitation Services  Office [phone]

## 2023-03-05 ENCOUNTER — Other Ambulatory Visit: Payer: Self-pay

## 2023-03-05 LAB — BASIC METABOLIC PANEL
Anion gap: 11 (ref 5–15)
BUN: 10 mg/dL (ref 8–23)
CO2: 25 mmol/L (ref 22–32)
Calcium: 8.7 mg/dL — ABNORMAL LOW (ref 8.9–10.3)
Chloride: 96 mmol/L — ABNORMAL LOW (ref 98–111)
Creatinine, Ser: 0.92 mg/dL (ref 0.61–1.24)
GFR, Estimated: >60 mL/min (ref >60)
Glucose, Bld: 181 mg/dL — ABNORMAL HIGH (ref 70–99)
Potassium: 3.2 mmol/L — ABNORMAL LOW (ref 3.5–5.1)
Sodium: 132 mmol/L — ABNORMAL LOW (ref 135–145)

## 2023-03-05 LAB — GLUCOSE, CAPILLARY
Glucose-Capillary: 167 mg/dL — ABNORMAL HIGH (ref 70–99)
Glucose-Capillary: 169 mg/dL — ABNORMAL HIGH (ref 70–99)
Glucose-Capillary: 182 mg/dL — ABNORMAL HIGH (ref 70–99)
Glucose-Capillary: 162 mg/dL — ABNORMAL HIGH (ref 70–99)
Glucose-Capillary: 158 mg/dL — ABNORMAL HIGH (ref 70–99)

## 2023-03-05 MED ORDER — DOXYCYCLINE HYCLATE 100 MG PO TABS
100.0000 mg | ORAL_TABLET | Freq: Two times a day (BID) | ORAL | Status: DC
  Administered 2023-03-05 – 2023-03-06 (×2): 100 mg via ORAL
  Filled 2023-03-05 (×2): qty 1

## 2023-03-05 MED ORDER — SODIUM CHLORIDE 0.9 % IV BOLUS
500.0000 mL | Freq: Once | INTRAVENOUS | Status: AC
  Administered 2023-03-05: 500 mL via INTRAVENOUS

## 2023-03-05 MED ORDER — BISACODYL 10 MG RE SUPP
10.0000 mg | Freq: Every day | RECTAL | Status: DC | PRN
  Administered 2023-03-05: 10 mg via RECTAL
  Filled 2023-03-05: qty 1

## 2023-03-05 NOTE — Progress Notes (Signed)
Messaged by RN that patient had increased pain in the operative knee with associated erythema medial to the incision. Aquacel dressing removed, incision is healing well with no signs of infection. Hx of DVT, denies calf pain.  Given erythema and pain, will get PO doxy on board but unlikely to be infection given proximity to surgery.  Arther Abbott, PA-C

## 2023-03-05 NOTE — Plan of Care (Signed)
  Problem: Tissue Perfusion: Goal: Adequacy of tissue perfusion will improve Outcome: Completed/Met   Problem: Education: Goal: Knowledge of the prescribed therapeutic regimen will improve Outcome: Adequate for Discharge Goal: Individualized Educational Video(s) Outcome: Completed/Met   Problem: Activity: Goal: Ability to avoid complications of mobility impairment will improve Outcome: Progressing Goal: Range of joint motion will improve Outcome: Adequate for Discharge   Problem: Clinical Measurements: Goal: Postoperative complications will be avoided or minimized Outcome: Progressing   Problem: Pain Management: Goal: Pain level will decrease with appropriate interventions Outcome: Adequate for Discharge   Problem: Skin Integrity: Goal: Will show signs of wound healing Outcome: Progressing   Problem: Education: Goal: Knowledge of General Education information will improve Description: Including pain rating scale, medication(s)/side effects and non-pharmacologic comfort measures Outcome: Progressing   Problem: Health Behavior/Discharge Planning: Goal: Ability to manage health-related needs will improve Outcome: Adequate for Discharge   Problem: Clinical Measurements: Goal: Ability to maintain clinical measurements within normal limits will improve Outcome: Progressing Goal: Will remain free from infection Outcome: Progressing Goal: Diagnostic test results will improve Outcome: Progressing Goal: Respiratory complications will improve Outcome: Progressing Goal: Cardiovascular complication will be avoided Outcome: Progressing   Problem: Activity: Goal: Risk for activity intolerance will decrease Outcome: Adequate for Discharge   Problem: Nutrition: Goal: Adequate nutrition will be maintained Outcome: Completed/Met   Problem: Coping: Goal: Level of anxiety will decrease Outcome: Progressing   Problem: Elimination: Goal: Will not experience complications  related to bowel motility Outcome: Progressing Goal: Will not experience complications related to urinary retention Outcome: Completed/Met   Problem: Pain Managment: Goal: General experience of comfort will improve Outcome: Adequate for Discharge   Problem: Safety: Goal: Ability to remain free from injury will improve Outcome: Progressing   Problem: Skin Integrity: Goal: Risk for impaired skin integrity will decrease Outcome: Completed/Met

## 2023-03-05 NOTE — Progress Notes (Signed)
Physical Therapy Treatment Patient Details Name: Roger Guerra MRN: [id number] DOB: 08-26-1956 Today's Date: 03/05/2023   History of Present Illness 67 y.o. male admitted for L TKA 03/02/23. PMH: DM, depression/anxiety, DVT, spinal stenosis, OSA, obesity.    PT Comments    Pt very cooperative and completed therex program with assist before transferring to EOB sitting  (requiring extensive use of bedrail).  Pt c/o dizziness with move to sitting and continued through to standing and transfer to recliner.  BP supine 157/88; sitting 124/81; and standing 104/77.  RN aware.   Recommendations for follow up therapy are one component of a multi-disciplinary discharge planning process, led by the attending physician.  Recommendations may be updated based on patient status, additional functional criteria and insurance authorization.  Follow Up Recommendations       Assistance Recommended at Discharge Frequent or constant Supervision/Assistance  Patient can return home with the following A little help with walking and/or transfers;A little help with bathing/dressing/bathroom;Assistance with cooking/housework;Assist for transportation;Help with stairs or ramp for entrance   Equipment Recommendations  Rolling walker (2 wheels)    Recommendations for Other Services       Precautions / Restrictions Precautions Precautions: Knee Precaution Comments: reviewed no pillow under knee Restrictions Weight Bearing Restrictions: No     Mobility  Bed Mobility Overal bed mobility: Needs Assistance Bed Mobility: Supine to Sit     Supine to sit: Min assist     General bed mobility comments: Extensive use of bedrail and assist to support LLE    Transfers Overall transfer level: Needs assistance Equipment used: Rolling walker (2 wheels) Transfers: Sit to/from Stand Sit to Stand: From elevated surface, Min guard           General transfer comment: VCs hand placement, min guard to power up from  elevated bed    Ambulation/Gait Ambulation/Gait assistance: Min guard Gait Distance (Feet): 4 Feet Assistive device: Rolling walker (2 wheels) Gait Pattern/deviations: Step-to pattern, Decreased step length - right, Decreased step length - left Gait velocity: decr     General Gait Details: cues for sequence, posture and position from RW; distance ltd by orthostatic hypotension   Stairs             Wheelchair Mobility    Modified Rankin (Stroke Patients Only)       Balance Overall balance assessment: Needs assistance Sitting-balance support: Feet supported, No upper extremity supported Sitting balance-Leahy Scale: Good     Standing balance support: Bilateral upper extremity supported, During functional activity, Reliant on assistive device for balance Standing balance-Leahy Scale: Poor                              Cognition Arousal/Alertness: Awake/alert Behavior During Therapy: WFL for tasks assessed/performed Overall Cognitive Status: Within Functional Limits for tasks assessed                                          Exercises Total Joint Exercises Ankle Circles/Pumps: AROM, Both, 10 reps, Supine Quad Sets: AROM, Both, Supine, 10 reps Heel Slides: AAROM, Left, Supine, 15 reps Straight Leg Raises: AAROM, Left, 10 reps, Supine    General Comments        Pertinent Vitals/Pain Pain Assessment Pain Assessment: 0-10 Pain Score: 5  Pain Location: L knee Pain Descriptors / Indicators: Sore Pain Intervention(s): Limited activity  within patient's tolerance, Monitored during session, Premedicated before session, Ice applied    Home Living                          Prior Function            PT Goals (current goals can now be found in the care plan section) Acute Rehab PT Goals Patient Stated Goal: Regain IND PT Goal Formulation: With patient/family Time For Goal Achievement: 03/09/23 Potential to Achieve Goals:  Fair Progress towards PT goals: Not progressing toward goals - comment (orthostatic)    Frequency    7X/week      PT Plan Current plan remains appropriate    Co-evaluation              AM-PAC PT "6 Clicks" Mobility   Outcome Measure  Help needed turning from your back to your side while in a flat bed without using bedrails?: A Little Help needed moving from lying on your back to sitting on the side of a flat bed without using bedrails?: A Lot Help needed moving to and from a bed to a chair (including a wheelchair)?: A Little Help needed standing up from a chair using your arms (e.g., wheelchair or bedside chair)?: A Lot Help needed to walk in hospital room?: A Little Help needed climbing 3-5 steps with a railing? : Total 6 Click Score: 14    End of Session Equipment Utilized During Treatment: Gait belt;Left knee immobilizer Activity Tolerance: Other (comment) (symptomatic and orthostatic) Patient left: with call bell/phone within reach;with family/visitor present;in chair;with chair alarm set;with nursing/sitter in room Nurse Communication: Mobility status;Other (comment) PT Visit Diagnosis: Pain;Difficulty in walking, not elsewhere classified (R26.2);Muscle weakness (generalized) (M62.81) Pain - Right/Left: Left Pain - part of body: Knee     Time: 2841-3244 PT Time Calculation (min) (ACUTE ONLY): 38 min  Charges:  $Therapeutic Exercise: 8-22 mins $Therapeutic Activity: 8-22 mins                     Mauro Kaufmann PT Acute Rehabilitation Services Pager [phone] Office [phone]    [facility] 03/05/2023, 12:35 PM

## 2023-03-05 NOTE — Progress Notes (Signed)
Physical Therapy Treatment Patient Details Name: Roger Guerra MRN: [id number] DOB: 04/19/1956 Today's Date: 03/05/2023   History of Present Illness 67 y.o. male admitted for L TKA 03/02/23. PMH: DM, depression/anxiety, DVT, spinal stenosis, OSA, obesity.    PT Comments    Pt continues very cooperative and with noted improvement in activity tolerance.  Pt requires increased time and multiple standing rest breaks to ambulate 13' with RW.  Pt denies dizziness but with BP in recliner 155/87; sitting 141/85; standing 128/103; and after ambulation 134/96.  RN aware.   Recommendations for follow up therapy are one component of a multi-disciplinary discharge planning process, led by the attending physician.  Recommendations may be updated based on patient status, additional functional criteria and insurance authorization.  Follow Up Recommendations       Assistance Recommended at Discharge Frequent or constant Supervision/Assistance  Patient can return home with the following A little help with walking and/or transfers;A little help with bathing/dressing/bathroom;Assistance with cooking/housework;Assist for transportation;Help with stairs or ramp for entrance   Equipment Recommendations  Rolling walker (2 wheels)    Recommendations for Other Services       Precautions / Restrictions Precautions Precautions: Knee Precaution Comments: reviewed no pillow under knee Restrictions Weight Bearing Restrictions: No     Mobility  Bed Mobility Overal bed mobility: Needs Assistance Bed Mobility: Sit to Supine     Supine to sit: Min assist Sit to supine: Min assist   General bed mobility comments: assist for L LE    Transfers Overall transfer level: Needs assistance Equipment used: Rolling walker (2 wheels) Transfers: Sit to/from Stand, Bed to chair/wheelchair/BSC Sit to Stand: From elevated surface, Min guard   Step pivot transfers: Min assist       General transfer comment: VCs  hand placement, min guard to power up; step pvt recliner to bedside    Ambulation/Gait Ambulation/Gait assistance: Min assist, Min guard Gait Distance (Feet): 58 Feet Assistive device: Rolling walker (2 wheels) Gait Pattern/deviations: Step-to pattern, Decreased step length - right, Decreased step length - left Gait velocity: decr     General Gait Details: cues for sequence, posture and position from RW; distance ltd by fatigue and abdominal discomfort   Stairs             Wheelchair Mobility    Modified Rankin (Stroke Patients Only)       Balance Overall balance assessment: Needs assistance Sitting-balance support: Feet supported, No upper extremity supported Sitting balance-Leahy Scale: Good     Standing balance support: Single extremity supported Standing balance-Leahy Scale: Poor                              Cognition Arousal/Alertness: Awake/alert Behavior During Therapy: WFL for tasks assessed/performed Overall Cognitive Status: Within Functional Limits for tasks assessed                                          Exercises Total Joint Exercises Ankle Circles/Pumps: AROM, Both, 10 reps, Supine Quad Sets: AROM, Both, Supine, 10 reps Heel Slides: AAROM, Left, Supine, 15 reps Straight Leg Raises: AAROM, Left, 10 reps, Supine    General Comments        Pertinent Vitals/Pain Pain Assessment Pain Assessment: 0-10 Pain Score: 8  Pain Location: L knee Pain Descriptors / Indicators: Sore, Aching Pain Intervention(s): Limited activity within  patient's tolerance, Monitored during session, Ice applied (pt declined pain meds prior to session)    Home Living                          Prior Function            PT Goals (current goals can now be found in the care plan section) Acute Rehab PT Goals Patient Stated Goal: Regain IND PT Goal Formulation: With patient/family Time For Goal Achievement: 03/09/23 Potential  to Achieve Goals: Fair Progress towards PT goals: Progressing toward goals    Frequency    7X/week      PT Plan Current plan remains appropriate    Co-evaluation              AM-PAC PT "6 Clicks" Mobility   Outcome Measure  Help needed turning from your back to your side while in a flat bed without using bedrails?: A Little Help needed moving from lying on your back to sitting on the side of a flat bed without using bedrails?: A Lot Help needed moving to and from a bed to a chair (including a wheelchair)?: A Little Help needed standing up from a chair using your arms (e.g., wheelchair or bedside chair)?: A Little Help needed to walk in hospital room?: A Little Help needed climbing 3-5 steps with a railing? : Total 6 Click Score: 15    End of Session Equipment Utilized During Treatment: Gait belt;Left knee immobilizer Activity Tolerance: Patient tolerated treatment well;Patient limited by fatigue Patient left: in bed;with call bell/phone within reach;with bed alarm set;with family/visitor present Nurse Communication: Mobility status;Other (comment) PT Visit Diagnosis: Pain;Difficulty in walking, not elsewhere classified (R26.2);Muscle weakness (generalized) (M62.81) Pain - Right/Left: Left Pain - part of body: Knee     Time: 6578-4696 PT Time Calculation (min) (ACUTE ONLY): 33 min  Charges:  $Gait Training: 8-22 mins $Therapeutic Exercise: 8-22 mins $Therapeutic Activity: 8-22 mins                     Mauro Kaufmann PT Acute Rehabilitation Services Pager [phone] Office [phone]    Marialena Wollen 03/05/2023, 3:59 PM

## 2023-03-05 NOTE — Progress Notes (Signed)
   Subjective: 3 Days Post-Op Procedure(s) (LRB): TOTAL KNEE ARTHROPLASTY (Left) Patient reports pain as moderate.   Plan is to go Home after hospital stay. Dizziness has resolved when he got up to bathroom  Objective: Vital signs in last 24 hours: Temp:  [98.2 F (36.8 C)-99.3 F (37.4 C)] 98.2 F (36.8 C) (06/08 0547) Pulse Rate:  [103-108] 103 (06/08 0547) Resp:  [16-18] 16 (06/08 0547) BP: (139-146)/(78-88) 146/81 (06/08 0547) SpO2:  [94 %-96 %] 96 % (06/08 0547) FiO2 (%):  [21 %] 21 % (06/07 2309)  Intake/Output from previous day:  Intake/Output Summary (Last 24 hours) at 03/05/2023 0716 Last data filed at 03/05/2023 0400 Gross per 24 hour  Intake 360 ml  Output 3150 ml  Net -2790 ml    Intake/Output this shift: No intake/output data recorded.  Labs: Recent Labs    03/03/23 0345 03/04/23 0401  HGB 14.7 13.9   Recent Labs    03/03/23 0345 03/04/23 0401  WBC 16.3* 17.2*  RBC 4.77 4.48  HCT 42.8 40.3  PLT 205 193   Recent Labs    03/03/23 0345 03/05/23 0400  NA 130* 132*  K 3.4* 3.2*  CL 100 96*  CO2 21* 25  BUN 9 10  CREATININE 0.79 0.92  GLUCOSE 186* 181*  CALCIUM 8.3* 8.7*   No results for input(s): "LABPT", "INR" in the last 72 hours.  EXAM General - Patient is Alert, Appropriate, and Oriented Extremity - Neurologically intact Neurovascular intact No cellulitis present Compartment soft Dressing/Incision - clean, dry, no drainage Motor Function - intact, moving foot and toes well on exam.   Past Medical History:  Diagnosis Date   Anxiety disorder    Arthritis    Atherosclerosis    Back pain    Cervical radiculopathy    Constipation    Coronary artery calcification seen on CT scan    Depression    Diabetes (HCC)    Erectile dysfunction    GERD (gastroesophageal reflux disease)    Heartburn    History of blood clots    History of DVT (deep vein thrombosis)    History of DVT of lower extremity    Right leg 2002    History of  pneumonia    Hyperlipidemia    Hypogonadism in male    Hypopituitarism (HCC)    Joint pain    Nerve root and plexus disorder    Obesity    Obstructive sleep apnea    OSA on CPAP    Osteoarthritis    Sciatica    Sleep apnea    SOBOE (shortness of breath on exertion)    Spinal stenosis    Spondylosis    Swelling of both lower extremities    Type 2 diabetes mellitus (HCC)    Vitamin D deficiency     Assessment/Plan: 3 Days Post-Op Procedure(s) (LRB): TOTAL KNEE ARTHROPLASTY (Left) Principal Problem:   Left knee DJD   Up with therapy Discharge home if he has done well with PT and if dizziness has resolved with PT  Weight-Bearing as tolerated to left leg  Ollen Gross 03/05/2023, 7:16 AM

## 2023-03-06 DIAGNOSIS — Z96652 Presence of left artificial knee joint: Secondary | ICD-10-CM | POA: Diagnosis not present

## 2023-03-06 DIAGNOSIS — M1712 Unilateral primary osteoarthritis, left knee: Secondary | ICD-10-CM | POA: Diagnosis not present

## 2023-03-06 LAB — GLUCOSE, CAPILLARY
Glucose-Capillary: 192 mg/dL — ABNORMAL HIGH (ref 70–99)
Glucose-Capillary: 180 mg/dL — ABNORMAL HIGH (ref 70–99)

## 2023-03-06 NOTE — Progress Notes (Signed)
Physical Therapy Treatment Patient Details Name: Roger Guerra MRN: [id number] DOB: March 27, 1956 Today's Date: 03/06/2023   History of Present Illness 67 y.o. male admitted for L TKA 03/02/23. PMH: DM, depression/anxiety, DVT, spinal stenosis, OSA, obesity.    PT Comments    Pt continues to progress with mobility including negotiating stairs this session with spouse assisting.  Pt with no c/o dizziness and with BP supine 140/79 and in standing after ambulating 161/83.  RN aware.  Pt pleased with progress and eager for dc home this date.  Recommendations for follow up therapy are one component of a multi-disciplinary discharge planning process, led by the attending physician.  Recommendations may be updated based on patient status, additional functional criteria and insurance authorization.  Follow Up Recommendations       Assistance Recommended at Discharge Frequent or constant Supervision/Assistance  Patient can return home with the following A little help with walking and/or transfers;A little help with bathing/dressing/bathroom;Assistance with cooking/housework;Assist for transportation;Help with stairs or ramp for entrance   Equipment Recommendations  Rolling walker (2 wheels)    Recommendations for Other Services       Precautions / Restrictions Precautions Precautions: Knee Restrictions Weight Bearing Restrictions: No     Mobility  Bed Mobility Overal bed mobility: Needs Assistance Bed Mobility: Supine to Sit, Sit to Supine     Supine to sit: Min assist Sit to supine: Min assist   General bed mobility comments: assist for L LE    Transfers Overall transfer level: Needs assistance Equipment used: Rolling walker (2 wheels) Transfers: Sit to/from Stand Sit to Stand: From elevated surface, Supervision           General transfer comment: cues for use of UEs to self assist    Ambulation/Gait Ambulation/Gait assistance: Min guard, Supervision Gait Distance  (Feet): 72 Feet Assistive device: Rolling walker (2 wheels) Gait Pattern/deviations: Step-to pattern, Decreased step length - right, Decreased step length - left Gait velocity: decr     General Gait Details: min cues for posture and position from RW   Stairs Stairs: Yes Stairs assistance: Min assist Stair Management: One rail Right, Step to pattern, Forwards, With crutches Number of Stairs: 5 General stair comments: cues for sequence and foot/crutch placement   Wheelchair Mobility    Modified Rankin (Stroke Patients Only)       Balance Overall balance assessment: Needs assistance Sitting-balance support: Feet supported, No upper extremity supported Sitting balance-Leahy Scale: Good     Standing balance support: Single extremity supported Standing balance-Leahy Scale: Poor                              Cognition Arousal/Alertness: Awake/alert Behavior During Therapy: WFL for tasks assessed/performed Overall Cognitive Status: Within Functional Limits for tasks assessed                                          Exercises Total Joint Exercises Ankle Circles/Pumps: AROM, Both, 10 reps, Supine Quad Sets: AROM, Both, Supine, 10 reps Heel Slides: AAROM, Left, Supine, 15 reps Straight Leg Raises: AAROM, Left, Supine, 20 reps Goniometric ROM: 0- 40 AAROM L knee    General Comments        Pertinent Vitals/Pain Pain Assessment Pain Assessment: 0-10 Pain Score: 4  Pain Location: L knee Pain Descriptors / Indicators: Sore, Aching Pain Intervention(s): Limited activity  within patient's tolerance, Monitored during session, Premedicated before session, Ice applied    Home Living                          Prior Function            PT Goals (current goals can now be found in the care plan section) Acute Rehab PT Goals Patient Stated Goal: Regain IND PT Goal Formulation: With patient/family Time For Goal Achievement:  03/09/23 Potential to Achieve Goals: Fair Progress towards PT goals: Progressing toward goals    Frequency    7X/week      PT Plan Current plan remains appropriate    Co-evaluation              AM-PAC PT "6 Clicks" Mobility   Outcome Measure  Help needed turning from your back to your side while in a flat bed without using bedrails?: A Little Help needed moving from lying on your back to sitting on the side of a flat bed without using bedrails?: A Little Help needed moving to and from a bed to a chair (including a wheelchair)?: A Little Help needed standing up from a chair using your arms (e.g., wheelchair or bedside chair)?: A Little Help needed to walk in hospital room?: A Little Help needed climbing 3-5 steps with a railing? : A Little 6 Click Score: 18    End of Session Equipment Utilized During Treatment: Gait belt;Left knee immobilizer Activity Tolerance: Patient tolerated treatment well Patient left: in bed;with call bell/phone within reach;with family/visitor present Nurse Communication: Mobility status;Other (comment) PT Visit Diagnosis: Pain;Difficulty in walking, not elsewhere classified (R26.2);Muscle weakness (generalized) (M62.81) Pain - Right/Left: Left Pain - part of body: Knee     Time: 1610-9604 PT Time Calculation (min) (ACUTE ONLY): 24 min  Charges:  $Gait Training: 8-22 mins $Therapeutic Exercise: 8-22 mins $Therapeutic Activity: 8-22 mins                     Mauro Kaufmann PT Acute Rehabilitation Services Pager [phone] Office [phone]    Roger Guerra 03/06/2023, 12:40 PM

## 2023-03-06 NOTE — Plan of Care (Signed)
  Problem: Education: Goal: Knowledge of the prescribed therapeutic regimen will improve Outcome: Adequate for Discharge   Problem: Activity: Goal: Ability to avoid complications of mobility impairment will improve Outcome: Progressing Goal: Range of joint motion will improve Outcome: Adequate for Discharge   Problem: Clinical Measurements: Goal: Postoperative complications will be avoided or minimized Outcome: Progressing   Problem: Pain Management: Goal: Pain level will decrease with appropriate interventions Outcome: Adequate for Discharge   Problem: Skin Integrity: Goal: Will show signs of wound healing Outcome: Progressing   Problem: Education: Goal: Knowledge of General Education information will improve Description: Including pain rating scale, medication(s)/side effects and non-pharmacologic comfort measures Outcome: Completed/Met   Problem: Health Behavior/Discharge Planning: Goal: Ability to manage health-related needs will improve Outcome: Adequate for Discharge   Problem: Clinical Measurements: Goal: Ability to maintain clinical measurements within normal limits will improve Outcome: Adequate for Discharge Goal: Will remain free from infection Outcome: Progressing Goal: Diagnostic test results will improve Outcome: Adequate for Discharge Goal: Respiratory complications will improve Outcome: Progressing Goal: Cardiovascular complication will be avoided Outcome: Progressing   Problem: Activity: Goal: Risk for activity intolerance will decrease Outcome: Adequate for Discharge   Problem: Coping: Goal: Level of anxiety will decrease Outcome: Adequate for Discharge   Problem: Elimination: Goal: Will not experience complications related to bowel motility Outcome: Adequate for Discharge   Problem: Pain Managment: Goal: General experience of comfort will improve Outcome: Adequate for Discharge   Problem: Safety: Goal: Ability to remain free from injury  will improve Outcome: Adequate for Discharge

## 2023-03-06 NOTE — Progress Notes (Signed)
   Subjective: 4 Days Post-Op Procedure(s) (LRB): TOTAL KNEE ARTHROPLASTY (Left) Patient reports pain as mild.   Plan is to go Home after hospital stay. Dizziness has resolved when he got up to bathroom  Objective: Vital signs in last 24 hours: Temp:  [98.5 F (36.9 C)-99.2 F (37.3 C)] 99.2 F (37.3 C) (06/09 0601) Pulse Rate:  [96-107] 96 (06/09 0601) Resp:  [18] 18 (06/09 0601) BP: (133-162)/(67-89) 135/67 (06/09 0601) SpO2:  [93 %-97 %] 93 % (06/09 0601) FiO2 (%):  [21 %] 21 % (06/08 2303)  Intake/Output from previous day:  Intake/Output Summary (Last 24 hours) at 03/06/2023 0909 Last data filed at 03/05/2023 1228 Gross per 24 hour  Intake 240 ml  Output 700 ml  Net -460 ml    Intake/Output this shift: No intake/output data recorded.  Labs: Recent Labs    03/04/23 0401  HGB 13.9   Recent Labs    03/04/23 0401  WBC 17.2*  RBC 4.48  HCT 40.3  PLT 193   Recent Labs    03/05/23 0400  NA 132*  K 3.2*  CL 96*  CO2 25  BUN 10  CREATININE 0.92  GLUCOSE 181*  CALCIUM 8.7*   No results for input(s): "LABPT", "INR" in the last 72 hours.  EXAM General - Patient is Alert, Appropriate, and Oriented Extremity - Neurologically intact Neurovascular intact No cellulitis present Compartment soft Dressing/Incision - clean, dry, no drainage Motor Function - intact, moving foot and toes well on exam.   Past Medical History:  Diagnosis Date   Anxiety disorder    Arthritis    Atherosclerosis    Back pain    Cervical radiculopathy    Constipation    Coronary artery calcification seen on CT scan    Depression    Diabetes (HCC)    Erectile dysfunction    GERD (gastroesophageal reflux disease)    Heartburn    History of blood clots    History of DVT (deep vein thrombosis)    History of DVT of lower extremity    Right leg 2002    History of pneumonia    Hyperlipidemia    Hypogonadism in male    Hypopituitarism (HCC)    Joint pain    Nerve root and plexus  disorder    Obesity    Obstructive sleep apnea    OSA on CPAP    Osteoarthritis    Sciatica    Sleep apnea    SOBOE (shortness of breath on exertion)    Spinal stenosis    Spondylosis    Swelling of both lower extremities    Type 2 diabetes mellitus (HCC)    Vitamin D deficiency     Assessment/Plan: 4 Days Post-Op Procedure(s) (LRB): TOTAL KNEE ARTHROPLASTY (Left) Principal Problem:   Left knee DJD   Up with therapy Discharge home if he has done well with PT and if dizziness has resolved with PT  Weight-Bearing as tolerated to left leg  Netta Cedars 03/06/2023, 9:09 AM

## 2023-03-06 NOTE — Progress Notes (Signed)
Physical Therapy Treatment Patient Details Name: Roger Guerra MRN: [id number] DOB: July 06, 1956 Today's Date: 03/06/2023   History of Present Illness 67 y.o. male admitted for L TKA 03/02/23. PMH: DM, depression/anxiety, DVT, spinal stenosis, OSA, obesity.    PT Comments    Pt motivated and progressing well with mobility.  Pt denies symptoms but BP supine 151/78, sit 139/88, stand 137/86 and after ambulating 120/77.  RN aware.  Recommendations for follow up therapy are one component of a multi-disciplinary discharge planning process, led by the attending physician.  Recommendations may be updated based on patient status, additional functional criteria and insurance authorization.  Follow Up Recommendations       Assistance Recommended at Discharge Frequent or constant Supervision/Assistance  Patient can return home with the following A little help with walking and/or transfers;A little help with bathing/dressing/bathroom;Assistance with cooking/housework;Assist for transportation;Help with stairs or ramp for entrance   Equipment Recommendations  Rolling walker (2 wheels)    Recommendations for Other Services       Precautions / Restrictions Precautions Precautions: Knee Restrictions Weight Bearing Restrictions: No     Mobility  Bed Mobility Overal bed mobility: Needs Assistance Bed Mobility: Supine to Sit     Supine to sit: Min assist     General bed mobility comments: assist for L LE    Transfers Overall transfer level: Needs assistance Equipment used: Rolling walker (2 wheels) Transfers: Sit to/from Stand, Bed to chair/wheelchair/BSC Sit to Stand: From elevated surface, Supervision                Ambulation/Gait Ambulation/Gait assistance: Min guard Gait Distance (Feet): 100 Feet Assistive device: Rolling walker (2 wheels) Gait Pattern/deviations: Step-to pattern, Decreased step length - right, Decreased step length - left Gait velocity: decr      General Gait Details: min cues for posture and position from [employer]             Wheelchair Mobility    Modified Rankin (Stroke Patients Only)       Balance Overall balance assessment: Needs assistance Sitting-balance support: Feet supported, No upper extremity supported Sitting balance-Leahy Scale: Good     Standing balance support: Single extremity supported Standing balance-Leahy Scale: Poor                              Cognition Arousal/Alertness: Awake/alert Behavior During Therapy: WFL for tasks assessed/performed Overall Cognitive Status: Within Functional Limits for tasks assessed                                          Exercises Total Joint Exercises Ankle Circles/Pumps: AROM, Both, 10 reps, Supine Quad Sets: AROM, Both, Supine, 10 reps Heel Slides: AAROM, Left, Supine, 15 reps Straight Leg Raises: AAROM, Left, Supine, 20 reps Goniometric ROM: 0- 40 AAROM L knee    General Comments        Pertinent Vitals/Pain Pain Assessment Pain Assessment: 0-10 Pain Score: 5  Pain Location: L knee Pain Descriptors / Indicators: Sore, Aching Pain Intervention(s): Limited activity within patient's tolerance, Monitored during session, Premedicated before session    Home Living                          Prior Function            PT  Goals (current goals can now be found in the care plan section) Acute Rehab PT Goals Patient Stated Goal: Regain IND PT Goal Formulation: With patient/family Time For Goal Achievement: 03/09/23 Potential to Achieve Goals: Fair Progress towards PT goals: Progressing toward goals    Frequency    7X/week      PT Plan Current plan remains appropriate    Co-evaluation              AM-PAC PT "6 Clicks" Mobility   Outcome Measure  Help needed turning from your back to your side while in a flat bed without using bedrails?: A Little Help needed moving from lying on your  back to sitting on the side of a flat bed without using bedrails?: A Little Help needed moving to and from a bed to a chair (including a wheelchair)?: A Little Help needed standing up from a chair using your arms (e.g., wheelchair or bedside chair)?: A Little Help needed to walk in hospital room?: A Little Help needed climbing 3-5 steps with a railing? : A Lot 6 Click Score: 17    End of Session Equipment Utilized During Treatment: Gait belt;Left knee immobilizer Activity Tolerance: Patient tolerated treatment well Patient left: Other (comment) (bathroom) Nurse Communication: Mobility status;Other (comment) PT Visit Diagnosis: Pain;Difficulty in walking, not elsewhere classified (R26.2);Muscle weakness (generalized) (M62.81) Pain - Right/Left: Left Pain - part of body: Knee     Time: 1610-9604 PT Time Calculation (min) (ACUTE ONLY): 36 min  Charges:  $Gait Training: 8-22 mins $Therapeutic Exercise: 8-22 mins                     Mauro Kaufmann PT Acute Rehabilitation Services Pager [phone] Office [phone]    Tida Saner 03/06/2023, 12:34 PM

## 2023-03-07 DIAGNOSIS — M25662 Stiffness of left knee, not elsewhere classified: Secondary | ICD-10-CM | POA: Diagnosis not present

## 2023-03-07 DIAGNOSIS — M25562 Pain in left knee: Secondary | ICD-10-CM | POA: Diagnosis not present

## 2023-03-08 ENCOUNTER — Telehealth: Payer: Self-pay

## 2023-03-08 NOTE — Transitions of Care (Post Inpatient/ED Visit) (Signed)
03/08/2023  Name: Roger Guerra MRN: [id number] DOB: October 31, 1955  Today's TOC FU Call Status: Today's TOC FU Call Status:: Successful TOC FU Call Competed TOC FU Call Complete Date: 03/08/23  Transition Care Management Follow-up Telephone Call Date of Discharge: 03/06/23 Discharge Facility: [facility]) Type of Discharge: Inpatient Admission Primary Inpatient Discharge Diagnosis:: Left Total Knee Arthroplasty How have you been since you were released from the hospital?: Better (Pain being managed with one Dilaudid, Neurontin and Robaxin) Any questions or concerns?: No  Items Reviewed: Did you receive and understand the discharge instructions provided?: Yes Medications obtained,verified, and reconciled?: Partial Review Completed Reason for Partial Mediation Review: Patient discussed pain medication- Neurontin, Dilaudud and Robaxin Any new allergies since your discharge?: No Dietary orders reviewed?: No Do you have support at home?: Yes People in Home: spouse Name of Support/Comfort Primary Source: Roger Guerra  Medications Reviewed Today: Medications Reviewed Today     Reviewed by Jodelle Gross, RN (Case Manager) on 03/08/23 at 1119  Med List Status: <None>   Medication Order Taking? Sig Documenting Provider Last Dose Status Informant  5-Hydroxytryptophan (5-HTP PO) [id number]  Take 150 mg by mouth daily. [provider]  Active Self  Kipp Laurence MEDICATION [id number]  Medication Name: Trimix  PGE 30 mcg Pap 30 mg Phent 1 mg  Patient taking differently: daily as needed (ED). Medication Name: Trimix  PGE 30 mcg Pap 30 mg Phent 1 mg   Malen Gauze, MD  Active Self  apixaban (ELIQUIS) 2.5 MG TABS tablet [id number]  Take 1 tablet (2.5 mg total) by mouth 2 (two) times daily. Jene Every, MD  Active   cetirizine (ZYRTEC) 10 MG tablet [id number]  Take 10 mg by mouth every evening.  [provider]  Active Self  Cholecalciferol (VITAMIN D3)  125 MCG (5000 UT) CAPS [id number]  Take 5,000 Units by mouth daily. [provider]  Active Self  clotrimazole-betamethasone (LOTRISONE) cream [id number]  Apply 1 Application topically 2 (two) times daily. Malen Gauze, MD  Active Self  COENZYME Q10 PO [id number]  Take 200 mg by mouth daily. [provider]  Active Self  docusate sodium (COLACE) 100 MG capsule [id number]  Take 1 capsule (100 mg total) by mouth 2 (two) times daily as needed for mild constipation. Jene Every, MD  Active   Dulaglutide (TRULICITY) 3 MG/0.5ML Namon Cirri [id number]  Inject 3 mg as directed once a week. Daphine Deutscher, Mary-Margaret, FNP  Active Self  Evolocumab (REPATHA SURECLICK) 140 MG/ML Ivory Broad [id number]  Inject 140 mg into the skin every 14 (fourteen) days. Chrystie Nose, MD  Active Self  ezetimibe (ZETIA) 10 MG tablet [id number]  Take 1 tablet (10 mg total) by mouth daily. Daphine Deutscher, Mary-Margaret, FNP  Active Self  fexofenadine (ALLEGRA) 180 MG tablet [id number]  Take 180 mg by mouth every morning.  [provider]  Active Self           Med Note Sherrie Mustache, Ronaldo Miyamoto A   Tue Dec 07, 2016  2:52 PM)    finasteride (PROSCAR) 5 MG tablet [id number]  Take 1 tablet (5 mg total) by mouth daily. Daphine Deutscher, Mary-Margaret, FNP  Active Self  gabapentin (NEURONTIN) 300 MG capsule [id number] Yes Take 1 capsule (300 mg total) by mouth 3 (three) times daily. Andrez Grime M, PA-C Taking Active   glipiZIDE (GLUCOTROL) 10 MG tablet [id number]  Take 1 tablet (10 mg total) by mouth 2 (two) times daily before a meal. Daphine Deutscher, Mary-Margaret, FNP  Active Self  glucose blood (  ONETOUCH ULTRA) test strip [id number]  check blood sugar once daily. DX:E11.69 Bennie Pierini, FNP  Active Self  hydrocortisone cream 1 % [id number]  Apply to affected area 2 times daily  Patient taking differently: Apply 1 Application topically as needed for itching.   Franky Macho, MD  Active Self  HYDROmorphone (DILAUDID) 2 MG tablet [id number] Yes Take  1-2 tablets (2-4 mg total) by mouth every 3 (three) hours as needed for severe pain (breakthrough pain). Dorothy Spark, PA-C Taking Active   Insulin Pen Needle (PEN NEEDLES) 31G X 6 MM MISC [id number]  1 each by Does not apply route daily. Daphine Deutscher Mary-Margaret, FNP  Active Self  MAGNESIUM MALATE PO [id number]  Take 1,000 mg by mouth daily. [provider]  Active Self  methocarbamol (ROBAXIN) 500 MG tablet [id number] Yes Take 1 tablet (500 mg total) by mouth every 8 (eight) hours as needed for muscle spasms. Jene Every, MD Taking Active   Multiple Vitamin (MULTIVITAMIN WITH MINERALS) TABS tablet [id number]  Take 1 tablet by mouth daily. [provider]  Active Self  omeprazole (PRILOSEC OTC) 20 MG tablet [id number]  Take 1 tablet (20 mg total) by mouth daily as needed (acid reflux). Daphine Deutscher, Mary-Margaret, FNP  Active Self  ondansetron (ZOFRAN) 4 MG tablet [id number]  Take 1 tablet (4 mg total) by mouth every 8 (eight) hours as needed for nausea or vomiting. Daphine Deutscher, Mary-Margaret, FNP  Active Self  polyethylene glycol (MIRALAX / GLYCOLAX) 17 g packet [id number] No Take 17 g by mouth daily.  Patient not taking: Reported on 03/08/2023   Jene Every, MD Not Taking Active   silodosin (RAPAFLO) 8 MG CAPS capsule [id number]  Take 1 capsule (8 mg total) by mouth in the morning and at bedtime. McKenzie, Mardene Celeste, MD  Active Self  sodium chloride (OCEAN) 0.65 % SOLN nasal spray [id number]  Place 1 spray into both nostrils 2 (two) times daily. [provider]  Active Self  SYRINGE-NEEDLE, DISP, 3 ML (LUER LOCK SAFETY SYRINGES) 22G X 1" 3 ML MISC [id number]  For testosterone  injections Bennie Pierini, FNP  Active Self  testosterone cypionate (DEPOTESTOSTERONE CYPIONATE) 200 MG/ML injection [id number]  Inject 100 mg into the muscle every 14 (fourteen) days. For IM use only [provider]  Active   zinc gluconate 50 MG tablet [id number]  Take 50 mg by mouth daily.  [provider]  Active Self  Med List Note Geri Seminole, CPhT 12/07/16 1500): [facility] [place] [phone]            Home Care and Equipment/Supplies: Were Home Health Services Ordered?: No Any new equipment or medical supplies ordered?: No Name of Medical supply agency?: [facility] [employer] Were you able to get the equipment/medical supplies?: Yes Do you have any questions related to the use of the equipment/supplies?: No  Functional Questionnaire: Do you need assistance with bathing/showering or dressing?: Yes Do you need assistance with meal preparation?: Yes Do you need assistance with eating?: No Do you have difficulty maintaining continence: No Do you need assistance with getting out of bed/getting out of a chair/moving?: No Do you have difficulty managing or taking your medications?: No  Follow up appointments reviewed: PCP Follow-up appointment confirmed?: NA Specialist Hospital Follow-up appointment confirmed?: Yes Date of Specialist follow-up appointment?: 03/16/23 Follow-Up Specialty Provider:: Dr. Shelle Iron- Orthopaedic surgery Do you need transportation to your follow-up appointment?: No Do you understand care options if your condition(s) worsen?: Yes-patient verbalized understanding  SDOH Interventions  Today    Flowsheet Row Most Recent Value  SDOH Interventions   Food Insecurity Interventions Intervention Not Indicated  Transportation Interventions Intervention Not Indicated      Jodelle Gross, RN, BSN, CCM Care Management Coordinator [place] Phone: [phone]/Fax: [phone]

## 2023-03-09 DIAGNOSIS — M25562 Pain in left knee: Secondary | ICD-10-CM | POA: Diagnosis not present

## 2023-03-09 DIAGNOSIS — M25662 Stiffness of left knee, not elsewhere classified: Secondary | ICD-10-CM | POA: Diagnosis not present

## 2023-03-09 NOTE — Discharge Summary (Signed)
Physician Discharge Summary   Patient ID: Roger Guerra MRN: [id number] DOB/AGE: 04/15/1956 67 y.o.  Admit date: 03/02/2023 Discharge date: 03/06/2023  Primary Diagnosis: left knee primary osteoarthritis  Admission Diagnoses:  Past Medical History:  Diagnosis Date   Anxiety disorder    Arthritis    Atherosclerosis    Back pain    Cervical radiculopathy    Constipation    Coronary artery calcification seen on CT scan    Depression    Diabetes (HCC)    Erectile dysfunction    GERD (gastroesophageal reflux disease)    Heartburn    History of blood clots    History of DVT (deep vein thrombosis)    History of DVT of lower extremity    Right leg 2002    History of pneumonia    Hyperlipidemia    Hypogonadism in male    Hypopituitarism (HCC)    Joint pain    Nerve root and plexus disorder    Obesity    Obstructive sleep apnea    OSA on CPAP    Osteoarthritis    Sciatica    Sleep apnea    SOBOE (shortness of breath on exertion)    Spinal stenosis    Spondylosis    Swelling of both lower extremities    Type 2 diabetes mellitus (HCC)    Vitamin D deficiency    Discharge Diagnoses:   Principal Problem:   Left knee DJD  Estimated body mass index is 36.5 kg/m as calculated from the following:   Height as of this encounter: 5\' 11"  (1.803 m).   Weight as of this encounter: 118.7 kg.  Procedure:  Procedure(s) (LRB): TOTAL KNEE ARTHROPLASTY (Left)   Consults: None  HPI: see H&P Laboratory Data: Admission on 03/02/2023, Discharged on 03/06/2023  Component Date Value Ref Range Status   Glucose-Capillary 03/02/2023 171 (H)  70 - 99 mg/dL Final   Glucose reference range applies only to samples taken after fasting for at least 8 hours.   Comment 1 03/02/2023 Notify RN   Final   Comment 2 03/02/2023 Document in Chart   Final   Glucose-Capillary 03/02/2023 122 (H)  70 - 99 mg/dL Final   Glucose reference range applies only to samples taken after fasting for at least 8  hours.   Glucose-Capillary 03/02/2023 151 (H)  70 - 99 mg/dL Final   Glucose reference range applies only to samples taken after fasting for at least 8 hours.   WBC 03/03/2023 16.3 (H)  4.0 - 10.5 K/uL Final   RBC 03/03/2023 4.77  4.22 - 5.81 MIL/uL Final   Hemoglobin 03/03/2023 14.7  13.0 - 17.0 g/dL Final   HCT 29/56/2130 42.8  39.0 - 52.0 % Final   MCV 03/03/2023 89.7  80.0 - 100.0 fL Final   MCH 03/03/2023 30.8  26.0 - 34.0 pg Final   MCHC 03/03/2023 34.3  30.0 - 36.0 g/dL Final   RDW 86/57/8469 14.0  11.5 - 15.5 % Final   Platelets 03/03/2023 205  150 - 400 K/uL Final   nRBC 03/03/2023 0.0  0.0 - 0.2 % Final   Performed at [facility], [address], [place], [place] [address]   Sodium 03/03/2023 130 (L)  135 - 145 mmol/L Final   Potassium 03/03/2023 3.4 (L)  3.5 - 5.1 mmol/L Final   Chloride 03/03/2023 100  98 - 111 mmol/L Final   CO2 03/03/2023 21 (L)  22 - 32 mmol/L Final   Glucose, Bld 03/03/2023  186 (H)  70 - 99 mg/dL Final   Glucose reference range applies only to samples taken after fasting for at least 8 hours.   BUN 03/03/2023 9  8 - 23 mg/dL Final   Creatinine, Ser 03/03/2023 0.79  0.61 - 1.24 mg/dL Final   Calcium 16/06/9603 8.3 (L)  8.9 - 10.3 mg/dL Final   GFR, Estimated 03/03/2023 >60  >60 mL/min Final   Comment: (NOTE) Calculated using the CKD-EPI Creatinine Equation (2021)    Anion gap 03/03/2023 9  5 - 15 Final   Performed at [facility], [address], [place], [place] [address]   Glucose-Capillary 03/02/2023 144 (H)  70 - 99 mg/dL Final   Glucose reference range applies only to samples taken after fasting for at least 8 hours.   Glucose-Capillary 03/03/2023 154 (H)  70 - 99 mg/dL Final   Glucose reference range applies only to samples taken after fasting for at least 8 hours.   Glucose-Capillary 03/03/2023 193 (H)  70 - 99 mg/dL Final   Glucose reference range applies only to samples taken after fasting for at least 8  hours.   Glucose-Capillary 03/03/2023 209 (H)  70 - 99 mg/dL Final   Glucose reference range applies only to samples taken after fasting for at least 8 hours.   WBC 03/04/2023 17.2 (H)  4.0 - 10.5 K/uL Final   RBC 03/04/2023 4.48  4.22 - 5.81 MIL/uL Final   Hemoglobin 03/04/2023 13.9  13.0 - 17.0 g/dL Final   HCT 11/91/4782 40.3  39.0 - 52.0 % Final   MCV 03/04/2023 90.0  80.0 - 100.0 fL Final   MCH 03/04/2023 31.0  26.0 - 34.0 pg Final   MCHC 03/04/2023 34.5  30.0 - 36.0 g/dL Final   RDW 95/62/1308 14.1  11.5 - 15.5 % Final   Platelets 03/04/2023 193  150 - 400 K/uL Final   nRBC 03/04/2023 0.0  0.0 - 0.2 % Final   Performed at [facility], [address] [place], [place] [address]   Glucose-Capillary 03/03/2023 181 (H)  70 - 99 mg/dL Final   Glucose reference range applies only to samples taken after fasting for at least 8 hours.   Glucose-Capillary 03/04/2023 181 (H)  70 - 99 mg/dL Final   Glucose reference range applies only to samples taken after fasting for at least 8 hours.   Glucose-Capillary 03/04/2023 189 (H)  70 - 99 mg/dL Final   Glucose reference range applies only to samples taken after fasting for at least 8 hours.   Glucose-Capillary 03/04/2023 155 (H)  70 - 99 mg/dL Final   Glucose reference range applies only to samples taken after fasting for at least 8 hours.   Sodium 03/05/2023 132 (L)  135 - 145 mmol/L Final   Potassium 03/05/2023 3.2 (L)  3.5 - 5.1 mmol/L Final   Chloride 03/05/2023 96 (L)  98 - 111 mmol/L Final   CO2 03/05/2023 25  22 - 32 mmol/L Final   Glucose, Bld 03/05/2023 181 (H)  70 - 99 mg/dL Final   Glucose reference range applies only to samples taken after fasting for at least 8 hours.   BUN 03/05/2023 10  8 - 23 mg/dL Final   Creatinine, Ser 03/05/2023 0.92  0.61 - 1.24 mg/dL Final   Calcium 69/62/9528 8.7 (L)  8.9 - 10.3 mg/dL Final   GFR, Estimated 03/05/2023 >60  >60 mL/min Final   Comment: (NOTE) Calculated using the CKD-EPI  Creatinine Equation (  2021)    Anion gap 03/05/2023 11  5 - 15 Final   Performed at [facility], [address], [place], [place] [address]   Glucose-Capillary 03/04/2023 154 (H)  70 - 99 mg/dL Final   Glucose reference range applies only to samples taken after fasting for at least 8 hours.   Glucose-Capillary 03/05/2023 167 (H)  70 - 99 mg/dL Final   Glucose reference range applies only to samples taken after fasting for at least 8 hours.   Glucose-Capillary 03/05/2023 169 (H)  70 - 99 mg/dL Final   Glucose reference range applies only to samples taken after fasting for at least 8 hours.   Glucose-Capillary 03/05/2023 182 (H)  70 - 99 mg/dL Final   Glucose reference range applies only to samples taken after fasting for at least 8 hours.   Glucose-Capillary 03/05/2023 162 (H)  70 - 99 mg/dL Final   Glucose reference range applies only to samples taken after fasting for at least 8 hours.   Glucose-Capillary 03/05/2023 158 (H)  70 - 99 mg/dL Final   Glucose reference range applies only to samples taken after fasting for at least 8 hours.   Glucose-Capillary 03/06/2023 192 (H)  70 - 99 mg/dL Final   Glucose reference range applies only to samples taken after fasting for at least 8 hours.   Glucose-Capillary 03/06/2023 180 (H)  70 - 99 mg/dL Final   Glucose reference range applies only to samples taken after fasting for at least 8 hours.  Scanned Document on 02/25/2023  Component Date Value Ref Range Status   HM Diabetic Eye Exam 12/16/2022 No Retinopathy  No Retinopathy Final   Delora Fuel, [facility] Outpatient Visit on 02/23/2023  Component Date Value Ref Range Status   MRSA, PCR 02/23/2023 NEGATIVE  NEGATIVE Final   Staphylococcus aureus 02/23/2023 NEGATIVE  NEGATIVE Final   Comment: (NOTE) The Xpert SA Assay (FDA approved for NASAL specimens in patients 96 years of age and older), is one component of a comprehensive surveillance program. It is not intended to  diagnose infection nor to guide or monitor treatment. Performed at [facility], [address] [place], [place] [address]    Hgb A1c MFr Bld 02/23/2023 7.5 (H)  4.8 - 5.6 % Final   Comment: (NOTE)         Prediabetes: 5.7 - 6.4         Diabetes: >6.4         Glycemic control for adults with diabetes: <7.0    Mean Plasma Glucose 02/23/2023 169  mg/dL Final   Comment: (NOTE) Performed At: [facility] [address] [place], [place] [id number] Jolene Schimke MD LK:[id number]    Sodium 02/23/2023 139  135 - 145 mmol/L Final   Potassium 02/23/2023 4.2  3.5 - 5.1 mmol/L Final   Chloride 02/23/2023 105  98 - 111 mmol/L Final   CO2 02/23/2023 23  22 - 32 mmol/L Final   Glucose, Bld 02/23/2023 121 (H)  70 - 99 mg/dL Final   Glucose reference range applies only to samples taken after fasting for at least 8 hours.   BUN 02/23/2023 12  8 - 23 mg/dL Final   Creatinine, Ser 02/23/2023 0.91  0.61 - 1.24 mg/dL Final   Calcium 66/44/0347 9.3  8.9 - 10.3 mg/dL Final   GFR, Estimated 02/23/2023 >60  >60 mL/min Final   Comment: (NOTE) Calculated using the CKD-EPI Creatinine Equation (2021)    Anion gap 02/23/2023  11  5 - 15 Final   Performed at [facility] [address] [place], [place] [address]   WBC 02/23/2023 8.4  4.0 - 10.5 K/uL Final   RBC 02/23/2023 5.64  4.22 - 5.81 MIL/uL Final   Hemoglobin 02/23/2023 17.0  13.0 - 17.0 g/dL Final   HCT 47/82/9562 51.1  39.0 - 52.0 % Final   MCV 02/23/2023 90.6  80.0 - 100.0 fL Final   MCH 02/23/2023 30.1  26.0 - 34.0 pg Final   MCHC 02/23/2023 33.3  30.0 - 36.0 g/dL Final   RDW 13/04/6577 14.5  11.5 - 15.5 % Final   Platelets 02/23/2023 272  150 - 400 K/uL Final   nRBC 02/23/2023 0.0  0.0 - 0.2 % Final   Performed at [facility], [address] [place], [place] [address]   Glucose-Capillary 02/23/2023 130 (H)  70 - 99 mg/dL Final   Glucose reference range applies only to  samples taken after fasting for at least 8 hours.  Office Visit on 01/25/2023  Component Date Value Ref Range Status   HB A1C (BAYER DCA - WAIVED) 01/25/2023 8.0 (H)  4.8 - 5.6 % Final   Comment:          Prediabetes: 5.7 - 6.4          Diabetes: >6.4          Glycemic control for adults with diabetes: <7.0      X-Rays:DG Knee 1-2 Views Left  Result Date: 03/02/2023 CLINICAL DATA:  Status post left knee replacement. EXAM: LEFT KNEE - 1-2 VIEW COMPARISON:  None Available. FINDINGS: Left femoral and tibial components are well situated. Expected postoperative changes are noted in the soft tissues anteriorly. IMPRESSION: Status post left total knee arthroplasty. Electronically Signed   By: Lupita Raider M.D.   On: 03/02/2023 11:43    EKG: Orders placed or performed in visit on 08/17/22   EKG 12-Lead     Hospital Course: Roger Guerra is a 67 y.o. who was admitted to [facility]. They were brought to the operating room on 03/02/2023 and underwent Procedure(s): TOTAL KNEE ARTHROPLASTY.  Patient tolerated the procedure well and was later transferred to the recovery room and then to the orthopaedic floor for postoperative care.  They were given PO and IV analgesics for pain control following their surgery.  They were given 24 hours of postoperative antibiotics of  Anti-infectives (From admission, onward)    Start     Dose/Rate Route Frequency Ordered Stop   03/05/23 1815  doxycycline (VIBRA-TABS) tablet 100 mg  Status:  Discontinued        100 mg Oral Every 12 hours 03/05/23 1719 03/06/23 1823   03/02/23 1430  ceFAZolin (ANCEF) IVPB 2g/100 mL premix        2 g 200 mL/hr over 30 Minutes Intravenous Every 6 hours 03/02/23 1319 03/02/23 2054   03/02/23 0645  ceFAZolin (ANCEF) IVPB 2g/100 mL premix        2 g 200 mL/hr over 30 Minutes Intravenous On call to O.R. 03/02/23 9528 03/02/23 0858   03/02/23 0644  ceFAZolin (ANCEF) 2-4 GM/100ML-% IVPB       Note to Pharmacy: Jolyn Nap B:  cabinet override      03/02/23 0644 03/02/23 0923      and started on DVT prophylaxis in the form of TED hose and Eliquis and SCDs .   PT and OT were ordered for total joint protocol.  Discharge planning consulted to help with postop disposition and equipment needs.  Patient had a difficult night on the evening of surgery.  They started to get up OOB with therapy on day one. Continued to work with therapy into day two.  By day four, the patient had progressed with therapy and meeting their goals.  Incision was healing well.  Patient was seen in rounds and was ready to go home.   Diet: Diabetic diet Activity:WBAT Follow-up:in 10-14 days Disposition - Home Discharged Condition: good   Discharge Instructions     Call MD / Call 911   Complete by: As directed    If you experience chest pain or shortness of breath, CALL 911 and be transported to the hospital emergency room.  If you develope a fever above 101 F, pus (white drainage) or increased drainage or redness at the wound, or calf pain, call your surgeon's office.   Constipation Prevention   Complete by: As directed    Drink plenty of fluids.  Prune juice may be helpful.  You may use a stool softener, such as Colace (over the counter) 100 mg twice a day.  Use MiraLax (over the counter) for constipation as needed.   Diet - low sodium heart healthy   Complete by: As directed    Increase activity slowly as tolerated   Complete by: As directed    Post-operative opioid taper instructions:   Complete by: As directed    POST-OPERATIVE OPIOID TAPER INSTRUCTIONS: It is important to wean off of your opioid medication as soon as possible. If you do not need pain medication after your surgery it is ok to stop day one. Opioids include: Codeine, Hydrocodone(Norco, Vicodin), Oxycodone(Percocet, oxycontin) and hydromorphone amongst others.  Long term and even short term use of opiods can cause: Increased pain  response Dependence Constipation Depression Respiratory depression And more.  Withdrawal symptoms can include Flu like symptoms Nausea, vomiting And more Techniques to manage these symptoms Hydrate well Eat regular healthy meals Stay active Use relaxation techniques(deep breathing, meditating, yoga) Do Not substitute Alcohol to help with tapering If you have been on opioids for less than two weeks and do not have pain than it is ok to stop all together.  Plan to wean off of opioids This plan should start within one week post op of your joint replacement. Maintain the same interval or time between taking each dose and first decrease the dose.  Cut the total daily intake of opioids by one tablet each day Next start to increase the time between doses. The last dose that should be eliminated is the evening dose.         Allergies as of 03/06/2023       Reactions   Invokana [canagliflozin] Other (See Comments)   Yeast infection   Statins Other (See Comments)   Severe muscle cramps        Medication List     STOP taking these medications    aspirin 81 MG tablet   carisoprodol 350 MG tablet Commonly known as: SOMA   Fish Oil 1000 MG Caps   ibuprofen 800 MG tablet Commonly known as: IBU   Lantus SoloStar 100 UNIT/ML Solostar Pen Generic drug: insulin glargine   naproxen sodium 220 MG tablet Commonly known as: ALEVE   oxyCODONE-acetaminophen 5-325 MG tablet Commonly known as: PERCOCET/ROXICET   vitamin E 180 MG (400 UNITS) capsule       TAKE these medications    5-HTP PO Take 150  mg by mouth daily.   AMBULATORY NON FORMULARY MEDICATION Medication Name: Trimix  PGE 30 mcg Pap 30 mg Phent 1 mg What changed:  when to take this reasons to take this   apixaban 2.5 MG Tabs tablet Commonly known as: ELIQUIS Take 1 tablet (2.5 mg total) by mouth 2 (two) times daily.   cetirizine 10 MG tablet Commonly known as: ZYRTEC Take 10 mg by mouth every  evening.   clotrimazole-betamethasone cream Commonly known as: Lotrisone Apply 1 Application topically 2 (two) times daily.   COENZYME Q10 PO Take 200 mg by mouth daily.   docusate sodium 100 MG capsule Commonly known as: Colace Take 1 capsule (100 mg total) by mouth 2 (two) times daily as needed for mild constipation.   ezetimibe 10 MG tablet Commonly known as: ZETIA Take 1 tablet (10 mg total) by mouth daily.   fexofenadine 180 MG tablet Commonly known as: ALLEGRA Take 180 mg by mouth every morning.   finasteride 5 MG tablet Commonly known as: PROSCAR Take 1 tablet (5 mg total) by mouth daily.   gabapentin 300 MG capsule Commonly known as: NEURONTIN Take 1 capsule (300 mg total) by mouth 3 (three) times daily.   glipiZIDE 10 MG tablet Commonly known as: GLUCOTROL Take 1 tablet (10 mg total) by mouth 2 (two) times daily before a meal.   hydrocortisone cream 1 % Apply to affected area 2 times daily What changed:  how much to take how to take this when to take this reasons to take this additional instructions   HYDROmorphone 2 MG tablet Commonly known as: DILAUDID Take 1-2 tablets (2-4 mg total) by mouth every 3 (three) hours as needed for severe pain (breakthrough pain).   Luer Lock Safety Syringes 22G X 1" 3 ML Misc Generic drug: SYRINGE-NEEDLE (DISP) 3 ML For testosterone  injections   MAGNESIUM MALATE PO Take 1,000 mg by mouth daily.   methocarbamol 500 MG tablet Commonly known as: ROBAXIN Take 1 tablet (500 mg total) by mouth every 8 (eight) hours as needed for muscle spasms.   multivitamin with minerals Tabs tablet Take 1 tablet by mouth daily.   omeprazole 20 MG tablet Commonly known as: PRILOSEC OTC Take 1 tablet (20 mg total) by mouth daily as needed (acid reflux).   ondansetron 4 MG tablet Commonly known as: Zofran Take 1 tablet (4 mg total) by mouth every 8 (eight) hours as needed for nausea or vomiting.   OneTouch Ultra test  strip Generic drug: glucose blood check blood sugar once daily. DX:E11.69   Pen Needles 31G X 6 MM Misc 1 each by Does not apply route daily.   polyethylene glycol 17 g packet Commonly known as: MIRALAX / GLYCOLAX Take 17 g by mouth daily.   Repatha SureClick 140 MG/ML Soaj Generic drug: Evolocumab Inject 140 mg into the skin every 14 (fourteen) days.   silodosin 8 MG Caps capsule Commonly known as: RAPAFLO Take 1 capsule (8 mg total) by mouth in the morning and at bedtime.   sodium chloride 0.65 % Soln nasal spray Commonly known as: OCEAN Place 1 spray into both nostrils 2 (two) times daily.   testosterone cypionate 200 MG/ML injection Commonly known as: DEPOTESTOSTERONE CYPIONATE Inject 100 mg into the muscle every 14 (fourteen) days. For IM use only   Trulicity 3 MG/0.5ML Sopn Generic drug: Dulaglutide Inject 3 mg as directed once a week.   Vitamin D3 125 MCG (5000 UT) Caps Take 5,000 Units by mouth daily.  zinc gluconate 50 MG tablet Take 50 mg by mouth daily.        Follow-up Information     Jene Every, MD. Go on 03/16/2023.   Specialty: Orthopedic Surgery Why: You are scheduled for first post op on Wednesday June 19 at 2:15pm. Contact information: [address] [place] [address] [phone]                 Signed: Andrez Grime, PA-C Orthopaedic Surgery 03/09/2023, 7:59 AM

## 2023-03-11 DIAGNOSIS — M25662 Stiffness of left knee, not elsewhere classified: Secondary | ICD-10-CM | POA: Diagnosis not present

## 2023-03-11 DIAGNOSIS — M25562 Pain in left knee: Secondary | ICD-10-CM | POA: Diagnosis not present

## 2023-03-14 DIAGNOSIS — M25562 Pain in left knee: Secondary | ICD-10-CM | POA: Diagnosis not present

## 2023-03-14 DIAGNOSIS — M25662 Stiffness of left knee, not elsewhere classified: Secondary | ICD-10-CM | POA: Diagnosis not present

## 2023-03-15 DIAGNOSIS — L821 Other seborrheic keratosis: Secondary | ICD-10-CM | POA: Diagnosis not present

## 2023-03-15 DIAGNOSIS — Z1283 Encounter for screening for malignant neoplasm of skin: Secondary | ICD-10-CM | POA: Diagnosis not present

## 2023-03-15 DIAGNOSIS — D485 Neoplasm of uncertain behavior of skin: Secondary | ICD-10-CM | POA: Diagnosis not present

## 2023-03-16 DIAGNOSIS — Z5189 Encounter for other specified aftercare: Secondary | ICD-10-CM | POA: Diagnosis not present

## 2023-03-16 DIAGNOSIS — M25562 Pain in left knee: Secondary | ICD-10-CM | POA: Diagnosis not present

## 2023-03-16 DIAGNOSIS — M25662 Stiffness of left knee, not elsewhere classified: Secondary | ICD-10-CM | POA: Diagnosis not present

## 2023-03-18 ENCOUNTER — Ambulatory Visit: Payer: Medicare HMO | Admitting: Urology

## 2023-03-18 DIAGNOSIS — M25562 Pain in left knee: Secondary | ICD-10-CM | POA: Diagnosis not present

## 2023-03-18 DIAGNOSIS — M25662 Stiffness of left knee, not elsewhere classified: Secondary | ICD-10-CM | POA: Diagnosis not present

## 2023-03-21 DIAGNOSIS — M25662 Stiffness of left knee, not elsewhere classified: Secondary | ICD-10-CM | POA: Diagnosis not present

## 2023-03-21 DIAGNOSIS — M25562 Pain in left knee: Secondary | ICD-10-CM | POA: Diagnosis not present

## 2023-03-23 DIAGNOSIS — M25562 Pain in left knee: Secondary | ICD-10-CM | POA: Diagnosis not present

## 2023-03-23 DIAGNOSIS — M25662 Stiffness of left knee, not elsewhere classified: Secondary | ICD-10-CM | POA: Diagnosis not present

## 2023-03-25 DIAGNOSIS — M25662 Stiffness of left knee, not elsewhere classified: Secondary | ICD-10-CM | POA: Diagnosis not present

## 2023-03-25 DIAGNOSIS — M25562 Pain in left knee: Secondary | ICD-10-CM | POA: Diagnosis not present

## 2023-03-26 DIAGNOSIS — G4733 Obstructive sleep apnea (adult) (pediatric): Secondary | ICD-10-CM | POA: Diagnosis not present

## 2023-03-28 DIAGNOSIS — M25662 Stiffness of left knee, not elsewhere classified: Secondary | ICD-10-CM | POA: Diagnosis not present

## 2023-03-28 DIAGNOSIS — M25562 Pain in left knee: Secondary | ICD-10-CM | POA: Diagnosis not present

## 2023-03-30 DIAGNOSIS — M25662 Stiffness of left knee, not elsewhere classified: Secondary | ICD-10-CM | POA: Diagnosis not present

## 2023-03-30 DIAGNOSIS — M25562 Pain in left knee: Secondary | ICD-10-CM | POA: Diagnosis not present

## 2023-04-01 DIAGNOSIS — M25562 Pain in left knee: Secondary | ICD-10-CM | POA: Diagnosis not present

## 2023-04-01 DIAGNOSIS — M25662 Stiffness of left knee, not elsewhere classified: Secondary | ICD-10-CM | POA: Diagnosis not present

## 2023-04-04 DIAGNOSIS — M25562 Pain in left knee: Secondary | ICD-10-CM | POA: Diagnosis not present

## 2023-04-04 DIAGNOSIS — M25662 Stiffness of left knee, not elsewhere classified: Secondary | ICD-10-CM | POA: Diagnosis not present

## 2023-04-06 DIAGNOSIS — M25562 Pain in left knee: Secondary | ICD-10-CM | POA: Diagnosis not present

## 2023-04-06 DIAGNOSIS — M25662 Stiffness of left knee, not elsewhere classified: Secondary | ICD-10-CM | POA: Diagnosis not present

## 2023-04-07 DIAGNOSIS — M5451 Vertebrogenic low back pain: Secondary | ICD-10-CM | POA: Diagnosis not present

## 2023-04-08 ENCOUNTER — Encounter (HOSPITAL_COMMUNITY): Payer: Self-pay | Admitting: Specialist

## 2023-04-08 DIAGNOSIS — M25562 Pain in left knee: Secondary | ICD-10-CM | POA: Diagnosis not present

## 2023-04-08 DIAGNOSIS — M25662 Stiffness of left knee, not elsewhere classified: Secondary | ICD-10-CM | POA: Diagnosis not present

## 2023-04-09 ENCOUNTER — Other Ambulatory Visit: Payer: Self-pay | Admitting: Nurse Practitioner

## 2023-04-09 DIAGNOSIS — M5030 Other cervical disc degeneration, unspecified cervical region: Secondary | ICD-10-CM

## 2023-04-11 DIAGNOSIS — M25662 Stiffness of left knee, not elsewhere classified: Secondary | ICD-10-CM | POA: Diagnosis not present

## 2023-04-11 DIAGNOSIS — M25562 Pain in left knee: Secondary | ICD-10-CM | POA: Diagnosis not present

## 2023-04-13 DIAGNOSIS — M5451 Vertebrogenic low back pain: Secondary | ICD-10-CM | POA: Diagnosis not present

## 2023-04-18 DIAGNOSIS — M24662 Ankylosis, left knee: Secondary | ICD-10-CM | POA: Diagnosis not present

## 2023-04-18 DIAGNOSIS — Z96652 Presence of left artificial knee joint: Secondary | ICD-10-CM | POA: Diagnosis not present

## 2023-04-18 DIAGNOSIS — G8918 Other acute postprocedural pain: Secondary | ICD-10-CM | POA: Diagnosis not present

## 2023-04-20 ENCOUNTER — Encounter: Payer: Self-pay | Admitting: Pharmacist

## 2023-04-20 DIAGNOSIS — M25662 Stiffness of left knee, not elsewhere classified: Secondary | ICD-10-CM | POA: Diagnosis not present

## 2023-04-20 DIAGNOSIS — M25562 Pain in left knee: Secondary | ICD-10-CM | POA: Diagnosis not present

## 2023-04-20 NOTE — Progress Notes (Signed)
[facility]) [facility] Quality Pharmacy Team Statin Quality Measure Assessment  04/20/2023  Tricia Oaxaca Mar 24, 1956 [id number]  Per review of chart and payor information, patient has a diagnosis of diabetes but is not currently filling a statin prescription.  This places patient into the Statin Use In Patients with Diabetes (SUPD) measure for CMS.    Patient has documented trials of statins with reported myalgia, but no corresponding CPT codes that would exclude patient from SUPD measure. He is on Repatha and Ezetimibe.  Patient has an upcoming appointment 04/21/2023.  If deemed therapeutically appropriate, a statin exclusion code could be associated with the upcoming visit.  Associating a statin exclusion code with the upcoming visit would remove the patient from the measure.  The ASCVD Risk score (Arnett DK, et al., 2019) failed to calculate for the following reasons:   The valid total cholesterol range is 130 to 320 mg/dL 01/28/6643     Component Value Date/Time   CHOL 87 (L) 11/26/2022 1427   TRIG 183 (H) 11/26/2022 1427   HDL 33 (L) 11/26/2022 1427   CHOLHDL 2.6 11/26/2022 1427   LDLCALC 24 11/26/2022 1427    Please consider ONE of the following recommendations:  Initiate high intensity statin Atorvastatin 40 mg once daily, #90, 3 refills   Rosuvastatin 20 mg once daily, #90, 3 refills    Initiate moderate intensity          statin with reduced frequency if prior          statin intolerance 1x weekly, #13, 3 refills   2x weekly, #26, 3 refills   3x weekly, #39, 3 refills    Code for past statin intolerance or  other exclusions (required annually)  Provider Requirements: Associate code during an office visit or telehealth encounter  Drug Induced Myopathy G72.0   Myopathy, unspecified G72.9   Myositis, unspecified M60.9   Rhabdomyolysis M62.82   Cirrhosis of liver K74.69   Prediabetes R73.03   PCOS E28.2   Plan: Route note to Patient's Provider prior to  the upcoming appointment.  Beecher Mcardle, PharmD, BCACP [facility] Clinical Pharmacist [phone]

## 2023-04-21 ENCOUNTER — Ambulatory Visit: Payer: Medicare HMO | Admitting: Nurse Practitioner

## 2023-04-22 ENCOUNTER — Encounter: Payer: Self-pay | Admitting: Urology

## 2023-04-22 ENCOUNTER — Ambulatory Visit: Payer: Medicare HMO | Admitting: Urology

## 2023-04-22 VITALS — BP 134/80 | HR 88

## 2023-04-22 DIAGNOSIS — N401 Enlarged prostate with lower urinary tract symptoms: Secondary | ICD-10-CM | POA: Diagnosis not present

## 2023-04-22 DIAGNOSIS — M25562 Pain in left knee: Secondary | ICD-10-CM | POA: Diagnosis not present

## 2023-04-22 DIAGNOSIS — R339 Retention of urine, unspecified: Secondary | ICD-10-CM | POA: Diagnosis not present

## 2023-04-22 DIAGNOSIS — N138 Other obstructive and reflux uropathy: Secondary | ICD-10-CM

## 2023-04-22 DIAGNOSIS — M25662 Stiffness of left knee, not elsewhere classified: Secondary | ICD-10-CM | POA: Diagnosis not present

## 2023-04-22 LAB — URINALYSIS, ROUTINE W REFLEX MICROSCOPIC
Bilirubin, UA: NEGATIVE
Glucose, UA: NEGATIVE
Ketones, UA: NEGATIVE
Leukocytes,UA: NEGATIVE
Nitrite, UA: NEGATIVE
Protein,UA: NEGATIVE
RBC, UA: NEGATIVE
Specific Gravity, UA: 1.015 (ref 1.005–1.030)
Urobilinogen, Ur: 0.2 mg/dL (ref 0.2–1.0)
pH, UA: 5.5 (ref 5.0–7.5)

## 2023-04-22 MED ORDER — FINASTERIDE 5 MG PO TABS: 5.0000 mg | ORAL_TABLET | Freq: Every day | ORAL | 1 refills | Status: DC

## 2023-04-22 MED ORDER — SILODOSIN 8 MG PO CAPS: 8.0000 mg | ORAL_CAPSULE | Freq: Two times a day (BID) | ORAL | 3 refills | Status: DC

## 2023-04-22 NOTE — Progress Notes (Signed)
04/22/2023 12:53 PM   Roger Guerra 12/06/1955 [id number]  Referring provider: Bennie Pierini, FNP [address] [place],  [place] [address]  Followup BPH and urinary frequency   HPI: Mr Guerra is a 62ZH here for followup for BPH with urinary frequency and incomplete emptying. PVR 122. IPSS 13 QOL 2 on rapaflo 8mg  BID and finasteride. Urine stream strong. Urinary frequency every 2 hours. He drinks a pot of coffee daily.    PMH: Past Medical History:  Diagnosis Date   Anxiety disorder    Arthritis    Atherosclerosis    Back pain    Cervical radiculopathy    Constipation    Coronary artery calcification seen on CT scan    Depression    Diabetes (HCC)    Erectile dysfunction    GERD (gastroesophageal reflux disease)    Heartburn    History of blood clots    History of DVT (deep vein thrombosis)    History of DVT of lower extremity    Right leg 2002    History of pneumonia    Hyperlipidemia    Hypogonadism in male    Hypopituitarism (HCC)    Joint pain    Nerve root and plexus disorder    Obesity    Obstructive sleep apnea    OSA on CPAP    Osteoarthritis    Sciatica    Sleep apnea    SOBOE (shortness of breath on exertion)    Spinal stenosis    Spondylosis    Swelling of both lower extremities    Type 2 diabetes mellitus (HCC)    Vitamin D deficiency     Surgical History: Past Surgical History:  Procedure Laterality Date   BLADDER SURGERY  2015   removal of skin tag near urethra   COLONOSCOPY N/A 12/10/2016   Procedure: COLONOSCOPY;  Surgeon: Corbin Ade, MD;  [facility]: [facility];  Service: Endoscopy;  Laterality: N/A;  8:30 AM   COLONOSCOPY WITH PROPOFOL N/A 04/29/2022   Procedure: COLONOSCOPY WITH PROPOFOL;  Surgeon: Corbin Ade, MD;  Location: [facility];  Service: Endoscopy;  Laterality: N/A;  8:00am   KNEE ARTHROSCOPY WITH MEDIAL MENISECTOMY Right 08/25/2017   Procedure: Right knee arthroscopy, partial medial menisectomy  and debridement;  Surgeon: Jene Every, MD;  Location: [facility];  Service: Orthopedics;  Laterality: Right;  60 mins   POLYPECTOMY  12/10/2016   Procedure: POLYPECTOMY;  Surgeon: Corbin Ade, MD;  Location: [facility];  Service: Endoscopy;;  hepatic flexure   PTOSIS REPAIR Bilateral 02/2022   TONSILLECTOMY     TOTAL KNEE ARTHROPLASTY Left 03/02/2023   Procedure: TOTAL KNEE ARTHROPLASTY;  Surgeon: Jene Every, MD;  Location: [facility];  Service: Orthopedics;  Laterality: Left;   WISDOM TOOTH EXTRACTION      Home Medications:  Allergies as of 04/22/2023       Reactions   Invokana [canagliflozin] Other (See Comments)   Yeast infection   Statins Other (See Comments)   Severe muscle cramps        Medication List        Accurate as of April 22, 2023 12:53 PM. If you have any questions, ask your nurse or doctor.          5-HTP PO Take 150 mg by mouth daily.   AMBULATORY NON FORMULARY MEDICATION Medication Name: Trimix  PGE 30 mcg Pap 30 mg Phent 1 mg What changed:  when to take this reasons to take this  apixaban 2.5 MG Tabs tablet Commonly known as: ELIQUIS Take 1 tablet (2.5 mg total) by mouth 2 (two) times daily.   cetirizine 10 MG tablet Commonly known as: ZYRTEC Take 10 mg by mouth every evening.   clotrimazole-betamethasone cream Commonly known as: Lotrisone Apply 1 Application topically 2 (two) times daily.   COENZYME Q10 PO Take 200 mg by mouth daily.   docusate sodium 100 MG capsule Commonly known as: Colace Take 1 capsule (100 mg total) by mouth 2 (two) times daily as needed for mild constipation.   ezetimibe 10 MG tablet Commonly known as: ZETIA Take 1 tablet (10 mg total) by mouth daily.   fexofenadine 180 MG tablet Commonly known as: ALLEGRA Take 180 mg by mouth every morning.   finasteride 5 MG tablet Commonly known as: PROSCAR Take 1 tablet (5 mg total) by mouth daily.   gabapentin 300 MG capsule Commonly known as:  NEURONTIN Take 1 capsule (300 mg total) by mouth 3 (three) times daily.   glipiZIDE 10 MG tablet Commonly known as: GLUCOTROL Take 1 tablet (10 mg total) by mouth 2 (two) times daily before a meal.   hydrocortisone cream 1 % Apply to affected area 2 times daily What changed:  how much to take how to take this when to take this reasons to take this additional instructions   HYDROmorphone 2 MG tablet Commonly known as: DILAUDID Take 1-2 tablets (2-4 mg total) by mouth every 3 (three) hours as needed for severe pain (breakthrough pain).   Luer Lock Safety Syringes 22G X 1" 3 ML Misc Generic drug: SYRINGE-NEEDLE (DISP) 3 ML For testosterone  injections   MAGNESIUM MALATE PO Take 1,000 mg by mouth daily.   methocarbamol 500 MG tablet Commonly known as: ROBAXIN Take 1 tablet (500 mg total) by mouth every 8 (eight) hours as needed for muscle spasms.   multivitamin with minerals Tabs tablet Take 1 tablet by mouth daily.   omeprazole 20 MG tablet Commonly known as: PRILOSEC OTC Take 1 tablet (20 mg total) by mouth daily as needed (acid reflux).   ondansetron 4 MG tablet Commonly known as: Zofran Take 1 tablet (4 mg total) by mouth every 8 (eight) hours as needed for nausea or vomiting.   OneTouch Ultra test strip Generic drug: glucose blood check blood sugar once daily. DX:E11.69   Pen Needles 31G X 6 MM Misc 1 each by Does not apply route daily.   polyethylene glycol 17 g packet Commonly known as: MIRALAX / GLYCOLAX Take 17 g by mouth daily.   Repatha SureClick 140 MG/ML Soaj Generic drug: Evolocumab Inject 140 mg into the skin every 14 (fourteen) days.   silodosin 8 MG Caps capsule Commonly known as: RAPAFLO Take 1 capsule (8 mg total) by mouth in the morning and at bedtime.   sodium chloride 0.65 % Soln nasal spray Commonly known as: OCEAN Place 1 spray into both nostrils 2 (two) times daily.   testosterone cypionate 200 MG/ML injection Commonly known as:  DEPOTESTOSTERONE CYPIONATE Inject 100 mg into the muscle every 14 (fourteen) days. For IM use only   Trulicity 3 MG/0.5ML Sopn Generic drug: Dulaglutide Inject 3 mg as directed once a week.   Vitamin D3 125 MCG (5000 UT) Caps Take 5,000 Units by mouth daily.   zinc gluconate 50 MG tablet Take 50 mg by mouth daily.        Allergies:  Allergies  Allergen Reactions   Invokana [Canagliflozin] Other (See Comments)    Yeast  infection   Statins Other (See Comments)    Severe muscle cramps    Family History: Family History  Problem Relation Age of Onset   Diabetes Mother    Lung cancer Father    Heart disease Father    Stroke Father    Cancer Father    Diabetes Sister    Diabetes Other        Siblings    Social History:  reports that he has quit smoking. His smoking use included cigarettes. He has a 37.5 pack-year smoking history. He has never used smokeless tobacco. He reports that he does not drink alcohol and does not use drugs.  ROS: All other review of systems were reviewed and are negative except what is noted above in HPI  Physical Exam: BP 134/80   Pulse 88   Constitutional:  Alert and oriented, No acute distress. HEENT: [place] AT, moist mucus membranes.  Trachea midline, no masses. Cardiovascular: No clubbing, cyanosis, or edema. Respiratory: Normal respiratory effort, no increased work of breathing. GI: Abdomen is soft, nontender, nondistended, no abdominal masses GU: No CVA tenderness.  Lymph: No cervical or inguinal lymphadenopathy. Skin: No rashes, bruises or suspicious lesions. Neurologic: Grossly intact, no focal deficits, moving all 4 extremities. Psychiatric: Normal mood and affect.  Laboratory Data: Lab Results  Component Value Date   WBC 17.2 (H) 03/04/2023   HGB 13.9 03/04/2023   HCT 40.3 03/04/2023   MCV 90.0 03/04/2023   PLT 193 03/04/2023    Lab Results  Component Value Date   CREATININE 0.92 03/05/2023    No results found for:  "PSA"  Lab Results  Component Value Date   TESTOSTERONE 592 06/16/2021    Lab Results  Component Value Date   HGBA1C 7.5 (H) 02/23/2023    Urinalysis    Component Value Date/Time   APPEARANCEUR Clear 09/24/2022 1002   GLUCOSEU 3+ (A) 09/24/2022 1002   BILIRUBINUR Negative 09/24/2022 1002   PROTEINUR 1+ (A) 09/24/2022 1002   NITRITE Negative 09/24/2022 1002   LEUKOCYTESUR Negative 09/24/2022 1002    Lab Results  Component Value Date   LABMICR Comment 09/08/2022    Pertinent Imaging:  No results found for this or any previous visit.  No results found for this or any previous visit.  No results found for this or any previous visit.  No results found for this or any previous visit.  No results found for this or any previous visit.  No valid procedures specified. No results found for this or any previous visit.  No results found for this or any previous visit.   Assessment & Plan:    1. Incomplete bladder emptying Rapaflo 8mg  daily and finasteride 5mg  daily - Urinalysis, Routine w reflex microscopic - BLADDER SCAN AMB NON-IMAGING  2. Benign prostatic hyperplasia with urinary obstruction -rapaflo 8mg  daily and finasteride 5mg  daily   No follow-ups on file.  Wilkie Aye, MD  [facility] [place]

## 2023-04-25 DIAGNOSIS — M25662 Stiffness of left knee, not elsewhere classified: Secondary | ICD-10-CM | POA: Diagnosis not present

## 2023-04-25 DIAGNOSIS — E1165 Type 2 diabetes mellitus with hyperglycemia: Secondary | ICD-10-CM | POA: Diagnosis not present

## 2023-04-25 DIAGNOSIS — M25562 Pain in left knee: Secondary | ICD-10-CM | POA: Diagnosis not present

## 2023-04-25 DIAGNOSIS — E559 Vitamin D deficiency, unspecified: Secondary | ICD-10-CM | POA: Diagnosis not present

## 2023-04-25 DIAGNOSIS — I10 Essential (primary) hypertension: Secondary | ICD-10-CM | POA: Diagnosis not present

## 2023-04-26 ENCOUNTER — Encounter: Payer: Self-pay | Admitting: Urology

## 2023-04-26 DIAGNOSIS — M5416 Radiculopathy, lumbar region: Secondary | ICD-10-CM | POA: Diagnosis not present

## 2023-04-26 NOTE — Patient Instructions (Signed)

## 2023-04-27 DIAGNOSIS — M25662 Stiffness of left knee, not elsewhere classified: Secondary | ICD-10-CM | POA: Diagnosis not present

## 2023-04-27 DIAGNOSIS — M25562 Pain in left knee: Secondary | ICD-10-CM | POA: Diagnosis not present

## 2023-04-29 DIAGNOSIS — M25662 Stiffness of left knee, not elsewhere classified: Secondary | ICD-10-CM | POA: Diagnosis not present

## 2023-04-29 DIAGNOSIS — M25562 Pain in left knee: Secondary | ICD-10-CM | POA: Diagnosis not present

## 2023-04-30 ENCOUNTER — Other Ambulatory Visit: Payer: Self-pay | Admitting: Internal Medicine

## 2023-05-02 DIAGNOSIS — M25662 Stiffness of left knee, not elsewhere classified: Secondary | ICD-10-CM | POA: Diagnosis not present

## 2023-05-02 DIAGNOSIS — M25562 Pain in left knee: Secondary | ICD-10-CM | POA: Diagnosis not present

## 2023-05-03 ENCOUNTER — Other Ambulatory Visit: Payer: Self-pay | Admitting: Nurse Practitioner

## 2023-05-03 DIAGNOSIS — I10 Essential (primary) hypertension: Secondary | ICD-10-CM | POA: Diagnosis not present

## 2023-05-03 DIAGNOSIS — M5030 Other cervical disc degeneration, unspecified cervical region: Secondary | ICD-10-CM | POA: Diagnosis not present

## 2023-05-03 DIAGNOSIS — K219 Gastro-esophageal reflux disease without esophagitis: Secondary | ICD-10-CM | POA: Diagnosis not present

## 2023-05-03 DIAGNOSIS — E1169 Type 2 diabetes mellitus with other specified complication: Secondary | ICD-10-CM

## 2023-05-03 DIAGNOSIS — E559 Vitamin D deficiency, unspecified: Secondary | ICD-10-CM | POA: Diagnosis not present

## 2023-05-03 DIAGNOSIS — E1165 Type 2 diabetes mellitus with hyperglycemia: Secondary | ICD-10-CM | POA: Diagnosis not present

## 2023-05-03 DIAGNOSIS — Z0001 Encounter for general adult medical examination with abnormal findings: Secondary | ICD-10-CM | POA: Diagnosis not present

## 2023-05-03 DIAGNOSIS — M170 Bilateral primary osteoarthritis of knee: Secondary | ICD-10-CM | POA: Diagnosis not present

## 2023-05-03 DIAGNOSIS — N401 Enlarged prostate with lower urinary tract symptoms: Secondary | ICD-10-CM | POA: Diagnosis not present

## 2023-05-03 DIAGNOSIS — Z86718 Personal history of other venous thrombosis and embolism: Secondary | ICD-10-CM | POA: Diagnosis not present

## 2023-05-03 DIAGNOSIS — E782 Mixed hyperlipidemia: Secondary | ICD-10-CM | POA: Diagnosis not present

## 2023-05-03 DIAGNOSIS — G4733 Obstructive sleep apnea (adult) (pediatric): Secondary | ICD-10-CM | POA: Diagnosis not present

## 2023-05-04 DIAGNOSIS — M25662 Stiffness of left knee, not elsewhere classified: Secondary | ICD-10-CM | POA: Diagnosis not present

## 2023-05-04 DIAGNOSIS — M25562 Pain in left knee: Secondary | ICD-10-CM | POA: Diagnosis not present

## 2023-05-06 DIAGNOSIS — M25562 Pain in left knee: Secondary | ICD-10-CM | POA: Diagnosis not present

## 2023-05-06 DIAGNOSIS — M25662 Stiffness of left knee, not elsewhere classified: Secondary | ICD-10-CM | POA: Diagnosis not present

## 2023-05-17 DIAGNOSIS — M25662 Stiffness of left knee, not elsewhere classified: Secondary | ICD-10-CM | POA: Diagnosis not present

## 2023-05-17 DIAGNOSIS — M25562 Pain in left knee: Secondary | ICD-10-CM | POA: Diagnosis not present

## 2023-05-19 ENCOUNTER — Other Ambulatory Visit: Payer: Self-pay | Admitting: Medical Genetics

## 2023-05-19 DIAGNOSIS — M25562 Pain in left knee: Secondary | ICD-10-CM | POA: Diagnosis not present

## 2023-05-19 DIAGNOSIS — M25662 Stiffness of left knee, not elsewhere classified: Secondary | ICD-10-CM | POA: Diagnosis not present

## 2023-05-19 DIAGNOSIS — Z006 Encounter for examination for normal comparison and control in clinical research program: Secondary | ICD-10-CM

## 2023-05-24 DIAGNOSIS — M25562 Pain in left knee: Secondary | ICD-10-CM | POA: Diagnosis not present

## 2023-05-24 DIAGNOSIS — M25662 Stiffness of left knee, not elsewhere classified: Secondary | ICD-10-CM | POA: Diagnosis not present

## 2023-05-26 DIAGNOSIS — M25562 Pain in left knee: Secondary | ICD-10-CM | POA: Diagnosis not present

## 2023-05-26 DIAGNOSIS — M25662 Stiffness of left knee, not elsewhere classified: Secondary | ICD-10-CM | POA: Diagnosis not present

## 2023-05-31 DIAGNOSIS — M25662 Stiffness of left knee, not elsewhere classified: Secondary | ICD-10-CM | POA: Diagnosis not present

## 2023-05-31 DIAGNOSIS — M25562 Pain in left knee: Secondary | ICD-10-CM | POA: Diagnosis not present

## 2023-06-02 DIAGNOSIS — M25662 Stiffness of left knee, not elsewhere classified: Secondary | ICD-10-CM | POA: Diagnosis not present

## 2023-06-02 DIAGNOSIS — M25562 Pain in left knee: Secondary | ICD-10-CM | POA: Diagnosis not present

## 2023-06-07 DIAGNOSIS — M25662 Stiffness of left knee, not elsewhere classified: Secondary | ICD-10-CM | POA: Diagnosis not present

## 2023-06-07 DIAGNOSIS — M25562 Pain in left knee: Secondary | ICD-10-CM | POA: Diagnosis not present

## 2023-06-08 DIAGNOSIS — M9902 Segmental and somatic dysfunction of thoracic region: Secondary | ICD-10-CM | POA: Diagnosis not present

## 2023-06-08 DIAGNOSIS — M62830 Muscle spasm of back: Secondary | ICD-10-CM | POA: Diagnosis not present

## 2023-06-08 DIAGNOSIS — M546 Pain in thoracic spine: Secondary | ICD-10-CM | POA: Diagnosis not present

## 2023-06-08 DIAGNOSIS — M9905 Segmental and somatic dysfunction of pelvic region: Secondary | ICD-10-CM | POA: Diagnosis not present

## 2023-06-08 DIAGNOSIS — M9903 Segmental and somatic dysfunction of lumbar region: Secondary | ICD-10-CM | POA: Diagnosis not present

## 2023-06-08 DIAGNOSIS — M9901 Segmental and somatic dysfunction of cervical region: Secondary | ICD-10-CM | POA: Diagnosis not present

## 2023-06-09 DIAGNOSIS — M25562 Pain in left knee: Secondary | ICD-10-CM | POA: Diagnosis not present

## 2023-06-09 DIAGNOSIS — M25662 Stiffness of left knee, not elsewhere classified: Secondary | ICD-10-CM | POA: Diagnosis not present

## 2023-06-09 DIAGNOSIS — M545 Low back pain, unspecified: Secondary | ICD-10-CM | POA: Diagnosis not present

## 2023-06-10 DIAGNOSIS — M1711 Unilateral primary osteoarthritis, right knee: Secondary | ICD-10-CM | POA: Diagnosis not present

## 2023-06-16 DIAGNOSIS — M1711 Unilateral primary osteoarthritis, right knee: Secondary | ICD-10-CM | POA: Diagnosis not present

## 2023-06-27 DIAGNOSIS — G4733 Obstructive sleep apnea (adult) (pediatric): Secondary | ICD-10-CM | POA: Diagnosis not present

## 2023-06-28 DIAGNOSIS — M1711 Unilateral primary osteoarthritis, right knee: Secondary | ICD-10-CM | POA: Diagnosis not present

## 2023-06-30 ENCOUNTER — Other Ambulatory Visit: Payer: Self-pay | Admitting: *Deleted

## 2023-06-30 DIAGNOSIS — E1165 Type 2 diabetes mellitus with hyperglycemia: Secondary | ICD-10-CM

## 2023-06-30 MED ORDER — TRULICITY 3 MG/0.5ML ~~LOC~~ SOPN
3.0000 mg | PEN_INJECTOR | SUBCUTANEOUS | 0 refills | Status: AC
Start: 2023-06-30 — End: ?

## 2023-07-04 DIAGNOSIS — M9905 Segmental and somatic dysfunction of pelvic region: Secondary | ICD-10-CM | POA: Diagnosis not present

## 2023-07-04 DIAGNOSIS — M62830 Muscle spasm of back: Secondary | ICD-10-CM | POA: Diagnosis not present

## 2023-07-04 DIAGNOSIS — M9901 Segmental and somatic dysfunction of cervical region: Secondary | ICD-10-CM | POA: Diagnosis not present

## 2023-07-04 DIAGNOSIS — M9903 Segmental and somatic dysfunction of lumbar region: Secondary | ICD-10-CM | POA: Diagnosis not present

## 2023-07-04 DIAGNOSIS — M546 Pain in thoracic spine: Secondary | ICD-10-CM | POA: Diagnosis not present

## 2023-07-04 DIAGNOSIS — M9902 Segmental and somatic dysfunction of thoracic region: Secondary | ICD-10-CM | POA: Diagnosis not present

## 2023-07-13 DIAGNOSIS — M5412 Radiculopathy, cervical region: Secondary | ICD-10-CM | POA: Diagnosis not present

## 2023-07-13 DIAGNOSIS — M5416 Radiculopathy, lumbar region: Secondary | ICD-10-CM | POA: Diagnosis not present

## 2023-07-25 ENCOUNTER — Other Ambulatory Visit: Payer: Self-pay | Admitting: Pharmacist

## 2023-07-25 MED ORDER — REPATHA SURECLICK 140 MG/ML ~~LOC~~ SOAJ: 140.0000 mg | SUBCUTANEOUS | 3 refills | Status: DC

## 2023-08-02 DIAGNOSIS — M5412 Radiculopathy, cervical region: Secondary | ICD-10-CM | POA: Diagnosis not present

## 2023-08-10 DIAGNOSIS — I10 Essential (primary) hypertension: Secondary | ICD-10-CM | POA: Diagnosis not present

## 2023-08-10 DIAGNOSIS — E291 Testicular hypofunction: Secondary | ICD-10-CM | POA: Diagnosis not present

## 2023-08-10 DIAGNOSIS — E1169 Type 2 diabetes mellitus with other specified complication: Secondary | ICD-10-CM | POA: Diagnosis not present

## 2023-08-10 DIAGNOSIS — Z125 Encounter for screening for malignant neoplasm of prostate: Secondary | ICD-10-CM | POA: Diagnosis not present

## 2023-08-10 DIAGNOSIS — E559 Vitamin D deficiency, unspecified: Secondary | ICD-10-CM | POA: Diagnosis not present

## 2023-08-18 DIAGNOSIS — Z86718 Personal history of other venous thrombosis and embolism: Secondary | ICD-10-CM | POA: Diagnosis not present

## 2023-08-18 DIAGNOSIS — K219 Gastro-esophageal reflux disease without esophagitis: Secondary | ICD-10-CM | POA: Diagnosis not present

## 2023-08-18 DIAGNOSIS — J302 Other seasonal allergic rhinitis: Secondary | ICD-10-CM | POA: Diagnosis not present

## 2023-08-18 DIAGNOSIS — M170 Bilateral primary osteoarthritis of knee: Secondary | ICD-10-CM | POA: Diagnosis not present

## 2023-08-18 DIAGNOSIS — E559 Vitamin D deficiency, unspecified: Secondary | ICD-10-CM | POA: Diagnosis not present

## 2023-08-18 DIAGNOSIS — E119 Type 2 diabetes mellitus without complications: Secondary | ICD-10-CM | POA: Diagnosis not present

## 2023-08-18 DIAGNOSIS — G4733 Obstructive sleep apnea (adult) (pediatric): Secondary | ICD-10-CM | POA: Diagnosis not present

## 2023-08-18 DIAGNOSIS — I10 Essential (primary) hypertension: Secondary | ICD-10-CM | POA: Diagnosis not present

## 2023-08-18 DIAGNOSIS — E782 Mixed hyperlipidemia: Secondary | ICD-10-CM | POA: Diagnosis not present

## 2023-08-18 DIAGNOSIS — M5030 Other cervical disc degeneration, unspecified cervical region: Secondary | ICD-10-CM | POA: Diagnosis not present

## 2023-08-18 DIAGNOSIS — N401 Enlarged prostate with lower urinary tract symptoms: Secondary | ICD-10-CM | POA: Diagnosis not present

## 2023-08-18 DIAGNOSIS — E1169 Type 2 diabetes mellitus with other specified complication: Secondary | ICD-10-CM | POA: Diagnosis not present

## 2023-08-19 DIAGNOSIS — M545 Low back pain, unspecified: Secondary | ICD-10-CM | POA: Diagnosis not present

## 2023-08-19 DIAGNOSIS — M1990 Unspecified osteoarthritis, unspecified site: Secondary | ICD-10-CM | POA: Diagnosis not present

## 2023-08-19 DIAGNOSIS — G4733 Obstructive sleep apnea (adult) (pediatric): Secondary | ICD-10-CM | POA: Diagnosis not present

## 2023-08-19 DIAGNOSIS — E1122 Type 2 diabetes mellitus with diabetic chronic kidney disease: Secondary | ICD-10-CM | POA: Diagnosis not present

## 2023-08-19 DIAGNOSIS — E785 Hyperlipidemia, unspecified: Secondary | ICD-10-CM | POA: Diagnosis not present

## 2023-08-19 DIAGNOSIS — M62838 Other muscle spasm: Secondary | ICD-10-CM | POA: Diagnosis not present

## 2023-08-19 DIAGNOSIS — K219 Gastro-esophageal reflux disease without esophagitis: Secondary | ICD-10-CM | POA: Diagnosis not present

## 2023-08-19 DIAGNOSIS — I129 Hypertensive chronic kidney disease with stage 1 through stage 4 chronic kidney disease, or unspecified chronic kidney disease: Secondary | ICD-10-CM | POA: Diagnosis not present

## 2023-08-19 DIAGNOSIS — I2510 Atherosclerotic heart disease of native coronary artery without angina pectoris: Secondary | ICD-10-CM | POA: Diagnosis not present

## 2023-08-19 DIAGNOSIS — Z794 Long term (current) use of insulin: Secondary | ICD-10-CM | POA: Diagnosis not present

## 2023-08-19 DIAGNOSIS — R32 Unspecified urinary incontinence: Secondary | ICD-10-CM | POA: Diagnosis not present

## 2023-08-19 DIAGNOSIS — Z008 Encounter for other general examination: Secondary | ICD-10-CM | POA: Diagnosis not present

## 2023-08-30 NOTE — Progress Notes (Unsigned)
Cardiology Office Note:  .   Date:  08/31/2023  ID:  Roger Guerra, DOB [birth year], MRN [id number] PCP: Benita Stabile, MD  [facility] Providers Cardiologist:  Nona Dell, MD    Patient Profile: .      PMH Dyslipidemia Coronary artery calcification Type 2 diabetes mellitus Nuclear stress test 12/2020 Low risk study, normal EF OSA Hypopituitarism DVT Statin intolerance Low vitamin d level  Referred to Advanced Lipid disorder clinic and seen by Dr. Rennis Golden 03/16/21.  He reported intolerance to statins namely rosuvastatin and pravastatin which caused profound myalgias and he developed thrombosis after starting 1 statin which he thought might be related.  His most recent lipid profile at that time revealed total cholesterol 215, triglycerides 111, HDL 38, and LDL 157.  He was hesitant to take another statin. Dr. Rennis Golden reviewed cardiovascular reduction risk benefit of statin therapy. He had been referred to weight loss clinic.  He was felt to have metabolic dyslipidemia with low HDL and elevated LDL he was felt to have metabolic dyslipidemia with low HDL and elevated LDL fitting the metabolic pattern of diabetes and hypopituitary is him.  He was agreeable to start ezetimibe 10 mg daily and continue to work on weight loss and dietary changes.  Lipids improved but were not at goal with ezetimibe alone.    Attempted to get insurance coverage for [facility] which was denied.  He did start Praluent 75 mg every 2 weeks and continued ezetimibe.  At follow-up visit 04/20/2022 he reported joint pain, this is different from pain associated with statin therapy.  LDL-P now 720 down from 1246, LDL-C 48 down from 97, and triglycerides 68 down from 259.  He was advised to take an ezetimibe holiday to see if this improved joint pain as it was felt Praluent was less likely to cause the symptoms.  Due to change in insurance, we received a notification 09/2022 that Repatha is now preferred and patient was  started on Repatha 140 mg every 2 weeks.       History of Present Illness: .   Roger Guerra is a very pleasant 67 y.o. male who is here today for follow-up of dyslipidemia. He reports feeling fatigued, especially in the mornings, and occasional chest pain that does not worsen with exertion. He wonders if it is due to heartburn. Reports having orthostatic hypotension, which was particularly problematic during a recent knee replacement surgery. He maintains an active lifestyle, walking three times a day and doing mat exercises. He follows a low-carb, high-fat diet, which he finds helps with weight management and blood sugar control. Admits to struggling with discipline at times. Has significant family history of heart disease, with his father having had a quintuple bypass at 51 and his brother having had a significant heart attack three years ago. Prior to his father, name and on that side of the family had lived past age 65.  He denies shortness of breath, orthopnea, PND, edema, presyncope, syncope.   Discussed the use of AI scribe software for clinical note transcription with the patient, who gave verbal consent to proceed.   ROS: See HPI       Studies Reviewed: Marland Kitchen   EKG Interpretation Date/Time:  Wednesday August 31 2023 10:38:49 EST Ventricular Rate:  85 PR Interval:  204 QRS Duration:  88 QT Interval:  350 QTC Calculation: 416 R Axis:   -25  Text Interpretation: Normal sinus rhythm Inferior infarct (cited on or before 22-Aug-2017) Questionable change in initial forces  of Inferior leads No acute changes from previous ECG 07/2022 Confirmed by Eligha Bridegroom [phone]) on 08/31/2023 10:44:56 AM    Risk Assessment/Calculations:             Physical Exam:   VS:  BP 120/80 (BP Location: Left Arm, Patient Position: Sitting, Cuff Size: Normal)   Pulse 84   Ht 5\' 11"  (1.803 m)   Wt 269 lb (122 kg)   BMI 37.52 kg/m    Wt Readings from Last 3 Encounters:  08/31/23 269 lb (122 kg)   03/02/23 261 lb 11 oz (118.7 kg)  02/23/23 261 lb 9.6 oz (118.7 kg)    GEN: Well nourished, well developed in no acute distress NECK: No JVD; No carotid bruits CARDIAC: RRR, no murmurs, rubs, gallops RESPIRATORY:  Clear to auscultation. Expiratory wheezing noted on occasion. No rhonchi  ABDOMEN: Soft, non-tender, non-distended EXTREMITIES:  No edema; No deformity     ASSESSMENT AND PLAN: .    Dyslipidemia LDL goal < 70: Lipid panel completed 08/10/2023 with total cholesterol 84, HDL 40, LDL 28, and triglycerides 78.  We discussed discontinuing ezetimibe, however he is not having any concerning side effects and does not want to risk increase in LDL off the medication.  He is tolerating Repatha without concerning side effects. Lengthy discussion about healthy diet, weight loss, and exercise.  He has strong feelings that his body responds best to [employer], consuming < 20 grams carbohydrates.  Encouraged avoiding saturated fat as much as possible and replacing with monounsaturated fats.  Continue ezetimibe and Repatha. Consider discontinuation of ezetimibe if there is further reduction in LDL.   Coronary artery calcification: Prior chest CT revealed coronary and aortic calcification.  Due to risk factors of family history, hyperlipidemia, diabetes, and hypertension, we will get CT calcium score for further risk stratification.  Advised him that EKG shows possible prior inferior infarct, no significant change from EKG 07/2022. He reports some fatigue and occasional chest pain but no worsening symptoms with exertion. Encouraged him to continue moderate intensity exercise and let us know if symptoms worsen. Continue  Diabetes: A1C 8.6% which he reports is an increase since he has not been strictly following keto diet.  Lengthy discussion as noted above about avoiding saturated fat and instead increasing intake of healthier fats. Management per PCP.  Hypertension: PCP recently started losartan 25  mg daily.  He reports significant orthostatic hypotension during knee replacement surgery which required extended hospitalization.  He monitors BP consistently and it has been well-controlled. Continue losartan.        Dispo: 6 months with Dr. Rennis Golden or me  Signed, Eligha Bridegroom, NP-C

## 2023-08-31 ENCOUNTER — Ambulatory Visit (HOSPITAL_BASED_OUTPATIENT_CLINIC_OR_DEPARTMENT_OTHER): Payer: Medicare HMO | Admitting: Nurse Practitioner

## 2023-08-31 ENCOUNTER — Encounter (HOSPITAL_BASED_OUTPATIENT_CLINIC_OR_DEPARTMENT_OTHER): Payer: Self-pay | Admitting: Nurse Practitioner

## 2023-08-31 ENCOUNTER — Other Ambulatory Visit: Payer: Self-pay | Admitting: Nurse Practitioner

## 2023-08-31 VITALS — BP 120/80 | HR 84 | Ht 71.0 in | Wt 269.0 lb

## 2023-08-31 DIAGNOSIS — M546 Pain in thoracic spine: Secondary | ICD-10-CM | POA: Diagnosis not present

## 2023-08-31 DIAGNOSIS — E1165 Type 2 diabetes mellitus with hyperglycemia: Secondary | ICD-10-CM | POA: Diagnosis not present

## 2023-08-31 DIAGNOSIS — I2510 Atherosclerotic heart disease of native coronary artery without angina pectoris: Secondary | ICD-10-CM

## 2023-08-31 DIAGNOSIS — M9902 Segmental and somatic dysfunction of thoracic region: Secondary | ICD-10-CM | POA: Diagnosis not present

## 2023-08-31 DIAGNOSIS — E785 Hyperlipidemia, unspecified: Secondary | ICD-10-CM | POA: Diagnosis not present

## 2023-08-31 DIAGNOSIS — M9901 Segmental and somatic dysfunction of cervical region: Secondary | ICD-10-CM | POA: Diagnosis not present

## 2023-08-31 DIAGNOSIS — E1169 Type 2 diabetes mellitus with other specified complication: Secondary | ICD-10-CM | POA: Diagnosis not present

## 2023-08-31 DIAGNOSIS — M9903 Segmental and somatic dysfunction of lumbar region: Secondary | ICD-10-CM | POA: Diagnosis not present

## 2023-08-31 DIAGNOSIS — M62830 Muscle spasm of back: Secondary | ICD-10-CM | POA: Diagnosis not present

## 2023-08-31 DIAGNOSIS — M9905 Segmental and somatic dysfunction of pelvic region: Secondary | ICD-10-CM | POA: Diagnosis not present

## 2023-08-31 MED ORDER — EZETIMIBE 10 MG PO TABS
10.0000 mg | ORAL_TABLET | Freq: Every day | ORAL | 3 refills | Status: DC
Start: 2023-08-31 — End: 2024-01-02

## 2023-08-31 NOTE — Patient Instructions (Signed)
Medication Instructions:   Your physician recommends that you continue on your current medications as directed. Please refer to the Current Medication list given to you today.   *If you need a refill on your cardiac medications before your next appointment, please call your pharmacy*   Lab Work:  None ordered.  If you have labs (blood work) drawn today and your tests are completely normal, you will receive your results only by: MyChart Message (if you have MyChart) OR A paper copy in the mail If you have any lab test that is abnormal or we need to change your treatment, we will call you to review the results.   Testing/Procedures:  Roger Guerra has ordered a CT coronary calcium score.   Test locations:   [facility]  This is $99 out of pocket.   Coronary CalciumScan A coronary calcium scan is an imaging test used to look for deposits of calcium and other fatty materials (plaques) in the inner lining of the blood vessels of the heart (coronary arteries). These deposits of calcium and plaques can partly clog and narrow the coronary arteries without producing any symptoms or warning signs. This puts a person at risk for a heart attack. This test can detect these deposits before symptoms develop. Tell a health care provider about: Any allergies you have. All medicines you are taking, including vitamins, herbs, eye drops, creams, and over-the-counter medicines. Any problems you or family members have had with anesthetic medicines. Any blood disorders you have. Any surgeries you have had. Any medical conditions you have. Whether you are pregnant or may be pregnant. What are the risks? Generally, this is a safe procedure. However, problems may occur, including: Harm to a pregnant woman and her unborn baby. This test involves the use of radiation. Radiation exposure can be dangerous to a pregnant woman and her unborn baby. If you are pregnant, you generally should not  have this procedure done. Slight increase in the risk of cancer. This is because of the radiation involved in the test. What happens before the procedure? No preparation is needed for this procedure. What happens during the procedure? You will undress and remove any jewelry around your neck or chest. You will put on a hospital gown. Sticky electrodes will be placed on your chest. The electrodes will be connected to an electrocardiogram (ECG) machine to record a tracing of the electrical activity of your heart. A CT scanner will take pictures of your heart. During this time, you will be asked to lie still and hold your breath for 2-3 seconds while a picture of your heart is being taken. The procedure may vary among health care providers and hospitals. What happens after the procedure? You can get dressed. You can return to your normal activities. It is up to you to get the results of your test. Ask your health care provider, or the department that is doing the test, when your results will be ready. Summary A coronary calcium scan is an imaging test used to look for deposits of calcium and other fatty materials (plaques) in the inner lining of the blood vessels of the heart (coronary arteries). Generally, this is a safe procedure. Tell your health care provider if you are pregnant or may be pregnant. No preparation is needed for this procedure. A CT scanner will take pictures of your heart. You can return to your normal activities after the scan is done. This information is not intended to replace advice given to you by your  health care provider. Make sure you discuss any questions you have with your health care provider. Document Released: 03/11/2008 Document Revised: 08/02/2016 Document Reviewed: 08/02/2016 Elsevier Interactive Patient Education  2017 [employer].    Follow-Up: At [facility], you and your health needs are our priority.  As part of our continuing mission to  provide you with exceptional heart care, we have created designated Provider Care Teams.  These Care Teams include your primary Cardiologist (physician) and Advanced Practice Providers (APPs -  Physician Assistants and Nurse Practitioners) who all work together to provide you with the care you need, when you need it.  We recommend signing up for the patient portal called "MyChart".  Sign up information is provided on this After Visit Summary.  MyChart is used to connect with patients for Virtual Visits (Telemedicine).  Patients are able to view lab/test results, encounter notes, upcoming appointments, etc.  Non-urgent messages can be sent to your provider as well.   To learn more about what you can do with MyChart, go to [email or web address].    Your next appointment:   6 month(s)  Provider:   K. Italy Hilty, MD or Eligha Bridegroom, NP    Other Instructions  Your physician wants you to follow-up in: 6 months.  You will receive a reminder letter in the mail two months in advance. If you don't receive a letter, please call our office to schedule the follow-up appointment.

## 2023-09-06 DIAGNOSIS — E1169 Type 2 diabetes mellitus with other specified complication: Secondary | ICD-10-CM | POA: Diagnosis not present

## 2023-09-06 DIAGNOSIS — Z7985 Long-term (current) use of injectable non-insulin antidiabetic drugs: Secondary | ICD-10-CM | POA: Diagnosis not present

## 2023-09-06 DIAGNOSIS — Z794 Long term (current) use of insulin: Secondary | ICD-10-CM | POA: Diagnosis not present

## 2023-09-06 DIAGNOSIS — Z7182 Exercise counseling: Secondary | ICD-10-CM | POA: Diagnosis not present

## 2023-09-06 DIAGNOSIS — G4733 Obstructive sleep apnea (adult) (pediatric): Secondary | ICD-10-CM | POA: Diagnosis not present

## 2023-09-06 DIAGNOSIS — E782 Mixed hyperlipidemia: Secondary | ICD-10-CM | POA: Diagnosis not present

## 2023-09-06 DIAGNOSIS — Z7984 Long term (current) use of oral hypoglycemic drugs: Secondary | ICD-10-CM | POA: Diagnosis not present

## 2023-09-06 DIAGNOSIS — Z23 Encounter for immunization: Secondary | ICD-10-CM | POA: Diagnosis not present

## 2023-09-06 DIAGNOSIS — E669 Obesity, unspecified: Secondary | ICD-10-CM | POA: Diagnosis not present

## 2023-09-06 DIAGNOSIS — I10 Essential (primary) hypertension: Secondary | ICD-10-CM | POA: Diagnosis not present

## 2023-09-06 DIAGNOSIS — Z713 Dietary counseling and surveillance: Secondary | ICD-10-CM | POA: Diagnosis not present

## 2023-09-14 DIAGNOSIS — M62830 Muscle spasm of back: Secondary | ICD-10-CM | POA: Diagnosis not present

## 2023-09-14 DIAGNOSIS — M9903 Segmental and somatic dysfunction of lumbar region: Secondary | ICD-10-CM | POA: Diagnosis not present

## 2023-09-14 DIAGNOSIS — M9905 Segmental and somatic dysfunction of pelvic region: Secondary | ICD-10-CM | POA: Diagnosis not present

## 2023-09-14 DIAGNOSIS — M9902 Segmental and somatic dysfunction of thoracic region: Secondary | ICD-10-CM | POA: Diagnosis not present

## 2023-09-14 DIAGNOSIS — M9901 Segmental and somatic dysfunction of cervical region: Secondary | ICD-10-CM | POA: Diagnosis not present

## 2023-09-14 DIAGNOSIS — M546 Pain in thoracic spine: Secondary | ICD-10-CM | POA: Diagnosis not present

## 2023-09-26 DIAGNOSIS — G4733 Obstructive sleep apnea (adult) (pediatric): Secondary | ICD-10-CM | POA: Diagnosis not present

## 2023-09-30 DIAGNOSIS — J302 Other seasonal allergic rhinitis: Secondary | ICD-10-CM | POA: Diagnosis not present

## 2023-09-30 DIAGNOSIS — J069 Acute upper respiratory infection, unspecified: Secondary | ICD-10-CM | POA: Diagnosis not present

## 2023-10-05 DIAGNOSIS — M9905 Segmental and somatic dysfunction of pelvic region: Secondary | ICD-10-CM | POA: Diagnosis not present

## 2023-10-05 DIAGNOSIS — M9902 Segmental and somatic dysfunction of thoracic region: Secondary | ICD-10-CM | POA: Diagnosis not present

## 2023-10-05 DIAGNOSIS — M9903 Segmental and somatic dysfunction of lumbar region: Secondary | ICD-10-CM | POA: Diagnosis not present

## 2023-10-05 DIAGNOSIS — M62830 Muscle spasm of back: Secondary | ICD-10-CM | POA: Diagnosis not present

## 2023-10-05 DIAGNOSIS — M546 Pain in thoracic spine: Secondary | ICD-10-CM | POA: Diagnosis not present

## 2023-10-05 DIAGNOSIS — M9901 Segmental and somatic dysfunction of cervical region: Secondary | ICD-10-CM | POA: Diagnosis not present

## 2023-10-19 DIAGNOSIS — M9905 Segmental and somatic dysfunction of pelvic region: Secondary | ICD-10-CM | POA: Diagnosis not present

## 2023-10-19 DIAGNOSIS — M9902 Segmental and somatic dysfunction of thoracic region: Secondary | ICD-10-CM | POA: Diagnosis not present

## 2023-10-19 DIAGNOSIS — M9901 Segmental and somatic dysfunction of cervical region: Secondary | ICD-10-CM | POA: Diagnosis not present

## 2023-10-19 DIAGNOSIS — M546 Pain in thoracic spine: Secondary | ICD-10-CM | POA: Diagnosis not present

## 2023-10-19 DIAGNOSIS — M62830 Muscle spasm of back: Secondary | ICD-10-CM | POA: Diagnosis not present

## 2023-10-19 DIAGNOSIS — M9903 Segmental and somatic dysfunction of lumbar region: Secondary | ICD-10-CM | POA: Diagnosis not present

## 2023-10-20 ENCOUNTER — Ambulatory Visit (HOSPITAL_BASED_OUTPATIENT_CLINIC_OR_DEPARTMENT_OTHER): Admission: RE | Admit: 2023-10-20 | Payer: PPO | Source: Ambulatory Visit

## 2023-10-24 ENCOUNTER — Ambulatory Visit (INDEPENDENT_AMBULATORY_CARE_PROVIDER_SITE_OTHER): Payer: PPO | Admitting: Urology

## 2023-10-24 ENCOUNTER — Encounter: Payer: Self-pay | Admitting: Urology

## 2023-10-24 VITALS — BP 138/76 | HR 101

## 2023-10-24 DIAGNOSIS — R339 Retention of urine, unspecified: Secondary | ICD-10-CM

## 2023-10-24 DIAGNOSIS — N138 Other obstructive and reflux uropathy: Secondary | ICD-10-CM | POA: Diagnosis not present

## 2023-10-24 DIAGNOSIS — N401 Enlarged prostate with lower urinary tract symptoms: Secondary | ICD-10-CM

## 2023-10-24 LAB — URINALYSIS, ROUTINE W REFLEX MICROSCOPIC
Bilirubin, UA: NEGATIVE
Leukocytes,UA: NEGATIVE
Nitrite, UA: NEGATIVE
Protein,UA: NEGATIVE
RBC, UA: NEGATIVE
Specific Gravity, UA: 1.015 (ref 1.005–1.030)
Urobilinogen, Ur: 0.2 mg/dL (ref 0.2–1.0)
pH, UA: 6 (ref 5.0–7.5)

## 2023-10-24 LAB — BLADDER SCAN AMB NON-IMAGING: Scan Result: 200

## 2023-10-24 MED ORDER — SILODOSIN 8 MG PO CAPS: 8.0000 mg | ORAL_CAPSULE | Freq: Two times a day (BID) | ORAL | 3 refills | Status: DC

## 2023-10-24 MED ORDER — FINASTERIDE 5 MG PO TABS: 5.0000 mg | ORAL_TABLET | Freq: Every day | ORAL | 3 refills | Status: DC

## 2023-10-24 NOTE — Patient Instructions (Signed)

## 2023-10-24 NOTE — Progress Notes (Signed)
10/24/2023 2:48 PM   Roger Guerra Jan 19, 1956 [id number]  Referring provider: Bennie Pierini, FNP [address] [place],  [place] [address]  Followup BPH    HPI: Roger Guerra is a 67yo here for followup for BPH and incomplete emptying. IPSS 10 QOL 3 on rapaflo 8mg  BID and finasteride. PVR 200cc. Urine stream intermittently strong.  Nocturia 1x. He is having bothersome post void dribbling.   PMH: Past Medical History:  Diagnosis Date   Anxiety disorder    Arthritis    Atherosclerosis    Back pain    Cervical radiculopathy    Constipation    Coronary artery calcification seen on CT scan    Depression    Diabetes (HCC)    Erectile dysfunction    GERD (gastroesophageal reflux disease)    Heartburn    History of blood clots    History of DVT (deep vein thrombosis)    History of DVT of lower extremity    Right leg 2002    History of pneumonia    Hyperlipidemia    Hypogonadism in male    Hypopituitarism (HCC)    Joint pain    Nerve root and plexus disorder    Obesity    Obstructive sleep apnea    OSA on CPAP    Osteoarthritis    Sciatica    Sleep apnea    SOBOE (shortness of breath on exertion)    Spinal stenosis    Spondylosis    Swelling of both lower extremities    Type 2 diabetes mellitus (HCC)    Vitamin D deficiency     Surgical History: Past Surgical History:  Procedure Laterality Date   BLADDER SURGERY  2015   removal of skin tag near urethra   COLONOSCOPY N/A 12/10/2016   Procedure: COLONOSCOPY;  Surgeon: Corbin Ade, MD;  Location: [facility];  Service: Endoscopy;  Laterality: N/A;  8:30 AM   COLONOSCOPY WITH PROPOFOL N/A 04/29/2022   Procedure: COLONOSCOPY WITH PROPOFOL;  Surgeon: Corbin Ade, MD;  Location: [facility];  Service: Endoscopy;  Laterality: N/A;  8:00am   KNEE ARTHROSCOPY WITH MEDIAL MENISECTOMY Right 08/25/2017   Procedure: Right knee arthroscopy, partial medial menisectomy and debridement;  Surgeon: Jene Every, MD;  Location: [facility];  Service: Orthopedics;  Laterality: Right;  60 mins   POLYPECTOMY  12/10/2016   Procedure: POLYPECTOMY;  Surgeon: Corbin Ade, MD;  Location: [facility];  Service: Endoscopy;;  hepatic flexure   PTOSIS REPAIR Bilateral 02/2022   TONSILLECTOMY     TOTAL KNEE ARTHROPLASTY Left 03/02/2023   Procedure: TOTAL KNEE ARTHROPLASTY;  Surgeon: Jene Every, MD;  Location: [facility];  Service: Orthopedics;  Laterality: Left;   WISDOM TOOTH EXTRACTION      Home Medications:  Allergies as of 10/24/2023       Reactions   Invokana [canagliflozin] Other (See Comments)   Yeast infection   Statins Other (See Comments)   Severe muscle cramps        Medication List        Accurate as of October 24, 2023  2:48 PM. If you have any questions, ask your nurse or doctor.          5-HTP PO Take 150 mg by mouth daily.   AMBULATORY NON FORMULARY MEDICATION Medication Name: Trimix  PGE 30 mcg Pap 30 mg Phent 1 mg What changed:  when to take this reasons to take this   cetirizine 10 MG tablet  Commonly known as: ZYRTEC Take 10 mg by mouth every evening.   COENZYME Q10 PO Take 200 mg by mouth daily.   ezetimibe 10 MG tablet Commonly known as: ZETIA Take 1 tablet (10 mg total) by mouth daily.   fexofenadine 180 MG tablet Commonly known as: ALLEGRA Take 180 mg by mouth every morning.   finasteride 5 MG tablet Commonly known as: PROSCAR Take 1 tablet (5 mg total) by mouth daily.   glipiZIDE 10 MG tablet Commonly known as: GLUCOTROL Take 1 tablet (10 mg total) by mouth 2 (two) times daily before a meal. **NEEDS TO BE SEEN BEFORE NEXT REFILL**   losartan 25 MG tablet Commonly known as: COZAAR Take 25 mg by mouth daily.   Luer Lock Safety Syringes 22G X 1" 3 ML Misc Generic drug: SYRINGE-NEEDLE (DISP) 3 ML For testosterone  injections   MAGNESIUM MALATE PO Take 1,000 mg by mouth daily.   methocarbamol 500 MG tablet Commonly known as:  ROBAXIN Take 1 tablet (500 mg total) by mouth every 8 (eight) hours as needed for muscle spasms.   multivitamin with minerals Tabs tablet Take 1 tablet by mouth daily.   omeprazole 20 MG tablet Commonly known as: PRILOSEC OTC Take 1 tablet (20 mg total) by mouth daily as needed (acid reflux).   ondansetron 4 MG tablet Commonly known as: Zofran Take 1 tablet (4 mg total) by mouth every 8 (eight) hours as needed for nausea or vomiting.   OneTouch Ultra test strip Generic drug: glucose blood check blood sugar once daily. DX:E11.69   Pen Needles 31G X 6 MM Misc 1 each by Does not apply route daily.   Repatha SureClick 140 MG/ML Soaj Generic drug: Evolocumab Inject 140 mg into the skin every 14 (fourteen) days.   silodosin 8 MG Caps capsule Commonly known as: RAPAFLO Take 1 capsule (8 mg total) by mouth in the morning and at bedtime.   sodium chloride 0.65 % Soln nasal spray Commonly known as: OCEAN Place 1 spray into both nostrils 2 (two) times daily.   testosterone cypionate 200 MG/ML injection Commonly known as: DEPOTESTOSTERONE CYPIONATE Inject 100 mg into the muscle every 14 (fourteen) days. For IM use only   Trulicity 3 MG/0.5ML Soaj Generic drug: Dulaglutide Inject 3 mg as directed once a week. **NEEDS TO BE SEEN BEFORE NEXT REFILL**   Vitamin D3 125 MCG (5000 UT) Caps Take 5,000 Units by mouth daily.   zinc gluconate 50 MG tablet Take 50 mg by mouth daily.        Allergies:  Allergies  Allergen Reactions   Invokana [Canagliflozin] Other (See Comments)    Yeast infection   Statins Other (See Comments)    Severe muscle cramps    Family History: Family History  Problem Relation Age of Onset   Diabetes Mother    Lung cancer Father    Heart disease Father    Stroke Father    Cancer Father    Diabetes Sister    Diabetes Other        Siblings    Social History:  reports that he has quit smoking. His smoking use included cigarettes. He has a 37.5  pack-year smoking history. He has never used smokeless tobacco. He reports that he does not drink alcohol and does not use drugs.  ROS: All other review of systems were reviewed and are negative except what is noted above in HPI  Physical Exam: BP 138/76   Pulse (!) 101   Constitutional:  Alert and oriented, No acute distress. HEENT: [place] AT, moist mucus membranes.  Trachea midline, no masses. Cardiovascular: No clubbing, cyanosis, or edema. Respiratory: Normal respiratory effort, no increased work of breathing. GI: Abdomen is soft, nontender, nondistended, no abdominal masses GU: No CVA tenderness.  Lymph: No cervical or inguinal lymphadenopathy. Skin: No rashes, bruises or suspicious lesions. Neurologic: Grossly intact, no focal deficits, moving all 4 extremities. Psychiatric: Normal mood and affect.  Laboratory Data: Lab Results  Component Value Date   WBC 17.2 (H) 03/04/2023   HGB 13.9 03/04/2023   HCT 40.3 03/04/2023   MCV 90.0 03/04/2023   PLT 193 03/04/2023    Lab Results  Component Value Date   CREATININE 0.92 03/05/2023    No results found for: "PSA"  Lab Results  Component Value Date   TESTOSTERONE 592 06/16/2021    Lab Results  Component Value Date   HGBA1C 7.5 (H) 02/23/2023    Urinalysis    Component Value Date/Time   APPEARANCEUR Clear 04/22/2023 1244   GLUCOSEU Negative 04/22/2023 1244   BILIRUBINUR Negative 04/22/2023 1244   PROTEINUR Negative 04/22/2023 1244   NITRITE Negative 04/22/2023 1244   LEUKOCYTESUR Negative 04/22/2023 1244    Lab Results  Component Value Date   LABMICR Comment 04/22/2023    Pertinent Imaging:  No results found for this or any previous visit.  No results found for this or any previous visit.  No results found for this or any previous visit.  No results found for this or any previous visit.  No results found for this or any previous visit.  No results found for this or any previous visit.  No results  found for this or any previous visit.  No results found for this or any previous visit.   Assessment & Plan:    1. Incomplete bladder emptying -continue rapaflo 8mg  BID and finasteride - BLADDER SCAN AMB NON-IMAGING  2. Benign prostatic hyperplasia with urinary obstruction (Primary) We discussed the management of his BPH including continued medical therapy, Rezum, Urolift, TURP and simple prostatectomy. After discussing the options the patient has elected to proceed with urolift. Risks/benefits/alternatives discussed.  - Urinalysis, Routine w reflex microscopic   No follow-ups on file.  Wilkie Aye, MD  [facility] [place]

## 2023-10-24 NOTE — Progress Notes (Signed)
post void residual=200

## 2023-10-27 ENCOUNTER — Telehealth: Payer: Self-pay

## 2023-10-27 NOTE — Telephone Encounter (Signed)
  finasteride (PROSCAR) 5 MG tablet   silodosin silodosin (RAPAFLO) 8 MG CAPS capsule

## 2023-10-28 ENCOUNTER — Encounter: Payer: Self-pay | Admitting: Urology

## 2023-11-02 DIAGNOSIS — M9901 Segmental and somatic dysfunction of cervical region: Secondary | ICD-10-CM | POA: Diagnosis not present

## 2023-11-02 DIAGNOSIS — M9903 Segmental and somatic dysfunction of lumbar region: Secondary | ICD-10-CM | POA: Diagnosis not present

## 2023-11-02 DIAGNOSIS — M546 Pain in thoracic spine: Secondary | ICD-10-CM | POA: Diagnosis not present

## 2023-11-02 DIAGNOSIS — M9902 Segmental and somatic dysfunction of thoracic region: Secondary | ICD-10-CM | POA: Diagnosis not present

## 2023-11-02 DIAGNOSIS — M62830 Muscle spasm of back: Secondary | ICD-10-CM | POA: Diagnosis not present

## 2023-11-02 DIAGNOSIS — M9905 Segmental and somatic dysfunction of pelvic region: Secondary | ICD-10-CM | POA: Diagnosis not present

## 2023-11-10 ENCOUNTER — Ambulatory Visit (HOSPITAL_BASED_OUTPATIENT_CLINIC_OR_DEPARTMENT_OTHER)
Admission: RE | Admit: 2023-11-10 | Discharge: 2023-11-10 | Disposition: A | Discharge status: Home / Self Care | Payer: Self-pay | Source: Ambulatory Visit | Attending: Nurse Practitioner | Admitting: Nurse Practitioner

## 2023-11-10 DIAGNOSIS — E785 Hyperlipidemia, unspecified: Secondary | ICD-10-CM | POA: Insufficient documentation

## 2023-11-10 DIAGNOSIS — I2510 Atherosclerotic heart disease of native coronary artery without angina pectoris: Secondary | ICD-10-CM | POA: Insufficient documentation

## 2023-11-10 DIAGNOSIS — E1169 Type 2 diabetes mellitus with other specified complication: Secondary | ICD-10-CM | POA: Insufficient documentation

## 2023-11-12 ENCOUNTER — Encounter (HOSPITAL_BASED_OUTPATIENT_CLINIC_OR_DEPARTMENT_OTHER): Payer: Self-pay

## 2023-11-18 DIAGNOSIS — M9905 Segmental and somatic dysfunction of pelvic region: Secondary | ICD-10-CM | POA: Diagnosis not present

## 2023-11-18 DIAGNOSIS — M62830 Muscle spasm of back: Secondary | ICD-10-CM | POA: Diagnosis not present

## 2023-11-18 DIAGNOSIS — M9901 Segmental and somatic dysfunction of cervical region: Secondary | ICD-10-CM | POA: Diagnosis not present

## 2023-11-18 DIAGNOSIS — M9902 Segmental and somatic dysfunction of thoracic region: Secondary | ICD-10-CM | POA: Diagnosis not present

## 2023-11-18 DIAGNOSIS — M9903 Segmental and somatic dysfunction of lumbar region: Secondary | ICD-10-CM | POA: Diagnosis not present

## 2023-11-18 DIAGNOSIS — M546 Pain in thoracic spine: Secondary | ICD-10-CM | POA: Diagnosis not present

## 2023-11-24 DIAGNOSIS — E559 Vitamin D deficiency, unspecified: Secondary | ICD-10-CM | POA: Diagnosis not present

## 2023-11-24 DIAGNOSIS — I10 Essential (primary) hypertension: Secondary | ICD-10-CM | POA: Diagnosis not present

## 2023-11-24 DIAGNOSIS — E1169 Type 2 diabetes mellitus with other specified complication: Secondary | ICD-10-CM | POA: Diagnosis not present

## 2023-11-30 DIAGNOSIS — M9902 Segmental and somatic dysfunction of thoracic region: Secondary | ICD-10-CM | POA: Diagnosis not present

## 2023-11-30 DIAGNOSIS — M9903 Segmental and somatic dysfunction of lumbar region: Secondary | ICD-10-CM | POA: Diagnosis not present

## 2023-11-30 DIAGNOSIS — N401 Enlarged prostate with lower urinary tract symptoms: Secondary | ICD-10-CM | POA: Diagnosis not present

## 2023-11-30 DIAGNOSIS — E669 Obesity, unspecified: Secondary | ICD-10-CM | POA: Diagnosis not present

## 2023-11-30 DIAGNOSIS — M170 Bilateral primary osteoarthritis of knee: Secondary | ICD-10-CM | POA: Diagnosis not present

## 2023-11-30 DIAGNOSIS — M546 Pain in thoracic spine: Secondary | ICD-10-CM | POA: Diagnosis not present

## 2023-11-30 DIAGNOSIS — E1169 Type 2 diabetes mellitus with other specified complication: Secondary | ICD-10-CM | POA: Diagnosis not present

## 2023-11-30 DIAGNOSIS — M9901 Segmental and somatic dysfunction of cervical region: Secondary | ICD-10-CM | POA: Diagnosis not present

## 2023-11-30 DIAGNOSIS — R1310 Dysphagia, unspecified: Secondary | ICD-10-CM | POA: Diagnosis not present

## 2023-11-30 DIAGNOSIS — M62830 Muscle spasm of back: Secondary | ICD-10-CM | POA: Diagnosis not present

## 2023-11-30 DIAGNOSIS — G4733 Obstructive sleep apnea (adult) (pediatric): Secondary | ICD-10-CM | POA: Diagnosis not present

## 2023-11-30 DIAGNOSIS — M9905 Segmental and somatic dysfunction of pelvic region: Secondary | ICD-10-CM | POA: Diagnosis not present

## 2023-11-30 DIAGNOSIS — E559 Vitamin D deficiency, unspecified: Secondary | ICD-10-CM | POA: Diagnosis not present

## 2023-11-30 DIAGNOSIS — K219 Gastro-esophageal reflux disease without esophagitis: Secondary | ICD-10-CM | POA: Diagnosis not present

## 2023-11-30 DIAGNOSIS — E782 Mixed hyperlipidemia: Secondary | ICD-10-CM | POA: Diagnosis not present

## 2023-11-30 DIAGNOSIS — E1159 Type 2 diabetes mellitus with other circulatory complications: Secondary | ICD-10-CM | POA: Diagnosis not present

## 2023-11-30 DIAGNOSIS — M5030 Other cervical disc degeneration, unspecified cervical region: Secondary | ICD-10-CM | POA: Diagnosis not present

## 2023-11-30 DIAGNOSIS — I10 Essential (primary) hypertension: Secondary | ICD-10-CM | POA: Diagnosis not present

## 2023-12-07 NOTE — Patient Instructions (Signed)
 Roger Guerra  12/07/2023     @PREFPERIOPPHARMACY @   Your procedure is scheduled on  12/12/2023.   Report to [facility] at  0600  A.M.   Call this number if you have problems the morning of surgery:  [phone]  If you experience any cold or flu symptoms such as cough, fever, chills, shortness of breath, etc. between now and your scheduled surgery, please notify us at the above number.   Remember:        Your last dose of trulicity should be on 12/04/2023.        Take 1/2 of your usual insulin dosage the night before your procedure.        DO NOT take any medications for diabetes the morning of your procedure.   Do not eat after midnight.   You may drink clear liquids until 0330 am on 12/12/2023.    Clear liquids allowed are:                    Water, Juice (No red color; non-citric and without pulp; diabetics please choose diet or no sugar options), Carbonated beverages (diabetics please choose diet or no sugar options), Clear Tea (No creamer, milk, or cream, including half & half and powdered creamer), Black Coffee Only (No creamer, milk or cream, including half & half and powdered creamer), and Clear Sports drink (No red color; diabetics please choose diet or no sugar options)    Take these medicines the morning of surgery with A SIP OF WATER                                        finasteride, silodosin.    Do not wear jewelry, make-up or nail polish, including gel polish,  artificial nails, or any other type of covering on natural nails (fingers and  toes).  Do not wear lotions, powders, or perfumes, or deodorant.  Do not shave 48 hours prior to surgery.  Men may shave face and neck.  Do not bring valuables to the hospital.  [facility] is not responsible for any belongings or valuables.  Contacts, dentures or bridgework may not be worn into surgery.  Leave your suitcase in the car.  After surgery it may be brought to your room.  For patients admitted to the  hospital, discharge time will be determined by your treatment team.  Patients discharged the day of surgery will not be allowed to drive home and must have someone with them for 24 hours.    Special instructions:   DO NOT smoke tobacco or vape for 24 hours before your procedure.  Please read over the following fact sheets that you were given. Coughing and Deep Breathing, Anesthesia Post-op Instructions, and Care and Recovery After Surgery        Prostatic Urethral Lift, Care After The following information offers guidance on how to care for yourself after your procedure. Your health care provider may also give you more specific instructions. If you have problems or questions, contact your health care provider. What can I expect after the procedure? After the procedure, it is common to have: Soreness or discomfort in your penis from having the cystoscope inserted during the procedure. Discomfort or burning when urinating. An increased urge to urinate. More frequent urination. Urine that is blood-tinged. These symptoms should go away after a few days.  Follow these instructions at home: Activity  If you were given a sedative during the procedure, it can affect you for several hours. Do not drive or operate machinery until your health care provider says that it is safe. Avoid sitting for a long time without moving. Get up to take short walks every 1-2 hours. This is important to improve blood flow and breathing. Ask for help if you feel weak or unsteady. You may have to avoid lifting. Ask your health care provider how much you can safely lift. Avoid intense physical activity for as long as told by your health care provider. Return to your normal activities as told by your health care provider. Ask your health care provider what activities are safe for you. Ask when you can return to sexual activity. General instructions  Take over-the-counter and prescription medicines only as told by  your health care provider. Ask your health care provider if the medicine prescribed to you: Requires you to avoid driving or using machinery. Can cause constipation. You may need to take these actions to prevent or treat constipation: Drink enough fluid to keep your urine pale yellow. Take over-the-counter or prescription medicines. Eat foods that are high in fiber, such as beans, whole grains, and fresh fruits and vegetables. Limit foods that are high in fat and processed sugars, such as fried or sweet foods. Do not use any products that contain nicotine or tobacco. These products include cigarettes, chewing tobacco, and vaping devices, such as e-cigarettes. These can delay healing after the procedure. If you need help quitting, ask your health care provider. Keep all follow-up visits. This is important. Contact a health care provider if: You have chills or a fever. You have pain when passing urine. You have bright red blood or blood clots in your urine. You have difficulty passing urine. You have leaking of urine (incontinence). Get help right away if: You have chest pain or shortness of breath. You have leg pain or swelling. You cannot pass urine. These symptoms may be an emergency. Get help right away. Call 911. Do not wait to see if the symptoms will go away. Do not drive yourself to the hospital. Summary After the procedure, it is common to have discomfort or burning when urinating, an increased urge to urinate, more frequent urination, and urine that is blood-tinged. You may have to avoid lifting. Ask your health care provider how much you can safely lift. Return to your normal activities as told by your health care provider. Ask when you can return to sexual activity. This information is not intended to replace advice given to you by your health care provider. Make sure you discuss any questions you have with your health care provider. Document Revised: 04/10/2021 Document  Reviewed: 04/10/2021 Elsevier Patient Education  2024 [employer].General Anesthesia, Adult, Care After The following information offers guidance on how to care for yourself after your procedure. Your health care provider may also give you more specific instructions. If you have problems or questions, contact your health care provider. What can I expect after the procedure? After the procedure, it is common for people to: Have pain or discomfort at the IV site. Have nausea or vomiting. Have a sore throat or hoarseness. Have trouble concentrating. Feel cold or chills. Feel weak, sleepy, or tired (fatigue). Have soreness and body aches. These can affect parts of the body that were not involved in surgery. Follow these instructions at home: For the time period you were told by your health  care provider:  Rest. Do not participate in activities where you could fall or become injured. Do not drive or use machinery. Do not drink alcohol. Do not take sleeping pills or medicines that cause drowsiness. Do not make important decisions or sign legal documents. Do not take care of children on your own. General instructions Drink enough fluid to keep your urine pale yellow. If you have sleep apnea, surgery and certain medicines can increase your risk for breathing problems. Follow instructions from your health care provider about wearing your sleep device: Anytime you are sleeping, including during daytime naps. While taking prescription pain medicines, sleeping medicines, or medicines that make you drowsy. Return to your normal activities as told by your health care provider. Ask your health care provider what activities are safe for you. Take over-the-counter and prescription medicines only as told by your health care provider. Do not use any products that contain nicotine or tobacco. These products include cigarettes, chewing tobacco, and vaping devices, such as e-cigarettes. These can delay  incision healing after surgery. If you need help quitting, ask your health care provider. Contact a health care provider if: You have nausea or vomiting that does not get better with medicine. You vomit every time you eat or drink. You have pain that does not get better with medicine. You cannot urinate or have bloody urine. You develop a skin rash. You have a fever. Get help right away if: You have trouble breathing. You have chest pain. You vomit blood. These symptoms may be an emergency. Get help right away. Call 911. Do not wait to see if the symptoms will go away. Do not drive yourself to the hospital. Summary After the procedure, it is common to have a sore throat, hoarseness, nausea, vomiting, or to feel weak, sleepy, or fatigue. For the time period you were told by your health care provider, do not drive or use machinery. Get help right away if you have difficulty breathing, have chest pain, or vomit blood. These symptoms may be an emergency. This information is not intended to replace advice given to you by your health care provider. Make sure you discuss any questions you have with your health care provider. Document Revised: 12/11/2021 Document Reviewed: 12/11/2021 Elsevier Patient Education  2024 Else[employer] Inc.How to Use Chlorhexidine at Home in the Shower Chlorhexidine gluconate (CHG) is a germ-killing (antiseptic) wash that's used to clean the skin. It can get rid of the germs that normally live on the skin and can keep them away for about 24 hours. If you're having surgery, you may be told to shower with CHG at home the night before surgery. This can help lower your risk for infection. To use CHG wash in the shower, follow the steps below. Supplies needed: CHG body wash. Clean washcloth. Clean towel. How to use CHG in the shower Follow these steps unless you're told to use CHG in a different way: Start the shower. Use your normal soap and shampoo to wash your face and  hair. Turn off the shower or move out of the shower stream. Pour CHG onto a clean washcloth. Do not use any type of brush or rough sponge. Start at your neck, washing your body down to your toes. Make sure you: Wash the part of your body where the surgery will be done for at least 1 minute. Do not scrub. Do not use CHG on your head or face unless your health care provider tells you to. If it gets into your  ears or eyes, rinse them well with water. Do not wash your genitals with CHG. Wash your back and under your arms. Make sure to wash skin folds. Let the CHG sit on your skin for 1-2 minutes or as long as told. Rinse your entire body in the shower, including all body creases and folds. Turn off the shower. Dry off with a clean towel. Do not put anything on your skin afterward, such as powder, lotion, or perfume. Put on clean clothes or pajamas. If it's the night before surgery, sleep in clean sheets. General tips Use CHG only as told, and follow the instructions on the label. Use the full amount of CHG as told. This is often one bottle. Do not smoke and stay away from flames after using CHG. Your skin may feel sticky after using CHG. This is normal. The sticky feeling will go away as the CHG dries. Do not use CHG: If you have a chlorhexidine allergy or have reacted to chlorhexidine in the past. On open wounds or areas of skin that have broken skin, cuts, or scrapes. On babies younger than 28 months of age. Contact a health care provider if: You have questions about using CHG. Your skin gets irritated or itchy. You have a rash after using CHG. You swallow any CHG. Call your local poison control center [phone] in the [place] Your eyes itch badly, or they become very red or swollen. Your hearing changes. You have trouble seeing. If you can't reach your provider, go to an urgent care or emergency room. Do not drive yourself. Get help right away if: You have swelling or tingling in  your mouth or throat. You make high-pitched whistling sounds when you breathe, most often when you breathe out (wheeze). You have trouble breathing. These symptoms may be an emergency. Call 911 right away. Do not wait to see if the symptoms will go away. Do not drive yourself to the hospital. This information is not intended to replace advice given to you by your health care provider. Make sure you discuss any questions you have with your health care provider. Document Revised: 03/29/2023 Document Reviewed: 03/25/2022 Elsevier Patient Education  2024 [employer].

## 2023-12-08 ENCOUNTER — Other Ambulatory Visit: Payer: Self-pay

## 2023-12-08 ENCOUNTER — Encounter (HOSPITAL_COMMUNITY)
Admission: RE | Admit: 2023-12-08 | Discharge: 2023-12-08 | Disposition: A | Discharge status: Home / Self Care | Payer: PPO | Source: Ambulatory Visit | Attending: Urology | Admitting: Urology

## 2023-12-08 ENCOUNTER — Encounter (HOSPITAL_COMMUNITY): Payer: Self-pay

## 2023-12-08 DIAGNOSIS — Z01812 Encounter for preprocedural laboratory examination: Secondary | ICD-10-CM | POA: Insufficient documentation

## 2023-12-08 DIAGNOSIS — E1165 Type 2 diabetes mellitus with hyperglycemia: Secondary | ICD-10-CM | POA: Diagnosis not present

## 2023-12-08 HISTORY — DX: Acute myocardial infarction, unspecified: I21.9

## 2023-12-08 LAB — BASIC METABOLIC PANEL
Anion gap: 10 (ref 5–15)
BUN: 9 mg/dL (ref 8–23)
CO2: 22 mmol/L (ref 22–32)
Calcium: 9 mg/dL (ref 8.9–10.3)
Chloride: 104 mmol/L (ref 98–111)
Creatinine, Ser: 0.8 mg/dL (ref 0.61–1.24)
GFR, Estimated: >60 mL/min (ref >60)
Glucose, Bld: 151 mg/dL — ABNORMAL HIGH (ref 70–99)
Potassium: 3.9 mmol/L (ref 3.5–5.1)
Sodium: 136 mmol/L (ref 135–145)

## 2023-12-08 LAB — HEMOGLOBIN A1C
Hgb A1c MFr Bld: 8 % — ABNORMAL HIGH (ref 4.8–5.6)
Mean Plasma Glucose: 182.9 mg/dL

## 2023-12-12 ENCOUNTER — Ambulatory Visit (HOSPITAL_COMMUNITY)
Admission: RE | Admit: 2023-12-12 | Discharge: 2023-12-12 | Disposition: A | Discharge status: Home / Self Care | Payer: PPO | Attending: Urology | Admitting: Urology

## 2023-12-12 ENCOUNTER — Ambulatory Visit (HOSPITAL_COMMUNITY): Payer: Self-pay | Admitting: Anesthesiology

## 2023-12-12 ENCOUNTER — Ambulatory Visit (HOSPITAL_BASED_OUTPATIENT_CLINIC_OR_DEPARTMENT_OTHER): Payer: Self-pay | Admitting: Anesthesiology

## 2023-12-12 ENCOUNTER — Encounter (HOSPITAL_COMMUNITY)
Admission: RE | Disposition: A | Discharge status: Home / Self Care | Payer: Self-pay | Source: Home / Self Care | Attending: Urology

## 2023-12-12 DIAGNOSIS — M4800 Spinal stenosis, site unspecified: Secondary | ICD-10-CM | POA: Insufficient documentation

## 2023-12-12 DIAGNOSIS — I252 Old myocardial infarction: Secondary | ICD-10-CM | POA: Insufficient documentation

## 2023-12-12 DIAGNOSIS — G4733 Obstructive sleep apnea (adult) (pediatric): Secondary | ICD-10-CM | POA: Diagnosis not present

## 2023-12-12 DIAGNOSIS — K219 Gastro-esophageal reflux disease without esophagitis: Secondary | ICD-10-CM | POA: Insufficient documentation

## 2023-12-12 DIAGNOSIS — E119 Type 2 diabetes mellitus without complications: Secondary | ICD-10-CM | POA: Insufficient documentation

## 2023-12-12 DIAGNOSIS — Z87891 Personal history of nicotine dependence: Secondary | ICD-10-CM

## 2023-12-12 DIAGNOSIS — N401 Enlarged prostate with lower urinary tract symptoms: Secondary | ICD-10-CM

## 2023-12-12 DIAGNOSIS — G709 Myoneural disorder, unspecified: Secondary | ICD-10-CM | POA: Insufficient documentation

## 2023-12-12 DIAGNOSIS — I2510 Atherosclerotic heart disease of native coronary artery without angina pectoris: Secondary | ICD-10-CM | POA: Insufficient documentation

## 2023-12-12 DIAGNOSIS — Z7984 Long term (current) use of oral hypoglycemic drugs: Secondary | ICD-10-CM | POA: Diagnosis not present

## 2023-12-12 DIAGNOSIS — N138 Other obstructive and reflux uropathy: Secondary | ICD-10-CM | POA: Diagnosis not present

## 2023-12-12 DIAGNOSIS — Z7985 Long-term (current) use of injectable non-insulin antidiabetic drugs: Secondary | ICD-10-CM | POA: Insufficient documentation

## 2023-12-12 DIAGNOSIS — Z8249 Family history of ischemic heart disease and other diseases of the circulatory system: Secondary | ICD-10-CM | POA: Diagnosis not present

## 2023-12-12 DIAGNOSIS — Z79899 Other long term (current) drug therapy: Secondary | ICD-10-CM | POA: Diagnosis not present

## 2023-12-12 DIAGNOSIS — Z833 Family history of diabetes mellitus: Secondary | ICD-10-CM | POA: Diagnosis not present

## 2023-12-12 HISTORY — PX: CYSTOSCOPY WITH INSERTION OF UROLIFT: SHX6678

## 2023-12-12 LAB — GLUCOSE, CAPILLARY: Glucose-Capillary: 156 mg/dL — ABNORMAL HIGH (ref 70–99)

## 2023-12-12 SURGERY — CYSTOSCOPY WITH INSERTION OF UROLIFT
Anesthesia: General | Site: Prostate

## 2023-12-12 MED ORDER — PHENYLEPHRINE 80 MCG/ML (10ML) SYRINGE FOR IV PUSH (FOR BLOOD PRESSURE SUPPORT)
PREFILLED_SYRINGE | INTRAVENOUS | Status: DC | PRN
  Administered 2023-12-12: 160 ug via INTRAVENOUS

## 2023-12-12 MED ORDER — ORAL CARE MOUTH RINSE: 15.0000 mL | Freq: Once | OROMUCOSAL | Status: DC

## 2023-12-12 MED ORDER — OXYCODONE HCL 5 MG PO TABS: 5.0000 mg | ORAL_TABLET | Freq: Once | ORAL | Status: DC | PRN

## 2023-12-12 MED ORDER — TRAMADOL HCL 50 MG PO TABS: 50.0000 mg | ORAL_TABLET | Freq: Four times a day (QID) | ORAL | 0 refills | Status: DC | PRN

## 2023-12-12 MED ORDER — ONDANSETRON HCL 4 MG/2ML IJ SOLN: 4.0000 mg | Freq: Once | INTRAMUSCULAR | Status: DC | PRN

## 2023-12-12 MED ORDER — GLYCOPYRROLATE PF 0.2 MG/ML IJ SOSY
PREFILLED_SYRINGE | INTRAMUSCULAR | Status: DC | PRN
  Administered 2023-12-12 (×2): .1 mg via INTRAVENOUS

## 2023-12-12 MED ORDER — ONDANSETRON HCL 4 MG/2ML IJ SOLN
INTRAMUSCULAR | Status: AC
  Filled 2023-12-12: qty 2

## 2023-12-12 MED ORDER — FENTANYL CITRATE (PF) 100 MCG/2ML IJ SOLN
INTRAMUSCULAR | Status: AC
  Filled 2023-12-12: qty 2

## 2023-12-12 MED ORDER — FENTANYL CITRATE (PF) 100 MCG/2ML IJ SOLN
INTRAMUSCULAR | Status: DC | PRN
  Administered 2023-12-12 (×2): 50 ug via INTRAVENOUS

## 2023-12-12 MED ORDER — CEFAZOLIN SODIUM-DEXTROSE 1-4 GM/50ML-% IV SOLN
INTRAVENOUS | Status: DC | PRN
  Administered 2023-12-12: 1 g via INTRAVENOUS

## 2023-12-12 MED ORDER — CHLORHEXIDINE GLUCONATE 0.12 % MT SOLN
OROMUCOSAL | Status: AC
  Filled 2023-12-12: qty 15

## 2023-12-12 MED ORDER — FENTANYL CITRATE PF 50 MCG/ML IJ SOSY
25.0000 ug | PREFILLED_SYRINGE | INTRAMUSCULAR | Status: DC | PRN
  Administered 2023-12-12: 50 ug via INTRAVENOUS
  Filled 2023-12-12: qty 1

## 2023-12-12 MED ORDER — CEFAZOLIN SODIUM-DEXTROSE 1-4 GM/50ML-% IV SOLN
INTRAVENOUS | Status: AC
  Filled 2023-12-12: qty 50

## 2023-12-12 MED ORDER — OXYCODONE HCL 5 MG/5ML PO SOLN: 5.0000 mg | Freq: Once | ORAL | Status: DC | PRN

## 2023-12-12 MED ORDER — MIDAZOLAM HCL 2 MG/2ML IJ SOLN
INTRAMUSCULAR | Status: AC
  Filled 2023-12-12: qty 2

## 2023-12-12 MED ORDER — LIDOCAINE HCL (PF) 2 % IJ SOLN
INTRAMUSCULAR | Status: AC
  Filled 2023-12-12: qty 5

## 2023-12-12 MED ORDER — GLYCOPYRROLATE PF 0.2 MG/ML IJ SOSY
PREFILLED_SYRINGE | INTRAMUSCULAR | Status: AC
  Filled 2023-12-12: qty 1

## 2023-12-12 MED ORDER — WATER FOR IRRIGATION, STERILE IR SOLN
Status: DC | PRN
  Administered 2023-12-12: 1000 mL

## 2023-12-12 MED ORDER — PROPOFOL 10 MG/ML IV BOLUS
INTRAVENOUS | Status: AC
  Filled 2023-12-12: qty 20

## 2023-12-12 MED ORDER — CHLORHEXIDINE GLUCONATE 0.12 % MT SOLN: 15.0000 mL | Freq: Once | OROMUCOSAL | Status: DC

## 2023-12-12 MED ORDER — LACTATED RINGERS IV SOLN: INTRAVENOUS | Status: DC

## 2023-12-12 MED ORDER — PROPOFOL 10 MG/ML IV BOLUS
INTRAVENOUS | Status: DC | PRN
Start: 2023-12-12 — End: 2023-12-12
  Administered 2023-12-12: 150 mg via INTRAVENOUS
  Administered 2023-12-12: 50 mg via INTRAVENOUS

## 2023-12-12 MED ORDER — LIDOCAINE HCL (PF) 2 % IJ SOLN
INTRAMUSCULAR | Status: DC | PRN
  Administered 2023-12-12: 100 mg via INTRADERMAL

## 2023-12-12 MED ORDER — ONDANSETRON HCL 4 MG/2ML IJ SOLN
INTRAMUSCULAR | Status: DC | PRN
  Administered 2023-12-12: 4 mg via INTRAVENOUS

## 2023-12-12 MED ORDER — MIDAZOLAM HCL 2 MG/2ML IJ SOLN
INTRAMUSCULAR | Status: DC | PRN
Start: 2023-12-12 — End: 2023-12-12
  Administered 2023-12-12: 2 mg via INTRAVENOUS

## 2023-12-12 MED ORDER — CEFAZOLIN SODIUM-DEXTROSE 2-4 GM/100ML-% IV SOLN
2.0000 g | INTRAVENOUS | Status: AC
  Administered 2023-12-12: 2 g via INTRAVENOUS
  Filled 2023-12-12: qty 100

## 2023-12-12 MED ORDER — PHENYLEPHRINE 80 MCG/ML (10ML) SYRINGE FOR IV PUSH (FOR BLOOD PRESSURE SUPPORT)
PREFILLED_SYRINGE | INTRAVENOUS | Status: AC
  Filled 2023-12-12: qty 10

## 2023-12-12 SURGICAL SUPPLY — 16 items
BAG DRAIN URO TABLE W/ADPT NS (BAG) ×1 IMPLANT
BAG HAMPER (MISCELLANEOUS) ×1 IMPLANT
GLOVE BIO SURGEON STRL SZ8 (GLOVE) ×1 IMPLANT
GLOVE BIOGEL PI IND STRL 7.0 (GLOVE) ×2 IMPLANT
GOWN STRL REUS W/TWL LRG LVL3 (GOWN DISPOSABLE) ×1 IMPLANT
GOWN STRL REUS W/TWL XL LVL3 (GOWN DISPOSABLE) ×1 IMPLANT
KIT TURNOVER CYSTO (KITS) ×1 IMPLANT
MANIFOLD NEPTUNE II (INSTRUMENTS) ×1 IMPLANT
PACK CYSTO (CUSTOM PROCEDURE TRAY) ×1 IMPLANT
PAD ARMBOARD POSITIONER FOAM (MISCELLANEOUS) ×1 IMPLANT
SYSTEM UROLIFT 2 CART W/ HNDL (Male Continence) IMPLANT
SYSTEM UROLIFT 2 CARTRIDGE (Male Continence) IMPLANT
TOWEL OR 17X26 4PK STRL BLUE (TOWEL DISPOSABLE) ×1 IMPLANT
TRAY FOLEY W/BAG SLVR 16FR ST (SET/KITS/TRAYS/PACK) ×1 IMPLANT
WATER STERILE IRR 3000ML UROMA (IV SOLUTION) ×1 IMPLANT
WATER STERILE IRR 500ML POUR (IV SOLUTION) ×1 IMPLANT

## 2023-12-12 NOTE — Op Note (Signed)
   PREOPERATIVE DIAGNOSIS:  Benign prostatic hypertrophy with bladder outlet obstruction.  POSTOPERATIVE DIAGNOSIS:  Benign prostatic hypertrophy with bladder outlet obstruction.  PROCEDURE:  Cystoscopy with implantation of UroLift devices, 6 implants.  SURGEON:  Wilkie Aye, M.D.  ANESTHESIA:  General  ANTIBIOTICS: ancef  SPECIMEN:  None.  DRAINS:  A 16-French Foley catheter.  BLOOD LOSS:  Minimal.  COMPLICATIONS:  None.  INDICATIONS: The Patient is an 68 year old male with BPH and bladder outlet obstruction.  He has failed medical therapy and has elected UroLift for definitive treatment.  FINDINGS OF PROCEDURE:  He was taken to the operating room where a general anesthetic was induced.  He was placed in lithotomy position and was fitted with PAS hose.  His perineum and genitalia were prepped with chlorhexidine, and he was draped in usual sterile fashion.  Cystoscopy was performed using the UroLift scope and 0 degree lens. Examination revealed a normal urethra.  The external sphincter was intact.  Prostatic urethra was approximately 5 cm in length with lateral lobe enlargement. There was also little bit of bladder neck elevation. Inspection of bladder revealed mild-to-moderate trabeculation with no tumors, stones, or inflammation.  No cellules or diverticula were noted. Ureteral orifices were in their normal anatomic position effluxing clear urine.  After initial cystoscopy, the visual obturator was replaced with the first UroLift device.  This was turned to the 9 o'clock position and pulled back to the veru and then slightly advanced.  Pressure was then applied to the right lateral lobe and the UroLift device was deployed.  The second UroLift device was then inserted and applied to the left lateral lobe at 3 o'clock and deployed in the mid prostatic urethra. After this, there was still some apparent obstruction closer to the bladder neck.  So a second and  third level of UroLift your left device was applied between the mid urethra and the proximal urethra providing further patency to the prostatic urethra.   The scope was removed and a 16-French Foley catheter was inserted without difficulty. The balloon was filled with 10 mL sterile fluid, and the catheter was placed to straight drainage.  COMPLICATIONS: None   CONDITION: Stable, extubated, transferred to PACU  PLAN: The patient will be discharged home and followup in 2 days for a voiding trial.

## 2023-12-12 NOTE — Anesthesia Postprocedure Evaluation (Signed)
 Anesthesia Post Note  Patient: Roger Guerra  Procedure(s) Performed: CYSTOSCOPY WITH INSERTION OF UROLIFT (Prostate)  Patient location during evaluation: PACU Anesthesia Type: General Level of consciousness: awake and alert Pain management: pain level controlled Vital Signs Assessment: post-procedure vital signs reviewed and stable Respiratory status: spontaneous breathing, nonlabored ventilation, respiratory function stable and patient connected to nasal cannula oxygen Cardiovascular status: blood pressure returned to baseline and stable Postop Assessment: no apparent nausea or vomiting Anesthetic complications: no   There were no known notable events for this encounter.   Last Vitals:  Vitals:   12/12/23 0808 12/12/23 0813  BP: 134/77 133/80  Pulse: 72 89  Resp: 13 15  Temp: 36.5 C   SpO2: 98% 98%    Last Pain:  Vitals:   12/12/23 0813  PainSc: 0-No pain                 Dickie Labarre L Dondrea Clendenin

## 2023-12-12 NOTE — Anesthesia Preprocedure Evaluation (Addendum)
 Anesthesia Evaluation  Patient identified by MRN, date of birth, ID band Patient awake    Reviewed: Allergy & Precautions, H&P , NPO status , Patient's Chart, lab work & pertinent test results, reviewed documented beta blocker date and time   Airway Mallampati: II  TM Distance: >3 FB Neck ROM: full    Dental  (+) Dental Advisory Given, Missing Most of lower teeth missing and some upper back ones too:   Pulmonary sleep apnea , former smoker   Pulmonary exam normal breath sounds clear to auscultation       Cardiovascular Exercise Tolerance: Good + CAD and + Past MI  Normal cardiovascular exam Rhythm:regular Rate:Normal     Neuro/Psych  PSYCHIATRIC DISORDERS Anxiety Depression    Spinal stenosis  Neuromuscular disease    GI/Hepatic Neg liver ROS,GERD  Medicated,,  Endo/Other  diabetes, Type 2    Renal/GU negative Renal ROS  negative genitourinary   Musculoskeletal   Abdominal   Peds  Hematology negative hematology ROS (+)   Anesthesia Other Findings   Reproductive/Obstetrics negative OB ROS                             Anesthesia Physical Anesthesia Plan  ASA: 3  Anesthesia Plan: General   Post-op Pain Management: Dilaudid IV   Induction: Intravenous  PONV Risk Score and Plan: Ondansetron, Dexamethasone and Midazolam  Airway Management Planned: LMA  Additional Equipment: None  Intra-op Plan:   Post-operative Plan: Extubation in OR  Informed Consent: I have reviewed the patients History and Physical, chart, labs and discussed the procedure including the risks, benefits and alternatives for the proposed anesthesia with the patient or authorized representative who has indicated his/her understanding and acceptance.     Dental Advisory Given  Plan Discussed with: CRNA  Anesthesia Plan Comments:         Anesthesia Quick Evaluation

## 2023-12-12 NOTE — H&P (Signed)
 HPI: Roger Guerra is a 68yo here for Urolift for BPH and incomplete emptying. IPSS 10 QOL 3 on rapaflo 8mg  BID and finasteride. PVR 200cc. Urine stream intermittently strong.  Nocturia 1x. He is having bothersome post void dribbling.     PMH:     Past Medical History:  Diagnosis Date   Anxiety disorder     Arthritis     Atherosclerosis     Back pain     Cervical radiculopathy     Constipation     Coronary artery calcification seen on CT scan     Depression     Diabetes (HCC)     Erectile dysfunction     GERD (gastroesophageal reflux disease)     Heartburn     History of blood clots     History of DVT (deep vein thrombosis)     History of DVT of lower extremity      Right leg 2002    History of pneumonia     Hyperlipidemia     Hypogonadism in male     Hypopituitarism (HCC)     Joint pain     Nerve root and plexus disorder     Obesity     Obstructive sleep apnea     OSA on CPAP     Osteoarthritis     Sciatica     Sleep apnea     SOBOE (shortness of breath on exertion)     Spinal stenosis     Spondylosis     Swelling of both lower extremities     Type 2 diabetes mellitus (HCC)     Vitamin D deficiency            Surgical History:      Past Surgical History:  Procedure Laterality Date   BLADDER SURGERY   2015    removal of skin tag near urethra   COLONOSCOPY N/A 12/10/2016    Procedure: COLONOSCOPY;  Surgeon: Corbin Ade, MD;  Location: [facility];  Service: Endoscopy;  Laterality: N/A;  8:30 AM   COLONOSCOPY WITH PROPOFOL N/A 04/29/2022    Procedure: COLONOSCOPY WITH PROPOFOL;  Surgeon: Corbin Ade, MD;  Location: [facility];  Service: Endoscopy;  Laterality: N/A;  8:00am   KNEE ARTHROSCOPY WITH MEDIAL MENISECTOMY Right 08/25/2017    Procedure: Right knee arthroscopy, partial medial menisectomy and debridement;  Surgeon: Jene Every, MD;  Location: [facility];  Service: Orthopedics;  Laterality: Right;  60 mins   POLYPECTOMY   12/10/2016     Procedure: POLYPECTOMY;  Surgeon: Corbin Ade, MD;  Location: [facility];  Service: Endoscopy;;  hepatic flexure   PTOSIS REPAIR Bilateral 02/2022   TONSILLECTOMY       TOTAL KNEE ARTHROPLASTY Left 03/02/2023    Procedure: TOTAL KNEE ARTHROPLASTY;  Surgeon: Jene Every, MD;  Location: [facility];  Service: Orthopedics;  Laterality: Left;   WISDOM TOOTH EXTRACTION              Home Medications:  Allergies as of 10/24/2023         Reactions    Invokana [canagliflozin] Other (See Comments)    Yeast infection    Statins Other (See Comments)    Severe muscle cramps            Medication List           Accurate as of October 24, 2023  2:48 PM. If you have any questions, ask your nurse or doctor.  5-HTP PO Take 150 mg by mouth daily.    AMBULATORY NON FORMULARY MEDICATION Medication Name: Trimix   PGE 30 mcg Pap 30 mg Phent 1 mg What changed:  when to take this reasons to take this    cetirizine 10 MG tablet Commonly known as: ZYRTEC Take 10 mg by mouth every evening.    COENZYME Q10 PO Take 200 mg by mouth daily.    ezetimibe 10 MG tablet Commonly known as: ZETIA Take 1 tablet (10 mg total) by mouth daily.    fexofenadine 180 MG tablet Commonly known as: ALLEGRA Take 180 mg by mouth every morning.    finasteride 5 MG tablet Commonly known as: PROSCAR Take 1 tablet (5 mg total) by mouth daily.    glipiZIDE 10 MG tablet Commonly known as: GLUCOTROL Take 1 tablet (10 mg total) by mouth 2 (two) times daily before a meal. **NEEDS TO BE SEEN BEFORE NEXT REFILL**    losartan 25 MG tablet Commonly known as: COZAAR Take 25 mg by mouth daily.    Luer Lock Safety Syringes 22G X 1" 3 ML Misc Generic drug: SYRINGE-NEEDLE (DISP) 3 ML For testosterone  injections    MAGNESIUM MALATE PO Take 1,000 mg by mouth daily.    methocarbamol 500 MG tablet Commonly known as: ROBAXIN Take 1 tablet (500 mg total) by mouth every 8 (eight) hours as needed  for muscle spasms.    multivitamin with minerals Tabs tablet Take 1 tablet by mouth daily.    omeprazole 20 MG tablet Commonly known as: PRILOSEC OTC Take 1 tablet (20 mg total) by mouth daily as needed (acid reflux).    ondansetron 4 MG tablet Commonly known as: Zofran Take 1 tablet (4 mg total) by mouth every 8 (eight) hours as needed for nausea or vomiting.    OneTouch Ultra test strip Generic drug: glucose blood check blood sugar once daily. DX:E11.69    Pen Needles 31G X 6 MM Misc 1 each by Does not apply route daily.    Repatha SureClick 140 MG/ML Soaj Generic drug: Evolocumab Inject 140 mg into the skin every 14 (fourteen) days.    silodosin 8 MG Caps capsule Commonly known as: RAPAFLO Take 1 capsule (8 mg total) by mouth in the morning and at bedtime.    sodium chloride 0.65 % Soln nasal spray Commonly known as: OCEAN Place 1 spray into both nostrils 2 (two) times daily.    testosterone cypionate 200 MG/ML injection Commonly known as: DEPOTESTOSTERONE CYPIONATE Inject 100 mg into the muscle every 14 (fourteen) days. For IM use only    Trulicity 3 MG/0.5ML Soaj Generic drug: Dulaglutide Inject 3 mg as directed once a week. **NEEDS TO BE SEEN BEFORE NEXT REFILL**    Vitamin D3 125 MCG (5000 UT) Caps Take 5,000 Units by mouth daily.    zinc gluconate 50 MG tablet Take 50 mg by mouth daily.             Allergies:  Allergies       Allergies  Allergen Reactions   Invokana [Canagliflozin] Other (See Comments)      Yeast infection   Statins Other (See Comments)      Severe muscle cramps        Family History:      Family History  Problem Relation Guerra of Onset   Diabetes Mother     Lung cancer Father     Heart disease Father     Stroke Father  Cancer Father     Diabetes Sister     Diabetes Other          Siblings          Social History:  reports that he has quit smoking. His smoking use included cigarettes. He has a 37.5 pack-year  smoking history. He has never used smokeless tobacco. He reports that he does not drink alcohol and does not use drugs.   ROS: All other review of systems were reviewed and are negative except what is noted above in HPI   Physical Exam: BP 138/76   Pulse (!) 101   Constitutional:  Alert and oriented, No acute distress. HEENT:  AT, moist mucus membranes.  Trachea midline, no masses. Cardiovascular: No clubbing, cyanosis, or edema. Respiratory: Normal respiratory effort, no increased work of breathing. GI: Abdomen is soft, nontender, nondistended, no abdominal masses GU: No CVA tenderness.  Lymph: No cervical or inguinal lymphadenopathy. Skin: No rashes, bruises or suspicious lesions. Neurologic: Grossly intact, no focal deficits, moving all 4 extremities. Psychiatric: Normal mood and affect.   Laboratory Data: Recent Labs       Lab Results  Component Value Date    WBC 17.2 (H) 03/04/2023    HGB 13.9 03/04/2023    HCT 40.3 03/04/2023    MCV 90.0 03/04/2023    PLT 193 03/04/2023        Recent Labs       Lab Results  Component Value Date    CREATININE 0.92 03/05/2023        Recent Labs  No results found for: "PSA"     Recent Labs       Lab Results  Component Value Date    TESTOSTERONE 592 06/16/2021        Recent Labs       Lab Results  Component Value Date    HGBA1C 7.5 (H) 02/23/2023        Urinalysis Labs (Brief)          Component Value Date/Time    APPEARANCEUR Clear 04/22/2023 1244    GLUCOSEU Negative 04/22/2023 1244    BILIRUBINUR Negative 04/22/2023 1244    PROTEINUR Negative 04/22/2023 1244    NITRITE Negative 04/22/2023 1244    LEUKOCYTESUR Negative 04/22/2023 1244        Recent Labs       Lab Results  Component Value Date    LABMICR Comment 04/22/2023        Pertinent Imaging:   No results found for this or any previous visit.   No results found for this or any previous visit.   No results found for this or any previous  visit.   No results found for this or any previous visit.   No results found for this or any previous visit.   No results found for this or any previous visit.   No results found for this or any previous visit.   No results found for this or any previous visit.     Assessment & Plan:     1. Incomplete bladder emptying -continue rapaflo 8mg  BID and finasteride - BLADDER SCAN AMB NON-IMAGING   2. Benign prostatic hyperplasia with urinary obstruction (Primary) We discussed the management of his BPH including continued medical therapy, Rezum, Urolift, TURP and simple prostatectomy. After discussing the options the patient has elected to proceed with urolift. Risks/benefits/alternatives discussed.

## 2023-12-12 NOTE — Anesthesia Procedure Notes (Signed)
 Procedure Name: LMA Insertion Date/Time: 12/12/2023 7:44 AM  Performed by: Julian Reil, CRNAPre-anesthesia Checklist: Patient identified, Emergency Drugs available, Suction available and Patient being monitored Patient Re-evaluated:Patient Re-evaluated prior to induction Oxygen Delivery Method: Circle system utilized Preoxygenation: Pre-oxygenation with 100% oxygen Induction Type: IV induction LMA: LMA with gastric port inserted LMA Size: 4.0 Tube type: Oral Number of attempts: 1 Placement Confirmation: positive ETCO2 Tube secured with: Tape Dental Injury: Teeth and Oropharynx as per pre-operative assessment

## 2023-12-12 NOTE — Transfer of Care (Signed)
 Immediate Anesthesia Transfer of Care Note  Patient: Roger Guerra  Procedure(s) Performed: CYSTOSCOPY WITH INSERTION OF UROLIFT (Prostate)  Patient Location: PACU  Anesthesia Type:General  Level of Consciousness: drowsy  Airway & Oxygen Therapy: Patient Spontanous Breathing and Patient connected to face mask oxygen  Post-op Assessment: Report given to RN and Post -op Vital signs reviewed and stable  Post vital signs: Reviewed and stable  Last Vitals:  Vitals Value Taken Time  BP 134/77   Temp 97.7   Pulse 77   Resp 12   SpO2 98%     Last Pain:  Vitals:   12/12/23 0638  PainSc: 0-No pain         Complications: No notable events documented.

## 2023-12-13 ENCOUNTER — Encounter (HOSPITAL_COMMUNITY): Payer: Self-pay | Admitting: Urology

## 2023-12-13 NOTE — Progress Notes (Unsigned)
 Name: Roger Guerra DOB: [birth year] MRN: [id number]  Diagnoses: Post-operative state  HPI: Roger Guerra presents post-operatively.  GU History: 1. BPH with BOO & LUTS (intermittent urine stream, nocturia x1, terminal dribbling, incomplete bladder emptying). - Has taken Rapaflo 8 mg BID and Finasteride 5 mg daily.  2. Hypogonadism. Does testosterone replacement therapy.  Today He presents s/p the following procedures by Dr. Ronne Binning on 12/12/2023:  PREOPERATIVE DIAGNOSIS:  Benign prostatic hypertrophy with bladder outlet obstruction.   POSTOPERATIVE DIAGNOSIS:  Benign prostatic hypertrophy with bladder outlet obstruction.   PROCEDURE:  Cystoscopy with implantation of UroLift devices, 6 implants.   Postop course: He reports the catheter is draining well, has had some pink-tinged urine.  He denies acute flank pain, abdominal pain, fevers, nausea, or vomiting.   Medications: Current Outpatient Medications  Medication Sig Dispense Refill   5-Hydroxytryptophan (5-HTP PO) Take 100 mg by mouth at bedtime.     AMBULATORY NON FORMULARY MEDICATION Medication Name: Trimix  PGE 30 mcg Pap 30 mg Phent 1 mg (Patient taking differently: daily as needed (ED). Medication Name: Trimix  PGE 30 mcg Pap 30 mg Phent 1 mg) 5 mL 5   Ascorbic Acid (VITAMIN C) 1000 MG tablet Take 1,000 mg by mouth in the morning.     aspirin EC 81 MG tablet Take 81 mg by mouth in the morning. Swallow whole.     cetirizine (ZYRTEC) 10 MG tablet Take 10 mg by mouth every evening.      Cholecalciferol (VITAMIN D3) 125 MCG (5000 UT) CAPS Take 5,000 Units by mouth in the morning.     COENZYME Q10 PO Take 200 mg by mouth in the morning.     Dulaglutide (TRULICITY) 3 MG/0.5ML SOPN Inject 3 mg as directed once a week. **NEEDS TO BE SEEN BEFORE NEXT REFILL** (Patient taking differently: Inject 3 mg as directed every Sunday. **NEEDS TO BE SEEN BEFORE NEXT REFILL**) 2 mL 0   Evolocumab (REPATHA SURECLICK) 140 MG/ML SOAJ  Inject 140 mg into the skin every 14 (fourteen) days. (Patient taking differently: Inject 140 mg into the skin every 14 (fourteen) days. Sundays) 6 mL 3   ezetimibe (ZETIA) 10 MG tablet Take 1 tablet (10 mg total) by mouth daily. 90 tablet 3   fexofenadine (ALLEGRA) 180 MG tablet Take 180 mg by mouth every morning.      finasteride (PROSCAR) 5 MG tablet Take 1 tablet (5 mg total) by mouth daily. 90 tablet 3   fluticasone (FLONASE) 50 MCG/ACT nasal spray Place 2 sprays into both nostrils daily as needed for allergies.     glipiZIDE (GLUCOTROL) 10 MG tablet Take 10 mg by mouth daily before breakfast.     glucose blood (ONETOUCH ULTRA) test strip check blood sugar once daily. DX:E11.69 100 each 3   ibuprofen (ADVIL) 800 MG tablet Take 800 mg by mouth every 8 (eight) hours as needed (pain.).     Insulin Pen Needle (PEN NEEDLES) 31G X 6 MM MISC 1 each by Does not apply route daily. 100 each 1   LANTUS SOLOSTAR 100 UNIT/ML Solostar Pen Inject 50 Units into the skin at bedtime.     losartan (COZAAR) 50 MG tablet Take 50 mg by mouth daily.     MAGNESIUM BISGLYCINATE PO Take 1 capsule by mouth in the morning.     methocarbamol (ROBAXIN) 500 MG tablet Take 1 tablet (500 mg total) by mouth every 8 (eight) hours as needed for muscle spasms. 30 tablet 1   Multiple Vitamin (  MULTIVITAMIN WITH MINERALS) TABS tablet Take 1 tablet by mouth in the morning. Centrum     Omega-3 Fatty Acids (FISH OIL) 1000 MG CAPS Take 1,000 mg by mouth in the morning.     omeprazole (PRILOSEC) 20 MG capsule Take 20 mg by mouth every evening.     Probiotic Product (PROBIOTIC PO) Take 1 capsule by mouth in the morning.  Probiotic-10 25 Billion with 10 Probiotic Strains Veggie Capsules     silodosin (RAPAFLO) 8 MG CAPS capsule Take 1 capsule (8 mg total) by mouth in the morning and at bedtime. 180 capsule 3   sodium chloride (OCEAN) 0.65 % SOLN nasal spray Place 1 spray into both nostrils 2 (two) times daily.     Specialty Vitamins  Products (ECHINACEA C COMPLETE PO) Take 1 capsule by mouth daily as needed (immune health).     SYRINGE-NEEDLE, DISP, 3 ML (LUER LOCK SAFETY SYRINGES) 22G X 1" 3 ML MISC For testosterone  injections 30 each 1   testosterone cypionate (DEPOTESTOSTERONE CYPIONATE) 200 MG/ML injection Inject 100 mg into the muscle every 14 (fourteen) days. For IM use only     traMADol (ULTRAM) 50 MG tablet Take 1 tablet (50 mg total) by mouth every 6 (six) hours as needed. 15 tablet 0   vitamin E 180 MG (400 UNITS) capsule Take 400 Units by mouth in the morning.     ZINC PICOLINATE PO Take 50 mg by mouth in the morning.     No current facility-administered medications for this visit.    Allergies: Allergies  Allergen Reactions   Invokana [Canagliflozin] Other (See Comments)    Yeast infection   Statins Other (See Comments)    Severe muscle cramps    Past Medical History:  Diagnosis Date   Anxiety disorder    Arthritis    Atherosclerosis    Back pain    Cervical radiculopathy    Constipation    Coronary artery calcification seen on CT scan    Depression    Diabetes (HCC)    Erectile dysfunction    GERD (gastroesophageal reflux disease)    Heartburn    History of blood clots    History of DVT (deep vein thrombosis)    History of DVT of lower extremity    Right leg 2002    History of pneumonia    Hyperlipidemia    Hypogonadism in male    Hypopituitarism (HCC)    Joint pain    Myocardial infarction (HCC)    Nerve root and plexus disorder    Obesity    Obstructive sleep apnea    OSA on CPAP    Osteoarthritis    Sciatica    Sleep apnea    SOBOE (shortness of breath on exertion)    Spinal stenosis    Spondylosis    Swelling of both lower extremities    Type 2 diabetes mellitus (HCC)    Vitamin D deficiency    Past Surgical History:  Procedure Laterality Date   BLADDER SURGERY  2015   removal of skin tag near urethra   COLONOSCOPY N/A 12/10/2016   Procedure: COLONOSCOPY;  Surgeon:  Corbin Ade, MD;  Location: [facility];  Service: Endoscopy;  Laterality: N/A;  8:30 AM   COLONOSCOPY WITH PROPOFOL N/A 04/29/2022   Procedure: COLONOSCOPY WITH PROPOFOL;  Surgeon: Corbin Ade, MD;  [facility]: [facility];  Service: Endoscopy;  Laterality: N/A;  8:00am   CYSTOSCOPY WITH INSERTION OF UROLIFT N/A 12/12/2023  Procedure: CYSTOSCOPY WITH INSERTION OF UROLIFT;  Surgeon: Malen Gauze, MD;  Location: [facility];  Service: Urology;  Laterality: N/A;   KNEE ARTHROSCOPY WITH MEDIAL MENISECTOMY Right 08/25/2017   Procedure: Right knee arthroscopy, partial medial menisectomy and debridement;  Surgeon: Jene Every, MD;  Location: [facility];  Service: Orthopedics;  Laterality: Right;  60 mins   POLYPECTOMY  12/10/2016   Procedure: POLYPECTOMY;  Surgeon: Corbin Ade, MD;  [facility];  Service: Endoscopy;;  hepatic flexure   PTOSIS REPAIR Bilateral 02/2022   TONSILLECTOMY     TOTAL KNEE ARTHROPLASTY Left 03/02/2023   Procedure: TOTAL KNEE ARTHROPLASTY;  Surgeon: Jene Every, MD;  Location: [facility];  Service: Orthopedics;  Laterality: Left;   WISDOM TOOTH EXTRACTION     Family History  Problem Relation Age of Onset   Diabetes Mother    Lung cancer Father    Heart disease Father    Stroke Father    Cancer Father    Diabetes Sister    Diabetes Other        Siblings   Social History   Socioeconomic History   Marital status: Married    Spouse name: Zohaib Heeney   Number of children: 2   Years of education: Not on file   Highest education level: Master's degree (e.g., MA, MS, MEng, MEd, MSW, MBA)  Occupational History   Occupation: Retired Runner, broadcasting/film/video but work part-time teaching  Tobacco Use   Smoking status: Former    Current packs/day: 2.50    Average packs/day: 2.5 packs/day for 15.0 years (37.5 ttl pk-yrs)    Types: Cigarettes   Smokeless tobacco: Never  Vaping Use   Vaping status: Never Used  Substance and Sexual Activity   Alcohol use: No     Alcohol/week: 0.0 standard drinks of alcohol   Drug use: No   Sexual activity: Not on file  Other Topics Concern   Not on file  Social History Narrative   Not on file   Social Drivers of Health   Financial Resource Strain: Patient Declined (12/17/2022)   Overall Financial Resource Strain (CARDIA)    Difficulty of Paying Living Expenses: Patient declined  Food Insecurity: No Food Insecurity (03/08/2023)   Hunger Vital Sign    Worried About Running Out of Food in the Last Year: Never true    Ran Out of Food in the Last Year: Never true  Transportation Needs: No Transportation Needs (03/08/2023)   PRAPARE - Administrator, Civil Service (Medical): No    Lack of Transportation (Non-Medical): No  Physical Activity: Insufficiently Active (12/17/2022)   Exercise Vital Sign    Days of Exercise per Week: 3 days    Minutes of Exercise per Session: 30 min  Stress: No Stress Concern Present (12/17/2022)   [employer] of Occupational Health - Occupational Stress Questionnaire    Feeling of Stress : Not at all  Social Connections: Unknown (12/17/2022)   Social Connection and Isolation Panel [NHANES]    Frequency of Communication with Friends and Family: Patient declined    Frequency of Social Gatherings with Friends and Family: Patient declined    Attends Religious Services: Patient declined    Database administrator or Organizations: Patient declined    Attends Banker Meetings: Not on file    Marital Status: Married  Intimate Partner Violence: Not At Risk (03/02/2023)   Humiliation, Afraid, Rape, and Kick questionnaire    Fear of Current or Ex-Partner: No  Emotionally Abused: No    Physically Abused: No    Sexually Abused: No    SUBJECTIVE  Review of Systems Constitutional: Patient denies any unintentional weight loss or change in strength lntegumentary: Patient denies any rashes or pruritus Cardiovascular: Patient denies chest pain or  syncope Respiratory: Patient denies shortness of breath Gastrointestinal: Patient denies nausea or vomiting Musculoskeletal: Patient denies muscle cramps or weakness Neurologic: Patient denies convulsions or seizures Allergic/Immunologic: Patient denies recent allergic reaction(s) Hematologic/Lymphatic: Patient denies bleeding tendencies Endocrine: Patient denies heat/cold intolerance  GU: As per HPI.  OBJECTIVE Vitals:   12/14/23 0924  BP: 133/83  Pulse: 85   There is no height or weight on file to calculate BMI.  Physical Examination Constitutional: No obvious distress; patient is non-toxic appearing  Cardiovascular: No visible lower extremity edema.  Respiratory: The patient does not have audible wheezing/stridor; respirations do not appear labored  Gastrointestinal: Abdomen non-distended Musculoskeletal: Normal ROM of UEs  Skin: No obvious rashes/open sores  Neurologic: CN 2-12 grossly intact Psychiatric: Answered questions appropriately with normal affect  Hematologic/Lymphatic/Immunologic: No obvious bruises or sites of spontaneous bleeding  ASSESSMENT Benign prostatic hyperplasia with urinary obstruction - Plan: Bladder Voiding Trial, ciprofloxacin (CIPRO) tablet 500 mg  Postop check  We reviewed the operative procedures and findings.   Passed voiding trial. Foley catheter to remain out. Foley catheter to remain out; patient instructed to return this afternoon for PVR check to ensure adequate bladder emptying.   Advised to take laxatives/stool softeners PRN for constipation and to minimize straining (such as squats or heavy lifting) for at least the next few days.   We discussed expectation that his LUTS will improve postop but also discussed his risk factors for neurogenic bladder such as T2DM, spinal stenosis, smoking history. Also discussed his significant caffeine use ("2 pots of coffee per day") as a contributing factor for bladder overactivity and advised  gradually decreasing his daily caffeine intake.  Will plan for follow up with Dr. Ronne Binning in 8 weeks or sooner if needed. Patient verbalized understanding of and agreement with current plan. All questions were answered.  PLAN Advised the following: Foley catheter discontinued. Minimize caffeine intake. Continue Rapaflo (Silodosin) 8 mg BID and Proscar (Finasteride) 5 mg daily for now. May trial tapering down / off Silodosin starting next week. Return in about 8 weeks (around 02/08/2024) for f/u with Dr. Ronne Binning.  Orders Placed This Encounter  Procedures   Bladder Voiding Trial    It has been explained that the patient is to follow regularly with their PCP in addition to all other providers involved in their care and to follow instructions provided by these respective offices. Patient advised to contact urology clinic if any urologic-pertaining questions, concerns, new symptoms or problems arise in the interim period.  There are no Patient Instructions on file for this visit.  Electronically signed by:  Donnita Falls, MSN, FNP-C, CUNP 12/14/2023 10:17 AM

## 2023-12-14 ENCOUNTER — Ambulatory Visit: Payer: PPO | Admitting: Urology

## 2023-12-14 ENCOUNTER — Encounter: Payer: Self-pay | Admitting: Urology

## 2023-12-14 VITALS — BP 133/83 | HR 85

## 2023-12-14 DIAGNOSIS — N401 Enlarged prostate with lower urinary tract symptoms: Secondary | ICD-10-CM | POA: Diagnosis not present

## 2023-12-14 DIAGNOSIS — N138 Other obstructive and reflux uropathy: Secondary | ICD-10-CM | POA: Diagnosis not present

## 2023-12-14 DIAGNOSIS — Z09 Encounter for follow-up examination after completed treatment for conditions other than malignant neoplasm: Secondary | ICD-10-CM

## 2023-12-14 MED ORDER — CIPROFLOXACIN HCL 500 MG PO TABS
500.0000 mg | ORAL_TABLET | Freq: Once | ORAL | Status: AC
Start: 2023-12-14 — End: 2023-12-14
  Administered 2023-12-14: 500 mg via ORAL

## 2023-12-14 NOTE — Progress Notes (Signed)
 Fill and Pull Catheter Removal  Patient is present today for a catheter removal.  10ml of sterile water was instilled into the bladder when the patient felt the urge to urinate. 10ml of water was then drained from the balloon.  A 16FR foley cath was removed from the bladder no complications were noted .  Foley catheter intact and time of removal. Patient as then given some time to void on their own.  Patient can void  on their own after some time.  Patient tolerated well.  One oral prophylactic antibiotic given per MD orders  Performed by: Gwendolyn Grant T. CMA  Follow up/ Additional notes: As scheduled

## 2023-12-20 ENCOUNTER — Encounter: Payer: Self-pay | Admitting: Urology

## 2023-12-21 ENCOUNTER — Other Ambulatory Visit: Payer: Self-pay | Admitting: Urology

## 2023-12-21 DIAGNOSIS — M9901 Segmental and somatic dysfunction of cervical region: Secondary | ICD-10-CM | POA: Diagnosis not present

## 2023-12-21 DIAGNOSIS — M62830 Muscle spasm of back: Secondary | ICD-10-CM | POA: Diagnosis not present

## 2023-12-21 DIAGNOSIS — N410 Acute prostatitis: Secondary | ICD-10-CM

## 2023-12-21 DIAGNOSIS — M9903 Segmental and somatic dysfunction of lumbar region: Secondary | ICD-10-CM | POA: Diagnosis not present

## 2023-12-21 DIAGNOSIS — M9902 Segmental and somatic dysfunction of thoracic region: Secondary | ICD-10-CM | POA: Diagnosis not present

## 2023-12-21 DIAGNOSIS — M546 Pain in thoracic spine: Secondary | ICD-10-CM | POA: Diagnosis not present

## 2023-12-21 DIAGNOSIS — M9905 Segmental and somatic dysfunction of pelvic region: Secondary | ICD-10-CM | POA: Diagnosis not present

## 2023-12-21 MED ORDER — DOXYCYCLINE HYCLATE 100 MG PO CAPS: 100.0000 mg | ORAL_CAPSULE | Freq: Two times a day (BID) | ORAL | 0 refills | Status: AC

## 2023-12-22 ENCOUNTER — Ambulatory Visit: Admitting: Internal Medicine

## 2023-12-22 ENCOUNTER — Encounter: Payer: Self-pay | Admitting: *Deleted

## 2023-12-22 ENCOUNTER — Encounter: Payer: Self-pay | Admitting: Internal Medicine

## 2023-12-22 VITALS — BP 138/76 | HR 85 | Temp 97.3°F | Ht 71.0 in | Wt 274.6 lb

## 2023-12-22 DIAGNOSIS — R1110 Vomiting, unspecified: Secondary | ICD-10-CM

## 2023-12-22 DIAGNOSIS — R1310 Dysphagia, unspecified: Secondary | ICD-10-CM | POA: Diagnosis not present

## 2023-12-22 DIAGNOSIS — R1319 Other dysphagia: Secondary | ICD-10-CM

## 2023-12-22 DIAGNOSIS — K219 Gastro-esophageal reflux disease without esophagitis: Secondary | ICD-10-CM | POA: Diagnosis not present

## 2023-12-22 NOTE — Progress Notes (Addendum)
 Primary Care Physician:  Benita Stabile, MD Primary Gastroenterologist:  Dr. Marletta Lor  Chief Complaint  Patient presents with   Dysphagia    Food is getting hung up    HPI:   Roger Guerra is a 68 y.o. male who presents to the clinic today by referral from his PCP Dr. Margo Aye for evaluation.  Patient reports he has had chronic acid reflux for many years, historically on esomeprazole, then changed to omeprazole for which he was taking as needed, now on pantoprazole 40 mg daily  for the last 2 weeks.  Reports his reflux is better controlled with the pantoprazole though he is having progressively worsening esophageal dysphagia.  Feels like food will get stuck in his substernal region.  Occasional food regurgitation.  No nausea or vomiting.  No melena hematochezia.  Takes ibuprofen on occasion.  No previous upper endoscopy.  Past Medical History:  Diagnosis Date   Anxiety disorder    Arthritis    Atherosclerosis    Back pain    Cervical radiculopathy    Constipation    Coronary artery calcification seen on CT scan    Depression    Diabetes (HCC)    Erectile dysfunction    GERD (gastroesophageal reflux disease)    Heartburn    History of blood clots    History of DVT (deep vein thrombosis)    History of DVT of lower extremity    Right leg 2002    History of pneumonia    Hyperlipidemia    Hypogonadism in male    Hypopituitarism (HCC)    Joint pain    Myocardial infarction (HCC)    Nerve root and plexus disorder    Obesity    Obstructive sleep apnea    OSA on CPAP    Osteoarthritis    Sciatica    Sleep apnea    SOBOE (shortness of breath on exertion)    Spinal stenosis    Spondylosis    Swelling of both lower extremities    Type 2 diabetes mellitus (HCC)    Vitamin D deficiency     Past Surgical History:  Procedure Laterality Date   BLADDER SURGERY  2015   removal of skin tag near urethra   COLONOSCOPY N/A 12/10/2016   Procedure: COLONOSCOPY;  Surgeon: Corbin Ade, MD;  Location: [facility];  Service: Endoscopy;  Laterality: N/A;  8:30 AM   COLONOSCOPY WITH PROPOFOL N/A 04/29/2022   Procedure: COLONOSCOPY WITH PROPOFOL;  Surgeon: Corbin Ade, MD;  Location: [facility];  Service: Endoscopy;  Laterality: N/A;  8:00am   CYSTOSCOPY WITH INSERTION OF UROLIFT N/A 12/12/2023   Procedure: CYSTOSCOPY WITH INSERTION OF UROLIFT;  Surgeon: Malen Gauze, MD;  Location: [facility];  Service: Urology;  Laterality: N/A;   KNEE ARTHROSCOPY WITH MEDIAL MENISECTOMY Right 08/25/2017   Procedure: Right knee arthroscopy, partial medial menisectomy and debridement;  Surgeon: Jene Every, MD;  Location: [facility];  Service: Orthopedics;  Laterality: Right;  60 mins   POLYPECTOMY  12/10/2016   Procedure: POLYPECTOMY;  Surgeon: Corbin Ade, MD;  Location: [facility];  Service: Endoscopy;;  hepatic flexure   PTOSIS REPAIR Bilateral 02/2022   TONSILLECTOMY     TOTAL KNEE ARTHROPLASTY Left 03/02/2023   Procedure: TOTAL KNEE ARTHROPLASTY;  Surgeon: Jene Every, MD;  Location: [facility];  Service: Orthopedics;  Laterality: Left;   WISDOM TOOTH EXTRACTION      Current Outpatient Medications  Medication Sig Dispense Refill  5-Hydroxytryptophan (5-HTP PO) Take 100 mg by mouth at bedtime.     AMBULATORY NON FORMULARY MEDICATION Medication Name: Trimix  PGE 30 mcg Pap 30 mg Phent 1 mg (Patient taking differently: daily as needed (ED). Medication Name: Trimix  PGE 30 mcg Pap 30 mg Phent 1 mg) 5 mL 5   Ascorbic Acid (VITAMIN C) 1000 MG tablet Take 1,000 mg by mouth in the morning.     aspirin EC 81 MG tablet Take 81 mg by mouth in the morning. Swallow whole.     cetirizine (ZYRTEC) 10 MG tablet Take 10 mg by mouth every evening.      Cholecalciferol (VITAMIN D3) 125 MCG (5000 UT) CAPS Take 5,000 Units by mouth in the morning.     COENZYME Q10 PO Take 200 mg by mouth in the morning.     doxycycline (VIBRAMYCIN) 100 MG capsule Take 1 capsule (100 mg total) by  mouth every 12 (twelve) hours for 28 days. 56 capsule 0   Dulaglutide (TRULICITY) 3 MG/0.5ML SOPN Inject 3 mg as directed once a week. **NEEDS TO BE SEEN BEFORE NEXT REFILL** (Patient taking differently: Inject 3 mg as directed every Sunday. **NEEDS TO BE SEEN BEFORE NEXT REFILL**) 2 mL 0   Evolocumab (REPATHA SURECLICK) 140 MG/ML SOAJ Inject 140 mg into the skin every 14 (fourteen) days. (Patient taking differently: Inject 140 mg into the skin every 14 (fourteen) days. Sundays) 6 mL 3   ezetimibe (ZETIA) 10 MG tablet Take 1 tablet (10 mg total) by mouth daily. 90 tablet 3   fexofenadine (ALLEGRA) 180 MG tablet Take 180 mg by mouth every morning.      finasteride (PROSCAR) 5 MG tablet Take 1 tablet (5 mg total) by mouth daily. 90 tablet 3   glipiZIDE (GLUCOTROL) 10 MG tablet Take 10 mg by mouth daily before breakfast.     glucose blood (ONETOUCH ULTRA) test strip check blood sugar once daily. DX:E11.69 100 each 3   ibuprofen (ADVIL) 800 MG tablet Take 800 mg by mouth every 8 (eight) hours as needed (pain.).     Insulin Pen Needle (PEN NEEDLES) 31G X 6 MM MISC 1 each by Does not apply route daily. 100 each 1   LANTUS SOLOSTAR 100 UNIT/ML Solostar Pen Inject 50 Units into the skin at bedtime.     losartan (COZAAR) 50 MG tablet Take 50 mg by mouth daily.     MAGNESIUM BISGLYCINATE PO Take 1 capsule by mouth in the morning.     methocarbamol (ROBAXIN) 500 MG tablet Take 1 tablet (500 mg total) by mouth every 8 (eight) hours as needed for muscle spasms. 30 tablet 1   Multiple Vitamin (MULTIVITAMIN WITH MINERALS) TABS tablet Take 1 tablet by mouth in the morning. Centrum     Omega-3 Fatty Acids (FISH OIL) 1000 MG CAPS Take 1,000 mg by mouth in the morning.     pantoprazole (PROTONIX) 40 MG tablet Take 40 mg by mouth daily.     Probiotic Product (PROBIOTIC PO) Take 1 capsule by mouth in the morning.  Probiotic-10 25 Billion with 10 Probiotic Strains Veggie Capsules     silodosin (RAPAFLO) 8 MG CAPS  capsule Take 1 capsule (8 mg total) by mouth in the morning and at bedtime. 180 capsule 3   sodium chloride (OCEAN) 0.65 % SOLN nasal spray Place 1 spray into both nostrils 2 (two) times daily.     Specialty Vitamins Products (ECHINACEA C COMPLETE PO) Take 1 capsule by mouth daily as  needed (immune health).     SYRINGE-NEEDLE, DISP, 3 ML (LUER LOCK SAFETY SYRINGES) 22G X 1" 3 ML MISC For testosterone  injections 30 each 1   testosterone cypionate (DEPOTESTOSTERONE CYPIONATE) 200 MG/ML injection Inject 100 mg into the muscle every 14 (fourteen) days. For IM use only     traMADol (ULTRAM) 50 MG tablet Take 1 tablet (50 mg total) by mouth every 6 (six) hours as needed. 15 tablet 0   vitamin E 180 MG (400 UNITS) capsule Take 400 Units by mouth in the morning.     ZINC PICOLINATE PO Take 50 mg by mouth in the morning.     No current facility-administered medications for this visit.    Allergies as of 12/22/2023 - Review Complete 12/22/2023  Allergen Reaction Noted   Invokana [canagliflozin] Other (See Comments) 07/28/2015   Statins Other (See Comments) 09/08/2021    Family History  Problem Relation Age of Onset   Diabetes Mother    Lung cancer Father    Heart disease Father    Stroke Father    Cancer Father    Diabetes Sister    Diabetes Other        Siblings    Social History   Socioeconomic History   Marital status: Married    Spouse name: Esau Fridman   Number of children: 2   Years of education: Not on file   Highest education level: Master's degree (e.g., MA, MS, MEng, MEd, MSW, MBA)  Occupational History   Occupation: Retired Runner, broadcasting/film/video but work part-time teaching  Tobacco Use   Smoking status: Former    Current packs/day: 2.50    Average packs/day: 2.5 packs/day for 15.0 years (37.5 ttl pk-yrs)    Types: Cigarettes   Smokeless tobacco: Never  Vaping Use   Vaping status: Never Used  Substance and Sexual Activity   Alcohol use: No    Alcohol/week: 0.0 standard drinks  of alcohol   Drug use: No   Sexual activity: Not on file  Other Topics Concern   Not on file  Social History Narrative   Not on file   Social Drivers of Health   Financial Resource Strain: Patient Declined (12/17/2022)   Overall Financial Resource Strain (CARDIA)    Difficulty of Paying Living Expenses: Patient declined  Food Insecurity: No Food Insecurity (03/08/2023)   Hunger Vital Sign    Worried About Running Out of Food in the Last Year: Never true    Ran Out of Food in the Last Year: Never true  Transportation Needs: No Transportation Needs (03/08/2023)   PRAPARE - Administrator, Civil Service (Medical): No    Lack of Transportation (Non-Medical): No  Physical Activity: Insufficiently Active (12/17/2022)   Exercise Vital Sign    Days of Exercise per Week: 3 days    Minutes of Exercise per Session: 30 min  Stress: No Stress Concern Present (12/17/2022)   [employer] of Occupational Health - Occupational Stress Questionnaire    Feeling of Stress : Not at all  Social Connections: Unknown (12/17/2022)   Social Connection and Isolation Panel [NHANES]    Frequency of Communication with Friends and Family: Patient declined    Frequency of Social Gatherings with Friends and Family: Patient declined    Attends Religious Services: Patient declined    Database administrator or Organizations: Patient declined    Attends Banker Meetings: Not on file    Marital Status: Married  Catering manager Violence: Not At  Risk (03/02/2023)   Humiliation, Afraid, Rape, and Kick questionnaire    Fear of Current or Ex-Partner: No    Emotionally Abused: No    Physically Abused: No    Sexually Abused: No    Subjective: Review of Systems  Constitutional:  Negative for chills and fever.  HENT:  Negative for congestion and hearing loss.   Eyes:  Negative for blurred vision and double vision.  Respiratory:  Negative for cough and shortness of breath.   Cardiovascular:   Negative for chest pain and palpitations.  Gastrointestinal:  Positive for heartburn. Negative for abdominal pain, blood in stool, constipation, diarrhea, melena and vomiting.       Dysphagia  Genitourinary:  Negative for dysuria and urgency.  Musculoskeletal:  Negative for joint pain and myalgias.  Skin:  Negative for itching and rash.  Neurological:  Negative for dizziness and headaches.  Psychiatric/Behavioral:  Negative for depression. The patient is not nervous/anxious.        Objective: BP 138/76 (BP Location: Right Arm, Patient Position: Sitting, Cuff Size: Large)   Pulse 85   Temp (!) 97.3 F (36.3 C) (Oral)   Ht 5\' 11"  (1.803 m)   Wt 274 lb 9.6 oz (124.6 kg)   SpO2 97%   BMI 38.30 kg/m  Physical Exam Constitutional:      Appearance: Normal appearance.  HENT:     Head: Normocephalic and atraumatic.  Eyes:     Extraocular Movements: Extraocular movements intact.     Conjunctiva/sclera: Conjunctivae normal.  Cardiovascular:     Rate and Rhythm: Normal rate and regular rhythm.  Pulmonary:     Effort: Pulmonary effort is normal.     Breath sounds: Normal breath sounds.  Abdominal:     General: Bowel sounds are normal.     Palpations: Abdomen is soft.  Musculoskeletal:        General: Normal range of motion.     Cervical back: Normal range of motion and neck supple.  Skin:    General: Skin is warm.  Neurological:     General: No focal deficit present.     Mental Status: He is alert and oriented to person, place, and time.  Psychiatric:        Mood and Affect: Mood normal.        Behavior: Behavior normal.      Assessment: *Chronic GERD-improved on pantoprazole daily *Esophageal dysphagia-progressively worsening *Food regurgitation  Plan: Will schedule for EGD with possible dilation to evaluate for peptic ulcer disease, esophagitis, gastritis, H. Pylori, duodenitis, or other. Will also evaluate for esophageal stricture, Schatzki's ring, esophageal web or  other.   The risks including infection, bleed, or perforation as well as benefits, limitations, alternatives and imponderables have been reviewed with the patient. Potential for esophageal dilation, biopsy, etc. have also been reviewed.  Questions have been answered. All parties agreeable.  Continue on pantoprazole daily.  May make adjustments pending endoscopic findings.  Patient to avoid tough textures. All meats should be chopped finely. Eat slowly, take small bites, chew thoroughly, and drink plenty of liquids throughout meals. Advised if something were to get hung in patient's esophagus and not come up or go down, they should proceed to the emergency room.   Thank you Dr. Margo Aye for the kind referral. 12/22/2023 2:25 PM   Disclaimer: This note was dictated with voice recognition software. Similar sounding words can inadvertently be transcribed and may not be corrected upon review.

## 2023-12-22 NOTE — H&P (View-Only) (Signed)
 Primary Care Physician:  Benita Stabile, MD Primary Gastroenterologist:  Dr. Marletta Lor  Chief Complaint  Patient presents with   Dysphagia    Food is getting hung up    HPI:   Roger Guerra is a 68 y.o. male who presents to the clinic today by referral from his PCP Dr. Margo Aye for evaluation.  Patient reports he has had chronic acid reflux for many years, historically on esomeprazole, then changed to omeprazole for which he was taking as needed, now on pantoprazole 40 mg daily  for the last 2 weeks.  Reports his reflux is better controlled with the pantoprazole though he is having progressively worsening esophageal dysphagia.  Feels like food will get stuck in his substernal region.  Occasional food regurgitation.  No nausea or vomiting.  No melena hematochezia.  Takes ibuprofen on occasion.  No previous upper endoscopy.  Past Medical History:  Diagnosis Date   Anxiety disorder    Arthritis    Atherosclerosis    Back pain    Cervical radiculopathy    Constipation    Coronary artery calcification seen on CT scan    Depression    Diabetes (HCC)    Erectile dysfunction    GERD (gastroesophageal reflux disease)    Heartburn    History of blood clots    History of DVT (deep vein thrombosis)    History of DVT of lower extremity    Right leg 2002    History of pneumonia    Hyperlipidemia    Hypogonadism in male    Hypopituitarism (HCC)    Joint pain    Myocardial infarction (HCC)    Nerve root and plexus disorder    Obesity    Obstructive sleep apnea    OSA on CPAP    Osteoarthritis    Sciatica    Sleep apnea    SOBOE (shortness of breath on exertion)    Spinal stenosis    Spondylosis    Swelling of both lower extremities    Type 2 diabetes mellitus (HCC)    Vitamin D deficiency     Past Surgical History:  Procedure Laterality Date   BLADDER SURGERY  2015   removal of skin tag near urethra   COLONOSCOPY N/A 12/10/2016   Procedure: COLONOSCOPY;  Surgeon: Corbin Ade, MD;  Location: [facility];  Service: Endoscopy;  Laterality: N/A;  8:30 AM   COLONOSCOPY WITH PROPOFOL N/A 04/29/2022   Procedure: COLONOSCOPY WITH PROPOFOL;  Surgeon: Corbin Ade, MD;  Location: [facility];  Service: Endoscopy;  Laterality: N/A;  8:00am   CYSTOSCOPY WITH INSERTION OF UROLIFT N/A 12/12/2023   Procedure: CYSTOSCOPY WITH INSERTION OF UROLIFT;  Surgeon: Malen Gauze, MD;  Location: [facility];  Service: Urology;  Laterality: N/A;   KNEE ARTHROSCOPY WITH MEDIAL MENISECTOMY Right 08/25/2017   Procedure: Right knee arthroscopy, partial medial menisectomy and debridement;  Surgeon: Jene Every, MD;  Location: [facility];  Service: Orthopedics;  Laterality: Right;  60 mins   POLYPECTOMY  12/10/2016   Procedure: POLYPECTOMY;  Surgeon: Corbin Ade, MD;  Location: [facility];  Service: Endoscopy;;  hepatic flexure   PTOSIS REPAIR Bilateral 02/2022   TONSILLECTOMY     TOTAL KNEE ARTHROPLASTY Left 03/02/2023   Procedure: TOTAL KNEE ARTHROPLASTY;  Surgeon: Jene Every, MD;  Location: [facility];  Service: Orthopedics;  Laterality: Left;   WISDOM TOOTH EXTRACTION      Current Outpatient Medications  Medication Sig Dispense Refill  5-Hydroxytryptophan (5-HTP PO) Take 100 mg by mouth at bedtime.     AMBULATORY NON FORMULARY MEDICATION Medication Name: Trimix  PGE 30 mcg Pap 30 mg Phent 1 mg (Patient taking differently: daily as needed (ED). Medication Name: Trimix  PGE 30 mcg Pap 30 mg Phent 1 mg) 5 mL 5   Ascorbic Acid (VITAMIN C) 1000 MG tablet Take 1,000 mg by mouth in the morning.     aspirin EC 81 MG tablet Take 81 mg by mouth in the morning. Swallow whole.     cetirizine (ZYRTEC) 10 MG tablet Take 10 mg by mouth every evening.      Cholecalciferol (VITAMIN D3) 125 MCG (5000 UT) CAPS Take 5,000 Units by mouth in the morning.     COENZYME Q10 PO Take 200 mg by mouth in the morning.     doxycycline (VIBRAMYCIN) 100 MG capsule Take 1 capsule (100 mg total) by  mouth every 12 (twelve) hours for 28 days. 56 capsule 0   Dulaglutide (TRULICITY) 3 MG/0.5ML SOPN Inject 3 mg as directed once a week. **NEEDS TO BE SEEN BEFORE NEXT REFILL** (Patient taking differently: Inject 3 mg as directed every Sunday. **NEEDS TO BE SEEN BEFORE NEXT REFILL**) 2 mL 0   Evolocumab (REPATHA SURECLICK) 140 MG/ML SOAJ Inject 140 mg into the skin every 14 (fourteen) days. (Patient taking differently: Inject 140 mg into the skin every 14 (fourteen) days. Sundays) 6 mL 3   ezetimibe (ZETIA) 10 MG tablet Take 1 tablet (10 mg total) by mouth daily. 90 tablet 3   fexofenadine (ALLEGRA) 180 MG tablet Take 180 mg by mouth every morning.      finasteride (PROSCAR) 5 MG tablet Take 1 tablet (5 mg total) by mouth daily. 90 tablet 3   glipiZIDE (GLUCOTROL) 10 MG tablet Take 10 mg by mouth daily before breakfast.     glucose blood (ONETOUCH ULTRA) test strip check blood sugar once daily. DX:E11.69 100 each 3   ibuprofen (ADVIL) 800 MG tablet Take 800 mg by mouth every 8 (eight) hours as needed (pain.).     Insulin Pen Needle (PEN NEEDLES) 31G X 6 MM MISC 1 each by Does not apply route daily. 100 each 1   LANTUS SOLOSTAR 100 UNIT/ML Solostar Pen Inject 50 Units into the skin at bedtime.     losartan (COZAAR) 50 MG tablet Take 50 mg by mouth daily.     MAGNESIUM BISGLYCINATE PO Take 1 capsule by mouth in the morning.     methocarbamol (ROBAXIN) 500 MG tablet Take 1 tablet (500 mg total) by mouth every 8 (eight) hours as needed for muscle spasms. 30 tablet 1   Multiple Vitamin (MULTIVITAMIN WITH MINERALS) TABS tablet Take 1 tablet by mouth in the morning. Centrum     Omega-3 Fatty Acids (FISH OIL) 1000 MG CAPS Take 1,000 mg by mouth in the morning.     pantoprazole (PROTONIX) 40 MG tablet Take 40 mg by mouth daily.     Probiotic Product (PROBIOTIC PO) Take 1 capsule by mouth in the morning.  Probiotic-10 25 Billion with 10 Probiotic Strains Veggie Capsules     silodosin (RAPAFLO) 8 MG CAPS  capsule Take 1 capsule (8 mg total) by mouth in the morning and at bedtime. 180 capsule 3   sodium chloride (OCEAN) 0.65 % SOLN nasal spray Place 1 spray into both nostrils 2 (two) times daily.     Specialty Vitamins Products (ECHINACEA C COMPLETE PO) Take 1 capsule by mouth daily as  needed (immune health).     SYRINGE-NEEDLE, DISP, 3 ML (LUER LOCK SAFETY SYRINGES) 22G X 1" 3 ML MISC For testosterone  injections 30 each 1   testosterone cypionate (DEPOTESTOSTERONE CYPIONATE) 200 MG/ML injection Inject 100 mg into the muscle every 14 (fourteen) days. For IM use only     traMADol (ULTRAM) 50 MG tablet Take 1 tablet (50 mg total) by mouth every 6 (six) hours as needed. 15 tablet 0   vitamin E 180 MG (400 UNITS) capsule Take 400 Units by mouth in the morning.     ZINC PICOLINATE PO Take 50 mg by mouth in the morning.     No current facility-administered medications for this visit.    Allergies as of 12/22/2023 - Review Complete 12/22/2023  Allergen Reaction Noted   Invokana [canagliflozin] Other (See Comments) 07/28/2015   Statins Other (See Comments) 09/08/2021    Family History  Problem Relation Age of Onset   Diabetes Mother    Lung cancer Father    Heart disease Father    Stroke Father    Cancer Father    Diabetes Sister    Diabetes Other        Siblings    Social History   Socioeconomic History   Marital status: Married    Spouse name: Esau Fridman   Number of children: 2   Years of education: Not on file   Highest education level: Master's degree (e.g., MA, MS, MEng, MEd, MSW, MBA)  Occupational History   Occupation: Retired Runner, broadcasting/film/video but work part-time teaching  Tobacco Use   Smoking status: Former    Current packs/day: 2.50    Average packs/day: 2.5 packs/day for 15.0 years (37.5 ttl pk-yrs)    Types: Cigarettes   Smokeless tobacco: Never  Vaping Use   Vaping status: Never Used  Substance and Sexual Activity   Alcohol use: No    Alcohol/week: 0.0 standard drinks  of alcohol   Drug use: No   Sexual activity: Not on file  Other Topics Concern   Not on file  Social History Narrative   Not on file   Social Drivers of Health   Financial Resource Strain: Patient Declined (12/17/2022)   Overall Financial Resource Strain (CARDIA)    Difficulty of Paying Living Expenses: Patient declined  Food Insecurity: No Food Insecurity (03/08/2023)   Hunger Vital Sign    Worried About Running Out of Food in the Last Year: Never true    Ran Out of Food in the Last Year: Never true  Transportation Needs: No Transportation Needs (03/08/2023)   PRAPARE - Administrator, Civil Service (Medical): No    Lack of Transportation (Non-Medical): No  Physical Activity: Insufficiently Active (12/17/2022)   Exercise Vital Sign    Days of Exercise per Week: 3 days    Minutes of Exercise per Session: 30 min  Stress: No Stress Concern Present (12/17/2022)   [employer] of Occupational Health - Occupational Stress Questionnaire    Feeling of Stress : Not at all  Social Connections: Unknown (12/17/2022)   Social Connection and Isolation Panel [NHANES]    Frequency of Communication with Friends and Family: Patient declined    Frequency of Social Gatherings with Friends and Family: Patient declined    Attends Religious Services: Patient declined    Database administrator or Organizations: Patient declined    Attends Banker Meetings: Not on file    Marital Status: Married  Catering manager Violence: Not At  Risk (03/02/2023)   Humiliation, Afraid, Rape, and Kick questionnaire    Fear of Current or Ex-Partner: No    Emotionally Abused: No    Physically Abused: No    Sexually Abused: No    Subjective: Review of Systems  Constitutional:  Negative for chills and fever.  HENT:  Negative for congestion and hearing loss.   Eyes:  Negative for blurred vision and double vision.  Respiratory:  Negative for cough and shortness of breath.   Cardiovascular:   Negative for chest pain and palpitations.  Gastrointestinal:  Positive for heartburn. Negative for abdominal pain, blood in stool, constipation, diarrhea, melena and vomiting.       Dysphagia  Genitourinary:  Negative for dysuria and urgency.  Musculoskeletal:  Negative for joint pain and myalgias.  Skin:  Negative for itching and rash.  Neurological:  Negative for dizziness and headaches.  Psychiatric/Behavioral:  Negative for depression. The patient is not nervous/anxious.        Objective: BP 138/76 (BP Location: Right Arm, Patient Position: Sitting, Cuff Size: Large)   Pulse 85   Temp (!) 97.3 F (36.3 C) (Oral)   Ht 5\' 11"  (1.803 m)   Wt 274 lb 9.6 oz (124.6 kg)   SpO2 97%   BMI 38.30 kg/m  Physical Exam Constitutional:      Appearance: Normal appearance.  HENT:     Head: Normocephalic and atraumatic.  Eyes:     Extraocular Movements: Extraocular movements intact.     Conjunctiva/sclera: Conjunctivae normal.  Cardiovascular:     Rate and Rhythm: Normal rate and regular rhythm.  Pulmonary:     Effort: Pulmonary effort is normal.     Breath sounds: Normal breath sounds.  Abdominal:     General: Bowel sounds are normal.     Palpations: Abdomen is soft.  Musculoskeletal:        General: Normal range of motion.     Cervical back: Normal range of motion and neck supple.  Skin:    General: Skin is warm.  Neurological:     General: No focal deficit present.     Mental Status: He is alert and oriented to person, place, and time.  Psychiatric:        Mood and Affect: Mood normal.        Behavior: Behavior normal.      Assessment: *Chronic GERD-improved on pantoprazole daily *Esophageal dysphagia-progressively worsening *Food regurgitation  Plan: Will schedule for EGD with possible dilation to evaluate for peptic ulcer disease, esophagitis, gastritis, H. Pylori, duodenitis, or other. Will also evaluate for esophageal stricture, Schatzki's ring, esophageal web or  other.   The risks including infection, bleed, or perforation as well as benefits, limitations, alternatives and imponderables have been reviewed with the patient. Potential for esophageal dilation, biopsy, etc. have also been reviewed.  Questions have been answered. All parties agreeable.  Continue on pantoprazole daily.  May make adjustments pending endoscopic findings.  Patient to avoid tough textures. All meats should be chopped finely. Eat slowly, take small bites, chew thoroughly, and drink plenty of liquids throughout meals. Advised if something were to get hung in patient's esophagus and not come up or go down, they should proceed to the emergency room.   Thank you Dr. Margo Aye for the kind referral. 12/22/2023 2:25 PM   Disclaimer: This note was dictated with voice recognition software. Similar sounding words can inadvertently be transcribed and may not be corrected upon review.

## 2023-12-22 NOTE — Patient Instructions (Signed)
 We will schedule you for upper endoscopy to further evaluate your symptoms.  I may elect to stretch your esophagus depending on findings.  Continue on pantoprazole daily.  It was very nice meeting you today.  Dr. Marletta Lor

## 2023-12-23 ENCOUNTER — Encounter: Payer: Self-pay | Admitting: *Deleted

## 2023-12-23 ENCOUNTER — Telehealth: Payer: Self-pay | Admitting: *Deleted

## 2023-12-23 ENCOUNTER — Encounter: Payer: Self-pay | Admitting: Internal Medicine

## 2023-12-23 NOTE — Telephone Encounter (Signed)
 Pt called to reschedule his procedure on 12/27/23 due to transportation issues. He has been rescheduled to 12/29/23 at 1:00 pm. Updated instructions sent via mychart.

## 2023-12-26 DIAGNOSIS — G4733 Obstructive sleep apnea (adult) (pediatric): Secondary | ICD-10-CM | POA: Diagnosis not present

## 2023-12-28 NOTE — Progress Notes (Signed)
 Cardiology Office Note:  .   Date:  01/02/2024  ID:  Roger Guerra, DOB 1956/01/18, MRN [id number] PCP: Benita Stabile, MD  [facility] Providers Cardiologist:  Nona Dell, MD    Patient Profile: .      PMH Dyslipidemia Coronary artery disease CT calcium score 11/10/23 CAC score 503 (79th percentile) LM 0, LAD 458, LCx 33, RCA 12.1 Type 2 diabetes mellitus Nuclear stress test 12/2020 Low risk study, normal EF OSA Hypopituitarism DVT Statin intolerance Low vitamin d level  Referred to Advanced Lipid disorder clinic and seen by Dr. Rennis Golden 03/16/21.  He reported intolerance to statins namely rosuvastatin and pravastatin which caused profound myalgias and he developed thrombosis after starting 1 statin which he thought might be related.  His most recent lipid profile at that time revealed total cholesterol 215, triglycerides 111, HDL 38, and LDL 157.  He was hesitant to take another statin. Dr. Rennis Golden reviewed cardiovascular reduction risk benefit of statin therapy. He had been referred to weight loss clinic.  He was felt to have metabolic dyslipidemia with low HDL and elevated LDL he was felt to have metabolic dyslipidemia with low HDL and elevated LDL fitting the metabolic pattern of diabetes and hypopituitarism. He was agreeable to start ezetimibe 10 mg daily and continue to work on weight loss and dietary changes.  Lipids improved but were not at goal with ezetimibe alone.    Attempted to get insurance coverage for [facility] which was denied.  He did start Praluent 75 mg every 2 weeks and continued ezetimibe.  At follow-up visit 04/20/2022 he reported joint pain, this is different from pain associated with statin therapy.  LDL-P now 720 down from 1246, LDL-C 48 down from 97, and triglycerides 68 down from 259.  He was advised to take an ezetimibe holiday to see if this improved joint pain as it was felt Praluent was less likely to cause the symptoms.  Due to change in insurance, we  received a notification 09/2022 that Repatha is now preferred and patient was started on Repatha 140 mg every 2 weeks.  Last clinic visit was with me on 08/31/23 at which time he reported feeling fatigued, especially in the mornings, and occasional chest pain that does not worsen with exertion. Wonders if idue to heartburn. Reports having orthostatic hypotension, which was particularly problematic during a recent knee replacement surgery. He maintains an active lifestyle, walking three times a day and doing mat exercises. He follows a low-carb, high-fat diet, which he finds helps with weight management and blood sugar control. Admits to struggling with discipline at times. Has significant family history of heart disease, with his father having had a quintuple bypass at 68 and his brother having had a significant heart attack three years ago. Prior to his father, none of the males on that side of the family had lived past age 36.  He denied shortness of breath, orthopnea, PND, edema, presyncope, syncope.       History of Present Illness: .   Roger Guerra is a very pleasant 68 y.o. male who is here today for follow-up of CAD. He reports multiple concerns over the past few months including the death of his mother. Also recent infection following urolift implant and is currently on antibiotics. He reports constant fatigue, which he attributes to a combination of obesity, diabetes, and possible heart disease. He also admits to consuming two pots of coffee daily, a habit he has maintained for fifty years. We ordered calcium scoring  for risk stratification and it revealed scan revealed CAC of 504, which concerns him. He denies chest pain or shortness of breath during exercise, but does experience occasional chest pain in the evenings.  No orthopnea, PND, presyncope, or syncope.  He works out 6 days per week which consist of a 10-minute walk, light dumbbells, yoga/mat routine for 25 minutes and walking again for 10  minutes.  He walks up to inclines and has to rest for a bit at the top of the hill. Has struggled with weight gain and diet for many years. Admits to 30 lb weight gain since August or September and has found it difficult to stick to a diet. He is currently on Trulicity for diabetes and has previously tried Ozempic which caused constipation and then diarrhea. He had piercing pain in his right ear and then blood coming out two nights ago.    Discussed the use of AI scribe software for clinical note transcription with the patient, who gave verbal consent to proceed.   ROS: See HPI       Studies Reviewed: .        Risk Assessment/Calculations:             Physical Exam:   VS:  BP 130/76 (BP Location: Left Arm, Patient Position: Sitting, Cuff Size: Large)   Pulse 88   Ht 5\' 11"  (1.803 m)   Wt 274 lb (124.3 kg)   SpO2 95%   BMI 38.22 kg/m    Wt Readings from Last 3 Encounters:  01/02/24 274 lb (124.3 kg)  12/29/23 270 lb (122.5 kg)  12/22/23 274 lb 9.6 oz (124.6 kg)    GEN: Well nourished, well developed in no acute distress NECK: No JVD; No carotid bruits CARDIAC: RRR, no murmurs, rubs, gallops RESPIRATORY:  Clear to auscultation. Expiratory wheezing noted on occasion. No rhonchi  ABDOMEN: Soft, non-tender, non-distended EXTREMITIES:  No edema; No deformity     ASSESSMENT AND PLAN: .    CAD: Coronary CT 11/10/2023 revealed CAC score of 503 (79th percentile) with the bulk of calcium being in LAD.  He is significantly fatigued.  He has left-sided chest pain that occurs mostly in the evenings described as a "pinching" sensation.  He is active with walking, light weight lifting, and mat exercises and experiences shortness of breath at the top of the incline when walking.  We will get cardiac PET/CT to evaluate for ischemia given significant calcium in LAD.  LDL at goal.  Encouraged heart healthy diet avoiding saturated fat, processed foods, simple carbohydrates, and sugar.  Continue  regular physical activity.  Dyslipidemia LDL goal < 70: Lipid panel completed 11/24/2023 with total cholesterol 71, HDL 34, LDL-C 21, and triglycerides 71.  He would like to discontinue ezetimibe to reduce his pill burden. No side effects from Repatha. Continue Repatha every 14 days.  Obesity/Diabetes: A1C 8.0% on 12/08/23. Encouraged him to discuss newest GLP1 agonist, tirzepatide with PCP for potentially better appetite and A1C control. Management per PCP.  Hypertension: BP is well controlled.  Renal function stable on labs completed 11/24/2023.  Continue losartan.  Ear pain: Ears examined per request due to recent bleeding. He has dried blood around cerumen buildup in the ear canal. Otherwise, no acute concerns. Recommended gentle irrigation for cerumen removal.        Disposition: 6 months with me  Signed, Eligha Bridegroom, NP-C

## 2023-12-29 ENCOUNTER — Ambulatory Visit (HOSPITAL_COMMUNITY)

## 2023-12-29 ENCOUNTER — Encounter (HOSPITAL_COMMUNITY)
Admission: RE | Disposition: A | Discharge status: Home / Self Care | Payer: Self-pay | Source: Home / Self Care | Attending: Internal Medicine

## 2023-12-29 ENCOUNTER — Ambulatory Visit (HOSPITAL_COMMUNITY)
Admission: RE | Admit: 2023-12-29 | Discharge: 2023-12-29 | Disposition: A | Discharge status: Home / Self Care | Attending: Internal Medicine | Admitting: Internal Medicine

## 2023-12-29 ENCOUNTER — Encounter (HOSPITAL_COMMUNITY): Payer: Self-pay | Admitting: Internal Medicine

## 2023-12-29 ENCOUNTER — Other Ambulatory Visit: Payer: Self-pay

## 2023-12-29 ENCOUNTER — Ambulatory Visit (HOSPITAL_BASED_OUTPATIENT_CLINIC_OR_DEPARTMENT_OTHER)

## 2023-12-29 DIAGNOSIS — R1310 Dysphagia, unspecified: Secondary | ICD-10-CM

## 2023-12-29 DIAGNOSIS — Z87891 Personal history of nicotine dependence: Secondary | ICD-10-CM

## 2023-12-29 DIAGNOSIS — Z86718 Personal history of other venous thrombosis and embolism: Secondary | ICD-10-CM | POA: Insufficient documentation

## 2023-12-29 DIAGNOSIS — E669 Obesity, unspecified: Secondary | ICD-10-CM | POA: Diagnosis not present

## 2023-12-29 DIAGNOSIS — Z7985 Long-term (current) use of injectable non-insulin antidiabetic drugs: Secondary | ICD-10-CM | POA: Diagnosis not present

## 2023-12-29 DIAGNOSIS — K259 Gastric ulcer, unspecified as acute or chronic, without hemorrhage or perforation: Secondary | ICD-10-CM

## 2023-12-29 DIAGNOSIS — I252 Old myocardial infarction: Secondary | ICD-10-CM | POA: Insufficient documentation

## 2023-12-29 DIAGNOSIS — Z794 Long term (current) use of insulin: Secondary | ICD-10-CM | POA: Diagnosis not present

## 2023-12-29 DIAGNOSIS — K2100 Gastro-esophageal reflux disease with esophagitis, without bleeding: Secondary | ICD-10-CM | POA: Diagnosis not present

## 2023-12-29 DIAGNOSIS — Z7984 Long term (current) use of oral hypoglycemic drugs: Secondary | ICD-10-CM | POA: Insufficient documentation

## 2023-12-29 DIAGNOSIS — K319 Disease of stomach and duodenum, unspecified: Secondary | ICD-10-CM | POA: Diagnosis not present

## 2023-12-29 DIAGNOSIS — E1165 Type 2 diabetes mellitus with hyperglycemia: Secondary | ICD-10-CM

## 2023-12-29 DIAGNOSIS — I2510 Atherosclerotic heart disease of native coronary artery without angina pectoris: Secondary | ICD-10-CM | POA: Diagnosis not present

## 2023-12-29 DIAGNOSIS — E119 Type 2 diabetes mellitus without complications: Secondary | ICD-10-CM | POA: Insufficient documentation

## 2023-12-29 DIAGNOSIS — Z6838 Body mass index (BMI) 38.0-38.9, adult: Secondary | ICD-10-CM | POA: Diagnosis not present

## 2023-12-29 DIAGNOSIS — K2950 Unspecified chronic gastritis without bleeding: Secondary | ICD-10-CM

## 2023-12-29 DIAGNOSIS — K209 Esophagitis, unspecified without bleeding: Secondary | ICD-10-CM | POA: Diagnosis not present

## 2023-12-29 DIAGNOSIS — K2970 Gastritis, unspecified, without bleeding: Secondary | ICD-10-CM | POA: Diagnosis not present

## 2023-12-29 DIAGNOSIS — G4733 Obstructive sleep apnea (adult) (pediatric): Secondary | ICD-10-CM | POA: Diagnosis not present

## 2023-12-29 DIAGNOSIS — K3189 Other diseases of stomach and duodenum: Secondary | ICD-10-CM | POA: Diagnosis not present

## 2023-12-29 HISTORY — PX: ESOPHAGOGASTRODUODENOSCOPY: SHX5428

## 2023-12-29 HISTORY — PX: ESOPHAGEAL DILATION: SHX303

## 2023-12-29 LAB — GLUCOSE, CAPILLARY: Glucose-Capillary: 191 mg/dL — ABNORMAL HIGH (ref 70–99)

## 2023-12-29 SURGERY — EGD (ESOPHAGOGASTRODUODENOSCOPY)
Anesthesia: General

## 2023-12-29 MED ORDER — LIDOCAINE HCL (PF) 2 % IJ SOLN
INTRAMUSCULAR | Status: DC | PRN
  Administered 2023-12-29: 100 mg via INTRADERMAL

## 2023-12-29 MED ORDER — PROPOFOL 10 MG/ML IV BOLUS
INTRAVENOUS | Status: DC | PRN
Start: 2023-12-29 — End: 2023-12-29
  Administered 2023-12-29: 50 mg via INTRAVENOUS
  Administered 2023-12-29: 150 mg via INTRAVENOUS
  Administered 2023-12-29: 50 mg via INTRAVENOUS

## 2023-12-29 MED ORDER — LACTATED RINGERS IV SOLN: INTRAVENOUS | Status: DC | PRN

## 2023-12-29 MED ORDER — PANTOPRAZOLE SODIUM 40 MG PO TBEC
40.0000 mg | DELAYED_RELEASE_TABLET | Freq: Two times a day (BID) | ORAL | 11 refills | Status: AC
Start: 2023-12-29 — End: 2024-12-28

## 2023-12-29 MED ORDER — PHENYLEPHRINE 80 MCG/ML (10ML) SYRINGE FOR IV PUSH (FOR BLOOD PRESSURE SUPPORT)
PREFILLED_SYRINGE | INTRAVENOUS | Status: DC | PRN
Start: 2023-12-29 — End: 2023-12-29
  Administered 2023-12-29: 80 ug via INTRAVENOUS
  Administered 2023-12-29: 160 ug via INTRAVENOUS

## 2023-12-29 NOTE — Anesthesia Postprocedure Evaluation (Signed)
 Anesthesia Post Note  Patient: Roger Guerra  Procedure(s) Performed: EGD (ESOPHAGOGASTRODUODENOSCOPY) DILATION, ESOPHAGUS  Patient location during evaluation: PACU Anesthesia Type: General Level of consciousness: awake and alert Pain management: pain level controlled Vital Signs Assessment: post-procedure vital signs reviewed and stable Respiratory status: spontaneous breathing, nonlabored ventilation, respiratory function stable and patient connected to nasal cannula oxygen Cardiovascular status: stable and blood pressure returned to baseline Postop Assessment: no apparent nausea or vomiting Anesthetic complications: no   There were no known notable events for this encounter.   Last Vitals:  Vitals:   12/29/23 1315 12/29/23 1320  BP: (!) 95/53 (!) 98/57  Pulse: 81 81  Resp: 19 (!) 23  Temp: 36.4 C   SpO2: 92% 93%    Last Pain:  Vitals:   12/29/23 1320  TempSrc:   PainSc: 0-No pain                 [name]

## 2023-12-29 NOTE — Anesthesia Preprocedure Evaluation (Signed)
 Anesthesia Evaluation  Patient identified by MRN, date of birth, ID band Patient awake    Reviewed: Allergy & Precautions, H&P , NPO status , Patient's Chart, lab work & pertinent test results, reviewed documented beta blocker date and time   Airway Mallampati: II  TM Distance: >3 FB Neck ROM: full    Dental  (+) Dental Advisory Given, Missing Most of lower teeth missing and some upper back ones too:   Pulmonary sleep apnea , former smoker   Pulmonary exam normal breath sounds clear to auscultation       Cardiovascular Exercise Tolerance: Good + CAD and + Past MI  Normal cardiovascular exam Rhythm:regular Rate:Normal     Neuro/Psych  PSYCHIATRIC DISORDERS Anxiety Depression    Spinal stenosis  Neuromuscular disease    GI/Hepatic Neg liver ROS,GERD  Medicated,,  Endo/Other  diabetes, Type 2    Renal/GU negative Renal ROS  negative genitourinary   Musculoskeletal  (+) Arthritis , Osteoarthritis,    Abdominal   Peds  Hematology negative hematology ROS (+)   Anesthesia Other Findings   Reproductive/Obstetrics negative OB ROS                             Anesthesia Physical Anesthesia Plan  ASA: 3  Anesthesia Plan: General   Post-op Pain Management: Minimal or no pain anticipated   Induction: Intravenous  PONV Risk Score and Plan: Propofol infusion  Airway Management Planned: Natural Airway and Nasal Cannula  Additional Equipment: None  Intra-op Plan:   Post-operative Plan:   Informed Consent: I have reviewed the patients History and Physical, chart, labs and discussed the procedure including the risks, benefits and alternatives for the proposed anesthesia with the patient or authorized representative who has indicated his/her understanding and acceptance.     Dental Advisory Given  Plan Discussed with: CRNA  Anesthesia Plan Comments:         Anesthesia Quick  Evaluation

## 2023-12-29 NOTE — Interval H&P Note (Signed)
 History and Physical Interval Note:  12/29/2023 12:50 PM  Roger Guerra  has presented today for surgery, with the diagnosis of gerd, dysphagia.  The various methods of treatment have been discussed with the patient and family. After consideration of risks, benefits and other options for treatment, the patient has consented to  Procedure(s) with comments: EGD (ESOPHAGOGASTRODUODENOSCOPY) (N/A) - 11:15am, asa 2 DILATION, ESOPHAGUS (N/A) as a surgical intervention.  The patient's history has been reviewed, patient examined, no change in status, stable for surgery.  I have reviewed the patient's chart and labs.  Questions were answered to the patient's satisfaction.     Lanelle Bal

## 2023-12-29 NOTE — Discharge Instructions (Addendum)
 EGD Discharge instructions Please read the instructions outlined below and refer to this sheet in the next few weeks. These discharge instructions provide you with general information on caring for yourself after you leave the hospital. Your doctor may also give you specific instructions. While your treatment has been planned according to the most current medical practices available, unavoidable complications occasionally occur. If you have any problems or questions after discharge, please call your doctor. ACTIVITY You may resume your regular activity but move at a slower pace for the next 24 hours.  Take frequent rest periods for the next 24 hours.  Walking will help expel (get rid of) the air and reduce the bloated feeling in your abdomen.  No driving for 24 hours (because of the anesthesia (medicine) used during the test).  You may shower.  Do not sign any important legal documents or operate any machinery for 24 hours (because of the anesthesia used during the test).  NUTRITION Drink plenty of fluids.  You may resume your normal diet.  Begin with a light meal and progress to your normal diet.  Avoid alcoholic beverages for 24 hours or as instructed by your caregiver.  MEDICATIONS You may resume your normal medications unless your caregiver tells you otherwise.  WHAT YOU CAN EXPECT TODAY You may experience abdominal discomfort such as a feeling of fullness or "gas" pains.  FOLLOW-UP Your doctor will discuss the results of your test with you.  SEEK IMMEDIATE MEDICAL ATTENTION IF ANY OF THE FOLLOWING OCCUR: Excessive nausea (feeling sick to your stomach) and/or vomiting.  Severe abdominal pain and distention (swelling).  Trouble swallowing.  Temperature over 101 F (37.8 C).  Rectal bleeding or vomiting of blood.   Your upper endoscopy revealed evidence of reflux esophagitis as well as gastritis.  I took samples of both.  Also appears that you have an erosion in your stomach that  looked like an ulcer that has started to heal.  I took samples of this as well.  We will call with these results.  I did stretch your esophagus today.  Hopefully this helps with feeling of food getting stuck.  I am going to increase your pantoprazole to twice daily.  Follow-up in GI office in 3 months. Office will call with appointment   I hope you have a great rest of your week!  Hennie Duos. Marletta Lor, D.O. Gastroenterology and Hepatology [facility]

## 2023-12-29 NOTE — Op Note (Signed)
 [facility] Patient Name: Roger Guerra Procedure Date: 12/29/2023 12:49 PM MRN: [id number] Date of Birth: 1956/08/31 Attending MD: Hennie Duos. Marletta Lor , [place], [id number] CSN: [id number] Age: 68 Admit Type: Outpatient Procedure:                Upper GI endoscopy Indications:              Dysphagia, Heartburn Providers:                Hennie Duos. Marletta Lor, DO, Sheran Fava, Dyann Ruddle Referring MD:              Medicines:                See the Anesthesia note for documentation of the                            administered medications Complications:            No immediate complications. Estimated Blood Loss:     Estimated blood loss was minimal. Procedure:                Pre-Anesthesia Assessment:                           - The anesthesia plan was to use monitored                            anesthesia care (MAC).                           After obtaining informed consent, the endoscope was                            passed under direct vision. Throughout the                            procedure, the patient's blood pressure, pulse, and                            oxygen saturations were monitored continuously. The                            GIF-H190 ([id number]) scope was introduced through the                            mouth, and advanced to the second part of duodenum.                            The upper GI endoscopy was accomplished without                            difficulty. The patient tolerated the procedure                            well. Scope In: 1:04:22 PM Scope Out:  1:11:24 PM Total Procedure Duration: 0 hours 7 minutes 2 seconds  Findings:      LA Grade A (one or more mucosal breaks less than 5 mm, not extending       between tops of 2 mucosal folds) esophagitis with no bleeding was found       at the gastroesophageal junction. Biopsies were taken with a cold       forceps for histology.      No endoscopic abnormality was evident  in the esophagus to explain the       patient's complaint of dysphagia. Preparations were made for empiric       dilation. A TTS dilator was passed through the scope. Dilation with an       18-19-20 mm balloon dilator was performed to 20 mm. Dilation was       performed with a mild resistance at 20 mm. Estimated blood loss was none.      Patchy mild inflammation characterized by erythema was found in the       entire examined stomach. Biopsies were taken with a cold forceps for       Helicobacter pylori testing.      A single 10-79mm erosion with no bleeding and no stigmata of recent       bleeding was found in the gastric body ?healing ulcer (see pictures).       Biopsies were taken with a cold forceps for histology.      The duodenal bulb, first portion of the duodenum and second portion of       the duodenum were normal. Impression:               - LA Grade A reflux esophagitis with no bleeding.                            Biopsied.                           - Gastritis. Biopsied.                           - Gastric erosion with no bleeding and no stigmata                            of recent bleeding. Biopsied.                           - Normal duodenal bulb, first portion of the                            duodenum and second portion of the duodenum. Moderate Sedation:      Per Anesthesia Care Recommendation:           - Patient has a contact number available for                            emergencies. The signs and symptoms of potential                            delayed complications were discussed with the  patient. Return to normal activities tomorrow.                            Written discharge instructions were provided to the                            patient.                           - Resume previous diet.                           - Continue present medications.                           - Await pathology results.                           -  Repeat upper endoscopy PRN for retreatment.                           - Return to GI clinic in 3 months.                           - Use Protonix (pantoprazole) 40 mg PO BID. Procedure Code(s):        --- Professional ---                           [phone], Esophagogastroduodenoscopy, flexible,                            transoral; with biopsy, single or multiple Diagnosis Code(s):        --- Professional ---                           K21.00, Gastro-esophageal reflux disease with                            esophagitis, without bleeding                           K29.70, Gastritis, unspecified, without bleeding                           K25.9, Gastric ulcer, unspecified as acute or                            chronic, without hemorrhage or perforation                           R13.10, Dysphagia, unspecified                           R12, Heartburn CPT copyright 2022 American Medical Association. All rights reserved. The codes documented in this report are preliminary and upon coder review may  be revised to meet current compliance requirements. Hennie Duos. Marletta Lor, DO Hennie Duos. Delayne Sanzo, DO 12/29/2023 1:23:13  PM This report has been signed electronically. Number of Addenda: 0

## 2023-12-29 NOTE — Transfer of Care (Signed)
 Immediate Anesthesia Transfer of Care Note  Patient: Roger Guerra  Procedure(s) Performed: EGD (ESOPHAGOGASTRODUODENOSCOPY) DILATION, ESOPHAGUS  Patient Location: Endoscopy Unit  Anesthesia Type:General  Level of Consciousness: drowsy  Airway & Oxygen Therapy: Patient Spontanous Breathing  Post-op Assessment: Report given to RN and Post -op Vital signs reviewed and stable  Post vital signs: Reviewed and stable  Last Vitals:  Vitals Value Taken Time  BP    Temp    Pulse    Resp    SpO2      Last Pain:  Vitals:   12/29/23 1300  TempSrc:   PainSc: 0-No pain      Patients Stated Pain Goal: 5 (12/29/23 1137)  Complications: No notable events documented.

## 2023-12-29 NOTE — Anesthesia Procedure Notes (Addendum)
 Date/Time: 12/29/2023 1:02 PM  Performed by: Julian Reil, CRNAPre-anesthesia Checklist: Patient identified, Emergency Drugs available, Suction available, Patient being monitored and Timeout performed Patient Re-evaluated:Patient Re-evaluated prior to induction Oxygen Delivery Method: Nasal cannula Induction Type: IV induction Placement Confirmation: positive ETCO2 Comments: Optiflow  High Flow Enetai O2 used.

## 2023-12-30 ENCOUNTER — Encounter (HOSPITAL_COMMUNITY): Payer: Self-pay | Admitting: Internal Medicine

## 2023-12-30 LAB — SURGICAL PATHOLOGY

## 2024-01-02 ENCOUNTER — Telehealth: Payer: Self-pay

## 2024-01-02 ENCOUNTER — Telehealth (HOSPITAL_BASED_OUTPATIENT_CLINIC_OR_DEPARTMENT_OTHER): Payer: Self-pay | Admitting: *Deleted

## 2024-01-02 ENCOUNTER — Encounter (HOSPITAL_BASED_OUTPATIENT_CLINIC_OR_DEPARTMENT_OTHER): Payer: Self-pay | Admitting: Nurse Practitioner

## 2024-01-02 ENCOUNTER — Ambulatory Visit (HOSPITAL_BASED_OUTPATIENT_CLINIC_OR_DEPARTMENT_OTHER): Payer: PPO | Admitting: Nurse Practitioner

## 2024-01-02 ENCOUNTER — Other Ambulatory Visit (HOSPITAL_COMMUNITY): Payer: Self-pay

## 2024-01-02 VITALS — BP 130/76 | HR 88 | Ht 71.0 in | Wt 274.0 lb

## 2024-01-02 DIAGNOSIS — H9201 Otalgia, right ear: Secondary | ICD-10-CM | POA: Diagnosis not present

## 2024-01-02 DIAGNOSIS — I10 Essential (primary) hypertension: Secondary | ICD-10-CM | POA: Diagnosis not present

## 2024-01-02 DIAGNOSIS — I2510 Atherosclerotic heart disease of native coronary artery without angina pectoris: Secondary | ICD-10-CM

## 2024-01-02 DIAGNOSIS — E1165 Type 2 diabetes mellitus with hyperglycemia: Secondary | ICD-10-CM

## 2024-01-02 DIAGNOSIS — E785 Hyperlipidemia, unspecified: Secondary | ICD-10-CM | POA: Diagnosis not present

## 2024-01-02 DIAGNOSIS — E669 Obesity, unspecified: Secondary | ICD-10-CM

## 2024-01-02 NOTE — Telephone Encounter (Signed)
 Pharmacy Patient Advocate Encounter   Received notification from Physician's Office that prior authorization for REPATHA is required/requested.   Insurance verification completed.   The patient is insured through [facility] ADVANTAGE/RX ADVANCE .   Per test claim: PA required; PA submitted to above mentioned insurance via CoverMyMeds Key/confirmation #/EOC [id number] Status is pending

## 2024-01-02 NOTE — Telephone Encounter (Signed)
 Patient here for visit with Eula Fried NP, Repatha needing update PA  Will forward to PA team to initialize

## 2024-01-02 NOTE — Patient Instructions (Addendum)
 Medication Instructions:   STOP ZETIA  *If you need a refill on your cardiac medications before your next appointment, please call your pharmacy*  Lab Work: NONE If you have labs (blood work) drawn today and your tests are completely normal, you will receive your results only by: MyChart Message (if you have MyChart) OR A paper copy in the mail If you have any lab test that is abnormal or we need to change your treatment, we will call you to review the results.  Testing/Procedures: CARDIAC PET SCAN   Follow-Up: At [facility], you and your health needs are our priority.  As part of our continuing mission to provide you with exceptional heart care, our providers are all part of one team.  This team includes your primary Cardiologist (physician) and Advanced Practice Providers or APPs (Physician Assistants and Nurse Practitioners) who all work together to provide you with the care you need, when you need it.  Your next appointment:   6 month(s)  Provider:   Eligha Bridegroom, NP     We recommend signing up for the patient portal called "MyChart".  Sign up information is provided on this After Visit Summary.  MyChart is used to connect with patients for Virtual Visits (Telemedicine).  Patients are able to view lab/test results, encounter notes, upcoming appointments, etc.  Non-urgent messages can be sent to your provider as well.   To learn more about what you can do with MyChart, go to [email or web address].   Other Instructions    Please report to Radiology at the [facility] Main Entrance 30 minutes early for your test.  [address] [place], [place] [address]                         OR   Please report to Radiology at [facility] Main Entrance, medical mall, 30 mins prior to your test.  [address]  [place], [place]  How to Prepare for Your Cardiac PET/CT Stress Test:  Nothing to eat or drink, except water, 3 hours prior  to arrival time.  NO caffeine/decaffeinated products, or chocolate 12 hours prior to arrival. (Please note decaffeinated beverages (teas/coffees) still contain caffeine).  If you have caffeine within 12 hours prior, the test will need to be rescheduled.  Medication instructions: Do not take erectile dysfunction medications for 72 hours prior to test (sildenafil, tadalafil) Do not take nitrates (isosorbide mononitrate, Ranexa) the day before or day of test Do not take tamsulosin the day before or morning of test Hold theophylline containing medications for 12 hours. Hold Dipyridamole 48 hours prior to the test.  Diabetic Preparation: If able to eat breakfast prior to 3 hour fasting, you may take all medications, including your insulin. Do not worry if you miss your breakfast dose of insulin - start at your next meal. If you do not eat prior to 3 hour fast-Hold all diabetes (oral and insulin) medications. Patients who wear a continuous glucose monitor MUST remove the device prior to scanning.  You may take your remaining medications with water.  NO perfume, cologne or lotion on chest or abdomen area. FEMALES - Please avoid wearing dresses to this appointment.  Total time is 1 to 2 hours; you may want to bring reading material for the waiting time.  IF YOU THINK YOU MAY BE PREGNANT, OR ARE NURSING PLEASE INFORM THE TECHNOLOGIST.  In preparation for your appointment, medication and supplies will be  purchased.  Appointment availability is limited, so if you need to cancel or reschedule, please call the Radiology Department Scheduler at [phone] 24 hours in advance to avoid a cancellation fee of $100.00  What to Expect When you Arrive:  Once you arrive and check in for your appointment, you will be taken to a preparation room within the Radiology Department.  A technologist or Nurse will obtain your medical history, verify that you are correctly prepped for the exam, and explain the  procedure.  Afterwards, an IV will be started in your arm and electrodes will be placed on your skin for EKG monitoring during the stress portion of the exam. Then you will be escorted to the PET/CT scanner.  There, staff will get you positioned on the scanner and obtain a blood pressure and EKG.  During the exam, you will continue to be connected to the EKG and blood pressure machines.  A small, safe amount of a radioactive tracer will be injected in your IV to obtain a series of pictures of your heart along with an injection of a stress agent.    After your Exam:  It is recommended that you eat a meal and drink a caffeinated beverage to counter act any effects of the stress agent.  Drink plenty of fluids for the remainder of the day and urinate frequently for the first couple of hours after the exam.  Your doctor will inform you of your test results within 7-10 business days.  For more information and frequently asked questions, please visit our website: [email or web address]  For questions about your test or how to prepare for your test, please call: Cardiac Imaging Nurse Navigators Office: [phone]  Tackling Obesity with Lifestyle Changes  Obesity- What is it? And What can we do about it?  Obesity is a chronic complex disease defined as excessive fat deposits that can have a negative effect on our health. It can lead to many other diseases including type 2 diabetes.  Weight gain occurs when the amount of energy (calories) we consume is greater than the amount we use.  When our energy output is greater than our energy input we lose weight. The basic concept is simple, but in reality, it's much more complicated.  Unfortunately, in some people, our bodies have many ways it can compensate when we try to eat less and move more which can prevent [place] from changing our weight. This can lead to some people having a much more difficult time losing weight even when they put healthy  habits into practice. This can be frustrating. We want to focus on healthy habits, physical activity and how we feel, and less the number on the scale.  Food As Energy  Calories  Calories is just a unit of measurement for energy.  Counting calories is not required to lose weight but counting for a short period of time can:   help you learn good portion sizes   Learn what your true energy needs are.   Help you be more aware of your snacking or grazing habits  To help calculate how many calories you should be eating, the NIH has a great body weight planner calculator at BeverageBuggy.si  Types of Energy Expenditure  Basal Metabolic Rate (BMR) Energy that our bodies use to preform everyday tasks. More muscle mass through resistance training can increase this a small amount  Thermic Effect of Food The amount of energy that it takes to breakdown the food we eat. This will be highest  when we eat protein and fiber rich foods  Exercise Energy Expenditure The amount of energy used during formal exercise (walking, biking, weightlifting)  Non-exercise activity thermogenesis (NEAT) The amount of energy spent on activities that are not formal exercise (standing, fidgeting). Therefore, it is not only important to do formal exercise but also move around throughout the day.  Managing The Meal  Macro nutrients (carbohydrates, fats and protein, fiber, water)  Micronutrients (vitamins, minerals)  Dietary Fiber  Benefits Examples Cautions  Soluble fiber  Decreases cholesterol  improve blood sugar control,  Feeds our gut bacteria  Allows [place] to feel fuller for longer so we eat less  fruits  oats  barley  legumes  peas  Beans  vegetables (broccoli) and root vegetables (carrots) Add fiber into your diet slowly and be sure to drink at least 8 cups of water a day. This will help limit gas, bloating, diarrhea, or constipation.  Insoluble Fiber  Improves digestive health by making  stool easier to pass  Allows [place] to feel fuller for longer so we eat less  whole grains  nuts  seeds  skin of fruit  vegetables (green beans, zucchini, cauliflower)  Tricks to add more fiber to your diet   Add beans (pinto, kidney, lima, navy and garbanzo) to salads, ground meat or brown rice   Add nuts or seeds and or fresh/frozen fruit to yogurt, cottage cheese, salads or steel cut oats   Cut up vegetables and eat with hummus   Look for unsweetened whole grain cereals with at least 5g of fiber per serving   Switch to whole grain bread. Look for bread that has whole grain flour as the first ingredient and has more fiber than carbs if you were to multiple the fiber x 10.   Try bulgar, barely, quinoa, buckwheat, brown rice wild rice instead of white rice   Keep frozen vegetables on hand to add to dishes or soups  Meal Planning:  Meal planning is the key to setting you up for success. Here are some examples of healthy meal options.  Breakfast  Option 1: Omelette with vegetables (1 egg, spinach, mushrooms, or other vegetable of your choice), 2 slices whole-grain toast, tip of thumb size butter or soft margarine,  cup low-fat milk or yogurt  Option 2: steel-cut rolled oats (? cup dry), 1 tbsp peanut butter added to cooked oats,  cup low-fat milk.  Option 3: 2 slices whole-grain or rye toast with avocado spread ( small avocado mased with herbs and pepper to taste), 1 poached egg or [place] up (cooked to your liking)  Option 4:  cup plain 0% [place] yogurt topped with  cup berries and  cup walnuts or almonds, 2 slices whole-grain or rye toast, tip of thumb size soft margarine/butter  Lunch:  Option 1: 2 cups red lentil soup, green salad with 1 tbsp homemade vinaigrette (extra virgin olive oil and vinegar of choice plus spices)  Option 2: 3 oz. roasted chicken, 2 slices whole-grain bread, 2 tsp mayonnaise, mustard, lettuce, tomato if desired, 1 fruit (example:  medium-sized apple or small pear)  Option 3: 3 oz. tuna packed in water, 1 whole-wheat pita (6 inch), 2 tsp mayonnaise, lettuce, tomato, or other non-starchy vegetable of your choice, 1 fruit (example: medium-sized apple or small pear)  Option 4: 1 serving of garden veggie buddha bowl with lentils and tahini sauce and 1 cup berries topped with  cup plain 0% Greek yogurt  Dinner:  Option 1: 1 serving  roasted cauliflower salad, 3-4 oz. grilled or baked pork loin chop, 1/2 cup mashed potato, or brown rice or quinoa  Option 2: 1 serving fish (baked, grilled or air fried), green salad, 1 tbsp homemade vinaigrette,  cup cooked couscous  Option 3: 1 cup cooked whole grained pasta (example: spaghetti, spirals, macaroni),  cup favorite pasta sauce (preferably homemade), 3-4 oz. grilled or baked chicken, green salad, 1 tbsp homemade vinaigrette  Option 4: 1 serving oven roasted salmon,  cup mashed sweet potato or couscous or brown rice or quinoa, broccoli (steamed or roasted)  Healthy snacks:   Carrots or celery with 1 tbsp of hummus   1 medium-sized fruit (apple or orange)   1 cup plain 0% [place] yogurt with  cup berries   Half apple, sliced, with 1 tbsp (15 mL) peanut or almond butter  Dining out:  Eating away from home has become a part of many people's lifestyle. Making healthy choices when you are eating out is important too. Portion size is an important part of healthy choices. Most branded fast-food places provide calories, sodium, and fat content for their menu items. www.calorieking.com would be great resource to find nutrition facts for your favorite brands and fast-food restaurants. Company specific website can be Chief Technology Officer for nutrition information for their items. (e.g. www.mcdonalds.com or www.nutritionix.com/[place]/menu/premium)  Here are some tips to help you make wise food choices when you are dining out.  Chose more often Avoid  Beverages   Choose more  often: Water, low fat milk  Sugar-free/diet drinks  Unsweet tea or coffee    Avoid: Milkshakes, fruit drinks, regular pop  Alcohol, specialty drinks (e.g. iced cappuccino)  Fast food  Choose more often:  Garden salad  Mini subs, pita sandwiches ect with extra vegetables  plain burgers, grilled chicken  Vegetarian or cheese pizza with whole-grain crust    Avoid: Burgers/sandwiches with bacon, cheese, and high-fat sauces  [place] fries, fried chicken, fried fish, poutine, hash browns  Pizza with processed meats  Starters   Choose more often: Raw vegetables, salads (garden, spinach, fruit)  clear or vegetable soups  Seafood cocktail  Whole-grain breads and rolls    Avoid: Salads with high-fat dressings or toppings  Creamy soups  Wings, egg rolls  onion rings, nachos  White or garlic bread  Main courses Grains & Starches (amount equal to  of your plate)  Choose more often:  Oatmeal, high-fiber/lower-sugar cereals  Whole-grain breads, rice, pasta, barley, couscous  Sweet potatoes    Avoid: Sugary, low-fiber cereals  Large bagels, muffins, croissants, white bread  [place] fries, hash browns, fried rice   Meat and alternative (amount equal to  of your plate)  Choose more often:  Lean meats, poultry, fish, eggs, low-fat cheese  Tofu, vegetable protein Legumes (e.g. lentils, chickpeas, beans)    Avoid: High-salt and/or high-fat meats (e.g. ribs, wings, sausages, wieners, processed lunch meats, imposter meats)   Vegetables (amount equal to  of your plate)  Choose more often:  Salads ([place], garden, spinach), plain vegetables   Avoid:  Salads with creamy, high-fat dressings and   Vegetables on sandwiches ect toppings like bacon bits, croutons, cheese  Desserts  Choose more often:  Fresh fruit, frozen yogourt, skim milk latte    Avoid: Cakes, pies, pastries, ice cream, cheesecake

## 2024-01-02 NOTE — Telephone Encounter (Signed)
 PA request has been Submitted. New Encounter has been or will be created for follow up. For additional info see Pharmacy Prior Auth telephone encounter from 01/02/24.

## 2024-01-03 NOTE — Telephone Encounter (Signed)
 Pharmacy Patient Advocate Encounter  Received notification from [facility] ADVANTAGE/RX ADVANCE that Prior Authorization for REPATHA has been APPROVED from 01/02/24 to 07/03/24

## 2024-01-09 DIAGNOSIS — M9905 Segmental and somatic dysfunction of pelvic region: Secondary | ICD-10-CM | POA: Diagnosis not present

## 2024-01-09 DIAGNOSIS — M62830 Muscle spasm of back: Secondary | ICD-10-CM | POA: Diagnosis not present

## 2024-01-09 DIAGNOSIS — M9902 Segmental and somatic dysfunction of thoracic region: Secondary | ICD-10-CM | POA: Diagnosis not present

## 2024-01-09 DIAGNOSIS — M9903 Segmental and somatic dysfunction of lumbar region: Secondary | ICD-10-CM | POA: Diagnosis not present

## 2024-01-09 DIAGNOSIS — M546 Pain in thoracic spine: Secondary | ICD-10-CM | POA: Diagnosis not present

## 2024-01-09 DIAGNOSIS — M9901 Segmental and somatic dysfunction of cervical region: Secondary | ICD-10-CM | POA: Diagnosis not present

## 2024-02-01 DIAGNOSIS — M9903 Segmental and somatic dysfunction of lumbar region: Secondary | ICD-10-CM | POA: Diagnosis not present

## 2024-02-01 DIAGNOSIS — M62830 Muscle spasm of back: Secondary | ICD-10-CM | POA: Diagnosis not present

## 2024-02-01 DIAGNOSIS — M546 Pain in thoracic spine: Secondary | ICD-10-CM | POA: Diagnosis not present

## 2024-02-01 DIAGNOSIS — M9905 Segmental and somatic dysfunction of pelvic region: Secondary | ICD-10-CM | POA: Diagnosis not present

## 2024-02-01 DIAGNOSIS — M9901 Segmental and somatic dysfunction of cervical region: Secondary | ICD-10-CM | POA: Diagnosis not present

## 2024-02-01 DIAGNOSIS — M9902 Segmental and somatic dysfunction of thoracic region: Secondary | ICD-10-CM | POA: Diagnosis not present

## 2024-02-07 ENCOUNTER — Encounter: Payer: Self-pay | Admitting: Urology

## 2024-02-07 ENCOUNTER — Telehealth: Admitting: Urology

## 2024-02-07 ENCOUNTER — Ambulatory Visit: Admitting: Urology

## 2024-02-07 DIAGNOSIS — N401 Enlarged prostate with lower urinary tract symptoms: Secondary | ICD-10-CM | POA: Diagnosis not present

## 2024-02-07 DIAGNOSIS — N138 Other obstructive and reflux uropathy: Secondary | ICD-10-CM | POA: Diagnosis not present

## 2024-02-07 NOTE — Progress Notes (Signed)
 Name: Roger Guerra DOB: 10-Feb-1956 MRN: [id number]  This visit was conducted virtually. All issues noted in this document were discussed and addressed.  A physical exam was not performed with this format.  Thackery Labrum's identity was verified using two patient identifiers.  Patient's location at the time of today's visit: at home in the state of [place] .  Provider's location at the time of today's visit: home in the state of [place] .  Roger Guerra was advised of the limitations, risks, security, and privacy concerns of performing medical evaluation and management services by video and the alternative option of scheduling an in-person appointment. Patient was advised that there may be charges related to telemedicine visit. The patient expressed understanding and agreed to proceed.  History of Present Illness: Roger Guerra is a 67 y.o. male who presents today for follow up visit at [facility].  GU History includes: 1. BPH with BOO & LUTS (intermittent urine stream, nocturia x1, terminal dribbling, incomplete bladder emptying). - 12/12/2023: Urolift procedure by Dr. Claretta Croft.  2. Hypogonadism.  - Does testosterone  replacement therapy.  At last visit on 12/14/2023: - Passed postop voiding trial s/p Urolift procedure on 12/12/2023.  - Advised the following: Foley catheter discontinued. Minimize caffeine intake. Continue Rapaflo  (Silodosin ) 8 mg BID and Proscar  (Finasteride ) 5 mg daily for now. May trial tapering down / off Silodosin  starting next week. Return in about 8 weeks (around 02/08/2024) for f/u with Dr. Claretta Croft.  Today: He reports that his urinary symptoms are continuing to improve. He discontinued Silodosin  a few weeks ago and discontinued Proscar  last week. He has noticed improved sexual function since coming off those medications.   He reports decreased urinary urgency and frequency. Denies dysuria, gross hematuria, or hesitancy. Reports  variable urinary stream, some terminal dribbling, and some straining to empty completely. Finds some benefit with applying pressure to his perineum.   He reports alternating constipation and diarrhea which he attributes to some of his diabetic medications.    Medications: Current Outpatient Medications  Medication Sig Dispense Refill   5-Hydroxytryptophan (5-HTP PO) Take 100 mg by mouth at bedtime.     AMBULATORY NON FORMULARY MEDICATION Medication Name: Trimix  PGE 30 mcg Pap 30 mg Phent 1 mg (Patient taking differently: daily as needed (ED). Medication Name: Trimix  PGE 30 mcg Pap 30 mg Phent 1 mg) 5 mL 5   Ascorbic Acid (VITAMIN C) 1000 MG tablet Take 1,000 mg by mouth in the morning.     aspirin EC 81 MG tablet Take 81 mg by mouth in the morning. Swallow whole.     cetirizine (ZYRTEC) 10 MG tablet Take 10 mg by mouth every evening.      Cholecalciferol  (VITAMIN D3) 125 MCG (5000 UT) CAPS Take 5,000 Units by mouth in the morning.     COENZYME Q10 PO Take 200 mg by mouth in the morning.     Dulaglutide  (TRULICITY ) 3 MG/0.5ML SOPN Inject 3 mg as directed once a week. **NEEDS TO BE SEEN BEFORE NEXT REFILL** (Patient taking differently: Inject 3 mg as directed every Sunday. **NEEDS TO BE SEEN BEFORE NEXT REFILL**) 2 mL 0   Evolocumab  (REPATHA  SURECLICK) 140 MG/ML SOAJ Inject 140 mg into the skin every 14 (fourteen) days. (Patient taking differently: Inject 140 mg into the skin every 14 (fourteen) days. Sundays) 6 mL 3   fexofenadine (ALLEGRA) 180 MG tablet Take 180 mg by mouth every morning.      glipiZIDE  (GLUCOTROL ) 10 MG tablet  Take 10 mg by mouth daily before breakfast.     glucose blood (ONETOUCH ULTRA) test strip check blood sugar once daily. DX:E11.69 100 each 3   ibuprofen  (ADVIL ) 800 MG tablet Take 800 mg by mouth every 8 (eight) hours as needed (pain.).     Insulin  Pen Needle (PEN NEEDLES) 31G X 6 MM MISC 1 each by Does not apply route daily. 100 each 1   LANTUS  SOLOSTAR 100  UNIT/ML Solostar Pen Inject 50 Units into the skin at bedtime.     losartan (COZAAR) 50 MG tablet Take 50 mg by mouth daily.     MAGNESIUM  BISGLYCINATE PO Take 1 capsule by mouth in the morning.     methocarbamol  (ROBAXIN ) 500 MG tablet Take 1 tablet (500 mg total) by mouth every 8 (eight) hours as needed for muscle spasms. 30 tablet 1   Multiple Vitamin (MULTIVITAMIN WITH MINERALS) TABS tablet Take 1 tablet by mouth in the morning. Centrum     Omega-3 Fatty Acids (FISH OIL) 1000 MG CAPS Take 1,000 mg by mouth in the morning.     pantoprazole  (PROTONIX ) 40 MG tablet Take 1 tablet (40 mg total) by mouth 2 (two) times daily. 60 tablet 11   Probiotic Product (PROBIOTIC PO) Take 1 capsule by mouth in the morning.  Probiotic-10 25 Billion with 10 Probiotic Strains Veggie Capsules     sodium chloride  (OCEAN) 0.65 % SOLN nasal spray Place 1 spray into both nostrils 2 (two) times daily.     Specialty Vitamins Products (ECHINACEA C COMPLETE PO) Take 1 capsule by mouth daily as needed (immune health).     SYRINGE-NEEDLE, DISP, 3 ML (LUER LOCK SAFETY SYRINGES) 22G X 1" 3 ML MISC For testosterone   injections 30 each 1   testosterone  cypionate (DEPOTESTOSTERONE CYPIONATE) 200 MG/ML injection Inject 100 mg into the muscle every 14 (fourteen) days. For IM use only     traMADol  (ULTRAM ) 50 MG tablet Take 1 tablet (50 mg total) by mouth every 6 (six) hours as needed. 15 tablet 0   vitamin E 180 MG (400 UNITS) capsule Take 400 Units by mouth in the morning.     ZINC  PICOLINATE PO Take 50 mg by mouth in the morning.     No current facility-administered medications for this visit.    Allergies: Allergies  Allergen Reactions   Invokana [Canagliflozin] Other (See Comments)    Yeast infection   Statins Other (See Comments)    Severe muscle cramps    Past Medical History:  Diagnosis Date   Anxiety disorder    Arthritis    Atherosclerosis    Back pain    Cervical radiculopathy    Constipation    Coronary  artery calcification seen on CT scan    Depression    Diabetes (HCC)    Erectile dysfunction    GERD (gastroesophageal reflux disease)    Heartburn    History of blood clots    History of DVT (deep vein thrombosis)    History of DVT of lower extremity    Right leg 2002    History of pneumonia    Hyperlipidemia    Hypogonadism in male    Hypopituitarism (HCC)    Joint pain    Myocardial infarction (HCC)    Nerve root and plexus disorder    Obesity    Obstructive sleep apnea    OSA on CPAP    Osteoarthritis    Sciatica    Sleep apnea    SOBOE (  shortness of breath on exertion)    Spinal stenosis    Spondylosis    Swelling of both lower extremities    Type 2 diabetes mellitus (HCC)    Vitamin D  deficiency    Past Surgical History:  Procedure Laterality Date   BLADDER SURGERY  2015   removal of skin tag near urethra   COLONOSCOPY N/A 12/10/2016   Procedure: COLONOSCOPY;  Surgeon: Suzette Espy, MD;  Location: [facility];  Service: Endoscopy;  Laterality: N/A;  8:30 AM   COLONOSCOPY WITH PROPOFOL  N/A 04/29/2022   Procedure: COLONOSCOPY WITH PROPOFOL ;  Surgeon: Suzette Espy, MD;  Location: [facility];  Service: Endoscopy;  Laterality: N/A;  8:00am   CYSTOSCOPY WITH INSERTION OF UROLIFT N/A 12/12/2023   Procedure: CYSTOSCOPY WITH INSERTION OF UROLIFT;  Surgeon: Marco Severs, MD;  Location: [facility];  Service: Urology;  Laterality: N/A;   ESOPHAGEAL DILATION N/A 12/29/2023   Procedure: DILATION, ESOPHAGUS;  Surgeon: Vinetta Greening, DO;  Location: [facility];  Service: Endoscopy;  Laterality: N/A;   ESOPHAGOGASTRODUODENOSCOPY N/A 12/29/2023   Procedure: EGD (ESOPHAGOGASTRODUODENOSCOPY);  Surgeon: Vinetta Greening, DO;  Location: [facility];  Service: Endoscopy;  Laterality: N/A;  11:15am, asa 2   KNEE ARTHROSCOPY WITH MEDIAL MENISECTOMY Right 08/25/2017   Procedure: Right knee arthroscopy, partial medial menisectomy and debridement;  Surgeon: Orvan Blanch, MD;   Location: [facility];  Service: Orthopedics;  Laterality: Right;  60 mins   POLYPECTOMY  12/10/2016   Procedure: POLYPECTOMY;  Surgeon: Suzette Espy, MD;  Location: [facility];  Service: Endoscopy;;  hepatic flexure   PTOSIS REPAIR Bilateral 02/2022   TONSILLECTOMY     TOTAL KNEE ARTHROPLASTY Left 03/02/2023   Procedure: TOTAL KNEE ARTHROPLASTY;  Surgeon: Orvan Blanch, MD;  Location: [facility];  Service: Orthopedics;  Laterality: Left;   WISDOM TOOTH EXTRACTION     Family History  Problem Relation Age of Onset   Diabetes Mother    Lung cancer Father    Heart disease Father    Stroke Father    Cancer Father    Diabetes Sister    Diabetes Other        Siblings   Social History   Socioeconomic History   Marital status: Married    Spouse name: Jagger Fadley   Number of children: 2   Years of education: Not on file   Highest education level: Master's degree (e.g., MA, MS, MEng, MEd, MSW, MBA)  Occupational History   Occupation: Retired Runner, broadcasting/film/video but work part-time teaching  Tobacco Use   Smoking status: Former    Current packs/day: 2.50    Average packs/day: 2.5 packs/day for 15.0 years (37.5 ttl pk-yrs)    Types: Cigarettes   Smokeless tobacco: Never  Vaping Use   Vaping status: Never Used  Substance and Sexual Activity   Alcohol use: No    Alcohol/week: 0.0 standard drinks of alcohol   Drug use: No   Sexual activity: Not on file  Other Topics Concern   Not on file  Social History Narrative   Not on file   Social Drivers of Health   Financial Resource Strain: Patient Declined (12/17/2022)   Overall Financial Resource Strain (CARDIA)    Difficulty of Paying Living Expenses: Patient declined  Food Insecurity: No Food Insecurity (03/08/2023)   Hunger Vital Sign    Worried About Running Out of Food in the Last Year: Never true    Ran Out of Food in the  Last Year: Never true  Transportation Needs: No Transportation Needs (03/08/2023)   PRAPARE - Scientist, research (physical sciences) (Medical): No    Lack of Transportation (Non-Medical): No  Physical Activity: Insufficiently Active (12/17/2022)   Exercise Vital Sign    Days of Exercise per Week: 3 days    Minutes of Exercise per Session: 30 min  Stress: No Stress Concern Present (12/17/2022)   [employer] of Occupational Health - Occupational Stress Questionnaire    Feeling of Stress : Not at all  Social Connections: Unknown (12/17/2022)   Social Connection and Isolation Panel [NHANES]    Frequency of Communication with Friends and Family: Patient declined    Frequency of Social Gatherings with Friends and Family: Patient declined    Attends Religious Services: Patient declined    Database administrator or Organizations: Patient declined    Attends Banker Meetings: Not on file    Marital Status: Married  Intimate Partner Violence: Not At Risk (03/02/2023)   Humiliation, Afraid, Rape, and Kick questionnaire    Fear of Current or Ex-Partner: No    Emotionally Abused: No    Physically Abused: No    Sexually Abused: No    Review of Systems Constitutional: Patient denies any unintentional weight loss or change in strength lntegumentary: Patient denies any rashes or pruritus Cardiovascular: Patient denies chest pain or syncope Respiratory: Patient denies shortness of breath Gastrointestinal: As per HPI Musculoskeletal: Patient denies muscle cramps or weakness Neurologic: Patient denies convulsions or seizures Psychiatric: Patient denies memory problems Hematologic/Lymphatic: Patient denies bleeding tendencies  GU: As per HPI.  OBJECTIVE There were no vitals filed for this visit. There is no height or weight on file to calculate BMI.  Physical Examination - Limited due to telemedicine encounter Constitutional: No obvious distress; patient is non-toxic appearing  Respiratory: The patient does not have audible wheezing/stridor; respirations do not appear labored  Musculoskeletal:  Normal ROM of UEs  Skin: No obvious rashes/open sores  Neurologic: CN 2-12 grossly intact Psychiatric: Answered questions appropriately with normal affect   ASSESSMENT Benign prostatic hyperplasia with urinary obstruction  Discussed how constipation can contribute to voiding dysfunction; advised constipation management with OTC stool softeners / laxatives as needed.   For mild LUTS including terminal dribbling, advised double voiding and discussed option to restart Rapaflo  (Silodosin ) or to trial Flomax  (Tamsulosin ), which may have lower risk for sexual side effects.   Ultimately we agreed to plan for a nurse visit in 1-2 weeks for PVR check. As long as he is emptying his bladder adequately and his LUTS remain tolerable, will plan for follow up with urology provider in 6 months.   Patient verbalized understanding of and agreement with current plan. All questions were answered.  PLAN Advised the following: 1. Double voiding.  2. Constipation management. 3. Return in about 1 week (around 02/14/2024) for nurse visit for PVR check.  No orders of the defined types were placed in this encounter.  Total time spent caring for the patient today was over 30 minutes. This includes time spent on the date of the visit reviewing the patient's chart before the visit, time spent during the visit, and time spent after the visit on documentation. Over 50% of that time was spent in face-to-face time with this patient for direct counseling. E&M based on time and complexity of medical decision making.  It has been explained that the patient is to follow regularly with their PCP in addition to all  other providers involved in their care and to follow instructions provided by these respective offices. Patient advised to contact urology clinic if any urologic-pertaining questions, concerns, new symptoms or problems arise in the interim period.  There are no Patient Instructions on file for this  visit.  Electronically signed by:  Lauretta Ponto, MSN, FNP-C, CUNP 02/07/2024 11:00 AM

## 2024-02-09 DIAGNOSIS — M25562 Pain in left knee: Secondary | ICD-10-CM | POA: Diagnosis not present

## 2024-02-13 DIAGNOSIS — M5412 Radiculopathy, cervical region: Secondary | ICD-10-CM | POA: Diagnosis not present

## 2024-02-21 ENCOUNTER — Telehealth: Payer: Self-pay

## 2024-02-21 ENCOUNTER — Ambulatory Visit (INDEPENDENT_AMBULATORY_CARE_PROVIDER_SITE_OTHER)

## 2024-02-21 DIAGNOSIS — N138 Other obstructive and reflux uropathy: Secondary | ICD-10-CM

## 2024-02-21 DIAGNOSIS — N401 Enlarged prostate with lower urinary tract symptoms: Secondary | ICD-10-CM

## 2024-02-21 LAB — BLADDER SCAN AMB NON-IMAGING: Scan Result: 117

## 2024-02-21 NOTE — Telephone Encounter (Signed)
 Patient came in today for PVR check (117). Patient would like to know if you want to proceed with Rapaflo . Patient is aware a message will be sent to Sarah on advisement.

## 2024-02-21 NOTE — Progress Notes (Cosign Needed Addendum)
 Bladder Scan completed today.  Patient can void prior to the bladder scan. Bladder scan result: 117  Performed By: Gorden Latino, CMA  Additional notes-

## 2024-02-22 DIAGNOSIS — M9903 Segmental and somatic dysfunction of lumbar region: Secondary | ICD-10-CM | POA: Diagnosis not present

## 2024-02-22 DIAGNOSIS — M9902 Segmental and somatic dysfunction of thoracic region: Secondary | ICD-10-CM | POA: Diagnosis not present

## 2024-02-22 DIAGNOSIS — M9905 Segmental and somatic dysfunction of pelvic region: Secondary | ICD-10-CM | POA: Diagnosis not present

## 2024-02-22 DIAGNOSIS — M546 Pain in thoracic spine: Secondary | ICD-10-CM | POA: Diagnosis not present

## 2024-02-22 DIAGNOSIS — M9901 Segmental and somatic dysfunction of cervical region: Secondary | ICD-10-CM | POA: Diagnosis not present

## 2024-02-22 DIAGNOSIS — M62830 Muscle spasm of back: Secondary | ICD-10-CM | POA: Diagnosis not present

## 2024-02-22 MED ORDER — SILODOSIN 8 MG PO CAPS: 8.0000 mg | ORAL_CAPSULE | Freq: Every day | ORAL | 3 refills | Status: DC

## 2024-02-22 NOTE — Telephone Encounter (Addendum)
 Patient is made aware and voiced understanding. "OK to proceed without Rapaflo  unless his LUTS are bothersome. Continue to avoid constipation. " Rx Rapaflo  sent to patient's pharmacy.

## 2024-02-23 DIAGNOSIS — M5412 Radiculopathy, cervical region: Secondary | ICD-10-CM | POA: Diagnosis not present

## 2024-03-06 ENCOUNTER — Encounter: Payer: Self-pay | Admitting: Internal Medicine

## 2024-03-07 DIAGNOSIS — E559 Vitamin D deficiency, unspecified: Secondary | ICD-10-CM | POA: Diagnosis not present

## 2024-03-07 DIAGNOSIS — I10 Essential (primary) hypertension: Secondary | ICD-10-CM | POA: Diagnosis not present

## 2024-03-07 DIAGNOSIS — M9902 Segmental and somatic dysfunction of thoracic region: Secondary | ICD-10-CM | POA: Diagnosis not present

## 2024-03-07 DIAGNOSIS — M5412 Radiculopathy, cervical region: Secondary | ICD-10-CM | POA: Diagnosis not present

## 2024-03-07 DIAGNOSIS — M9901 Segmental and somatic dysfunction of cervical region: Secondary | ICD-10-CM | POA: Diagnosis not present

## 2024-03-07 DIAGNOSIS — M9903 Segmental and somatic dysfunction of lumbar region: Secondary | ICD-10-CM | POA: Diagnosis not present

## 2024-03-07 DIAGNOSIS — E1169 Type 2 diabetes mellitus with other specified complication: Secondary | ICD-10-CM | POA: Diagnosis not present

## 2024-03-07 DIAGNOSIS — M546 Pain in thoracic spine: Secondary | ICD-10-CM | POA: Diagnosis not present

## 2024-03-07 DIAGNOSIS — R7989 Other specified abnormal findings of blood chemistry: Secondary | ICD-10-CM | POA: Diagnosis not present

## 2024-03-13 ENCOUNTER — Encounter: Payer: Self-pay | Admitting: Internal Medicine

## 2024-03-13 DIAGNOSIS — M9903 Segmental and somatic dysfunction of lumbar region: Secondary | ICD-10-CM | POA: Diagnosis not present

## 2024-03-13 DIAGNOSIS — G4733 Obstructive sleep apnea (adult) (pediatric): Secondary | ICD-10-CM | POA: Diagnosis not present

## 2024-03-13 DIAGNOSIS — M5440 Lumbago with sciatica, unspecified side: Secondary | ICD-10-CM | POA: Diagnosis not present

## 2024-03-13 DIAGNOSIS — M5030 Other cervical disc degeneration, unspecified cervical region: Secondary | ICD-10-CM | POA: Diagnosis not present

## 2024-03-13 DIAGNOSIS — K2100 Gastro-esophageal reflux disease with esophagitis, without bleeding: Secondary | ICD-10-CM | POA: Diagnosis not present

## 2024-03-13 DIAGNOSIS — E1169 Type 2 diabetes mellitus with other specified complication: Secondary | ICD-10-CM | POA: Diagnosis not present

## 2024-03-13 DIAGNOSIS — E782 Mixed hyperlipidemia: Secondary | ICD-10-CM | POA: Diagnosis not present

## 2024-03-13 DIAGNOSIS — Z86718 Personal history of other venous thrombosis and embolism: Secondary | ICD-10-CM | POA: Diagnosis not present

## 2024-03-13 DIAGNOSIS — M9901 Segmental and somatic dysfunction of cervical region: Secondary | ICD-10-CM | POA: Diagnosis not present

## 2024-03-13 DIAGNOSIS — N401 Enlarged prostate with lower urinary tract symptoms: Secondary | ICD-10-CM | POA: Diagnosis not present

## 2024-03-13 DIAGNOSIS — M9902 Segmental and somatic dysfunction of thoracic region: Secondary | ICD-10-CM | POA: Diagnosis not present

## 2024-03-13 DIAGNOSIS — I2510 Atherosclerotic heart disease of native coronary artery without angina pectoris: Secondary | ICD-10-CM | POA: Diagnosis not present

## 2024-03-13 DIAGNOSIS — M170 Bilateral primary osteoarthritis of knee: Secondary | ICD-10-CM | POA: Diagnosis not present

## 2024-03-13 DIAGNOSIS — R7989 Other specified abnormal findings of blood chemistry: Secondary | ICD-10-CM | POA: Diagnosis not present

## 2024-03-13 DIAGNOSIS — M5412 Radiculopathy, cervical region: Secondary | ICD-10-CM | POA: Diagnosis not present

## 2024-03-13 DIAGNOSIS — M546 Pain in thoracic spine: Secondary | ICD-10-CM | POA: Diagnosis not present

## 2024-03-13 DIAGNOSIS — I10 Essential (primary) hypertension: Secondary | ICD-10-CM | POA: Diagnosis not present

## 2024-03-14 DIAGNOSIS — L814 Other melanin hyperpigmentation: Secondary | ICD-10-CM | POA: Diagnosis not present

## 2024-03-14 DIAGNOSIS — L821 Other seborrheic keratosis: Secondary | ICD-10-CM | POA: Diagnosis not present

## 2024-03-14 DIAGNOSIS — D1801 Hemangioma of skin and subcutaneous tissue: Secondary | ICD-10-CM | POA: Diagnosis not present

## 2024-03-14 DIAGNOSIS — L570 Actinic keratosis: Secondary | ICD-10-CM | POA: Diagnosis not present

## 2024-03-26 ENCOUNTER — Encounter (HOSPITAL_COMMUNITY): Payer: Self-pay

## 2024-03-26 DIAGNOSIS — M5412 Radiculopathy, cervical region: Secondary | ICD-10-CM | POA: Diagnosis not present

## 2024-03-26 DIAGNOSIS — M9901 Segmental and somatic dysfunction of cervical region: Secondary | ICD-10-CM | POA: Diagnosis not present

## 2024-03-26 DIAGNOSIS — M9902 Segmental and somatic dysfunction of thoracic region: Secondary | ICD-10-CM | POA: Diagnosis not present

## 2024-03-26 DIAGNOSIS — M546 Pain in thoracic spine: Secondary | ICD-10-CM | POA: Diagnosis not present

## 2024-03-26 DIAGNOSIS — M9903 Segmental and somatic dysfunction of lumbar region: Secondary | ICD-10-CM | POA: Diagnosis not present

## 2024-03-27 DIAGNOSIS — G4733 Obstructive sleep apnea (adult) (pediatric): Secondary | ICD-10-CM | POA: Diagnosis not present

## 2024-03-28 ENCOUNTER — Encounter (HOSPITAL_COMMUNITY)
Admission: RE | Admit: 2024-03-28 | Discharge: 2024-03-28 | Disposition: A | Discharge status: Home / Self Care | Payer: Self-pay | Source: Ambulatory Visit | Attending: Nurse Practitioner | Admitting: Nurse Practitioner

## 2024-03-28 DIAGNOSIS — I2510 Atherosclerotic heart disease of native coronary artery without angina pectoris: Secondary | ICD-10-CM | POA: Diagnosis not present

## 2024-03-28 LAB — NM PET CT CARDIAC PERFUSION MULTI W/ABSOLUTE BLOODFLOW
MBFR: 1.79
Nuc Stress EF: 55 %
Rest MBF: 0.71 ml/g/min
ST Depression (mm): 0 mm
Stress MBF: 1.27 ml/g/min
TID: 1.17

## 2024-03-28 MED ORDER — REGADENOSON 0.4 MG/5ML IV SOLN
INTRAVENOUS | Status: AC
Start: 2024-03-28 — End: 2024-03-28
  Filled 2024-03-28: qty 5

## 2024-03-28 MED ORDER — REGADENOSON 0.4 MG/5ML IV SOLN
0.4000 mg | Freq: Once | INTRAVENOUS | Status: AC
  Administered 2024-03-28: 0.4 mg via INTRAVENOUS

## 2024-03-28 MED ORDER — RUBIDIUM RB82 GENERATOR (RUBYFILL)
26.9900 | PACK | Freq: Once | INTRAVENOUS | Status: AC
  Administered 2024-03-28: 26.99 via INTRAVENOUS

## 2024-03-28 MED ORDER — RUBIDIUM RB82 GENERATOR (RUBYFILL)
26.6300 | PACK | Freq: Once | INTRAVENOUS | Status: AC
  Administered 2024-03-28: 26.63 via INTRAVENOUS

## 2024-03-29 ENCOUNTER — Ambulatory Visit: Payer: Self-pay | Admitting: Nurse Practitioner

## 2024-04-09 DIAGNOSIS — M9901 Segmental and somatic dysfunction of cervical region: Secondary | ICD-10-CM | POA: Diagnosis not present

## 2024-04-09 DIAGNOSIS — M5412 Radiculopathy, cervical region: Secondary | ICD-10-CM | POA: Diagnosis not present

## 2024-04-09 DIAGNOSIS — M9903 Segmental and somatic dysfunction of lumbar region: Secondary | ICD-10-CM | POA: Diagnosis not present

## 2024-04-09 DIAGNOSIS — M9902 Segmental and somatic dysfunction of thoracic region: Secondary | ICD-10-CM | POA: Diagnosis not present

## 2024-04-09 DIAGNOSIS — M546 Pain in thoracic spine: Secondary | ICD-10-CM | POA: Diagnosis not present

## 2024-04-10 ENCOUNTER — Encounter: Payer: Self-pay | Admitting: Cardiology

## 2024-04-10 ENCOUNTER — Ambulatory Visit: Attending: Cardiology | Admitting: Cardiology

## 2024-04-10 VITALS — BP 134/88 | HR 87 | Resp 16 | Ht 71.0 in | Wt 277.4 lb

## 2024-04-10 DIAGNOSIS — M791 Myalgia, unspecified site: Secondary | ICD-10-CM | POA: Diagnosis not present

## 2024-04-10 DIAGNOSIS — Z794 Long term (current) use of insulin: Secondary | ICD-10-CM | POA: Diagnosis not present

## 2024-04-10 DIAGNOSIS — I2584 Coronary atherosclerosis due to calcified coronary lesion: Secondary | ICD-10-CM

## 2024-04-10 DIAGNOSIS — I10 Essential (primary) hypertension: Secondary | ICD-10-CM

## 2024-04-10 DIAGNOSIS — R072 Precordial pain: Secondary | ICD-10-CM

## 2024-04-10 DIAGNOSIS — Z8249 Family history of ischemic heart disease and other diseases of the circulatory system: Secondary | ICD-10-CM | POA: Diagnosis not present

## 2024-04-10 DIAGNOSIS — T466X5D Adverse effect of antihyperlipidemic and antiarteriosclerotic drugs, subsequent encounter: Secondary | ICD-10-CM

## 2024-04-10 DIAGNOSIS — R9439 Abnormal result of other cardiovascular function study: Secondary | ICD-10-CM

## 2024-04-10 DIAGNOSIS — E785 Hyperlipidemia, unspecified: Secondary | ICD-10-CM

## 2024-04-10 DIAGNOSIS — E1165 Type 2 diabetes mellitus with hyperglycemia: Secondary | ICD-10-CM

## 2024-04-10 DIAGNOSIS — I2510 Atherosclerotic heart disease of native coronary artery without angina pectoris: Secondary | ICD-10-CM | POA: Diagnosis not present

## 2024-04-10 DIAGNOSIS — R079 Chest pain, unspecified: Secondary | ICD-10-CM | POA: Diagnosis not present

## 2024-04-10 DIAGNOSIS — E1169 Type 2 diabetes mellitus with other specified complication: Secondary | ICD-10-CM

## 2024-04-10 MED ORDER — METOPROLOL SUCCINATE ER 25 MG PO TB24: 25.0000 mg | ORAL_TABLET | Freq: Every day | ORAL | 3 refills | Status: DC

## 2024-04-10 MED ORDER — NITROGLYCERIN 0.4 MG SL SUBL
0.4000 mg | SUBLINGUAL_TABLET | SUBLINGUAL | 0 refills | Status: AC | PRN
Start: 2024-04-10 — End: ?

## 2024-04-10 NOTE — Progress Notes (Signed)
 Cardiology Office Note:  .   Date:  04/10/2024  ID:  Lamar Robson, DOB Jul 21, 1956, MRN [id number] PCP:  Shona Norleen PEDLAR, MD  Former Cardiology Providers: Dr. J[facility] Providers Cardiologist:  Madonna Large, DO , [facility] (established care 04/10/24) Electrophysiologist:  None  Click to update primary MD,subspecialty MD or APP then REFRESH:1}    Chief Complaint  Patient presents with   Coronary artery calcification seen on CT scan   Follow-up    History of Present Illness: .   Lendell Gallick is a 68 y.o. Caucasian male whose past medical history and cardiovascular risk factors includes: Dyslipidemia, statin intolerance, coronary calcification, insulin  dependent diabetes mellitus type 2, OSA, hypopituitarism, history of DVT, strong family history of premature coronary artery disease.  Patient is referred to practice for coronary artery calcification management.  He has prolonged history of dyslipidemia likely due to metabolic and hypopituitary pathophysiology.  Has been intolerant to statin therapy (specifically rosuvastatin  pravastatin).  He was then transitioned to Zetia  plus Praluent  and due to insurance coverage changes Praluent  was transitioned to Repatha .  Reviewing the last progress note from Rosaline Jetty from August 31, 2023 coronary calcium  score was not recommended to further risk stratify the patient given his history of dyslipidemia, diabetes, hypertension, and premature coronary disease in the family.  Patient presents to the office to establish care after his recent cardiac PET/CT.  Patient states that the cardiac PET/CT was performed as part of workup for fatigue and precordial pain.  Precordial pain: Ongoing for at least 1 year. Left-sided. Intermittent. Duration: 20-60 minutes Not always with effort related activities. Can occur at rest. Usually self-limited or resolves after taking aspirin  Currently ambulates about 0.5 miles per day, has  not tried overexerting himself as his physical activity is limited due to right knee pain  Strong family history of coronary artery disease: Father had a quintuple bypass surgery at the age of 40 Patient states that all of his father's brothers and sisters have coronary artery disease to a certain extent. Patient's younger brother had a myocardial infarction at the age of 66.  Patient has been a diabetic since 2007.  No active chest pain.  Review of Systems: .   Review of Systems  Constitutional: Positive for malaise/fatigue.  Cardiovascular:  Positive for chest pain. Negative for claudication, irregular heartbeat, leg swelling, near-syncope, orthopnea, palpitations, paroxysmal nocturnal dyspnea and syncope.  Respiratory:  Negative for shortness of breath.   Hematologic/Lymphatic: Negative for bleeding problem.    Studies Reviewed:   EKG: EKG Interpretation Date/Time:  Tuesday April 10 2024 08:46:59 EDT Ventricular Rate:  87 PR Interval:  204 QRS Duration:  88 QT Interval:  348 QTC Calculation: 418 R Axis:   -20  Text Interpretation: Normal sinus rhythm Inferior infarct (cited on or before 22-Aug-2017) Cannot rule out Anterior infarct , age undetermined When compared with ECG of 31-Aug-2023 10:38, No significant change was found Confirmed by Large Madonna [phone]) on 04/10/2024 9:03:55 AM  Echocardiogram: None   Coronary calcium  score: 11/10/2023 Coronary calcium  score of 503. This was 40 th percentile for age and sex matched control.  Cardiac PET/CT 03/28/2024   The study shows no epicardial disease.  Presence of decreased flow reserve can be seen in microvascular disease, though motion artifact noted on flow reserved evaluation. The study is intermediate risk due to flow reserve.   LV perfusion is normal. There is no evidence of epicardial ischemia. There is no evidence of infarction.   Rest  left ventricular function is normal. Stress left ventricular function is normal.  Stress EF: 55%. End diastolic cavity size is normal. End systolic cavity size is normal.   Myocardial blood flow was computed to be 0.33ml/g/min at rest and 1.27ml/g/min at stress. Global myocardial blood flow reserve was 1.79 and was mildly abnormal.   Coronary calcium  was present on the attenuation correction CT images. Severe coronary calcifications were present. Coronary calcifications were present in the left anterior descending artery and left circumflex artery distribution(s). Aortic atherosclerosis.   Electronically Signed  By: Stanly Leavens M.D. Non-cardiac findings:  1. No significant non-cardiac findings. 2. Stable 3 mm subpleural right middle lobe nodule and 5 mm right minor fissural nodule have previous demonstrated long-term stability and require no further follow-up. 3. Hepatic steatosis.  RADIOLOGY: NA  Risk Assessment/Calculations:   NA   Labs:       Latest Ref Rng & Units 03/04/2023    4:01 AM 03/03/2023    3:45 AM 02/23/2023   10:51 AM  CBC  WBC 4.0 - 10.5 K/uL 17.2  16.3  8.4   Hemoglobin 13.0 - 17.0 g/dL 86.0  85.2  82.9   Hematocrit 39.0 - 52.0 % 40.3  42.8  51.1   Platelets 150 - 400 K/uL 193  205  272        Latest Ref Rng & Units 12/08/2023   11:23 AM 03/05/2023    4:00 AM 03/03/2023    3:45 AM  BMP  Glucose 70 - 99 mg/dL 848  818  813   BUN 8 - 23 mg/dL 9  10  9    Creatinine 0.61 - 1.24 mg/dL 9.19  9.07  9.20   Sodium 135 - 145 mmol/L 136  132  130   Potassium 3.5 - 5.1 mmol/L 3.9  3.2  3.4   Chloride 98 - 111 mmol/L 104  96  100   CO2 22 - 32 mmol/L 22  25  21    Calcium  8.9 - 10.3 mg/dL 9.0  8.7  8.3       Latest Ref Rng & Units 12/08/2023   11:23 AM 03/05/2023    4:00 AM 03/03/2023    3:45 AM  CMP  Glucose 70 - 99 mg/dL 848  818  813   BUN 8 - 23 mg/dL 9  10  9    Creatinine 0.61 - 1.24 mg/dL 9.19  9.07  9.20   Sodium 135 - 145 mmol/L 136  132  130   Potassium 3.5 - 5.1 mmol/L 3.9  3.2  3.4   Chloride 98 - 111 mmol/L 104  96  100   CO2 22 -  32 mmol/L 22  25  21    Calcium  8.9 - 10.3 mg/dL 9.0  8.7  8.3     Lab Results  Component Value Date   CHOL 87 (L) 11/26/2022   HDL 33 (L) 11/26/2022   LDLCALC 24 11/26/2022   TRIG 183 (H) 11/26/2022   CHOLHDL 2.6 11/26/2022   No results for input(s): LIPOA in the last 8760 hours. No components found for: NTPROBNP No results for input(s): PROBNP in the last 8760 hours. No results for input(s): TSH in the last 8760 hours.  Physical Exam:    Today's Vitals   04/10/24 0840  BP: 134/88  Pulse: 87  Resp: 16  SpO2: 94%  Weight: 277 lb 6.4 oz (125.8 kg)  Height: 5' 11 (1.803 m)   Body mass index is 38.69 kg/m. Wt Readings from Last  3 Encounters:  04/10/24 277 lb 6.4 oz (125.8 kg)  01/02/24 274 lb (124.3 kg)  12/29/23 270 lb (122.5 kg)    Physical Exam  Constitutional: No distress.  hemodynamically stable  Neck: No JVD present.  Cardiovascular: Normal rate, regular rhythm, S1 normal and S2 normal. Exam reveals no gallop, no S3 and no S4.  No murmur heard. Pulmonary/Chest: Effort normal and breath sounds normal. No stridor. He has no wheezes. He has no rales.  Musculoskeletal:        General: No edema.     Cervical back: Neck supple.  Skin: Skin is warm.     Impression & Recommendation(s):  Impression:   ICD-10-CM   1. Precordial pain  R07.2 CBC    Basic metabolic panel with GFR    Basic metabolic panel with GFR    CBC    ECHOCARDIOGRAM COMPLETE    2. Abnormal cardiovascular stress test  R94.39 CBC    Basic metabolic panel with GFR    Basic metabolic panel with GFR    CBC    ECHOCARDIOGRAM COMPLETE    3. Coronary atherosclerosis due to calcified coronary lesion  I25.10 EKG 12-Lead   I25.84     4. Type 2 diabetes mellitus with hyperglycemia, with long-term current use of insulin  (HCC)  E11.65    Z79.4     5. Hyperlipidemia associated with type 2 diabetes mellitus (HCC)  E11.69    E78.5     6. Myalgia due to statin  M79.10    T46.6X5A     7.  Benign hypertension  I10 ECHOCARDIOGRAM COMPLETE    8. Family history of premature CAD  Z82.49        Recommendation(s):  Precordial pain Abnormal cardiovascular stress test Coronary atherosclerosis due to calcified coronary lesion Continues to have precordial pain with mixed features. Multiple cardiovascular risk factors which include but not limited to long history of insulin -dependent diabetes, severe coronary artery calcification, strong family history of premature coronary artery disease. Cardiac PET/CT considered to be intermediate risk study. EKG shows a sinus rhythm without underlying injury pattern Patient's pretest probability for having obstructive disease is at least intermediate to high. 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  After discussing the risks, benefits, and alternatives patient would like to proceed with both, which is reasonable for reasons mentioned above. Antianginal therapy: Start Toprol -XL 25 mg p.o. daily, start nitroglycerin  tablets on as needed basis Drug profile discussed. Echo will be ordered to evaluate for structural heart disease and left ventricular systolic function. Educated him on seeking medical attention sooner by going to the closest ER via EMS if the symptoms increase in intensity, frequency, duration, or has typical chest pain as discussed in the office.  Patient verbalized understanding.  Type 2 diabetes mellitus with hyperglycemia, with long-term current use of insulin  (HCC) Diagnosed with diabetes back in 2007. Currently on losartan, Repatha  Reemphasized importance of glycemic control.  Most recent hemoglobin A1c 8.9 as of March 07, 2024 from [facility] database  Hyperlipidemia associated with type 2 diabetes mellitus (HCC) Myalgia due to statin Currently on  Repatha . Intolerant to statin therapy-rosuvastatin /pravastatin Most recent LDL 34 mg/dL as of June 2025  Benign hypertension Office blood pressures are well-controlled. Medications as discussed above Reemphasized importance of low-salt diet.  Family history of premature CAD Strong family history of premature coronary artery disease  as noted above. Reemphasize importance of improving his modifiable cardiovascular risk factors.   Orders Placed:  Orders Placed This Encounter  Procedures   CBC    Standing Status:   Future    Number of Occurrences:   1    Expected Date:   04/10/2024    Expiration Date:   04/10/2025   Basic metabolic panel with GFR    Standing Status:   Future    Number of Occurrences:   1    Expected Date:   04/10/2024    Expiration Date:   04/10/2025   EKG 12-Lead   ECHOCARDIOGRAM COMPLETE    Standing Status:   Future    Expected Date:   04/17/2024    Expiration Date:   04/10/2025    Where should this test be performed:   [facility]    Perflutren DEFINITY (image enhancing agent) should be administered unless hypersensitivity or allergy exist:   Administer Perflutren    Reason for exam-Echo:   Other-Full Diagnosis List    Full ICD-10/Reason for Exam:   Abnormal cardiovascular stress test [688711]    Full ICD-10/Reason for Exam:   Chest pain [255200]     Final Medication List:    Meds ordered this encounter  Medications   metoprolol  succinate (TOPROL  XL) 25 MG 24 hr tablet    Sig: Take 1 tablet (25 mg total) by mouth daily.    Dispense:  30 tablet    Refill:  3   nitroGLYCERIN  (NITROSTAT ) 0.4 MG SL tablet    Sig: Place 1 tablet (0.4 mg total) under the tongue every 5 (five) minutes as needed for chest pain.    Dispense:  30 tablet    Refill:  0    Medications Discontinued During This Encounter  Medication Reason   traMADol  (ULTRAM ) 50 MG tablet Patient Preference     Current Outpatient Medications:    5-Hydroxytryptophan (5-HTP PO), Take 100 mg by mouth  at bedtime., Disp: , Rfl:    AMBULATORY NON FORMULARY MEDICATION, Medication Name: Trimix  PGE 30 mcg Pap 30 mg Phent 1 mg (Patient taking differently: daily as needed (ED). Medication Name: Trimix  PGE 30 mcg Pap 30 mg Phent 1 mg), Disp: 5 mL, Rfl: 5   Ascorbic Acid (VITAMIN C) 1000 MG tablet, Take 1,000 mg by mouth in the morning., Disp: , Rfl:    aspirin EC 81 MG tablet, Take 81 mg by mouth in the morning. Swallow whole., Disp: , Rfl:    cetirizine (ZYRTEC) 10 MG tablet, Take 10 mg by mouth every evening. , Disp: , Rfl:    Cholecalciferol  (VITAMIN D3) 125 MCG (5000 UT) CAPS, Take 5,000 Units by mouth in the morning., Disp: , Rfl:    COENZYME Q10 PO, Take 200 mg by mouth in the morning., Disp: , Rfl:    Dulaglutide  (TRULICITY ) 3 MG/0.5ML SOPN, Inject 3 mg as directed once a week. **NEEDS TO BE SEEN BEFORE NEXT REFILL** (Patient taking differently: Inject 3 mg as directed every Sunday. **NEEDS TO BE SEEN BEFORE NEXT REFILL**), Disp: 2 mL, Rfl: 0   Evolocumab  (REPATHA  SURECLICK) 140 MG/ML SOAJ, Inject 140 mg into the skin every 14 (fourteen) days. (Patient taking differently: Inject 140 mg into the skin every 14 (fourteen) days. Sundays), Disp: 6 mL, Rfl: 3   fexofenadine (ALLEGRA) 180 MG tablet, Take 180 mg by mouth every morning. , Disp: , Rfl:    glipiZIDE  (GLUCOTROL ) 10 MG tablet, Take 10 mg by mouth daily before  breakfast., Disp: , Rfl:    glucose blood (ONETOUCH ULTRA) test strip, check blood sugar once daily. DX:E11.69, Disp: 100 each, Rfl: 3   ibuprofen  (ADVIL ) 800 MG tablet, Take 800 mg by mouth every 8 (eight) hours as needed (pain.)., Disp: , Rfl:    Insulin  Pen Needle (PEN NEEDLES) 31G X 6 MM MISC, 1 each by Does not apply route daily., Disp: 100 each, Rfl: 1   LANTUS  SOLOSTAR 100 UNIT/ML Solostar Pen, Inject 50 Units into the skin at bedtime., Disp: , Rfl:    losartan (COZAAR) 50 MG tablet, Take 50 mg by mouth daily., Disp: , Rfl:    MAGNESIUM  BISGLYCINATE PO, Take 1 capsule by mouth in  the morning., Disp: , Rfl:    methocarbamol  (ROBAXIN ) 500 MG tablet, Take 1 tablet (500 mg total) by mouth every 8 (eight) hours as needed for muscle spasms., Disp: 30 tablet, Rfl: 1   metoprolol  succinate (TOPROL  XL) 25 MG 24 hr tablet, Take 1 tablet (25 mg total) by mouth daily., Disp: 30 tablet, Rfl: 3   Multiple Vitamin (MULTIVITAMIN WITH MINERALS) TABS tablet, Take 1 tablet by mouth in the morning. Centrum, Disp: , Rfl:    nitroGLYCERIN  (NITROSTAT ) 0.4 MG SL tablet, Place 1 tablet (0.4 mg total) under the tongue every 5 (five) minutes as needed for chest pain., Disp: 30 tablet, Rfl: 0   Omega-3 Fatty Acids (FISH OIL) 1000 MG CAPS, Take 1,000 mg by mouth in the morning., Disp: , Rfl:    pantoprazole  (PROTONIX ) 40 MG tablet, Take 1 tablet (40 mg total) by mouth 2 (two) times daily., Disp: 60 tablet, Rfl: 11   Probiotic Product (PROBIOTIC PO), Take 1 capsule by mouth in the morning.  Probiotic-10 25 Billion with 10 Probiotic Strains Veggie Capsules, Disp: , Rfl:    silodosin  (RAPAFLO ) 8 MG CAPS capsule, Take 1 capsule (8 mg total) by mouth daily with breakfast., Disp: 30 capsule, Rfl: 3   sodium chloride  (OCEAN) 0.65 % SOLN nasal spray, Place 1 spray into both nostrils 2 (two) times daily., Disp: , Rfl:    SYRINGE-NEEDLE, DISP, 3 ML (LUER LOCK SAFETY SYRINGES) 22G X 1 3 ML MISC, For testosterone   injections, Disp: 30 each, Rfl: 1   testosterone  cypionate (DEPOTESTOSTERONE CYPIONATE) 200 MG/ML injection, Inject 100 mg into the muscle every 14 (fourteen) days. For IM use only, Disp: , Rfl:    vitamin E 180 MG (400 UNITS) capsule, Take 400 Units by mouth in the morning., Disp: , Rfl:    ZINC  PICOLINATE PO, Take 50 mg by mouth in the morning., Disp: , Rfl:    Specialty Vitamins Products (ECHINACEA C COMPLETE PO), Take 1 capsule by mouth daily as needed (immune health)., Disp: , Rfl:   Consent:   Informed Consent   Shared Decision Making/Informed Consent The risks [stroke (1 in 1000), death (1 in  1000), kidney failure [usually temporary] (1 in 500), bleeding (1 in 200), allergic reaction [possibly serious] (1 in 200)], benefits (diagnostic support and management of coronary artery disease) and alternatives of a cardiac catheterization were discussed in detail with Mr. Bidinger and he is willing to proceed.     Disposition:   3 months or sooner if needed  His questions and concerns were addressed to his satisfaction. He voices understanding of the recommendations provided during this encounter.    Signed, Madonna Large, DO, [facility]  A Division of [facility] [address], [place], [place] [address]  [place], [place]  [id number] 04/10/2024 11:51 AM

## 2024-04-10 NOTE — Patient Instructions (Addendum)
 Medication Instructions:  START Metoprolol  Succinate (Toprol -XL) 25 mg once daily in the morning  AS NEEDED Nitroglycerin  0.4 mg SL The proper use and anticipated side effects of nitroglycerine has been carefully explained.  If a single episode of chest pain is not relieved by one tablet, the patient will try another within 5 minutes; and if this doesn't relieve the pain, the patient is instructed to call 911 for transportation to an emergency department.  *If you need a refill on your cardiac medications before your next appointment, please call your pharmacy*  Lab Work: To be completed today: BMP and CBC  If you have labs (blood work) drawn today and your tests are completely normal, you will receive your results only by: MyChart Message (if you have MyChart) OR A paper copy in the mail If you have any lab test that is abnormal or we need to change your treatment, we will call you to review the results.  Testing/Procedures: Your provider has requested that you have a left heart catherization on Monday 04/16/24 with Dr. Elmira.  Your physician has requested that you have an echocardiogram. Echocardiography is a painless test that uses sound waves to create images of your heart. It provides your doctor with information about the size and shape of your heart and how well your heart's chambers and valves are working. This procedure takes approximately one hour. There are no restrictions for this procedure. Please do NOT wear cologne, perfume, aftershave, or lotions (deodorant is allowed). Please arrive 15 minutes prior to your appointment time.  Please note: We ask at that you not bring children with you during ultrasound (echo/ vascular) testing. Due to room size and safety concerns, children are not allowed in the ultrasound rooms during exams. Our front office staff cannot provide observation of children in our lobby area while testing is being conducted. An adult accompanying a patient to  their appointment will only be allowed in the ultrasound room at the discretion of the ultrasound technician under special circumstances. We apologize for any inconvenience.   Follow-Up: At [facility], you and your health needs are our priority.  As part of our continuing mission to provide you with exceptional heart care, our providers are all part of one team.  This team includes your primary Cardiologist (physician) and Advanced Practice Providers or APPs (Physician Assistants and Nurse Practitioners) who all work together to provide you with the care you need, when you need it.  Your next appointment:   3 month(s)  Provider:   Madonna Large, DO     Other Instructions       Cardiac/Peripheral Catheterization   You are scheduled for a Cardiac Catheterization on Monday, July 21 with Dr. Newman Elmira.  1. Please arrive at the [facility] (Main Entrance A) at [facility]: [address] [place], [place] [address] at 5:30 AM (This time is TWO hour(s) before your procedure to ensure your preparation).   Free valet parking service is available. You will check in at ADMITTING. The support person will be asked to wait in the waiting room.  It is OK to have someone drop you off and come back when you are ready to be discharged.        Special note: Every effort is made to have your procedure done on time. Please understand that emergencies sometimes delay scheduled procedures.  2. Diet: Do not eat solid foods after midnight.  You may have clear liquids until 5 AM the day  of the procedure.  3. Labs: You will need to have blood drawn today.  You do not need to be fasting.  4. Medication instructions in preparation for your procedure:   Contrast Allergy: No   Take only 25 units of insulin  the night before your procedure. Do not take any insulin  on the day of the procedure.  No glipizide  and no insulin  on morning of procedure.  On the morning of your procedure,  take Aspirin 81 mg and any morning medicines NOT listed above.  You may use sips of water .  5. Plan to go home the same day, you will only stay overnight if medically necessary. 6. You MUST have a responsible adult to drive you home. 7. An adult MUST be with you the first 24 hours after you arrive home. 8. Bring a current list of your medications, and the last time and date medication taken. 9. Bring ID and current insurance cards. 10.Please wear clothes that are easy to get on and off and wear slip-on shoes.  Thank you for allowing us  to care for you!   -- [place] [facility]

## 2024-04-11 ENCOUNTER — Ambulatory Visit: Payer: Self-pay

## 2024-04-11 LAB — BASIC METABOLIC PANEL WITH GFR
BUN/Creatinine Ratio: 12 (ref 10–24)
BUN: 10 mg/dL (ref 8–27)
CO2: 19 mmol/L — ABNORMAL LOW (ref 20–29)
Calcium: 9.7 mg/dL (ref 8.6–10.2)
Chloride: 103 mmol/L (ref 96–106)
Creatinine, Ser: 0.86 mg/dL (ref 0.76–1.27)
Glucose: 92 mg/dL (ref 70–99)
Potassium: 4.8 mmol/L (ref 3.5–5.2)
Sodium: 141 mmol/L (ref 134–144)
eGFR: 95 mL/min/1.73 (ref >59)

## 2024-04-11 LAB — CBC
Hematocrit: 52.3 % — ABNORMAL HIGH (ref 37.5–51.0)
Hemoglobin: 17.1 g/dL (ref 13.0–17.7)
MCH: 30.2 pg (ref 26.6–33.0)
MCHC: 32.7 g/dL (ref 31.5–35.7)
MCV: 92 fL (ref 79–97)
Platelets: 282 x10E3/uL (ref 150–450)
RBC: 5.66 x10E6/uL (ref 4.14–5.80)
RDW: 14.4 % (ref 11.6–15.4)
WBC: 8.3 x10E3/uL (ref 3.4–10.8)

## 2024-04-12 ENCOUNTER — Telehealth: Payer: Self-pay | Admitting: *Deleted

## 2024-04-12 NOTE — Telephone Encounter (Addendum)
 Cardiac Catheterization scheduled at [facility] for: Monday April 16, 2024 7:30 AM Arrival time [facility] Main Entrance A at: 5:30 AM  Nothing to eat after midnight prior to procedure, clear liquids until 5 AM day of procedure.  Medication instructions: -Hold:  Insulin -AM of procedure-1/2 PM prior if takes HS  Glipizide -AM of procedure  Trulicity -weekly on Sundays (will hold 7/20)   -Other usual morning medications can be taken with sips of water  including aspirin 81 mg.  Plan to go home the same day, you will only stay overnight if medically necessary.  You must have responsible adult to drive you home.  Someone must be with you the first 24 hours after you arrive home.  Reviewed procedure instructions with patient.

## 2024-04-16 ENCOUNTER — Encounter (HOSPITAL_COMMUNITY): Payer: Self-pay | Admitting: Cardiology

## 2024-04-16 ENCOUNTER — Ambulatory Visit (HOSPITAL_COMMUNITY)
Admission: RE | Admit: 2024-04-16 | Discharge: 2024-04-16 | Disposition: A | Discharge status: Home / Self Care | Attending: Cardiology | Admitting: Cardiology

## 2024-04-16 ENCOUNTER — Encounter (HOSPITAL_COMMUNITY)
Admission: RE | Disposition: A | Discharge status: Home / Self Care | Payer: Self-pay | Source: Home / Self Care | Attending: Cardiology

## 2024-04-16 ENCOUNTER — Other Ambulatory Visit: Payer: Self-pay

## 2024-04-16 DIAGNOSIS — I2585 Chronic coronary microvascular dysfunction: Secondary | ICD-10-CM | POA: Insufficient documentation

## 2024-04-16 DIAGNOSIS — Z794 Long term (current) use of insulin: Secondary | ICD-10-CM | POA: Insufficient documentation

## 2024-04-16 DIAGNOSIS — R9439 Abnormal result of other cardiovascular function study: Secondary | ICD-10-CM | POA: Diagnosis not present

## 2024-04-16 DIAGNOSIS — E1165 Type 2 diabetes mellitus with hyperglycemia: Secondary | ICD-10-CM | POA: Diagnosis not present

## 2024-04-16 DIAGNOSIS — G4733 Obstructive sleep apnea (adult) (pediatric): Secondary | ICD-10-CM | POA: Insufficient documentation

## 2024-04-16 DIAGNOSIS — Z8249 Family history of ischemic heart disease and other diseases of the circulatory system: Secondary | ICD-10-CM | POA: Diagnosis not present

## 2024-04-16 DIAGNOSIS — Z86718 Personal history of other venous thrombosis and embolism: Secondary | ICD-10-CM | POA: Diagnosis not present

## 2024-04-16 DIAGNOSIS — E785 Hyperlipidemia, unspecified: Secondary | ICD-10-CM | POA: Insufficient documentation

## 2024-04-16 DIAGNOSIS — I10 Essential (primary) hypertension: Secondary | ICD-10-CM | POA: Insufficient documentation

## 2024-04-16 DIAGNOSIS — R072 Precordial pain: Secondary | ICD-10-CM | POA: Diagnosis not present

## 2024-04-16 DIAGNOSIS — Z79899 Other long term (current) drug therapy: Secondary | ICD-10-CM | POA: Diagnosis not present

## 2024-04-16 HISTORY — PX: LEFT HEART CATH AND CORONARY ANGIOGRAPHY: CATH118249

## 2024-04-16 HISTORY — PX: CORONARY PRESSURE/FFR STUDY: CATH118243

## 2024-04-16 LAB — GLUCOSE, CAPILLARY
Glucose-Capillary: 171 mg/dL — ABNORMAL HIGH (ref 70–99)
Glucose-Capillary: 155 mg/dL — ABNORMAL HIGH (ref 70–99)

## 2024-04-16 LAB — POCT ACTIVATED CLOTTING TIME: Activated Clotting Time: 400 s

## 2024-04-16 SURGERY — LEFT HEART CATH AND CORONARY ANGIOGRAPHY
Anesthesia: LOCAL

## 2024-04-16 MED ORDER — MIDAZOLAM HCL 2 MG/2ML IJ SOLN
INTRAMUSCULAR | Status: DC | PRN
  Administered 2024-04-16: 1 mg via INTRAVENOUS

## 2024-04-16 MED ORDER — VERAPAMIL HCL 2.5 MG/ML IV SOLN
INTRAVENOUS | Status: DC | PRN
  Administered 2024-04-16: 10 mL via INTRA_ARTERIAL

## 2024-04-16 MED ORDER — HEPARIN (PORCINE) IN NACL 1000-0.9 UT/500ML-% IV SOLN
INTRAVENOUS | Status: DC | PRN
  Administered 2024-04-16 (×3): 500 mL via INTRA_ARTERIAL

## 2024-04-16 MED ORDER — SODIUM CHLORIDE 0.9 % WEIGHT BASED INFUSION: 3.0000 mL/kg/h | INTRAVENOUS | Status: AC

## 2024-04-16 MED ORDER — NITROGLYCERIN 1 MG/10 ML FOR IR/CATH LAB
INTRA_ARTERIAL | Status: DC | PRN
  Administered 2024-04-16: 200 ug via INTRACORONARY

## 2024-04-16 MED ORDER — SODIUM CHLORIDE 0.9 % IV SOLN
INTRAVENOUS | Status: DC | PRN
  Administered 2024-04-16: 10 mL/h via INTRAVENOUS

## 2024-04-16 MED ORDER — FENTANYL CITRATE (PF) 100 MCG/2ML IJ SOLN
INTRAMUSCULAR | Status: DC | PRN
  Administered 2024-04-16: 25 ug via INTRAVENOUS

## 2024-04-16 MED ORDER — VERAPAMIL HCL 2.5 MG/ML IV SOLN
INTRAVENOUS | Status: AC
  Filled 2024-04-16: qty 2

## 2024-04-16 MED ORDER — LIDOCAINE HCL (PF) 1 % IJ SOLN
INTRAMUSCULAR | Status: AC
  Filled 2024-04-16: qty 30

## 2024-04-16 MED ORDER — SODIUM CHLORIDE 0.9 % WEIGHT BASED INFUSION: 1.0000 mL/kg/h | INTRAVENOUS | Status: DC

## 2024-04-16 MED ORDER — HEPARIN SODIUM (PORCINE) 1000 UNIT/ML IJ SOLN
INTRAMUSCULAR | Status: DC | PRN
  Administered 2024-04-16: 7000 [IU] via INTRAVENOUS
  Administered 2024-04-16: 6000 [IU] via INTRAVENOUS

## 2024-04-16 MED ORDER — NITROGLYCERIN 1 MG/10 ML FOR IR/CATH LAB
INTRA_ARTERIAL | Status: AC
  Filled 2024-04-16: qty 10

## 2024-04-16 MED ORDER — ASPIRIN 81 MG PO CHEW: 81.0000 mg | CHEWABLE_TABLET | ORAL | Status: AC

## 2024-04-16 MED ORDER — HEPARIN SODIUM (PORCINE) 1000 UNIT/ML IJ SOLN
INTRAMUSCULAR | Status: AC
  Filled 2024-04-16: qty 10

## 2024-04-16 MED ORDER — ADENOSINE 12 MG/4ML IV SOLN
INTRAVENOUS | Status: AC
  Filled 2024-04-16: qty 16

## 2024-04-16 MED ORDER — LIDOCAINE HCL (PF) 1 % IJ SOLN
INTRAMUSCULAR | Status: DC | PRN
  Administered 2024-04-16: 2 mL

## 2024-04-16 MED ORDER — FENTANYL CITRATE (PF) 100 MCG/2ML IJ SOLN
INTRAMUSCULAR | Status: AC
  Filled 2024-04-16: qty 2

## 2024-04-16 MED ORDER — MIDAZOLAM HCL 2 MG/2ML IJ SOLN
INTRAMUSCULAR | Status: AC
  Filled 2024-04-16: qty 2

## 2024-04-16 MED ORDER — IOHEXOL 350 MG/ML SOLN
INTRAVENOUS | Status: DC | PRN
  Administered 2024-04-16: 70 mL via INTRA_ARTERIAL

## 2024-04-16 MED ORDER — ADENOSINE (DIAGNOSTIC) 140MCG/KG/MIN
INTRAVENOUS | Status: DC | PRN
  Administered 2024-04-16: 140 ug/kg/min via INTRAVENOUS

## 2024-04-16 SURGICAL SUPPLY — 12 items
CATH INFINITI 5 FR 3DRC (CATHETERS) IMPLANT
CATH INFINITI AMBI 5FR TG (CATHETERS) IMPLANT
CATH INFINITI JR4 5F (CATHETERS) IMPLANT
CATH VISTA GUIDE 6FR XBLD 3.5 (CATHETERS) IMPLANT
DEVICE RAD COMP TR BAND LRG (VASCULAR PRODUCTS) IMPLANT
GLIDESHEATH SLEND A-KIT 6F 22G (SHEATH) IMPLANT
GUIDEWIRE INQWIRE 1.5J.035X260 (WIRE) IMPLANT
GUIDEWIRE PRESSURE X 175 (WIRE) IMPLANT
KIT HEMO VALVE WATCHDOG (MISCELLANEOUS) IMPLANT
KIT SYRINGE INJ CVI SPIKEX1 (MISCELLANEOUS) IMPLANT
PACK CARDIAC CATHETERIZATION (CUSTOM PROCEDURE TRAY) ×1 IMPLANT
SET ATX-X65L (MISCELLANEOUS) IMPLANT

## 2024-04-16 NOTE — Interval H&P Note (Signed)
 History and Physical Interval Note:  04/16/2024 7:39 AM  Roger Guerra  has presented today for surgery, with the diagnosis of chest pain - abnormal stress test.  The various methods of treatment have been discussed with the patient and family. After consideration of risks, benefits and other options for treatment, the patient has consented to  Procedure(s): LEFT HEART CATH AND CORONARY ANGIOGRAPHY (N/A) as a surgical intervention.  The patient's history has been reviewed, patient examined, no change in status, stable for surgery.  I have reviewed the patient's chart and labs.  Questions were answered to the patient's satisfaction.     [name] J Inette Doubrava

## 2024-04-16 NOTE — Discharge Instructions (Signed)
 Radial Site Care The following information offers guidance on how to care for yourself after your procedure. Your health care provider may also give you more specific instructions. If you have problems or questions, contact your health care provider. What can I expect after the procedure? After the procedure, it is common to have bruising and tenderness in the incision area. Follow these instructions at home: Incision site care  Follow instructions from your health care provider about how to take care of your incision site. Make sure you: Wash your hands with soap and water for at least 20 seconds before and after you change your bandage (dressing). If soap and water are not available, use hand sanitizer. Remove your dressing in 24 hours. Leave stitches (sutures), skin glue, or adhesive strips in place. These skin closures may need to stay in place for 2 weeks or longer. If adhesive strip edges start to loosen and curl up, you may trim the loose edges. Do not remove adhesive strips completely unless your health care provider tells you to do that. Do not take baths, swim, or use a hot tub for at least 1 week. You may shower 24 hours after the procedure or as told by your health care provider. Remove the dressing and gently wash the incision area with plain soap and water. Pat the area dry with a clean towel. Do not rub the site. That could cause bleeding. Do not apply powder or lotion to the site. Check your incision site every day for signs of infection. Check for: Redness, swelling, or pain. Fluid or blood. Warmth. Pus or a bad smell. Activity For 24 hours after the procedure, or as directed by your health care provider: Do not flex or bend the affected arm. Do not push or pull heavy objects with the affected arm. Do not operate machinery or power tools. Do not drive. You should not drive yourself home from the hospital or clinic if you go home during that time period. You may drive 24  hours after the procedure unless your health care provider tells you not to. Do not lift anything that is heavier than 10 lb (4.5 kg), or the limit that you are told, until your health care provider says that it is safe. Return to your normal activities as told by your health care provider. Ask your health care provider what activities are safe for you and when you can return to work. If you were given a sedative during the procedure, it can affect you for several hours. Do not drive or operate machinery until your health care provider says that it is safe. General instructions Take over-the-counter and prescription medicines only as told by your health care provider. If you will be going home right after the procedure, plan to have a responsible adult care for you for the time you are told. This is important. Keep all follow-up visits. This is important. Contact a health care provider if: You have a fever or chills. You have any of these signs of infection at your incision site: Redness, swelling, or pain. Fluid or blood. Warmth. Pus or a bad smell. Get help right away if: The incision area swells very fast. The incision area is bleeding, and the bleeding does not stop when you hold steady pressure on the area. Your arm or hand becomes pale, cool, tingly, or numb. These symptoms may represent a serious problem that is an emergency. Do not wait to see if the symptoms will go away. Get medical  help right away. Call your local emergency services (911 in the [place] Do not drive yourself to the hospital. Summary After the procedure, it is common to have bruising and tenderness at the incision site. Follow instructions from your health care provider about how to take care of your radial site incision. Check the incision every day for signs of infection. Do not lift anything that is heavier than 10 lb (4.5 kg), or the limit that you are told, until your health care provider says that it is  safe. Get help right away if the incision area swells very fast, you have bleeding at the incision site that will not stop, or your arm or hand becomes pale, cool, or numb. This information is not intended to replace advice given to you by your health care provider. Make sure you discuss any questions you have with your health care provider. Document Revised: 11/02/2020 Document Reviewed: 11/02/2020 Elsevier Patient Education  2024 [employer].

## 2024-04-16 NOTE — H&P (Signed)
 OV 04/10/2024 copied for documentation   Cardiology Office Note:  .   Date:  04/16/2024  ID:  Lamar Robson, DOB 1956/01/04, MRN [id number] PCP:  Shona Norleen PEDLAR, MD  Former Cardiology Providers: Dr. Jayson Sierras [facility] Providers Cardiologist:  Madonna Large, DO , [facility] (established care 04/10/24) Electrophysiologist:  None  Click to update primary MD,subspecialty MD or APP then REFRESH:1}    No chief complaint on file.   History of Present Illness: .   Nethaniel Mattie is a 68 y.o. Caucasian male whose past medical history and cardiovascular risk factors includes: Dyslipidemia, statin intolerance, coronary calcification, insulin  dependent diabetes mellitus type 2, OSA, hypopituitarism, history of DVT, strong family history of premature coronary artery disease.  Patient is referred to practice for coronary artery calcification management.  He has prolonged history of dyslipidemia likely due to metabolic and hypopituitary pathophysiology.  Has been intolerant to statin therapy (specifically rosuvastatin  pravastatin).  He was then transitioned to Zetia  plus Praluent  and due to insurance coverage changes Praluent  was transitioned to Repatha .  Reviewing the last progress note from Rosaline Jetty from August 31, 2023 coronary calcium  score was not recommended to further risk stratify the patient given his history of dyslipidemia, diabetes, hypertension, and premature coronary disease in the family.  Patient presents to the office to establish care after his recent cardiac PET/CT.  Patient states that the cardiac PET/CT was performed as part of workup for fatigue and precordial pain.  Precordial pain: Ongoing for at least 1 year. Left-sided. Intermittent. Duration: 20-60 minutes Not always with effort related activities. Can occur at rest. Usually self-limited or resolves after taking aspirin   Currently ambulates about 0.5 miles per day, has not tried overexerting himself as  his physical activity is limited due to right knee pain  Strong family history of coronary artery disease: Father had a quintuple bypass surgery at the age of 43 Patient states that all of his father's brothers and sisters have coronary artery disease to a certain extent. Patient's younger brother had a myocardial infarction at the age of 63.  Patient has been a diabetic since 2007.  No active chest pain.  Review of Systems: .   Review of Systems  Constitutional: Positive for malaise/fatigue.  Cardiovascular:  Positive for chest pain. Negative for claudication, irregular heartbeat, leg swelling, near-syncope, orthopnea, palpitations, paroxysmal nocturnal dyspnea and syncope.  Respiratory:  Negative for shortness of breath.   Hematologic/Lymphatic: Negative for bleeding problem.    Studies Reviewed:   EKG: EKG Interpretation Date/Time:    Ventricular Rate:    PR Interval:    QRS Duration:    QT Interval:    QTC Calculation:   R Axis:      Text Interpretation:    Echocardiogram: None   Coronary calcium  score: 11/10/2023 Coronary calcium  score of 503. This was 19 th percentile for age and sex matched control.  Cardiac PET/CT 03/28/2024   The study shows no epicardial disease.  Presence of decreased flow reserve can be seen in microvascular disease, though motion artifact noted on flow reserved evaluation. The study is intermediate risk due to flow reserve.   LV perfusion is normal. There is no evidence of epicardial ischemia. There is no evidence of infarction.   Rest left ventricular function is normal. Stress left ventricular function is normal. Stress EF: 55%. End diastolic cavity size is normal. End systolic cavity size is normal.   Myocardial blood flow was computed to be 0.36ml/g/min at rest and 1.27ml/g/min at stress.  Global myocardial blood flow reserve was 1.79 and was mildly abnormal.   Coronary calcium  was present on the attenuation correction CT images. Severe  coronary calcifications were present. Coronary calcifications were present in the left anterior descending artery and left circumflex artery distribution(s). Aortic atherosclerosis.   Electronically Signed  By: Stanly Leavens M.D. Non-cardiac findings:  1. No significant non-cardiac findings. 2. Stable 3 mm subpleural right middle lobe nodule and 5 mm right minor fissural nodule have previous demonstrated long-term stability and require no further follow-up. 3. Hepatic steatosis.  RADIOLOGY: NA  Risk Assessment/Calculations:   NA   Labs:       Latest Ref Rng & Units 04/10/2024   10:48 AM 03/04/2023    4:01 AM 03/03/2023    3:45 AM  CBC  WBC 3.4 - 10.8 x10E3/uL 8.3  17.2  16.3   Hemoglobin 13.0 - 17.7 g/dL 82.8  86.0  85.2   Hematocrit 37.5 - 51.0 % 52.3  40.3  42.8   Platelets 150 - 450 x10E3/uL 282  193  205        Latest Ref Rng & Units 04/10/2024   10:48 AM 12/08/2023   11:23 AM 03/05/2023    4:00 AM  BMP  Glucose 70 - 99 mg/dL 92  848  818   BUN 8 - 27 mg/dL 10  9  10    Creatinine 0.76 - 1.27 mg/dL 9.13  9.19  9.07   BUN/Creat Ratio 10 - 24 12     Sodium 134 - 144 mmol/L 141  136  132   Potassium 3.5 - 5.2 mmol/L 4.8  3.9  3.2   Chloride 96 - 106 mmol/L 103  104  96   CO2 20 - 29 mmol/L 19  22  25    Calcium  8.6 - 10.2 mg/dL 9.7  9.0  8.7       Latest Ref Rng & Units 04/10/2024   10:48 AM 12/08/2023   11:23 AM 03/05/2023    4:00 AM  CMP  Glucose 70 - 99 mg/dL 92  848  818   BUN 8 - 27 mg/dL 10  9  10    Creatinine 0.76 - 1.27 mg/dL 9.13  9.19  9.07   Sodium 134 - 144 mmol/L 141  136  132   Potassium 3.5 - 5.2 mmol/L 4.8  3.9  3.2   Chloride 96 - 106 mmol/L 103  104  96   CO2 20 - 29 mmol/L 19  22  25    Calcium  8.6 - 10.2 mg/dL 9.7  9.0  8.7     Lab Results  Component Value Date   CHOL 87 (L) 11/26/2022   HDL 33 (L) 11/26/2022   LDLCALC 24 11/26/2022   TRIG 183 (H) 11/26/2022   CHOLHDL 2.6 11/26/2022   No results for input(s): LIPOA in the last 8760  hours. No components found for: NTPROBNP No results for input(s): PROBNP in the last 8760 hours. No results for input(s): TSH in the last 8760 hours.  Physical Exam:    Today's Vitals   04/16/24 0551 04/16/24 0648 04/16/24 0730 04/16/24 0737  BP: 132/72     Pulse: 85     Resp: 16     Temp: 99.1 F (37.3 C)     TempSrc: Oral     SpO2: 95%   98%  Weight: 122.5 kg     Height: 5' 11 (1.803 m)     PainSc:  0-No pain 0-No pain    Body  mass index is 37.66 kg/m. Wt Readings from Last 3 Encounters:  04/16/24 122.5 kg  04/10/24 125.8 kg  01/02/24 124.3 kg    Physical Exam  Constitutional: No distress.  hemodynamically stable  Neck: No JVD present.  Cardiovascular: Normal rate, regular rhythm, S1 normal and S2 normal. Exam reveals no gallop, no S3 and no S4.  No murmur heard. Pulmonary/Chest: Effort normal and breath sounds normal. No stridor. He has no wheezes. He has no rales.  Musculoskeletal:        General: No edema.     Cervical back: Neck supple.  Skin: Skin is warm.     Impression & Recommendation(s):  Impression: No diagnosis found.    Recommendation(s):  Precordial pain Abnormal cardiovascular stress test Coronary atherosclerosis due to calcified coronary lesion Continues to have precordial pain with mixed features. Multiple cardiovascular risk factors which include but not limited to long history of insulin -dependent diabetes, severe coronary artery calcification, strong family history of premature coronary artery disease. Cardiac PET/CT considered to be intermediate risk study. EKG shows a sinus rhythm without underlying injury pattern Patient's pretest probability for having obstructive disease is at least intermediate to high. 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  After  discussing the risks, benefits, and alternatives patient would like to proceed with both, which is reasonable for reasons mentioned above. Antianginal therapy: Start Toprol -XL 25 mg p.o. daily, start nitroglycerin  tablets on as needed basis Drug profile discussed. Echo will be ordered to evaluate for structural heart disease and left ventricular systolic function. Educated him on seeking medical attention sooner by going to the closest ER via EMS if the symptoms increase in intensity, frequency, duration, or has typical chest pain as discussed in the office.  Patient verbalized understanding.  Type 2 diabetes mellitus with hyperglycemia, with long-term current use of insulin  (HCC) Diagnosed with diabetes back in 2007. Currently on losartan, Repatha  Reemphasized importance of glycemic control.  Most recent hemoglobin A1c 8.9 as of March 07, 2024 from [facility] database  Hyperlipidemia associated with type 2 diabetes mellitus (HCC) Myalgia due to statin Currently on Repatha . Intolerant to statin therapy-rosuvastatin /pravastatin Most recent LDL 34 mg/dL as of June 2025  Benign hypertension Office blood pressures are well-controlled. Medications as discussed above Reemphasized importance of low-salt diet.  Family history of premature CAD Strong family history of premature coronary artery disease as noted above. Reemphasize importance of improving his modifiable cardiovascular risk factors.   Orders Placed:  Orders Placed This Encounter  Procedures   Glucose, capillary    Standing Status:   Standing    Number of Occurrences:   1   Informed Consent Details: Physician/Practitioner Attestation; Transcribe to consent form and obtain patient signature    Standing Status:   Standing    Number of Occurrences:   1    Physician/Practitioner attestation of informed consent for procedure/surgical case:   I, the physician/practitioner, attest that I have discussed with the patient the benefits, risks,  side effects, alternatives, likelihood of achieving goals and potential problems during recovery for the procedure that I have provided informed consent.    Procedure:   Left Heart Cath and Coronary Angiography with possible Percutaneous Coronary Intervention    Physician/Practitioner performing the procedure:   Dr Newman Lawrence    Indication/Reason:   Precordial pain /Abnormal cardiovascular stress test   Apply Cardiac or  Vascular Catheterization and/or Intervention Care Plan    Standing Status:   Standing    Number of Occurrences:   1   Confirm CBC and BMP (or CMP) results within 7 days for inpatient and 30 days for outpatient: Outpatients with severe anemia (hgb<10, CKD, severe thrombocytopenia plts<100) labs should be within 10 days. Only draw PT/INR on patients that are on Coumadin, Hgb<10, have liver disease (cirrhosis, liver CA, hepatitis, etc). Urine pregnancy test within hospital admission for inpatients of child bearing age, for outpatients day of procedure.    Standing Status:   Standing    Number of Occurrences:   1   Confirm EKG performed within 30 days for cardiac procedures and 12 months for peripheral vascular procedures.  Place order for EKG if missing or not within timeframe.    Standing Status:   Standing    Number of Occurrences:   1   Verify aspirin  and / or anti-platelet medication (Plavix, Effient, Brilinta) dose available for cardiac / peripheral vascular procedure day. IF ordered daily / once, adjust schedule to administer before procedure.    Standing Status:   Standing    Number of Occurrences:   1   Weigh patient    Standing Status:   Standing    Number of Occurrences:   1   Initiate Cath/PCI clinical path; encourage patient to watch CCTV video    encourage patient to watch CCTV video    Standing Status:   Standing    Number of Occurrences:   1   Insert peripheral IV    Avoid wrist if possible.    Standing Status:   Standing    Number of Occurrences:   1    Insert 2nd peripheral IV site-Saline lock IV    If scheduled for left and right heart cath or right heart cath, please insert 20 gauge NSL in Rt AC    Standing Status:   Standing    Number of Occurrences:   1     Final Medication List:    Meds ordered this encounter  Medications   aspirin  chewable tablet 81 mg   FOLLOWED BY Linked Order Group    0.9% sodium chloride  infusion    0.9% sodium chloride  infusion    Medications Discontinued During This Encounter  Medication Reason   cetirizine (ZYRTEC) 10 MG tablet Change in therapy   COENZYME Q10 PO Dose change   Omega-3 Fatty Acids (FISH OIL) 1000 MG CAPS Dose change   Specialty Vitamins Products (ECHINACEA C COMPLETE PO) Patient Preference   ibuprofen  (ADVIL ) 800 MG tablet Dose change   methocarbamol  (ROBAXIN ) 500 MG tablet Patient Preference     Current Facility-Administered Medications:    [EXPIRED] 0.9% sodium chloride  infusion, 3 mL/kg/hr, Intravenous, Continuous **FOLLOWED BY** 0.9% sodium chloride  infusion, 1 mL/kg/hr, Intravenous, Continuous, Tolia, Sunit, DO  Consent:   Informed Consent   Shared Decision Making/Informed Consent The risks [stroke (1 in 1000), death (1 in 1000), kidney failure [usually temporary] (1 in 500), bleeding (1 in 200), allergic reaction [possibly serious] (1 in 200)], benefits (diagnostic support and management of coronary artery disease) and alternatives of a cardiac catheterization were discussed in detail with Mr. Sanluis and he is willing to proceed.     Disposition:   3 months or sooner if needed  His questions and concerns were addressed to his satisfaction. He voices understanding of the recommendations provided during this encounter.    Signed, Madonna Large, DO, [facility]  A Division of [facility] [address], [place], [place] [address]  [place], [place] [address] 04/16/2024 7:38 AM

## 2024-04-17 ENCOUNTER — Ambulatory Visit: Admitting: Gastroenterology

## 2024-04-23 DIAGNOSIS — M9903 Segmental and somatic dysfunction of lumbar region: Secondary | ICD-10-CM | POA: Diagnosis not present

## 2024-04-23 DIAGNOSIS — M546 Pain in thoracic spine: Secondary | ICD-10-CM | POA: Diagnosis not present

## 2024-04-23 DIAGNOSIS — M9901 Segmental and somatic dysfunction of cervical region: Secondary | ICD-10-CM | POA: Diagnosis not present

## 2024-04-23 DIAGNOSIS — M9902 Segmental and somatic dysfunction of thoracic region: Secondary | ICD-10-CM | POA: Diagnosis not present

## 2024-04-23 DIAGNOSIS — M5412 Radiculopathy, cervical region: Secondary | ICD-10-CM | POA: Diagnosis not present

## 2024-04-24 DIAGNOSIS — E1165 Type 2 diabetes mellitus with hyperglycemia: Secondary | ICD-10-CM | POA: Diagnosis not present

## 2024-04-24 DIAGNOSIS — I2510 Atherosclerotic heart disease of native coronary artery without angina pectoris: Secondary | ICD-10-CM | POA: Diagnosis not present

## 2024-04-24 DIAGNOSIS — E1169 Type 2 diabetes mellitus with other specified complication: Secondary | ICD-10-CM | POA: Diagnosis not present

## 2024-04-24 DIAGNOSIS — E669 Obesity, unspecified: Secondary | ICD-10-CM | POA: Diagnosis not present

## 2024-04-24 DIAGNOSIS — K760 Fatty (change of) liver, not elsewhere classified: Secondary | ICD-10-CM | POA: Diagnosis not present

## 2024-04-24 DIAGNOSIS — I10 Essential (primary) hypertension: Secondary | ICD-10-CM | POA: Diagnosis not present

## 2024-04-25 ENCOUNTER — Ambulatory Visit (HOSPITAL_COMMUNITY)
Admission: RE | Admit: 2024-04-25 | Discharge: 2024-04-25 | Disposition: A | Discharge status: Home / Self Care | Source: Ambulatory Visit | Attending: Cardiology | Admitting: Cardiology

## 2024-04-25 DIAGNOSIS — R9439 Abnormal result of other cardiovascular function study: Secondary | ICD-10-CM | POA: Diagnosis not present

## 2024-04-25 DIAGNOSIS — R072 Precordial pain: Secondary | ICD-10-CM | POA: Insufficient documentation

## 2024-04-25 DIAGNOSIS — I10 Essential (primary) hypertension: Secondary | ICD-10-CM | POA: Insufficient documentation

## 2024-04-25 LAB — ECHOCARDIOGRAM COMPLETE
AR max vel: 3.11 cm2
AV Area VTI: 3.17 cm2
AV Area mean vel: 3.19 cm2
AV Mean grad: 4 mmHg
AV Peak grad: 7.7 mmHg
Ao pk vel: 1.39 m/s
Area-P 1/2: 4.57 cm2
S' Lateral: 2.6 cm

## 2024-04-25 NOTE — Progress Notes (Signed)
*  PRELIMINARY RESULTS* Echocardiogram 2D Echocardiogram has been performed.  Roger Guerra 04/25/2024, 4:21 PM

## 2024-05-07 DIAGNOSIS — M9903 Segmental and somatic dysfunction of lumbar region: Secondary | ICD-10-CM | POA: Diagnosis not present

## 2024-05-07 DIAGNOSIS — M9901 Segmental and somatic dysfunction of cervical region: Secondary | ICD-10-CM | POA: Diagnosis not present

## 2024-05-07 DIAGNOSIS — M9902 Segmental and somatic dysfunction of thoracic region: Secondary | ICD-10-CM | POA: Diagnosis not present

## 2024-05-07 DIAGNOSIS — M5412 Radiculopathy, cervical region: Secondary | ICD-10-CM | POA: Diagnosis not present

## 2024-05-07 DIAGNOSIS — M546 Pain in thoracic spine: Secondary | ICD-10-CM | POA: Diagnosis not present

## 2024-05-09 ENCOUNTER — Ambulatory Visit: Admitting: Gastroenterology

## 2024-05-09 VITALS — BP 135/84 | HR 83 | Temp 98.6°F | Ht 71.0 in | Wt 275.2 lb

## 2024-05-09 DIAGNOSIS — K2100 Gastro-esophageal reflux disease with esophagitis, without bleeding: Secondary | ICD-10-CM

## 2024-05-09 NOTE — Patient Instructions (Signed)
 You can continue pantoprazole twice a day. It is best when taken 30 minutes before meals. If possible, decrease to once a day if tolerated. If not, you can resume twice a day.  Try to avoid non-steroidals if possible such as Ibuprofen, advil, aleve, etc. If you need to take, try and alternate with tylenol.  Reach out to us  if any recurrent problems swallowing!  We will see you in 1 year!  Happy early birthday!  It was a pleasure to see you today. I want to create trusting relationships with patients and provide genuine, compassionate, and quality care. I truly value your feedback, so please be on the lookout for a survey regarding your visit with me today. I appreciate your time in completing this!         Therisa MICAEL Stager, PhD, ANP-BC [facility]

## 2024-05-09 NOTE — Progress Notes (Signed)
 Gastroenterology Office Note     Primary Care Physician:  Shona Norleen PEDLAR, MD  Primary Gastroenterologist: Dr. Cindie   Chief Complaint   Chief Complaint  Patient presents with   Follow-up    Follow up GERD and endoscopy results     History of Present Illness   Roger Guerra is a 68 y.o. male presenting today with a history of chronic GERD, recently seen earlier this year with dysphagia and underwent EGD with dilation as noted below with findings of LA Grade A reflux    EGD April 2025: LA Grade Reflux esophagitis s/p biopsy, dilation, gastritis s/p biopsy, gastric erosion without bleeding s/p biopsy, normal duodenum. Path: negative H.pylori, no Barrett's.    Has been on Nexium, omeprazole. Pantoprazole BID currently. Feels much improved. No abdominal pain. No N/V. Has been able to eat hot sauce without issues. No overt GI bleeding or lower GI signs/symptoms.   Cervical radiculopathy. Ibuprofen. Wakes up numb and burning. Arthiritc pain. Takes ibuprofen   2023 Colonoscopy: normal colon. Hx of polyps in past. Recommend in 7 years. Colonoscopy in 2030.     Past Medical History:  Diagnosis Date   Anxiety disorder    Arthritis    Atherosclerosis    Back pain    Cervical radiculopathy    Constipation    Coronary artery calcification seen on CT scan    Depression    Diabetes (HCC)    Erectile dysfunction    GERD (gastroesophageal reflux disease)    Heartburn    History of blood clots    History of DVT (deep vein thrombosis)    History of DVT of lower extremity    Right leg 2002    History of pneumonia    Hyperlipidemia    Hypogonadism in male    Hypopituitarism (HCC)    Joint pain    Myocardial infarction (HCC)    Nerve root and plexus disorder    Obesity    Obstructive sleep apnea    OSA on CPAP    Osteoarthritis    Sciatica    Sleep apnea    SOBOE (shortness of breath on exertion)    Spinal stenosis    Spondylosis    Swelling of both lower  extremities    Type 2 diabetes mellitus (HCC)    Vitamin D deficiency     Past Surgical History:  Procedure Laterality Date   BLADDER SURGERY  2015   removal of skin tag near urethra   COLONOSCOPY N/A 12/10/2016   Procedure: COLONOSCOPY;  Surgeon: Lamar CHRISTELLA Hollingshead, MD;  [facility]: [facility];  Service: Endoscopy;  Laterality: N/A;  8:30 AM   COLONOSCOPY WITH PROPOFOL N/A 04/29/2022   Procedure: COLONOSCOPY WITH PROPOFOL;  Surgeon: Hollingshead Lamar CHRISTELLA, MD;  Location: [facility];  Service: Endoscopy;  Laterality: N/A;  8:00am   CORONARY PRESSURE/FFR STUDY N/A 04/16/2024   Procedure: CORONARY PRESSURE/FFR STUDY;  Surgeon: Elmira Newman PARAS, MD;  Location: [facility];  Service: Cardiovascular;  Laterality: N/A;   CYSTOSCOPY WITH INSERTION OF UROLIFT N/A 12/12/2023   Procedure: CYSTOSCOPY WITH INSERTION OF UROLIFT;  Surgeon: Sherrilee Belvie CROME, MD;  Location: [facility];  Service: Urology;  Laterality: N/A;   ESOPHAGEAL DILATION N/A 12/29/2023   Procedure: DILATION, ESOPHAGUS;  Surgeon: Cindie Carlin POUR, DO;  [facility]: [facility];  Service: Endoscopy;  Laterality: N/A;   ESOPHAGOGASTRODUODENOSCOPY N/A 12/29/2023   Procedure: EGD (ESOPHAGOGASTRODUODENOSCOPY);  Surgeon: Cindie Carlin POUR, DO;  [facility];  Service: Endoscopy;  Laterality: N/A;  11:15am, asa 2   KNEE ARTHROSCOPY WITH MEDIAL MENISECTOMY Right 08/25/2017   Procedure: Right knee arthroscopy, partial medial menisectomy and debridement;  Surgeon: Duwayne Purchase, MD;  Location: [facility];  Service: Orthopedics;  Laterality: Right;  60 mins   LEFT HEART CATH AND CORONARY ANGIOGRAPHY N/A 04/16/2024   Procedure: LEFT HEART CATH AND CORONARY ANGIOGRAPHY;  Surgeon: Elmira Newman PARAS, MD;  [facility]: [facility];  Service: Cardiovascular;  Laterality: N/A;   POLYPECTOMY  12/10/2016   Procedure: POLYPECTOMY;  Surgeon: Lamar CHRISTELLA Hollingshead, MD;  [facility]: [facility];  Service: Endoscopy;;  hepatic flexure   PTOSIS REPAIR  Bilateral 02/2022   TONSILLECTOMY     TOTAL KNEE ARTHROPLASTY Left 03/02/2023   Procedure: TOTAL KNEE ARTHROPLASTY;  Surgeon: Duwayne Purchase, MD;  Location: [facility];  Service: Orthopedics;  Laterality: Left;   WISDOM TOOTH EXTRACTION      Current Outpatient Medications  Medication Sig Dispense Refill   5-Hydroxytryptophan (5-HTP PO) Take 100 mg by mouth at bedtime.     acetaminophen (TYLENOL) 500 MG tablet Take 1,000 mg by mouth every 6 (six) hours as needed for moderate pain (pain score 4-6).     AMBULATORY NON FORMULARY MEDICATION Medication Name: Trimix  PGE 30 mcg Pap 30 mg Phent 1 mg 5 mL 5   Ascorbic Acid (VITAMIN C) 1000 MG tablet Take 1,000 mg by mouth in the morning.     aspirin EC 81 MG tablet Take 81 mg by mouth in the morning. Swallow whole.     carisoprodol (SOMA) 350 MG tablet Take 350 mg by mouth 3 (three) times daily as needed for muscle spasms.     chlorpheniramine (CHLOR-TRIMETON) 4 MG tablet Take 4 mg by mouth every evening.     Cholecalciferol (VITAMIN D3) 125 MCG (5000 UT) CAPS Take 5,000 Units by mouth in the morning.     Coenzyme Q10 (CO Q-10) 200 MG CAPS Take 200 mg by mouth daily.     Dulaglutide (TRULICITY) 3 MG/0.5ML SOPN Inject 3 mg as directed once a week. **NEEDS TO BE SEEN BEFORE NEXT REFILL** 2 mL 0   Evolocumab (REPATHA SURECLICK) 140 MG/ML SOAJ Inject 140 mg into the skin every 14 (fourteen) days. 6 mL 3   fexofenadine (ALLEGRA) 180 MG tablet Take 180 mg by mouth every morning.      glipiZIDE (GLUCOTROL) 10 MG tablet Take 10 mg by mouth daily before breakfast.     glucose blood (ONETOUCH ULTRA) test strip check blood sugar once daily. DX:E11.69 100 each 3   ibuprofen (ADVIL) 200 MG tablet Take 800 mg by mouth every 6 (six) hours as needed for moderate pain (pain score 4-6).     Insulin Pen Needle (PEN NEEDLES) 31G X 6 MM MISC 1 each by Does not apply route daily. 100 each 1   LANTUS SOLOSTAR 100 UNIT/ML Solostar Pen Inject 50-60 Units into the skin at  bedtime.     losartan (COZAAR) 50 MG tablet Take 50 mg by mouth daily.     MAGNESIUM BISGLYCINATE PO Take 72 mg by mouth in the morning.     metoprolol succinate (TOPROL XL) 25 MG 24 hr tablet Take 1 tablet (25 mg total) by mouth daily. 30 tablet 3   Multiple Vitamin (MULTIVITAMIN WITH MINERALS) TABS tablet Take 1 tablet by mouth in the morning. Centrum     nitroGLYCERIN (NITROSTAT) 0.4 MG SL tablet Place 1 tablet (0.4 mg total)  under the tongue every 5 (five) minutes as needed for chest pain. 30 tablet 0   Omega-3 Fatty Acids (OMEGA 3 PO) Take 400 mg by mouth daily.     pantoprazole (PROTONIX) 40 MG tablet Take 1 tablet (40 mg total) by mouth 2 (two) times daily. 60 tablet 11   Probiotic Product (PROBIOTIC PO) Take 1 capsule by mouth in the morning.     silodosin (RAPAFLO) 8 MG CAPS capsule Take 1 capsule (8 mg total) by mouth daily with breakfast. 30 capsule 3   sodium chloride (OCEAN) 0.65 % SOLN nasal spray Place 1 spray into both nostrils 2 (two) times daily.     SYRINGE-NEEDLE, DISP, 3 ML (LUER LOCK SAFETY SYRINGES) 22G X 1 3 ML MISC For testosterone  injections 30 each 1   testosterone cypionate (DEPOTESTOSTERONE CYPIONATE) 200 MG/ML injection Inject 200 mg into the muscle See admin instructions. Every 10 days     vitamin E 180 MG (400 UNITS) capsule Take 400 Units by mouth in the morning.     ZINC PICOLINATE PO Take 50 mg by mouth in the morning.     No current facility-administered medications for this visit.    Allergies as of 05/09/2024 - Review Complete 05/09/2024  Allergen Reaction Noted   Invokana [canagliflozin] Other (See Comments) 07/28/2015   Jardiance [empagliflozin]  04/11/2024   Statins Other (See Comments) 09/08/2021    Family History  Problem Relation Age of Onset   Diabetes Mother    Lung cancer Father    Heart disease Father    Stroke Father    Cancer Father    Diabetes Sister    Diabetes Other        Siblings    Social History   Socioeconomic History    Marital status: Married    Spouse name: Biruk Troia   Number of children: 2   Years of education: Not on file   Highest education level: Master's degree (e.g., MA, MS, MEng, MEd, MSW, MBA)  Occupational History   Occupation: Retired Runner, broadcasting/film/video but work part-time teaching  Tobacco Use   Smoking status: Former    Current packs/day: 2.50    Average packs/day: 2.5 packs/day for 15.0 years (37.5 ttl pk-yrs)    Types: Cigarettes   Smokeless tobacco: Never  Vaping Use   Vaping status: Never Used  Substance and Sexual Activity   Alcohol use: No    Alcohol/week: 0.0 standard drinks of alcohol   Drug use: No   Sexual activity: Not on file  Other Topics Concern   Not on file  Social History Narrative   Not on file   Social Drivers of Health   Financial Resource Strain: Patient Declined (12/17/2022)   Overall Financial Resource Strain (CARDIA)    Difficulty of Paying Living Expenses: Patient declined  Food Insecurity: No Food Insecurity (03/08/2023)   Hunger Vital Sign    Worried About Running Out of Food in the Last Year: Never true    Ran Out of Food in the Last Year: Never true  Transportation Needs: No Transportation Needs (03/08/2023)   PRAPARE - Administrator, Civil Service (Medical): No    Lack of Transportation (Non-Medical): No  Physical Activity: Insufficiently Active (12/17/2022)   Exercise Vital Sign    Days of Exercise per Week: 3 days    Minutes of Exercise per Session: 30 min  Stress: No Stress Concern Present (12/17/2022)   [employer] of Occupational Health - Occupational Stress Questionnaire  Feeling of Stress : Not at all  Social Connections: Unknown (12/17/2022)   Social Connection and Isolation Panel    Frequency of Communication with Friends and Family: Patient declined    Frequency of Social Gatherings with Friends and Family: Patient declined    Attends Religious Services: Patient declined    Database administrator or Organizations:  Patient declined    Attends Banker Meetings: Not on file    Marital Status: Married  Intimate Partner Violence: Not At Risk (03/02/2023)   Humiliation, Afraid, Rape, and Kick questionnaire    Fear of Current or Ex-Partner: No    Emotionally Abused: No    Physically Abused: No    Sexually Abused: No     Review of Systems   Gen: Denies any fever, chills, fatigue, weight loss, lack of appetite.  CV: Denies chest pain, heart palpitations, peripheral edema, syncope.  Resp: Denies shortness of breath at rest or with exertion. Denies wheezing or cough.  GI: Denies dysphagia or odynophagia. Denies jaundice, hematemesis, fecal incontinence. GU : Denies urinary burning, urinary frequency, urinary hesitancy MS: Denies joint pain, muscle weakness, cramps, or limitation of movement.  Derm: Denies rash, itching, dry skin Psych: Denies depression, anxiety, memory loss, and confusion Heme: Denies bruising, bleeding, and enlarged lymph nodes.   Physical Exam   BP 135/84   Pulse 83   Temp 98.6 F (37 C)   Ht 5' 11 (1.803 m)   Wt 275 lb 3.2 oz (124.8 kg)   BMI 38.38 kg/m  General:   Alert and oriented. Pleasant and cooperative. Well-nourished and well-developed.  Head:  Normocephalic and atraumatic. Eyes:  Without icterus Abdomen:  +BS, soft, non-tender and non-distended. No HSM noted. No guarding or rebound. No masses appreciated.  Rectal:  Deferred  Msk:  Symmetrical without gross deformities. Normal posture. Extremities:  Without edema. Neurologic:  Alert and  oriented x4;  grossly normal neurologically. Skin:  Intact without significant lesions or rashes. Psych:  Alert and cooperative. Normal mood and affect.   Assessment   Roger Guerra is a 68 y.o. male presenting today with a history of  chronic GERD, Grade A esophagitis, dysphagia s/p dilation in April 2025, returning in follow-up.  GERD: doing wonderfully on BID PPI. No breakthrough symptoms, and dysphagia has  resolved. He does have to take NSAIDs intermittently with history of chronic joint pain. We discussed limiting this as much as possible. Can attempt to wean down to once daily PPI but if not tolerated, can do BID.   Colonoscopy will be due for surveillance in2030. NO concerning signs/symptoms.  PLAN   Pantoprazole  BID, attempt to wean to once daily. If not tolerated, can continue BID Avoid NSAIDs Call if recurrent dysphagia 1 year follow-up Colonoscopy 2030   Therisa MICAEL Stager, PhD, [facility] Gastroenterology

## 2024-05-14 DIAGNOSIS — B9689 Other specified bacterial agents as the cause of diseases classified elsewhere: Secondary | ICD-10-CM | POA: Diagnosis not present

## 2024-05-14 DIAGNOSIS — J019 Acute sinusitis, unspecified: Secondary | ICD-10-CM | POA: Diagnosis not present

## 2024-05-23 DIAGNOSIS — M5412 Radiculopathy, cervical region: Secondary | ICD-10-CM | POA: Diagnosis not present

## 2024-05-23 DIAGNOSIS — M9902 Segmental and somatic dysfunction of thoracic region: Secondary | ICD-10-CM | POA: Diagnosis not present

## 2024-05-23 DIAGNOSIS — M546 Pain in thoracic spine: Secondary | ICD-10-CM | POA: Diagnosis not present

## 2024-05-23 DIAGNOSIS — M9903 Segmental and somatic dysfunction of lumbar region: Secondary | ICD-10-CM | POA: Diagnosis not present

## 2024-05-23 DIAGNOSIS — M9901 Segmental and somatic dysfunction of cervical region: Secondary | ICD-10-CM | POA: Diagnosis not present

## 2024-06-04 DIAGNOSIS — M5412 Radiculopathy, cervical region: Secondary | ICD-10-CM | POA: Diagnosis not present

## 2024-06-04 DIAGNOSIS — M9901 Segmental and somatic dysfunction of cervical region: Secondary | ICD-10-CM | POA: Diagnosis not present

## 2024-06-04 DIAGNOSIS — M546 Pain in thoracic spine: Secondary | ICD-10-CM | POA: Diagnosis not present

## 2024-06-04 DIAGNOSIS — M9903 Segmental and somatic dysfunction of lumbar region: Secondary | ICD-10-CM | POA: Diagnosis not present

## 2024-06-04 DIAGNOSIS — M9902 Segmental and somatic dysfunction of thoracic region: Secondary | ICD-10-CM | POA: Diagnosis not present

## 2024-06-08 ENCOUNTER — Other Ambulatory Visit: Payer: Self-pay | Admitting: Internal Medicine

## 2024-06-08 DIAGNOSIS — E1169 Type 2 diabetes mellitus with other specified complication: Secondary | ICD-10-CM

## 2024-06-08 DIAGNOSIS — I2510 Atherosclerotic heart disease of native coronary artery without angina pectoris: Secondary | ICD-10-CM

## 2024-06-18 DIAGNOSIS — M9901 Segmental and somatic dysfunction of cervical region: Secondary | ICD-10-CM | POA: Diagnosis not present

## 2024-06-18 DIAGNOSIS — M546 Pain in thoracic spine: Secondary | ICD-10-CM | POA: Diagnosis not present

## 2024-06-18 DIAGNOSIS — M9903 Segmental and somatic dysfunction of lumbar region: Secondary | ICD-10-CM | POA: Diagnosis not present

## 2024-06-18 DIAGNOSIS — M9902 Segmental and somatic dysfunction of thoracic region: Secondary | ICD-10-CM | POA: Diagnosis not present

## 2024-06-18 DIAGNOSIS — M5412 Radiculopathy, cervical region: Secondary | ICD-10-CM | POA: Diagnosis not present

## 2024-06-27 DIAGNOSIS — I10 Essential (primary) hypertension: Secondary | ICD-10-CM | POA: Diagnosis not present

## 2024-06-27 DIAGNOSIS — E559 Vitamin D deficiency, unspecified: Secondary | ICD-10-CM | POA: Diagnosis not present

## 2024-06-27 DIAGNOSIS — E1169 Type 2 diabetes mellitus with other specified complication: Secondary | ICD-10-CM | POA: Diagnosis not present

## 2024-06-27 DIAGNOSIS — R7989 Other specified abnormal findings of blood chemistry: Secondary | ICD-10-CM | POA: Diagnosis not present

## 2024-06-28 DIAGNOSIS — G4733 Obstructive sleep apnea (adult) (pediatric): Secondary | ICD-10-CM | POA: Diagnosis not present

## 2024-07-03 LAB — LAB REPORT - SCANNED
A1c: 6.8
Albumin, Urine POC: 10.4
Creatinine, POC: 64.4 mg/dL
EGFR: 98
Microalb Creat Ratio: 16

## 2024-07-04 DIAGNOSIS — E1169 Type 2 diabetes mellitus with other specified complication: Secondary | ICD-10-CM | POA: Diagnosis not present

## 2024-07-04 DIAGNOSIS — Z23 Encounter for immunization: Secondary | ICD-10-CM | POA: Diagnosis not present

## 2024-07-04 DIAGNOSIS — Z0000 Encounter for general adult medical examination without abnormal findings: Secondary | ICD-10-CM | POA: Diagnosis not present

## 2024-07-04 DIAGNOSIS — I2510 Atherosclerotic heart disease of native coronary artery without angina pectoris: Secondary | ICD-10-CM | POA: Diagnosis not present

## 2024-07-04 DIAGNOSIS — K2100 Gastro-esophageal reflux disease with esophagitis, without bleeding: Secondary | ICD-10-CM | POA: Diagnosis not present

## 2024-07-04 DIAGNOSIS — G4733 Obstructive sleep apnea (adult) (pediatric): Secondary | ICD-10-CM | POA: Diagnosis not present

## 2024-07-04 DIAGNOSIS — N401 Enlarged prostate with lower urinary tract symptoms: Secondary | ICD-10-CM | POA: Diagnosis not present

## 2024-07-04 DIAGNOSIS — G5603 Carpal tunnel syndrome, bilateral upper limbs: Secondary | ICD-10-CM | POA: Diagnosis not present

## 2024-07-04 DIAGNOSIS — I10 Essential (primary) hypertension: Secondary | ICD-10-CM | POA: Diagnosis not present

## 2024-07-04 DIAGNOSIS — Z86718 Personal history of other venous thrombosis and embolism: Secondary | ICD-10-CM | POA: Diagnosis not present

## 2024-07-04 DIAGNOSIS — K760 Fatty (change of) liver, not elsewhere classified: Secondary | ICD-10-CM | POA: Diagnosis not present

## 2024-07-04 DIAGNOSIS — Z0001 Encounter for general adult medical examination with abnormal findings: Secondary | ICD-10-CM | POA: Diagnosis not present

## 2024-07-05 ENCOUNTER — Ambulatory Visit: Payer: Self-pay | Admitting: Cardiology

## 2024-07-09 ENCOUNTER — Other Ambulatory Visit: Payer: Self-pay

## 2024-07-09 DIAGNOSIS — M9901 Segmental and somatic dysfunction of cervical region: Secondary | ICD-10-CM | POA: Diagnosis not present

## 2024-07-09 DIAGNOSIS — M546 Pain in thoracic spine: Secondary | ICD-10-CM | POA: Diagnosis not present

## 2024-07-09 DIAGNOSIS — M9902 Segmental and somatic dysfunction of thoracic region: Secondary | ICD-10-CM | POA: Diagnosis not present

## 2024-07-09 DIAGNOSIS — M9903 Segmental and somatic dysfunction of lumbar region: Secondary | ICD-10-CM | POA: Diagnosis not present

## 2024-07-09 DIAGNOSIS — N401 Enlarged prostate with lower urinary tract symptoms: Secondary | ICD-10-CM

## 2024-07-09 DIAGNOSIS — M5412 Radiculopathy, cervical region: Secondary | ICD-10-CM | POA: Diagnosis not present

## 2024-07-10 ENCOUNTER — Telehealth: Payer: Self-pay | Admitting: Pharmacy Technician

## 2024-07-10 NOTE — Telephone Encounter (Signed)
   Pharmacy Patient Advocate Encounter   Received notification from Onbase that prior authorization for REPATHA  is required/requested.   Insurance verification completed.   The patient is insured through [facility] ADVANTAGE/RX ADVANCE.   Per test claim: PA required; PA submitted to above mentioned insurance via Latent Key/confirmation #/EOC [id number] Status is pending

## 2024-07-11 ENCOUNTER — Telehealth: Payer: Self-pay

## 2024-07-11 ENCOUNTER — Ambulatory Visit: Attending: Cardiovascular Disease | Admitting: Cardiology

## 2024-07-11 ENCOUNTER — Encounter: Payer: Self-pay | Admitting: Cardiology

## 2024-07-11 VITALS — BP 127/77 | HR 87 | Ht 71.0 in | Wt 278.0 lb

## 2024-07-11 DIAGNOSIS — E1169 Type 2 diabetes mellitus with other specified complication: Secondary | ICD-10-CM

## 2024-07-11 DIAGNOSIS — M791 Myalgia, unspecified site: Secondary | ICD-10-CM

## 2024-07-11 DIAGNOSIS — E785 Hyperlipidemia, unspecified: Secondary | ICD-10-CM

## 2024-07-11 DIAGNOSIS — T466X5D Adverse effect of antihyperlipidemic and antiarteriosclerotic drugs, subsequent encounter: Secondary | ICD-10-CM

## 2024-07-11 DIAGNOSIS — E1165 Type 2 diabetes mellitus with hyperglycemia: Secondary | ICD-10-CM | POA: Diagnosis not present

## 2024-07-11 DIAGNOSIS — I2585 Chronic coronary microvascular dysfunction: Secondary | ICD-10-CM

## 2024-07-11 DIAGNOSIS — Z794 Long term (current) use of insulin: Secondary | ICD-10-CM | POA: Diagnosis not present

## 2024-07-11 DIAGNOSIS — I2584 Coronary atherosclerosis due to calcified coronary lesion: Secondary | ICD-10-CM

## 2024-07-11 DIAGNOSIS — Z8249 Family history of ischemic heart disease and other diseases of the circulatory system: Secondary | ICD-10-CM

## 2024-07-11 DIAGNOSIS — T466X5A Adverse effect of antihyperlipidemic and antiarteriosclerotic drugs, initial encounter: Secondary | ICD-10-CM

## 2024-07-11 DIAGNOSIS — I2510 Atherosclerotic heart disease of native coronary artery without angina pectoris: Secondary | ICD-10-CM

## 2024-07-11 DIAGNOSIS — N401 Enlarged prostate with lower urinary tract symptoms: Secondary | ICD-10-CM

## 2024-07-11 DIAGNOSIS — I10 Essential (primary) hypertension: Secondary | ICD-10-CM

## 2024-07-11 MED ORDER — SILODOSIN 8 MG PO CAPS: 8.0000 mg | ORAL_CAPSULE | Freq: Every day | ORAL | 3 refills | Status: AC

## 2024-07-11 NOTE — Patient Instructions (Signed)
 Medication Instructions:  Your physician recommends that you continue on your current medications as directed. Please refer to the Current Medication list given to you today.  *If you need a refill on your cardiac medications before your next appointment, please call your pharmacy*  Lab Work: None ordered.  You may go to any [facility] Location for your lab work:  [facility] - [address] ([facility]) - [address] [phone] [address]  [place] - [address]   [place]  - [address]   [place] - [address] - [address] [employer]  - [address] [place] - [address] ([facility]   If you have labs (blood work) drawn today and your tests are completely normal, you will receive your results only by: [employer] (if you have MyChart)  If you have any lab test that is abnormal or we need to change your treatment, we will call you or send a MyChart message to review the results.  Testing/Procedures: None ordered.  Follow-Up: At [facility], you and your health needs are our priority.  As part of our continuing mission to provide you with exceptional heart care, we have created designated Provider Care Teams.  These Care Teams include your primary Cardiologist (physician) and Advanced Practice Providers (APPs -  Physician Assistants and Nurse Practitioners) who all work together to provide you with the care you need, when you need it.   Your next appointment:   1 year(s)  The format for your next appointment:   In Person  Provider:   Madonna Large, DO

## 2024-07-11 NOTE — Telephone Encounter (Signed)
 Pharmacy Patient Advocate Encounter  Received notification from [facility] ADVANTAGE/RX ADVANCE that Prior Authorization for repatha  has been APPROVED from 07/10/24 to 07/10/25   PA #/Case ID/Reference #: [id number]

## 2024-07-11 NOTE — Progress Notes (Signed)
 Cardiology Office Note:  .   Date:  07/11/2024  ID:  Roger Guerra, DOB 01-07-1956, MRN [id number] PCP:  Shona Norleen PEDLAR, MD  Former Cardiology Providers: Dr. Jayson Sierras [facility] Providers Cardiologist:  Madonna Large, DO , [facility] (established care 04/10/24) Electrophysiologist:  None  Click to update primary MD,subspecialty MD or APP then REFRESH:1}    Chief Complaint  Patient presents with   Follow-up    CAC and review test results    History of Present Illness: .   Roger Guerra is a 68 y.o. Caucasian male whose past medical history and cardiovascular risk factors includes: Dyslipidemia, statin intolerance, coronary calcification, insulin  dependent diabetes mellitus type 2, OSA, hypopituitarism, history of DVT, strong family history of premature coronary artery disease.  Patient is referred to practice for coronary artery calcification management.  He has prolonged history of dyslipidemia likely due to metabolic and hypopituitary pathophysiology.  Has been intolerant to statin therapy (specifically rosuvastatin  pravastatin).  He was then transitioned to Zetia  plus Praluent  and due to insurance coverage changes Praluent  was transitioned to Repatha .  In February 2025 patient had a coronary calcium  score which noted a total CAC of 503, at the 79th percentile.  In July 2025 patient underwent cardiac PET/CT which noted no significant epicardial disease, normal LV perfusion, and flow reserve decreased suggestive of possible microvascular disease.    In July 2025 when patient establish care he was endorsing precordial pain suggestive of mixed features, he has multiple cardiovascular risk factors, and therefore balanced ischemia could not be ruled out.  Patient was recommended to undergo left heart catheterization to evaluate for obstructive disease.    In July 2025 patient underwent left heart catheterization and was noted to have normal epicardial disease, reduced coronary flow  reserve concerning for microvascular dysfunction.  Echocardiogram noted preserved LVEF, RV size mildly enlarged and function mildly reduced, without any significant valvular heart disease.  Patient presents today for follow-up.  In October 2025 patient had labs with PCP which were independently reviewed as part of today's office visit and mentioned below for further reference.  Denies anginal chest pain or heart failure symptoms. Overall function capacity remains relatively stable. Patient states his symptoms have improved significantly after initiation of Toprol -XL  Strong family history of coronary artery disease: Father had a quintuple bypass surgery at the age of 3 Patient states that all of his father's brothers and sisters have coronary artery disease to a certain extent. Patient's younger brother had a myocardial infarction at the age of 41.  Patient has been a diabetic since 2007.  Review of Systems: .   Review of Systems  Constitutional: Positive for malaise/fatigue.  Cardiovascular:  Positive for chest pain. Negative for claudication, irregular heartbeat, leg swelling, near-syncope, orthopnea, palpitations, paroxysmal nocturnal dyspnea and syncope.  Respiratory:  Negative for shortness of breath.   Hematologic/Lymphatic: Negative for bleeding problem.    Studies Reviewed:   Echocardiogram: 04/25/2024  1. Left ventricular ejection fraction, by estimation, is 60 to 65%. The left ventricle has normal function. The left ventricle has no regional wall motion abnormalities. Left ventricular diastolic parameters were normal. 2. Right ventricular systolic function is mildly reduced. The right ventricular size is mildly enlarged. 3. Left atrial size was upper normal. 4. The mitral valve is grossly normal. Trivial mitral valve regurgitation. 5. The aortic valve is tricuspid. Aortic valve regurgitation is not visualized. No aortic stenosis is present. Aortic valve mean gradient measures  4.0 mmHg. 6. The inferior vena cava is  normal in size with greater than 50% respiratory variability, suggesting right atrial pressure of 3 mmHg.  Comparison(s): Prior images reviewed side by side. LVEF 60-65%. Mildly dilated RV with mildly reduced contraction.   Coronary calcium  score: 11/10/2023 Coronary calcium  score of 503. This was 44 th percentile for age and sex matched control.  Cardiac PET/CT 03/28/2024   The study shows no epicardial disease.  Presence of decreased flow reserve can be seen in microvascular disease, though motion artifact noted on flow reserved evaluation. The study is intermediate risk due to flow reserve.   LV perfusion is normal. There is no evidence of epicardial ischemia. There is no evidence of infarction.   Rest left ventricular function is normal. Stress left ventricular function is normal. Stress EF: 55%. End diastolic cavity size is normal. End systolic cavity size is normal.   Myocardial blood flow was computed to be 0.73ml/g/min at rest and 1.27ml/g/min at stress. Global myocardial blood flow reserve was 1.79 and was mildly abnormal.   Coronary calcium  was present on the attenuation correction CT images. Severe coronary calcifications were present. Coronary calcifications were present in the left anterior descending artery and left circumflex artery distribution(s). Aortic atherosclerosis.   Electronically Signed  By: Stanly Leavens M.D. Non-cardiac findings:  1. No significant non-cardiac findings. 2. Stable 3 mm subpleural right middle lobe nodule and 5 mm right minor fissural nodule have previous demonstrated long-term stability and require no further follow-up. 3. Hepatic steatosis.  Coronary angiography 04/16/2024: LM: Normal LAD: Minimal luminal irregularities Lcx: Minimal luminal irregularities RCA: Minimal luminal irregularities  LVEDP 18 mmHg  Coronary physiology testing:  Guide catheter: 6 Fr XB LAD 3.5 Normalization performed in  left main coronary artery. IC Nitroglycerin : 200 mcg Resting Pd/Pa: 0.97 FFR: 0.94 CFR: 1.6 IMR: 15  Conclusion: No significant epicardial coronary artery disease Reduced coronary flow reserve Coronary microvascular dysfunction  This is consistent with findings of PET/CT stress test. Continue risk factor modification. Although not formally tested during the study due to nonavailability of acetylcholine, no known historical evidence of coronary artery spasm. Therefore, recommend beta-blocker/calcium  channel blocker/Ranexa. Avoid nitrates.   RADIOLOGY: NA  Risk Assessment/Calculations:   NA   Labs:       Latest Ref Rng & Units 04/10/2024   10:48 AM 03/04/2023    4:01 AM 03/03/2023    3:45 AM  CBC  WBC 3.4 - 10.8 x10E3/uL 8.3  17.2  16.3   Hemoglobin 13.0 - 17.7 g/dL 82.8  86.0  85.2   Hematocrit 37.5 - 51.0 % 52.3  40.3  42.8   Platelets 150 - 450 x10E3/uL 282  193  205        Latest Ref Rng & Units 04/10/2024   10:48 AM 12/08/2023   11:23 AM 03/05/2023    4:00 AM  BMP  Glucose 70 - 99 mg/dL 92  848  818   BUN 8 - 27 mg/dL 10  9  10    Creatinine 0.76 - 1.27 mg/dL 9.13  9.19  9.07   BUN/Creat Ratio 10 - 24 12     Sodium 134 - 144 mmol/L 141  136  132   Potassium 3.5 - 5.2 mmol/L 4.8  3.9  3.2   Chloride 96 - 106 mmol/L 103  104  96   CO2 20 - 29 mmol/L 19  22  25    Calcium  8.6 - 10.2 mg/dL 9.7  9.0  8.7       Latest Ref Rng & Units 04/10/2024  10:48 AM 12/08/2023   11:23 AM 03/05/2023    4:00 AM  CMP  Glucose 70 - 99 mg/dL 92  848  818   BUN 8 - 27 mg/dL 10  9  10    Creatinine 0.76 - 1.27 mg/dL 9.13  9.19  9.07   Sodium 134 - 144 mmol/L 141  136  132   Potassium 3.5 - 5.2 mmol/L 4.8  3.9  3.2   Chloride 96 - 106 mmol/L 103  104  96   CO2 20 - 29 mmol/L 19  22  25    Calcium  8.6 - 10.2 mg/dL 9.7  9.0  8.7     Lab Results  Component Value Date   CHOL 87 (L) 11/26/2022   HDL 33 (L) 11/26/2022   LDLCALC 24 11/26/2022   TRIG 183 (H) 11/26/2022   CHOLHDL 2.6  11/26/2022   No results for input(s): LIPOA in the last 8760 hours. No components found for: NTPROBNP No results for input(s): PROBNP in the last 8760 hours. No results for input(s): TSH in the last 8760 hours.  External Labs: Collected: June 27, 2024 PCP. A1c 6.8 Total cholesterol 80, triglycerides 48, HDL 30, LDL calculated 37 Potassium 4.9. AST, ALT, alkaline phosphatase.within normal limits Hemoglobin 16.9 g/dL   Physical Exam:    Today's Vitals   07/11/24 1049  BP: 127/77  Pulse: 87  SpO2: 94%  Weight: 278 lb (126.1 kg)  Height: 5' 11 (1.803 m)   Body mass index is 38.77 kg/m. Wt Readings from Last 3 Encounters:  07/11/24 278 lb (126.1 kg)  05/09/24 275 lb 3.2 oz (124.8 kg)  04/16/24 270 lb (122.5 kg)    Physical Exam  Constitutional: No distress.  hemodynamically stable  Neck: No JVD present.  Cardiovascular: Normal rate, regular rhythm, S1 normal and S2 normal. Exam reveals no gallop, no S3 and no S4.  No murmur heard. Pulmonary/Chest: Effort normal and breath sounds normal. No stridor. He has no wheezes. He has no rales.  Musculoskeletal:        General: No edema.     Cervical back: Neck supple.  Skin: Skin is warm.     Impression & Recommendation(s):  Impression:   ICD-10-CM   1. Coronary atherosclerosis due to calcified coronary lesion  I25.10    I25.84     2. Chronic coronary microvascular dysfunction  I25.85     3. Type 2 diabetes mellitus with hyperglycemia, with long-term current use of insulin  (HCC)  E11.65    Z79.4     4. Hyperlipidemia associated with type 2 diabetes mellitus (HCC)  E11.69    E78.5     5. Myalgia due to statin  M79.10    T46.6X5A     6. Benign hypertension  I10     7. Family history of premature CAD  Z82.49         Recommendation(s):  Coronary atherosclerosis due to calcified coronary lesion Coronary microvascular dysfunction Total CAC 503, 79th percentile. Cardiac PET/CT July 2025: Normal  perfusion, decreased flow reserve suggestive of microvascular dysfunction. Left heart catheterization  03/2024: No significant epicardial coronary disease, coronary physiological testing suggestive of microvascular dysfunction.  Recommended beta-blockers/calcium  channel blocker/Ranexa in a stepwise fashion.   Symptoms have improved significantly after starting Toprol -XL 25 mg p.o. daily. Hold off on uptitration of medical therapy at this time. LDL currently at goal. A1c and triglyceride levels have improved as of October 2025.  Congratulated on his efforts  Type 2 diabetes mellitus with hyperglycemia,  with long-term current use of insulin  (HCC) Diagnosed with diabetes back in 2007. Currently on losartan, Repatha  Reemphasized importance of glycemic control.  Most recent hemoglobin A1c 6.8 as of October 2025 provided by the PCP. Improvement in A1c since July 2025.,  Congratulated on his efforts.  Hyperlipidemia associated with type 2 diabetes mellitus (HCC) Myalgia due to statin Currently on Repatha . Intolerant to statin therapy-rosuvastatin /pravastatin Most recent LDL 37 mg/dL as of October 2025  Benign hypertension Office blood pressures are well-controlled. Medications as discussed above Reemphasized importance of low-salt diet.  Family history of premature CAD Strong family history of premature coronary artery disease as noted above. Reemphasize importance of improving his modifiable cardiovascular risk factors.  Discussed management of at least 2 chronic comorbid conditions. Reviewed the results of the left heart catheterization with the patient in detail and mentioned above for further reference. Outside labs from October 1st 2025 independently reviewed as part of today's office visit Prescription drug management. Coordination of care.  Orders Placed:  No orders of the defined types were placed in this encounter.    Final Medication List:    No orders of the defined types  were placed in this encounter.   There are no discontinued medications.    Current Outpatient Medications:    5-Hydroxytryptophan (5-HTP PO), Take 100 mg by mouth at bedtime., Disp: , Rfl:    acetaminophen  (TYLENOL ) 500 MG tablet, Take 1,000 mg by mouth every 6 (six) hours as needed for moderate pain (pain score 4-6)., Disp: , Rfl:    AMBULATORY NON FORMULARY MEDICATION, Medication Name: Trimix  PGE 30 mcg Pap 30 mg Phent 1 mg, Disp: 5 mL, Rfl: 5   Ascorbic Acid (VITAMIN C) 1000 MG tablet, Take 1,000 mg by mouth in the morning., Disp: , Rfl:    aspirin  EC 81 MG tablet, Take 81 mg by mouth in the morning. Swallow whole., Disp: , Rfl:    carisoprodol  (SOMA ) 350 MG tablet, Take 350 mg by mouth 3 (three) times daily as needed for muscle spasms., Disp: , Rfl:    Cholecalciferol  (VITAMIN D3) 125 MCG (5000 UT) CAPS, Take 5,000 Units by mouth in the morning., Disp: , Rfl:    Coenzyme Q10 (CO Q-10) 200 MG CAPS, Take 200 mg by mouth daily., Disp: , Rfl:    Dulaglutide  (TRULICITY ) 3 MG/0.5ML SOPN, Inject 3 mg as directed once a week. **NEEDS TO BE SEEN BEFORE NEXT REFILL**, Disp: 2 mL, Rfl: 0   Evolocumab  (REPATHA  SURECLICK) 140 MG/ML SOAJ, Inject 140 mg into the skin every 14 (fourteen) days., Disp: 6 mL, Rfl: 1   fexofenadine (ALLEGRA) 180 MG tablet, Take 180 mg by mouth every morning. , Disp: , Rfl:    glipiZIDE  (GLUCOTROL ) 10 MG tablet, Take 10 mg by mouth daily before breakfast., Disp: , Rfl:    glucose blood (ONETOUCH ULTRA) test strip, check blood sugar once daily. DX:E11.69, Disp: 100 each, Rfl: 3   Insulin  Pen Needle (PEN NEEDLES) 31G X 6 MM MISC, 1 each by Does not apply route daily., Disp: 100 each, Rfl: 1   LANTUS  SOLOSTAR 100 UNIT/ML Solostar Pen, Inject 50-60 Units into the skin at bedtime., Disp: , Rfl:    losartan (COZAAR) 50 MG tablet, Take 50 mg by mouth daily., Disp: , Rfl:    MAGNESIUM  BISGLYCINATE PO, Take 72 mg by mouth in the morning., Disp: , Rfl:    metoprolol  succinate (TOPROL   XL) 25 MG 24 hr tablet, Take 1 tablet (25 mg total) by mouth  daily., Disp: 30 tablet, Rfl: 3   Multiple Vitamin (MULTIVITAMIN WITH MINERALS) TABS tablet, Take 1 tablet by mouth in the morning. Centrum, Disp: , Rfl:    nitroGLYCERIN  (NITROSTAT ) 0.4 MG SL tablet, Place 1 tablet (0.4 mg total) under the tongue every 5 (five) minutes as needed for chest pain., Disp: 30 tablet, Rfl: 0   Omega-3 Fatty Acids (OMEGA 3 PO), Take 400 mg by mouth daily., Disp: , Rfl:    pantoprazole  (PROTONIX ) 40 MG tablet, Take 1 tablet (40 mg total) by mouth 2 (two) times daily., Disp: 60 tablet, Rfl: 11   Probiotic Product (PROBIOTIC PO), Take 1 capsule by mouth in the morning., Disp: , Rfl:    sodium chloride  (OCEAN) 0.65 % SOLN nasal spray, Place 1 spray into both nostrils 2 (two) times daily., Disp: , Rfl:    SYRINGE-NEEDLE, DISP, 3 ML (LUER LOCK SAFETY SYRINGES) 22G X 1 3 ML MISC, For testosterone   injections, Disp: 30 each, Rfl: 1   testosterone  cypionate (DEPOTESTOSTERONE CYPIONATE) 200 MG/ML injection, Inject 200 mg into the muscle See admin instructions. Every 10 days, Disp: , Rfl:    vitamin E 180 MG (400 UNITS) capsule, Take 400 Units by mouth in the morning., Disp: , Rfl:    ZINC  PICOLINATE PO, Take 50 mg by mouth in the morning., Disp: , Rfl:    chlorpheniramine (CHLOR-TRIMETON) 4 MG tablet, Take 4 mg by mouth every evening., Disp: , Rfl:    ibuprofen  (ADVIL ) 200 MG tablet, Take 800 mg by mouth every 6 (six) hours as needed for moderate pain (pain score 4-6)., Disp: , Rfl:    silodosin  (RAPAFLO ) 8 MG CAPS capsule, Take 1 capsule (8 mg total) by mouth daily with breakfast., Disp: 30 capsule, Rfl: 3  Consent:   NA   Disposition:   12 months or sooner if needed  His questions and concerns were addressed to his satisfaction. He voices understanding of the recommendations provided during this encounter.    Signed, Madonna Michele HAS, [facility]  A Division of [facility] [address], [place], [place] [address]   07/11/2024 11:57 AM

## 2024-07-11 NOTE — Telephone Encounter (Signed)
 Return call to Pharmacy making them aware Rx sent, spoke to [facility]. Verbal from Dr. Sherrilee

## 2024-07-12 ENCOUNTER — Encounter (INDEPENDENT_AMBULATORY_CARE_PROVIDER_SITE_OTHER): Payer: Self-pay

## 2024-07-12 MED ORDER — SILODOSIN 8 MG PO CAPS: 8.0000 mg | ORAL_CAPSULE | Freq: Every day | ORAL | 3 refills | Status: AC

## 2024-07-15 DIAGNOSIS — G4730 Sleep apnea, unspecified: Secondary | ICD-10-CM | POA: Diagnosis not present

## 2024-07-17 DIAGNOSIS — G5603 Carpal tunnel syndrome, bilateral upper limbs: Secondary | ICD-10-CM | POA: Diagnosis not present

## 2024-07-17 DIAGNOSIS — G5602 Carpal tunnel syndrome, left upper limb: Secondary | ICD-10-CM | POA: Diagnosis not present

## 2024-07-17 DIAGNOSIS — M1812 Unilateral primary osteoarthritis of first carpometacarpal joint, left hand: Secondary | ICD-10-CM | POA: Diagnosis not present

## 2024-07-17 DIAGNOSIS — G5601 Carpal tunnel syndrome, right upper limb: Secondary | ICD-10-CM | POA: Diagnosis not present

## 2024-07-20 DIAGNOSIS — G5622 Lesion of ulnar nerve, left upper limb: Secondary | ICD-10-CM | POA: Diagnosis not present

## 2024-07-25 ENCOUNTER — Other Ambulatory Visit: Payer: Self-pay | Admitting: Medical Genetics

## 2024-07-25 DIAGNOSIS — Z006 Encounter for examination for normal comparison and control in clinical research program: Secondary | ICD-10-CM

## 2024-07-29 ENCOUNTER — Other Ambulatory Visit: Payer: Self-pay | Admitting: Cardiology

## 2024-08-02 DIAGNOSIS — G5622 Lesion of ulnar nerve, left upper limb: Secondary | ICD-10-CM | POA: Diagnosis not present

## 2024-08-06 DIAGNOSIS — M9903 Segmental and somatic dysfunction of lumbar region: Secondary | ICD-10-CM | POA: Diagnosis not present

## 2024-08-06 DIAGNOSIS — M9902 Segmental and somatic dysfunction of thoracic region: Secondary | ICD-10-CM | POA: Diagnosis not present

## 2024-08-06 DIAGNOSIS — M546 Pain in thoracic spine: Secondary | ICD-10-CM | POA: Diagnosis not present

## 2024-08-06 DIAGNOSIS — M9901 Segmental and somatic dysfunction of cervical region: Secondary | ICD-10-CM | POA: Diagnosis not present

## 2024-08-06 DIAGNOSIS — M5412 Radiculopathy, cervical region: Secondary | ICD-10-CM | POA: Diagnosis not present

## 2024-08-09 ENCOUNTER — Ambulatory Visit: Admitting: Urology

## 2024-08-14 DIAGNOSIS — G4733 Obstructive sleep apnea (adult) (pediatric): Secondary | ICD-10-CM | POA: Diagnosis not present

## 2024-08-20 DIAGNOSIS — M5412 Radiculopathy, cervical region: Secondary | ICD-10-CM | POA: Diagnosis not present

## 2024-08-20 DIAGNOSIS — M9902 Segmental and somatic dysfunction of thoracic region: Secondary | ICD-10-CM | POA: Diagnosis not present

## 2024-08-20 DIAGNOSIS — M9903 Segmental and somatic dysfunction of lumbar region: Secondary | ICD-10-CM | POA: Diagnosis not present

## 2024-08-20 DIAGNOSIS — M546 Pain in thoracic spine: Secondary | ICD-10-CM | POA: Diagnosis not present

## 2024-08-20 DIAGNOSIS — M9901 Segmental and somatic dysfunction of cervical region: Secondary | ICD-10-CM | POA: Diagnosis not present

## 2024-08-21 ENCOUNTER — Encounter (INDEPENDENT_AMBULATORY_CARE_PROVIDER_SITE_OTHER): Payer: Self-pay | Admitting: Otolaryngology

## 2024-08-21 ENCOUNTER — Ambulatory Visit (INDEPENDENT_AMBULATORY_CARE_PROVIDER_SITE_OTHER): Admitting: Otolaryngology

## 2024-08-21 VITALS — BP 112/77 | HR 86 | Ht 71.0 in | Wt 270.0 lb

## 2024-08-21 DIAGNOSIS — J3089 Other allergic rhinitis: Secondary | ICD-10-CM

## 2024-08-21 DIAGNOSIS — J342 Deviated nasal septum: Secondary | ICD-10-CM

## 2024-08-21 DIAGNOSIS — J0181 Other acute recurrent sinusitis: Secondary | ICD-10-CM

## 2024-08-21 DIAGNOSIS — R0981 Nasal congestion: Secondary | ICD-10-CM

## 2024-08-21 DIAGNOSIS — J343 Hypertrophy of nasal turbinates: Secondary | ICD-10-CM | POA: Diagnosis not present

## 2024-08-21 MED ORDER — AMOXICILLIN-POT CLAVULANATE 875-125 MG PO TABS: 1.0000 | ORAL_TABLET | Freq: Two times a day (BID) | ORAL | 0 refills | Status: AC

## 2024-08-21 MED ORDER — PREDNISONE 20 MG PO TABS: ORAL_TABLET | ORAL | 0 refills | Status: AC

## 2024-08-21 MED ORDER — PREDNISOLONE ACETATE 1 % OP SUSP: OPHTHALMIC | 2 refills | Status: AC

## 2024-08-21 NOTE — Patient Instructions (Addendum)
 Take Augmentin  875 mg by mouth (PO) twice daily for 7 days; take with food, take probiotic or yogurt with it Take prednisone  20mg  tablet for 4 days in morning, 10 mg for 3 days Use pred forte  6 drops in each neti pot and rinse 1/2 on each side daily I have ordered an imaging study for you to complete prior to your next visit. Please call [facility] at [phone] to schedule your imaging if you have not received a call within 24 hours. If you are unable to complete your imaging study prior to your next scheduled visit please call our office to let us  know. ---- schedule your CT in about 4-6 weeks

## 2024-08-21 NOTE — Progress Notes (Signed)
 Dear Dr. Shona, Here is my assessment for our mutual patient, Roger Guerra. Thank you for allowing me the opportunity to care for your patient. Please do not hesitate to contact me should you have any other questions. Sincerely, Dr. Eldora Blanch  Otolaryngology Clinic Note  HISTORY:  Initial visit (07/2024): Discussed the use of AI scribe software for clinical note transcription with the patient, who gave verbal consent to proceed.  History of Present Illness Roger Guerra is a 68 year old male with a history of sinus issues who presents with persistent nasal congestion and drainage.  Patient reports that his nasal symptoms started around April and persisted with only slight improvement about a month ago. Despite a course of antibiotics, he has ongoing congestion and mainly PND. He has occasional frontal pressure/headaches and mild hyposmia but no facial pressure or discolored drainage.   Of note, he has frequent sinus infections needing antibiotics anywhere between 2-6 times per year with sx including facial pressure and discolored drainage with improvement on abx.  No recent sinus CT or prior sinus surgeries  He uses Allegra in the morning, Zyrtec at night, daily Flonase, and  intermittent rinses with only partial relief, and he feels this is his worst episode.  He uses CPAP since 2000, which is difficult when his nose is blocked. A pulmonologist previously noted a deviated septum.  He has diabetes treated with Trulicity , insulin , and glipizide  and avoids steroids because of diabetes.SABRA  He is currently using flonase, PO anthistamine, and Neti Pot  GLP-1: no AP/AC: ASA 81  Tobacco: former, quit  PMHx: CAD, T2DM, HLD, HTN, OSA on CPAP, Hypopituitarism(?), h/o DVT, GERD, Back Pain  RADIOGRAPHIC EVALUATION AND INDEPENDENT REVIEW OF OTHER RECORDS:: CBC w/diff 11/26/2022: WBC 7.9, Eos 200; BMP 04/10/2024: BUN/Cr 10/0.86 Dr. Shona Referral notes reviewed and uploaded or available in  chart in media tab (07/04/2024): noted allergic rhinitis, given zyrtec; chronic sinusitis with intermittent ear pain and congestion for 5 months, recently treated with agumentin and rinses/flonase; Dx: Sinusitis; Rx: flonase, ref to ENT  Past Medical History:  Diagnosis Date   Anxiety disorder    Arthritis    Atherosclerosis    Back pain    Cervical radiculopathy    Constipation    Coronary artery calcification seen on CT scan    Depression    Diabetes (HCC)    Erectile dysfunction    GERD (gastroesophageal reflux disease)    Heartburn    History of blood clots    History of DVT (deep vein thrombosis)    History of DVT of lower extremity    Right leg 2002    History of pneumonia    Hyperlipidemia    Hypogonadism in male    Hypopituitarism    Joint pain    Myocardial infarction [facility])    Nerve root and plexus disorder    Obesity    Obstructive sleep apnea    OSA on CPAP    Osteoarthritis    Sciatica    Sleep apnea    SOBOE (shortness of breath on exertion)    Spinal stenosis    Spondylosis    Swelling of both lower extremities    Type 2 diabetes mellitus (HCC)    Vitamin D  deficiency    Past Surgical History:  Procedure Laterality Date   BLADDER SURGERY  2015   removal of skin tag near urethra   COLONOSCOPY N/A 12/10/2016   Procedure: COLONOSCOPY;  Surgeon: Lamar CHRISTELLA Hollingshead, MD;  [facility];  Service: Endoscopy;  Laterality: N/A;  8:30 AM   COLONOSCOPY WITH PROPOFOL  N/A 04/29/2022   Procedure: COLONOSCOPY WITH PROPOFOL ;  Surgeon: Shaaron Lamar HERO, MD;  Location: [facility];  Service: Endoscopy;  Laterality: N/A;  8:00am   CORONARY PRESSURE/FFR STUDY N/A 04/16/2024   Procedure: CORONARY PRESSURE/FFR STUDY;  Surgeon: Elmira Newman PARAS, MD;  [facility]: [facility];  Service: Cardiovascular;  Laterality: N/A;   CYSTOSCOPY WITH INSERTION OF UROLIFT N/A 12/12/2023   Procedure: CYSTOSCOPY WITH INSERTION OF UROLIFT;  Surgeon: Sherrilee Belvie CROME, MD;   Location: [facility];  Service: Urology;  Laterality: N/A;   ESOPHAGEAL DILATION N/A 12/29/2023   Procedure: DILATION, ESOPHAGUS;  Surgeon: Cindie Carlin POUR, DO;  Location: [facility];  Service: Endoscopy;  Laterality: N/A;   ESOPHAGOGASTRODUODENOSCOPY N/A 12/29/2023   Procedure: EGD (ESOPHAGOGASTRODUODENOSCOPY);  Surgeon: Cindie Carlin POUR, DO;  Location: [facility];  Service: Endoscopy;  Laterality: N/A;  11:15am, asa 2   KNEE ARTHROSCOPY WITH MEDIAL MENISECTOMY Right 08/25/2017   Procedure: Right knee arthroscopy, partial medial menisectomy and debridement;  Surgeon: Duwayne Purchase, MD;  Location: [facility];  Service: Orthopedics;  Laterality: Right;  60 mins   LEFT HEART CATH AND CORONARY ANGIOGRAPHY N/A 04/16/2024   Procedure: LEFT HEART CATH AND CORONARY ANGIOGRAPHY;  Surgeon: Elmira Newman PARAS, MD;  [facility];  Service: Cardiovascular;  Laterality: N/A;   POLYPECTOMY  12/10/2016   Procedure: POLYPECTOMY;  Surgeon: Lamar HERO Shaaron, MD;  [facility];  Service: Endoscopy;;  hepatic flexure   PTOSIS REPAIR Bilateral 02/2022   TONSILLECTOMY     TOTAL KNEE ARTHROPLASTY Left 03/02/2023   Procedure: TOTAL KNEE ARTHROPLASTY;  Surgeon: Duwayne Purchase, MD;  Location: [facility];  Service: Orthopedics;  Laterality: Left;   WISDOM TOOTH EXTRACTION     Family History  Problem Relation Age of Onset   Diabetes Mother    Lung cancer Father    Heart disease Father    Stroke Father    Cancer Father    Diabetes Sister    Diabetes Other        Siblings   Social History   Tobacco Use   Smoking status: Former    Current packs/day: 2.50    Average packs/day: 2.5 packs/day for 15.0 years (37.5 ttl pk-yrs)    Types: Cigarettes   Smokeless tobacco: Never  Substance Use Topics   Alcohol use: No    Alcohol/week: 0.0 standard drinks of alcohol   Allergies  Allergen Reactions   Empagliflozin      Yeast infection, issues with kidneys  Other Reaction(s): Not  available  empagliflozin    Invokana [Canagliflozin] Other (See Comments)    Yeast infection   Statins Other (See Comments)    Severe muscle cramps   Current Outpatient Medications  Medication Sig Dispense Refill   5-Hydroxytryptophan (5-HTP PO) Take 100 mg by mouth at bedtime.     acetaminophen  (TYLENOL ) 500 MG tablet Take 1,000 mg by mouth every 6 (six) hours as needed for moderate pain (pain score 4-6).     AMBULATORY NON FORMULARY MEDICATION Medication Name: Trimix  PGE 30 mcg Pap 30 mg Phent 1 mg 5 mL 5   Ascorbic Acid (VITAMIN C) 1000 MG tablet Take 1,000 mg by mouth in the morning.     aspirin  EC 81 MG tablet Take 81 mg by mouth in the morning. Swallow whole.     carisoprodol  (SOMA ) 350 MG tablet Take 350 mg by mouth 3 (  three) times daily as needed for muscle spasms.     chlorpheniramine (CHLOR-TRIMETON) 4 MG tablet Take 4 mg by mouth every evening.     Cholecalciferol  (VITAMIN D3) 125 MCG (5000 UT) CAPS Take 5,000 Units by mouth in the morning.     Coenzyme Q10 (CO Q-10) 200 MG CAPS Take 200 mg by mouth daily.     Dulaglutide  (TRULICITY ) 3 MG/0.5ML SOPN Inject 3 mg as directed once a week. **NEEDS TO BE SEEN BEFORE NEXT REFILL** 2 mL 0   Evolocumab  (REPATHA  SURECLICK) 140 MG/ML SOAJ Inject 140 mg into the skin every 14 (fourteen) days. 6 mL 1   fexofenadine (ALLEGRA) 180 MG tablet Take 180 mg by mouth every morning.      glipiZIDE  (GLUCOTROL ) 10 MG tablet Take 10 mg by mouth daily before breakfast.     glucose blood (ONETOUCH ULTRA) test strip check blood sugar once daily. DX:E11.69 100 each 3   Insulin  Pen Needle (PEN NEEDLES) 31G X 6 MM MISC 1 each by Does not apply route daily. 100 each 1   LANTUS  SOLOSTAR 100 UNIT/ML Solostar Pen Inject 50-60 Units into the skin at bedtime.     losartan (COZAAR) 50 MG tablet Take 50 mg by mouth daily.     MAGNESIUM  BISGLYCINATE PO Take 72 mg by mouth in the morning.     metoprolol  succinate (TOPROL -XL) 25 MG 24 hr tablet Take 1 tablet (25  mg total) by mouth daily. 90 tablet 3   Multiple Vitamin (MULTIVITAMIN WITH MINERALS) TABS tablet Take 1 tablet by mouth in the morning. Centrum     nitroGLYCERIN  (NITROSTAT ) 0.4 MG SL tablet Place 1 tablet (0.4 mg total) under the tongue every 5 (five) minutes as needed for chest pain. 30 tablet 0   Omega-3 Fatty Acids (OMEGA 3 PO) Take 400 mg by mouth daily.     pantoprazole  (PROTONIX ) 40 MG tablet Take 1 tablet (40 mg total) by mouth 2 (two) times daily. 60 tablet 11   prednisoLONE  acetate (PRED FORTE ) 1 % ophthalmic suspension Put 6 drops of the steroid in the neti pot and rinse half on each side. 15 mL 2   Probiotic Product (PROBIOTIC PO) Take 1 capsule by mouth in the morning.     silodosin  (RAPAFLO ) 8 MG CAPS capsule Take 1 capsule (8 mg total) by mouth daily with breakfast. 30 capsule 3   silodosin  (RAPAFLO ) 8 MG CAPS capsule Take 1 capsule (8 mg total) by mouth daily with breakfast. 30 capsule 3   sodium chloride  (OCEAN) 0.65 % SOLN nasal spray Place 1 spray into both nostrils 2 (two) times daily.     SYRINGE-NEEDLE, DISP, 3 ML (LUER LOCK SAFETY SYRINGES) 22G X 1 3 ML MISC For testosterone   injections 30 each 1   testosterone  cypionate (DEPOTESTOSTERONE CYPIONATE) 200 MG/ML injection Inject 200 mg into the muscle See admin instructions. Every 10 days     vitamin E 180 MG (400 UNITS) capsule Take 400 Units by mouth in the morning.     ZINC  PICOLINATE PO Take 50 mg by mouth in the morning.     No current facility-administered medications for this visit.   BP 112/77 (BP Location: Left Arm, Patient Position: Sitting, Cuff Size: Large)   Pulse 86   Ht 5' 11 (1.803 m)   Wt 270 lb (122.5 kg)   SpO2 93%   BMI 37.66 kg/m   PHYSICAL EXAM:  BP 112/77 (BP Location: Left Arm, Patient Position: Sitting, Cuff Size: Large)  Pulse 86   Ht 5' 11 (1.803 m)   Wt 270 lb (122.5 kg)   SpO2 93%   BMI 37.66 kg/m    Salient findings:  CN II-XII intact Bilateral EAC clear and TM intact with  well pneumatized middle ear spaces Nose: Anterior rhinoscopy reveals septum modest deviation, bilateral inferior turbinate hypertrophy.  Nasal endoscopy was indicated to better evaluate the nose and paranasal sinuses, given the patient's history and exam findings, and is detailed below. No lesions of oral cavity/oropharynx No obviously palpable neck masses/lymphadenopathy/thyromegaly No respiratory distress or stridor   PROCEDURE:  Prior to initiating any procedures, risks/benefits/alternatives were explained to the patient and verbal consent obtained. Diagnostic Nasal Endoscopy Pre-procedure diagnosis: Concern for recurrent acute sinusitis Post-procedure diagnosis: same Indication: See pre-procedure diagnosis and physical exam above Complications: None apparent EBL: 0 mL Anesthesia: Lidocaine  4% and topical decongestant was topically sprayed in each nasal cavity  Description of Procedure:  Patient was identified. A rigid 30 degree endoscope was utilized to evaluate the sinonasal cavities, mucosa, sinus ostia and turbinates and septum.  Overall, signs of mucosal inflammation are noted.  No mucopurulence, polyps, or masses noted.   Right Middle meatus: mild mucosal edema Right SE Recess: clear Left MM: mild mucosal edema Left SE Recess: clear Right septal deviation, right concha bullosa?   CPT CODE -- 31231 - Mod 25   ASSESSMENT:  68 y.o. with:  1. Other acute recurrent sinusitis   2. Nasal septal deviation   3. Hypertrophy of both inferior nasal turbinates   4. Nasal congestion   5. Other allergic rhinitis    Endo overall without purulence, but main complaint is drainage and congestion which persists despite medical management. Given continued symptoms, makes sense to treat maximally medically and then obtain post-treatment CT  Recurrent acute sinusitis with nasal congestion and drainage -- Chronic nasal congestion and drainage with intermittent improvement. Symptoms include  sinus drainage, some frontal pressure, and decreased sense of smell. Differential includes sinusitis and allergic rhinitis. - Prescribed Augmentin  for one week. - Prescribed steroids: 20 mg for four days, then 10 mg for three days --- monitor blood sugars - Ordered CT scan of the sinuses in four to six weeks. - Advised continued use of Allegra, Zyrtec, and Flonase. - Will do short term pred-forte rinses to see if they help  Deviated nasal septum and hypertrophy of nasal turbinates Deviated septum and hypertrophy of nasal turbinates causing obstruction. Can discuss septo/turbs at next visit if sx persist with clarifix.  Possible allergic rhinitis Chronic nasal symptoms suggestive of allergic rhinitis.  - Continue Allegra, Zyrtec, and Flonase.  F/u ~6 weeks  See below regarding exact medications prescribed this encounter including dosages and route: Meds ordered this encounter  Medications   amoxicillin -clavulanate (AUGMENTIN ) 875-125 MG tablet    Sig: Take 1 tablet by mouth 2 (two) times daily for 7 days.    Dispense:  14 tablet    Refill:  0   predniSONE  (DELTASONE ) 20 MG tablet    Sig: Take 1 tablet (20 mg total) by mouth daily with breakfast for 4 days, THEN 0.5 tablets (10 mg total) daily with breakfast for 3 days.    Dispense:  5.5 tablet    Refill:  0   prednisoLONE  acetate (PRED FORTE ) 1 % ophthalmic suspension    Sig: Put 6 drops of the steroid in the neti pot and rinse half on each side.    Dispense:  15 mL    Refill:  2  Thank you for allowing me the opportunity to care for your patient. Please do not hesitate to contact me should you have any other questions.  Sincerely, Eldora Blanch, MD Otolaryngologist (ENT), [facility] Phone: [phone] Fax: [phone]  MDM:  Level 4: [phone] Complex4ity/Problems addressed: mod - chronic problems Data complexity: mod - independent review of note, labs, order test - Morbidity: mod  - Prescription Drug  prescribed or managed: y  09/02/2024, 9:29 AM

## 2024-10-03 ENCOUNTER — Ambulatory Visit (HOSPITAL_BASED_OUTPATIENT_CLINIC_OR_DEPARTMENT_OTHER): Admitting: Nurse Practitioner

## 2024-10-03 ENCOUNTER — Encounter (HOSPITAL_BASED_OUTPATIENT_CLINIC_OR_DEPARTMENT_OTHER): Payer: Self-pay | Admitting: Nurse Practitioner

## 2024-10-03 VITALS — BP 134/76 | HR 83 | Ht 71.0 in | Wt 277.3 lb

## 2024-10-03 DIAGNOSIS — I2585 Chronic coronary microvascular dysfunction: Secondary | ICD-10-CM

## 2024-10-03 DIAGNOSIS — Z6838 Body mass index (BMI) 38.0-38.9, adult: Secondary | ICD-10-CM | POA: Diagnosis not present

## 2024-10-03 DIAGNOSIS — E785 Hyperlipidemia, unspecified: Secondary | ICD-10-CM

## 2024-10-03 DIAGNOSIS — T466X5D Adverse effect of antihyperlipidemic and antiarteriosclerotic drugs, subsequent encounter: Secondary | ICD-10-CM

## 2024-10-03 DIAGNOSIS — I2510 Atherosclerotic heart disease of native coronary artery without angina pectoris: Secondary | ICD-10-CM | POA: Diagnosis not present

## 2024-10-03 DIAGNOSIS — E66812 Obesity, class 2: Secondary | ICD-10-CM | POA: Diagnosis not present

## 2024-10-03 DIAGNOSIS — Z794 Long term (current) use of insulin: Secondary | ICD-10-CM | POA: Diagnosis not present

## 2024-10-03 DIAGNOSIS — E1165 Type 2 diabetes mellitus with hyperglycemia: Secondary | ICD-10-CM | POA: Diagnosis not present

## 2024-10-03 DIAGNOSIS — M791 Myalgia, unspecified site: Secondary | ICD-10-CM | POA: Diagnosis not present

## 2024-10-03 DIAGNOSIS — I10 Essential (primary) hypertension: Secondary | ICD-10-CM

## 2024-10-03 NOTE — Patient Instructions (Signed)
 Medication Instructions:   Your physician recommends that you continue on your current medications as directed. Please refer to the Current Medication list given to you today.   *If you need a refill on your cardiac medications before your next appointment, please call your pharmacy*  Lab Work:  None ordered.  If you have labs (blood work) drawn today and your tests are completely normal, you will receive your results only by: MyChart Message (if you have MyChart) OR A paper copy in the mail If you have any lab test that is abnormal or we need to change your treatment, we will call you to review the results.  Testing/Procedures:  None ordered.  Follow-Up: At [facility], you and your health needs are our priority.  As part of our continuing mission to provide you with exceptional heart care, our providers are all part of one team.  This team includes your primary Cardiologist (physician) and Advanced Practice Providers or APPs (Physician Assistants and Nurse Practitioners) who all work together to provide you with the care you need, when you need it.  Your next appointment:   9 month(s)  Provider:   Madonna Large, DO    We recommend signing up for the patient portal called MyChart.  Sign up information is provided on this After Visit Summary.  MyChart is used to connect with patients for Virtual Visits (Telemedicine).  Patients are able to view lab/test results, encounter notes, upcoming appointments, etc.  Non-urgent messages can be sent to your provider as well.   To learn more about what you can do with MyChart, go to [email or web address].   Other Instructions  Your physician wants you to follow-up in: 9 months.  You will receive a reminder letter in the mail two months in advance. If you don't receive a letter, please call our office to schedule the follow-up appointment.

## 2024-10-03 NOTE — Progress Notes (Signed)
 " Cardiology Office Note:  .   Date:  10/03/2024  ID:  Roger Guerra, DOB [birth year], MRN [id number] PCP: Shona Norleen PEDLAR, MD  [facility] Providers Cardiologist:  Madonna Large, DO    Patient Profile: .      PMH Dyslipidemia Coronary artery disease CT calcium  score 11/10/23 CAC score 503 (79th percentile) LM 0, LAD 458, LCx 33, RCA 12.1 LHC  Type 2 diabetes mellitus Nuclear stress test 12/2020 Low risk study, normal EF OSA Hypopituitarism DVT Statin intolerance Low vitamin d  level  Referred to Advanced Lipid disorder clinic and seen by Dr. Mona 03/16/21.  He reported intolerance to statins namely rosuvastatin  and pravastatin which caused profound myalgias and he developed thrombosis after starting 1 statin which he thought might be related.  His most recent lipid profile at that time revealed total cholesterol 215, triglycerides 111, HDL 38, and LDL 157.  He was hesitant to take another statin. Dr. Mona reviewed cardiovascular reduction risk benefit of statin therapy. He had been referred to weight loss clinic.  He was felt to have metabolic dyslipidemia with low HDL and elevated LDL he was felt to have metabolic dyslipidemia with low HDL and elevated LDL fitting the metabolic pattern of diabetes and hypopituitarism. He was agreeable to start ezetimibe  10 mg daily and continue to work on weight loss and dietary changes.  Lipids improved but were not at goal with ezetimibe  alone.    Attempted to get insurance coverage for Nexletol  which was denied.  He did start Praluent  75 mg every 2 weeks and continued ezetimibe .  At follow-up visit 04/20/2022 he reported joint pain, this is different from pain associated with statin therapy.  LDL-P now 720 down from 1246, LDL-C 48 down from 97, and triglycerides 68 down from 259.  He was advised to take an ezetimibe  holiday to see if this improved joint pain as it was felt Praluent  was less likely to cause the symptoms.  Due to change in insurance, we  received a notification 09/2022 that Repatha  is now preferred and patient was started on Repatha  140 mg every 2 weeks.  Seen by me me on 08/31/23 at which time he reported feeling fatigued, especially in the mornings, and occasional chest pain that does not worsen with exertion. Reports having orthostatic hypotension, which was particularly problematic during a recent knee replacement surgery. Maintains an active lifestyle, walking three times a day and doing mat exercises. Follows a low-carb, high-fat diet, which he finds helps with weight management and blood sugar control. Admits to struggling with discipline at times. Has significant family history of heart disease, with his father having had a quintuple bypass at 59 and his brother having had a significant heart attack three years ago. Prior to his father, none of the males on that side of the family had lived past age 23. He denied shortness of breath, orthopnea, PND, edema, presyncope, syncope.  Seen in follow-up 8 on 01/02/2024 reporting multiple concerns over the past few months including the death of his mother. Recent infection following urolift implant requiring antibiotics.  Reported constant fatigue, attributed to  combination of obesity, diabetes, and possible heart disease. Drinking 2 pots of coffee daily, a habit he has maintained for fifty years. We ordered calcium  scoring for risk stratification and it revealed scan revealed CAC of 504, which concerns him. No chest pain or shortness of breath during exercise, but does experience occasional chest pain in the evenings.  No orthopnea, PND, presyncope, or syncope. Works out  6 days per week which consists of 10-minute walk, light dumbbells, yoga/mat routine for 25 minutes and walking again for 10 minutes.  He walks up to inclines and has to rest for a bit at the top of the hill. Has struggled with weight gain and diet for many years. Admits to 30 lb weight gain since August or September and has found  it difficult to stick to a diet. Currently on Trulicity  for diabetes and has previously tried Ozempic  which caused constipation and then diarrhea. He had piercing pain in his right ear and then blood coming out two nights ago.   Cardiac PET CT ordered for ischemia evaluation and revealed decreased flow reserve seen and microvascular disease with mildly abnormal global myocardial blood flow, stress EF 55%, no evidence of ischemia or infarction.  He was seen by Dr. Michele 04/10/2024 for management of coronary atherosclerosis.  He underwent left heart catheterization on 04/16/2024 which revealed no significant epicardial coronary artery disease, reduced coronary flow reserve, coronary microvascular dysfunction.  Echocardiogram showed preserved LVEF, RV size mildly enlarged and function mildly reduced, no significant valve disease.  Seen in follow-up by Dr. Michele on 07/11/2024 with improvement of symptoms after initiation of Toprol -XL.  No further titration of antianginal medication at that time.  1 year follow-up was recommended.       History of Present Illness: .    History of Present Illness Roger Guerra is a very pleasant 69 year old male who presents for follow-up of hyperlipidemia. He underwent cardiac catheterization that showed minimal luminal irregularities. Since starting metoprolol  he has had no chest pain and generally is feeling well. He has persistent left arm weakness and morning hand numbness, treated with splints. He denies dyspnea, PND, orthopnea, edema, presyncope, syncope, palpitations. He notes leg swelling, more in right than left since prior DVT. Was previously told he had a heart attack in 2018 but was later informed this was not the case. His LDL has decreased from 157 to 37 with current cholesterol therapy. He had significant leg cramps with prior statin use, which improved after discontinuing statins and starting Repatha .  He has recently decreased caffeine consumption considerably  and wonders if caffeine contributed to previous leg cramps.  He was considering going back on statin therapy given that Repatha  is somewhat expensive.  He is on Trulicity  for blood sugar control. He has resumed gym activity but has difficulty increasing his heart rate above 112, which he attributes to metoprolol .   Discussed the use of AI scribe software for clinical note transcription with the patient, who gave verbal consent to proceed.   ROS: See HPI       Studies Reviewed: .        Risk Assessment/Calculations:             Physical Exam:   VS:  BP 134/76   Pulse 83   Ht 5' 11 (1.803 m)   Wt 277 lb 4.8 oz (125.8 kg)   SpO2 96%   BMI 38.68 kg/m    Wt Readings from Last 3 Encounters:  10/03/24 277 lb 4.8 oz (125.8 kg)  08/21/24 270 lb (122.5 kg)  07/11/24 278 lb (126.1 kg)    GEN: Well nourished, well developed in no acute distress NECK: No JVD; No carotid bruits CARDIAC: RRR, no murmurs, rubs, gallops RESPIRATORY:  Clear to auscultation. Expiratory wheezing noted on occasion. No rhonchi  ABDOMEN: Soft, non-tender, non-distended EXTREMITIES:  No edema; No deformity     ASSESSMENT AND  PLAN: .    CAD Coronary CT 11/10/2023 revealed CAC score of 503 (79th percentile) with the bulk of calcium  being in LAD.  He was having left-sided chest pain and fatigue. Cardiac PET/CT revealed decreased flow reserve and microvascular disease with mildly abnormal global myocardial blood flow, stress EF 55%, no evidence of ischemia or infarction. Seen in consult by Dr. Michele 04/10/2024 to discuss medical therapy versus catheterization. He underwent left heart catheterization on 04/16/2024 which revealed no significant epicardial coronary artery disease, reduced coronary flow reserve, coronary microvascular dysfunction. Echocardiogram showed preserved LVEF, RV size mildly enlarged and function mildly reduced, no significant valve disease.  Today, he denies chest pain, dyspnea, or other symptoms  concerning for angina. He is active with regular exercise. No indication for further ischemic evaluation at this time.  No bleeding concerns. - Continue GDMT including metoprolol , losartan, Repatha , aspirin  - Heart healthy diet avoiding processed foods, saturated fat, sugar, and other simple carbohydrates encouraged - Be as physically active as possible every day and aim for at least 150 minutes of moderate intensity exercise each week  Dyslipidemia LDL goal < 55 Statin myalgia Lipid panel completed 06/27/24 with triglycerides 48, HDL 30, LDL-C 37, total cholesterol 80.        No concerning side effects from Repatha . He was previously also on ezetimibe  but wanted to discontinue to decrease pill burden. Lipids remain well-controlled on Repatha  alone. Lengthy discussion about cost of Repatha  and consideration of switching back to statin to see if he can tolerate. Through shared decision making, he would like to continue Repatha .  Advised him to contact us  if cost becomes too burdensome. - Continue Repatha  every 14 days.  Obesity Diabetes A1C 6.8% on 07/03/2024, improved from 8.0% on 12/08/23. Encouraged him to discuss newest GLP1 agonist, tirzepatide with PCP for potentially better appetite and A1C control.  -Continue heart healthy diet and regular exercise. - Management per PCP  Hypertension BP is well controlled. Metoprolol  was added for GDMT for CAD.  Renal function stable on labs completed 07/03/2024. - Continue losartan, metoprolol         Disposition: 10 months with Dr. Michele  Signed, Rosaline Bane, NP-C "

## 2024-10-04 ENCOUNTER — Ambulatory Visit (HOSPITAL_COMMUNITY)
Admission: RE | Admit: 2024-10-04 | Discharge: 2024-10-04 | Disposition: A | Discharge status: Home / Self Care | Source: Ambulatory Visit | Attending: Otolaryngology | Admitting: Otolaryngology

## 2024-10-04 DIAGNOSIS — J343 Hypertrophy of nasal turbinates: Secondary | ICD-10-CM | POA: Insufficient documentation

## 2024-10-04 DIAGNOSIS — R0981 Nasal congestion: Secondary | ICD-10-CM | POA: Diagnosis present

## 2024-10-04 DIAGNOSIS — J0181 Other acute recurrent sinusitis: Secondary | ICD-10-CM | POA: Insufficient documentation

## 2024-10-04 DIAGNOSIS — J342 Deviated nasal septum: Secondary | ICD-10-CM | POA: Insufficient documentation

## 2024-10-05 LAB — LAB REPORT - SCANNED
A1c: 6.7
Albumin, Urine POC: 14.3
Creatinine, POC: 33 mg/dL
EGFR: 96
Microalb Creat Ratio: 43
PSA, Total: 2.6

## 2024-10-10 ENCOUNTER — Ambulatory Visit (HOSPITAL_BASED_OUTPATIENT_CLINIC_OR_DEPARTMENT_OTHER): Payer: Self-pay | Admitting: Nurse Practitioner

## 2024-10-11 ENCOUNTER — Ambulatory Visit (INDEPENDENT_AMBULATORY_CARE_PROVIDER_SITE_OTHER): Admitting: Otolaryngology

## 2024-10-16 ENCOUNTER — Encounter (INDEPENDENT_AMBULATORY_CARE_PROVIDER_SITE_OTHER): Payer: Self-pay | Admitting: Otolaryngology

## 2024-10-16 ENCOUNTER — Ambulatory Visit (INDEPENDENT_AMBULATORY_CARE_PROVIDER_SITE_OTHER): Admitting: Otolaryngology

## 2024-10-16 DIAGNOSIS — J342 Deviated nasal septum: Secondary | ICD-10-CM | POA: Diagnosis not present

## 2024-10-16 DIAGNOSIS — R0981 Nasal congestion: Secondary | ICD-10-CM

## 2024-10-16 DIAGNOSIS — J0181 Other acute recurrent sinusitis: Secondary | ICD-10-CM | POA: Diagnosis not present

## 2024-10-16 DIAGNOSIS — J3089 Other allergic rhinitis: Secondary | ICD-10-CM

## 2024-10-16 DIAGNOSIS — J343 Hypertrophy of nasal turbinates: Secondary | ICD-10-CM | POA: Diagnosis not present

## 2024-10-16 NOTE — Progress Notes (Signed)
 Dear Dr. Shona, Here is my assessment for our mutual patient, Roger Guerra. Thank you for allowing me the opportunity to care for your patient. Please do not hesitate to contact me should you have any other questions. Sincerely, Dr. Eldora Blanch  Otolaryngology Clinic Note  HISTORY:  Initial visit (07/2024): Discussed the use of AI scribe software for clinical note transcription with the patient, who gave verbal consent to proceed.  History of Present Illness Roger Guerra is a 69 year old male with a history of sinus issues who presents with persistent nasal congestion and drainage.  Patient reports that his nasal symptoms started around April and persisted with only slight improvement about a month ago. Despite a course of antibiotics, he has ongoing congestion and mainly PND. He has occasional frontal pressure/headaches and mild hyposmia but no facial pressure or discolored drainage.   Of note, he has frequent sinus infections needing antibiotics anywhere between 2-6 times per year with sx including facial pressure and discolored drainage with improvement on abx.  No recent sinus CT or prior sinus surgeries  He uses Allegra in the morning, Zyrtec at night, daily Flonase, and  intermittent rinses with only partial relief, and he feels this is his worst episode.  He uses CPAP since 2000, which is difficult when his nose is blocked. A pulmonologist previously noted a deviated septum.  He has diabetes treated with Trulicity , insulin , and glipizide  and avoids steroids because of diabetes.SABRA  He is currently using flonase, PO anthistamine, and Neti Pot  --------------------------------------------------------- 10/16/2024 The patient gave consent to have this visit done by telemedicine / virtual visit, two identifiers were used to identify patient. This is also consent for access the chart and treat the patient via this visit. The patient is located in [place] .  I, the provider, am  at the office.  We spent 12 minutes together for the visit.  Joined by telephone.  Feeling better with resolution of pressure/headaches. Congestion is better but now returning. He is using saline rinses with pred forte , flonase and allegra (AM) and zyrtec. We discussed his CT. He continues to report that he has 4 sinus infections per year on average, which has been ongoing for several years.   Sinus Infections reported: 09/2023, 05/14/2024, 07/04/2024, also treated 07/2024  GLP-1: no AP/AC: ASA 81  Tobacco: former, quit  PMHx: CAD, T2DM, HLD, HTN, OSA on CPAP, Hypopituitarism(?), h/o DVT, GERD, Back Pain  RADIOGRAPHIC EVALUATION AND INDEPENDENT REVIEW OF OTHER RECORDS:: CBC w/diff 11/26/2022: WBC 7.9, Eos 200; BMP 04/10/2024: BUN/Cr 10/0.86 Dr. Shona Referral notes reviewed and uploaded or available in chart in media tab (07/04/2024): noted allergic rhinitis, given zyrtec; chronic sinusitis with intermittent ear pain and congestion for 5 months, recently treated with agumentin and rinses/flonase; Dx: Sinusitis; Rx: flonase, ref to ENT  Sinus Infections reported: 09/2023, 05/14/2024, 07/04/2024. Also treated 07/2024  CT Sinus 10/04/2024 independently interpreted: clear significant left septal deviation, paranasal sinuses otherwise without significant disease, left haller cell  Past Medical History:  Diagnosis Date   Anxiety disorder    Arthritis    Atherosclerosis    Back pain    Cervical radiculopathy    Constipation    Coronary artery calcification seen on CT scan    Depression    Diabetes (HCC)    Erectile dysfunction    GERD (gastroesophageal reflux disease)    Heartburn    History of blood clots    History of DVT (deep vein thrombosis)    History of DVT  of lower extremity    Right leg 2002    History of pneumonia    Hyperlipidemia    Hypogonadism in male    Hypopituitarism    Joint pain    Myocardial infarction [facility])    Nerve root and plexus disorder    Obesity    Obstructive  sleep apnea    OSA on CPAP    Osteoarthritis    Sciatica    Sleep apnea    SOBOE (shortness of breath on exertion)    Spinal stenosis    Spondylosis    Swelling of both lower extremities    Type 2 diabetes mellitus (HCC)    Vitamin D  deficiency    Past Surgical History:  Procedure Laterality Date   BLADDER SURGERY  2015   removal of skin tag near urethra   COLONOSCOPY N/A 12/10/2016   Procedure: COLONOSCOPY;  Surgeon: Lamar CHRISTELLA Hollingshead, MD;  Location: [facility];  Service: Endoscopy;  Laterality: N/A;  8:30 AM   COLONOSCOPY WITH PROPOFOL  N/A 04/29/2022   Procedure: COLONOSCOPY WITH PROPOFOL ;  Surgeon: Hollingshead Lamar CHRISTELLA, MD;  Location: [facility];  Service: Endoscopy;  Laterality: N/A;  8:00am   CORONARY PRESSURE/FFR STUDY N/A 04/16/2024   Procedure: CORONARY PRESSURE/FFR STUDY;  Surgeon: Elmira Newman PARAS, MD;  Location: [facility];  Service: Cardiovascular;  Laterality: N/A;   CYSTOSCOPY WITH INSERTION OF UROLIFT N/A 12/12/2023   Procedure: CYSTOSCOPY WITH INSERTION OF UROLIFT;  Surgeon: Sherrilee Belvie CROME, MD;  Location: [facility];  Service: Urology;  Laterality: N/A;   ESOPHAGEAL DILATION N/A 12/29/2023   Procedure: DILATION, ESOPHAGUS;  Surgeon: Cindie Carlin POUR, DO;  Location: [facility];  Service: Endoscopy;  Laterality: N/A;   ESOPHAGOGASTRODUODENOSCOPY N/A 12/29/2023   Procedure: EGD (ESOPHAGOGASTRODUODENOSCOPY);  Surgeon: Cindie Carlin POUR, DO;  Location: [facility];  Service: Endoscopy;  Laterality: N/A;  11:15am, asa 2   KNEE ARTHROSCOPY WITH MEDIAL MENISECTOMY Right 08/25/2017   Procedure: Right knee arthroscopy, partial medial menisectomy and debridement;  Surgeon: Duwayne Purchase, MD;  Location: [facility];  Service: Orthopedics;  Laterality: Right;  60 mins   LEFT HEART CATH AND CORONARY ANGIOGRAPHY N/A 04/16/2024   Procedure: LEFT HEART CATH AND CORONARY ANGIOGRAPHY;  Surgeon: Elmira Newman PARAS, MD;  Location: [facility];  Service: Cardiovascular;   Laterality: N/A;   POLYPECTOMY  12/10/2016   Procedure: POLYPECTOMY;  Surgeon: Lamar CHRISTELLA Hollingshead, MD;  Location: [facility];  Service: Endoscopy;;  hepatic flexure   PTOSIS REPAIR Bilateral 02/2022   TONSILLECTOMY     TOTAL KNEE ARTHROPLASTY Left 03/02/2023   Procedure: TOTAL KNEE ARTHROPLASTY;  Surgeon: Duwayne Purchase, MD;  Location: [facility];  Service: Orthopedics;  Laterality: Left;   WISDOM TOOTH EXTRACTION     Family History  Problem Relation Age of Onset   Diabetes Mother    Lung cancer Father    Heart disease Father    Stroke Father    Cancer Father    Diabetes Sister    Diabetes Other        Siblings   Social History   Tobacco Use   Smoking status: Former    Current packs/day: 2.50    Average packs/day: 2.5 packs/day for 15.0 years (37.5 ttl pk-yrs)    Types: Cigarettes   Smokeless tobacco: Never  Substance Use Topics   Alcohol use: No    Alcohol/week: 0.0 standard drinks of alcohol   Allergies  Allergen Reactions   Empagliflozin   Yeast infection, issues with kidneys  Other Reaction(s): Not available  empagliflozin    Invokana [Canagliflozin] Other (See Comments)    Yeast infection   Statins Other (See Comments)    Severe muscle cramps   Current Outpatient Medications  Medication Sig Dispense Refill   5-Hydroxytryptophan (5-HTP PO) Take 100 mg by mouth at bedtime.     acetaminophen  (TYLENOL ) 500 MG tablet Take 1,000 mg by mouth every 6 (six) hours as needed for moderate pain (pain score 4-6).     AMBULATORY NON FORMULARY MEDICATION Medication Name: Trimix  PGE 30 mcg Pap 30 mg Phent 1 mg 5 mL 5   Ascorbic Acid (VITAMIN C) 1000 MG tablet Take 1,000 mg by mouth in the morning.     aspirin  EC 81 MG tablet Take 81 mg by mouth in the morning. Swallow whole.     carisoprodol  (SOMA ) 350 MG tablet Take 350 mg by mouth 3 (three) times daily as needed for muscle spasms.     chlorpheniramine (CHLOR-TRIMETON) 4 MG tablet Take 4 mg by mouth every evening.      Cholecalciferol  (VITAMIN D3) 125 MCG (5000 UT) CAPS Take 5,000 Units by mouth in the morning.     Coenzyme Q10 (CO Q-10) 200 MG CAPS Take 200 mg by mouth daily.     Continuous Glucose Sensor (FREESTYLE LIBRE 3 PLUS SENSOR) MISC USE AS DIRECTED. CHANGE EVERY 15 DAYS.     Dulaglutide  (TRULICITY ) 3 MG/0.5ML SOPN Inject 3 mg as directed once a week. **NEEDS TO BE SEEN BEFORE NEXT REFILL** 2 mL 0   Evolocumab  (REPATHA  SURECLICK) 140 MG/ML SOAJ Inject 140 mg into the skin every 14 (fourteen) days. 6 mL 1   fexofenadine (ALLEGRA) 180 MG tablet Take 180 mg by mouth every morning.      fluticasone (FLONASE) 50 MCG/ACT nasal spray Place into both nostrils.     glipiZIDE  (GLUCOTROL ) 10 MG tablet Take 10 mg by mouth daily before breakfast.     glucose blood (ONETOUCH ULTRA) test strip check blood sugar once daily. DX:E11.69 100 each 3   Insulin  Pen Needle (PEN NEEDLES) 31G X 6 MM MISC 1 each by Does not apply route daily. 100 each 1   LANTUS  SOLOSTAR 100 UNIT/ML Solostar Pen Inject 50-60 Units into the skin at bedtime.     losartan (COZAAR) 50 MG tablet Take 50 mg by mouth daily.     MAGNESIUM  BISGLYCINATE PO Take 72 mg by mouth in the morning.     meloxicam (MOBIC) 7.5 MG tablet Take 7.5 mg by mouth 2 (two) times daily as needed.     metoprolol  succinate (TOPROL -XL) 25 MG 24 hr tablet Take 1 tablet (25 mg total) by mouth daily. 90 tablet 3   Multiple Vitamin (MULTIVITAMIN WITH MINERALS) TABS tablet Take 1 tablet by mouth in the morning. Centrum     nitroGLYCERIN  (NITROSTAT ) 0.4 MG SL tablet Place 1 tablet (0.4 mg total) under the tongue every 5 (five) minutes as needed for chest pain. 30 tablet 0   Omega-3 Fatty Acids (OMEGA 3 PO) Take 400 mg by mouth daily.     pantoprazole  (PROTONIX ) 40 MG tablet Take 1 tablet (40 mg total) by mouth 2 (two) times daily. 60 tablet 11   prednisoLONE  acetate (PRED FORTE ) 1 % ophthalmic suspension Put 6 drops of the steroid in the neti pot and rinse half on each side. 15 mL 2    Probiotic Product (PROBIOTIC PO) Take 1 capsule by mouth in the morning.  silodosin  (RAPAFLO ) 8 MG CAPS capsule Take 1 capsule (8 mg total) by mouth daily with breakfast. 30 capsule 3   silodosin  (RAPAFLO ) 8 MG CAPS capsule Take 1 capsule (8 mg total) by mouth daily with breakfast. 30 capsule 3   sodium chloride  (OCEAN) 0.65 % SOLN nasal spray Place 1 spray into both nostrils 2 (two) times daily.     SYRINGE-NEEDLE, DISP, 3 ML (LUER LOCK SAFETY SYRINGES) 22G X 1 3 ML MISC For testosterone   injections 30 each 1   testosterone  cypionate (DEPOTESTOSTERONE CYPIONATE) 200 MG/ML injection Inject 200 mg into the muscle See admin instructions. Every 10 days     vitamin E 180 MG (400 UNITS) capsule Take 400 Units by mouth in the morning.     ZINC  PICOLINATE PO Take 50 mg by mouth in the morning.     No current facility-administered medications for this visit.   There were no vitals taken for this visit.  PHYSICAL EXAM:  There were no vitals taken for this visit.   Salient findings:  Unable to perform given it was a phone visit  PROCEDURE (prior, not today)  Prior to initiating any procedures, risks/benefits/alternatives were explained to the patient and verbal consent obtained. Diagnostic Nasal Endoscopy Pre-procedure diagnosis: Concern for recurrent acute sinusitis Post-procedure diagnosis: same Indication: See pre-procedure diagnosis and physical exam above Complications: None apparent EBL: 0 mL Anesthesia: Lidocaine  4% and topical decongestant was topically sprayed in each nasal cavity  Description of Procedure:  Patient was identified. A rigid 30 degree endoscope was utilized to evaluate the sinonasal cavities, mucosa, sinus ostia and turbinates and septum.  Overall, signs of mucosal inflammation are noted.  No mucopurulence, polyps, or masses noted.   Right Middle meatus: mild mucosal edema Right SE Recess: clear Left MM: mild mucosal edema Left SE Recess: clear Right septal  deviation, right concha bullosa?   CPT CODE -- 31231 - Mod 25   ASSESSMENT:  69 y.o. with:  1. Nasal septal deviation   2. Hypertrophy of both inferior nasal turbinates   3. Nasal congestion   4. Other acute recurrent sinusitis   5. Other allergic rhinitis    Noted clear septal deviation and persistent symptoms which do bother him with CPAP tolerance when bad. Clear structural issue with significant septal deviation  In addition, he is having 4 sinus infections per year (up to 6) with 4 rounds of abx last year (appears 09/2023, 05/14/2024, 07/04/2024 and 07/2024) with recurrent symptoms despite maximally medical management with INCS, PO antihistamine, and rinses. PO Steroids and abx do help but given his DM, not ideal to take.   As such, we discussed options including septo/turbs/limited FESS  We discussed the goals of septoplasty and turbinate reduction, and expectations for postoperative management. Will plan to leave splints in place, and removal was also discussed. We also discussed nasal obstruction post-operatively until splints in place and pain management.  We discussed R/B/A including pain, infection, bleeding (~2% risk of operative visit for control), persistent symptoms, need for revision surgery, and other risks including damage to surrounding structures, septal perforation, amongst others  We discussed the goals of sinus surgery, and expectations for postoperative management. We discussed R/B/A including pain, infection, bleeding (~2% risk of operative visit for control), persistent symptoms, need for revision surgery, and other risks including damage to the eye and injury to skull base (<1%), anesthetic complications, among others.     Patient understands and is ready to proceed. Will f/u POD 3 given concern re:  restarting CPAP Will message Cards re: holding ASA 81 7 days before surgery F/u POD 3 Continue Allegra, Zyrtec, and Flonase.  Will post -- will need to get cards  clearance to hold ASA 81 1 week before, 1 week after   Thank you for allowing me the opportunity to care for your patient. Please do not hesitate to contact me should you have any other questions.  Sincerely, Eldora Blanch, MD Otolaryngologist (ENT), [facility] Phone: [phone] Fax: [phone]  MDM:  cora - chronic problems,  independent interpretation of CT; decision for surgery  10/16/2024, 8:16 PM

## 2024-10-18 ENCOUNTER — Telehealth (INDEPENDENT_AMBULATORY_CARE_PROVIDER_SITE_OTHER): Payer: Self-pay

## 2024-10-18 NOTE — Telephone Encounter (Signed)
-----   Message from Eldora Blanch, MD sent at 10/16/2024  5:10 PM EST ----- Regarding: pre op clearance Hi Nef,  Can you message Rosaline Bane (cardiology) for pre op clearance for this patient -- hold aspirin  81mg  1 week before surgery, one week after Thanks KP

## 2024-10-18 NOTE — Telephone Encounter (Signed)
 Pre Op Clearance faxed to Cardiology.

## 2024-11-07 ENCOUNTER — Ambulatory Visit: Admitting: Urology

## 2025-01-29 ENCOUNTER — Ambulatory Visit (INDEPENDENT_AMBULATORY_CARE_PROVIDER_SITE_OTHER): Admitting: Otolaryngology

## 2025-02-19 ENCOUNTER — Ambulatory Visit (INDEPENDENT_AMBULATORY_CARE_PROVIDER_SITE_OTHER): Admitting: Otolaryngology

## 2025-03-04 ENCOUNTER — Ambulatory Visit (HOSPITAL_BASED_OUTPATIENT_CLINIC_OR_DEPARTMENT_OTHER): Admit: 2025-03-04 | Admitting: Otolaryngology

## 2025-03-04 ENCOUNTER — Encounter (HOSPITAL_BASED_OUTPATIENT_CLINIC_OR_DEPARTMENT_OTHER): Payer: Self-pay

## 2025-03-04 SURGERY — SEPTOPLASTY, NOSE, WITH NASAL TURBINATE REDUCTION
Anesthesia: General | Laterality: Bilateral

## 2025-03-07 ENCOUNTER — Ambulatory Visit (INDEPENDENT_AMBULATORY_CARE_PROVIDER_SITE_OTHER): Admitting: Otolaryngology
